# Patient Record
Sex: Female | Born: 1955
Health system: Southern US, Community
[De-identification: ages and names within clinical notes are randomized; demographics above are authoritative.]

## PROBLEM LIST (undated history)

## (undated) DIAGNOSIS — Z923 Personal history of irradiation: Secondary | ICD-10-CM

## (undated) DIAGNOSIS — E210 Primary hyperparathyroidism: Secondary | ICD-10-CM

## (undated) DIAGNOSIS — I10 Essential (primary) hypertension: Secondary | ICD-10-CM

## (undated) DIAGNOSIS — R911 Solitary pulmonary nodule: Secondary | ICD-10-CM

## (undated) DIAGNOSIS — R011 Cardiac murmur, unspecified: Secondary | ICD-10-CM

## (undated) DIAGNOSIS — R079 Chest pain, unspecified: Secondary | ICD-10-CM

## (undated) DIAGNOSIS — C50919 Malignant neoplasm of unspecified site of unspecified female breast: Secondary | ICD-10-CM

## (undated) DIAGNOSIS — R319 Hematuria, unspecified: Secondary | ICD-10-CM

## (undated) DIAGNOSIS — Z8489 Family history of other specified conditions: Secondary | ICD-10-CM

## (undated) DIAGNOSIS — I5189 Other ill-defined heart diseases: Secondary | ICD-10-CM

## (undated) DIAGNOSIS — R0600 Dyspnea, unspecified: Secondary | ICD-10-CM

## (undated) DIAGNOSIS — I2510 Atherosclerotic heart disease of native coronary artery without angina pectoris: Secondary | ICD-10-CM

## (undated) DIAGNOSIS — Z7982 Long term (current) use of aspirin: Secondary | ICD-10-CM

## (undated) DIAGNOSIS — I471 Supraventricular tachycardia, unspecified: Secondary | ICD-10-CM

## (undated) DIAGNOSIS — D696 Thrombocytopenia, unspecified: Secondary | ICD-10-CM

## (undated) DIAGNOSIS — I421 Obstructive hypertrophic cardiomyopathy: Secondary | ICD-10-CM

## (undated) DIAGNOSIS — I517 Cardiomegaly: Secondary | ICD-10-CM

## (undated) DIAGNOSIS — R9389 Abnormal findings on diagnostic imaging of other specified body structures: Secondary | ICD-10-CM

## (undated) DIAGNOSIS — R002 Palpitations: Secondary | ICD-10-CM

## (undated) DIAGNOSIS — E7800 Pure hypercholesterolemia, unspecified: Secondary | ICD-10-CM

## (undated) DIAGNOSIS — F419 Anxiety disorder, unspecified: Secondary | ICD-10-CM

## (undated) DIAGNOSIS — N882 Stricture and stenosis of cervix uteri: Secondary | ICD-10-CM

## (undated) DIAGNOSIS — I341 Nonrheumatic mitral (valve) prolapse: Secondary | ICD-10-CM

## (undated) DIAGNOSIS — I4510 Unspecified right bundle-branch block: Secondary | ICD-10-CM

## (undated) DIAGNOSIS — I4729 Other ventricular tachycardia: Secondary | ICD-10-CM

## (undated) DIAGNOSIS — R7989 Other specified abnormal findings of blood chemistry: Secondary | ICD-10-CM

## (undated) HISTORY — DX: Hematuria, unspecified: R31.9

## (undated) HISTORY — DX: Chest pain, unspecified: R07.9

## (undated) HISTORY — DX: Essential (primary) hypertension: I10

## (undated) HISTORY — DX: Cardiomegaly: I51.7

## (undated) HISTORY — DX: Solitary pulmonary nodule: R91.1

## (undated) HISTORY — PX: FACIAL FRACTURE SURGERY: SHX1570

## (undated) HISTORY — DX: Atherosclerotic heart disease of native coronary artery without angina pectoris: I25.10

## (undated) HISTORY — DX: Obstructive hypertrophic cardiomyopathy: I42.1

## (undated) HISTORY — DX: Cardiac murmur, unspecified: R01.1

## (undated) HISTORY — PX: BREAST LUMPECTOMY: SHX2

## (undated) HISTORY — DX: Pure hypercholesterolemia, unspecified: E78.00

## (undated) HISTORY — DX: Palpitations: R00.2

## (undated) HISTORY — PX: NO PAST SURGERIES: SHX2092

---

## 2005-02-27 ENCOUNTER — Ambulatory Visit: Payer: Self-pay | Admitting: Internal Medicine

## 2007-01-01 ENCOUNTER — Ambulatory Visit: Payer: Self-pay | Admitting: Internal Medicine

## 2008-01-12 ENCOUNTER — Ambulatory Visit: Payer: Self-pay | Admitting: Internal Medicine

## 2008-01-15 ENCOUNTER — Ambulatory Visit: Payer: Self-pay | Admitting: Internal Medicine

## 2009-03-07 ENCOUNTER — Ambulatory Visit: Payer: Self-pay | Admitting: Internal Medicine

## 2009-04-11 ENCOUNTER — Ambulatory Visit: Payer: Self-pay | Admitting: Internal Medicine

## 2010-03-21 ENCOUNTER — Ambulatory Visit: Payer: Self-pay | Admitting: Internal Medicine

## 2010-04-02 ENCOUNTER — Ambulatory Visit: Payer: Self-pay | Admitting: Internal Medicine

## 2010-11-27 ENCOUNTER — Ambulatory Visit: Payer: Self-pay | Admitting: Internal Medicine

## 2011-03-26 ENCOUNTER — Ambulatory Visit: Payer: Self-pay | Admitting: Internal Medicine

## 2012-06-19 ENCOUNTER — Other Ambulatory Visit: Payer: Self-pay | Admitting: Internal Medicine

## 2012-06-19 MED ORDER — HYDROCHLOROTHIAZIDE 25 MG PO TABS: 25.0000 mg | ORAL_TABLET | Freq: Every day | ORAL | Status: DC

## 2012-06-19 MED ORDER — SERTRALINE HCL 25 MG PO TABS: 25.0000 mg | ORAL_TABLET | Freq: Every day | ORAL | Status: DC

## 2012-06-19 NOTE — Telephone Encounter (Signed)
Sent in to pharmacy.  

## 2012-06-19 NOTE — Telephone Encounter (Signed)
Pt is needing refill on Zoloft 25 mg and hydrcholothorizide 25 mg. She uses [facility] in [place]. Pt is completely out of meds. Pt says pharmacy says they have tried contacting us twice already.

## 2012-08-31 ENCOUNTER — Encounter: Payer: Self-pay | Admitting: *Deleted

## 2012-09-01 ENCOUNTER — Ambulatory Visit (INDEPENDENT_AMBULATORY_CARE_PROVIDER_SITE_OTHER): Payer: No Typology Code available for payment source | Admitting: Internal Medicine

## 2012-09-01 ENCOUNTER — Other Ambulatory Visit (HOSPITAL_COMMUNITY)
Admission: RE | Admit: 2012-09-01 | Discharge: 2012-09-01 | Disposition: A | Discharge status: Home / Self Care | Payer: No Typology Code available for payment source | Source: Ambulatory Visit | Attending: Internal Medicine | Admitting: Internal Medicine

## 2012-09-01 ENCOUNTER — Encounter: Payer: Self-pay | Admitting: Internal Medicine

## 2012-09-01 VITALS — BP 122/74 | HR 81 | Temp 98.7°F | Ht 64.16 in | Wt 170.2 lb

## 2012-09-01 DIAGNOSIS — E7800 Pure hypercholesterolemia, unspecified: Secondary | ICD-10-CM

## 2012-09-01 DIAGNOSIS — Z01419 Encounter for gynecological examination (general) (routine) without abnormal findings: Secondary | ICD-10-CM | POA: Insufficient documentation

## 2012-09-01 DIAGNOSIS — I10 Essential (primary) hypertension: Secondary | ICD-10-CM

## 2012-09-01 DIAGNOSIS — Z1151 Encounter for screening for human papillomavirus (HPV): Secondary | ICD-10-CM | POA: Insufficient documentation

## 2012-09-01 MED ORDER — HYDROCHLOROTHIAZIDE 25 MG PO TABS: 25.0000 mg | ORAL_TABLET | Freq: Every day | ORAL | Status: DC

## 2012-09-01 MED ORDER — SERTRALINE HCL 25 MG PO TABS: 25.0000 mg | ORAL_TABLET | Freq: Every day | ORAL | Status: DC

## 2012-09-09 ENCOUNTER — Encounter: Payer: Self-pay | Admitting: Internal Medicine

## 2012-09-11 ENCOUNTER — Other Ambulatory Visit (INDEPENDENT_AMBULATORY_CARE_PROVIDER_SITE_OTHER): Payer: No Typology Code available for payment source

## 2012-09-11 DIAGNOSIS — R5383 Other fatigue: Secondary | ICD-10-CM

## 2012-09-11 DIAGNOSIS — R5381 Other malaise: Secondary | ICD-10-CM

## 2012-09-11 DIAGNOSIS — E7800 Pure hypercholesterolemia, unspecified: Secondary | ICD-10-CM

## 2012-09-11 LAB — CBC WITH DIFFERENTIAL/PLATELET
Basophils Relative: 0.7 % (ref 0.0–3.0)
Eosinophils Absolute: 0.1 10*3/uL (ref 0.0–0.7)
HCT: 43 % (ref 36.0–46.0)
Hemoglobin: 14.8 g/dL (ref 12.0–15.0)
Lymphs Abs: 3 10*3/uL (ref 0.7–4.0)
MCHC: 34.3 g/dL (ref 30.0–36.0)
MCV: 88 fl (ref 78.0–100.0)
Monocytes Absolute: 0.5 10*3/uL (ref 0.1–1.0)
Neutro Abs: 5.2 10*3/uL (ref 1.4–7.7)
Neutrophils Relative %: 59.1 % (ref 43.0–77.0)
RBC: 4.89 Mil/uL (ref 3.87–5.11)

## 2012-09-11 LAB — COMPREHENSIVE METABOLIC PANEL
AST: 23 U/L (ref 0–37)
Alkaline Phosphatase: 81 U/L (ref 39–117)
BUN: 13 mg/dL (ref 6–23)
Creatinine, Ser: 0.9 mg/dL (ref 0.4–1.2)

## 2012-09-11 LAB — TSH: TSH: 1.46 u[IU]/mL (ref 0.35–5.50)

## 2012-09-11 LAB — LIPID PANEL
Cholesterol: 187 mg/dL (ref 0–200)
LDL Cholesterol: 129 mg/dL — ABNORMAL HIGH (ref 0–99)
Triglycerides: 119 mg/dL (ref 0.0–149.0)
VLDL: 23.8 mg/dL (ref 0.0–40.0)

## 2012-09-16 ENCOUNTER — Ambulatory Visit: Payer: Self-pay | Admitting: Internal Medicine

## 2012-09-17 ENCOUNTER — Other Ambulatory Visit: Payer: Self-pay | Admitting: Internal Medicine

## 2012-09-17 DIAGNOSIS — E7800 Pure hypercholesterolemia, unspecified: Secondary | ICD-10-CM

## 2012-09-17 DIAGNOSIS — R739 Hyperglycemia, unspecified: Secondary | ICD-10-CM

## 2012-09-17 DIAGNOSIS — I10 Essential (primary) hypertension: Secondary | ICD-10-CM

## 2012-09-17 NOTE — Progress Notes (Signed)
Order placed for follow up labs 

## 2012-09-18 ENCOUNTER — Telehealth: Payer: Self-pay | Admitting: Internal Medicine

## 2012-09-18 NOTE — Telephone Encounter (Signed)
Patient called stating that the pharmacy did not receive her medication refill. She is wanting them refilled to day.  sertraline (ZOLOFT) 25 MG tablet   hydrochlorothiazide (HYDRODIURIL) 25 MG tablet

## 2012-09-18 NOTE — Telephone Encounter (Signed)
Called pharmacy who stated that they have received rx request. Patient notified.

## 2012-10-01 ENCOUNTER — Encounter: Payer: Self-pay | Admitting: Internal Medicine

## 2012-10-07 NOTE — Telephone Encounter (Signed)
Please close encounter

## 2012-10-11 ENCOUNTER — Encounter: Payer: Self-pay | Admitting: Internal Medicine

## 2012-10-11 DIAGNOSIS — E7800 Pure hypercholesterolemia, unspecified: Secondary | ICD-10-CM | POA: Insufficient documentation

## 2012-10-11 DIAGNOSIS — R319 Hematuria, unspecified: Secondary | ICD-10-CM | POA: Insufficient documentation

## 2012-10-11 DIAGNOSIS — I10 Essential (primary) hypertension: Secondary | ICD-10-CM | POA: Insufficient documentation

## 2012-10-11 NOTE — Assessment & Plan Note (Signed)
Blood pressure as outlined.  Doing well.  Continue current med regimen.  Check metabolic panel.   

## 2012-10-11 NOTE — Assessment & Plan Note (Signed)
Low cholesterol diet and exercise.  Check lipid panel.   

## 2012-10-11 NOTE — Progress Notes (Signed)
  Subjective:    [name], female    DOB: 03-12-1956, 57 y.o.   MRN: [id number]  HPI 57 year old female with past history of hematuria, hypercholesterolemia and hypertension who comes in today to follow up on these issues as well as for a complete physical exam.  States she is doing relatively well.  Handling stress relatively well.  On Sertraline.  Does not feel she needs anything more. Tries to stay active.  No cardiac symptoms with increased activity or exertion.  No nausea or vomiting. No abdominal pain.  Eating and drinking well.  Taking her meds regularly.    Past Medical History  Diagnosis Date  . Hypercholesterolemia   . Hypertension   . Hematuria     Review of Systems Patient denies any headache, lightheadedness or dizziness.  States she previously noticed a sudden pressure in her right ear.  Not bothering her now.  No ear pain.  No significant sinus or allergy symptoms.   No chest pain, tightness or palpitations.  No increased shortness of breath, cough or congestion.  No nausea or vomiting.  No abdominal pain or cramping.  No bowel change, such as diarrhea, constipation, BRBPR or melana.  No urine change.  Feels she is handling stress relatively well.  On sertraline. Does not feels she needs anything more.      Objective:   Physical Exam Filed Vitals:   09/01/12 1431  BP: 122/74  Pulse: 81  Temp: 98.7 F (28.70 C)   57 year old female in no acute distress.   HEENT:  Nares- clear.  Oropharynx - without lesions.   TMs visualized - without erythema.  NECK:  Supple.  Nontender.  No audible bruit.  HEART:  Appears to be regular. LUNGS:  No crackles or wheezing audible.  Respirations even and unlabored.  RADIAL PULSE:  Equal bilaterally.    BREASTS:  No nipple discharge or nipple retraction present.  Could not appreciate any distinct nodules or axillary adenopathy.  ABDOMEN:  Soft, nontender.  Bowel sounds present and normal.  No audible abdominal bruit.  GU:   Normal external genitalia.  Vaginal vault without lesions.  Cervix identified.  Pap performed. Could not appreciate any adnexal masses or tenderness.   RECTAL:  Heme negative.   EXTREMITIES:  No increased edema present.  DP pulses palpable and equal bilaterally.           Assessment & Plan:  PREVIOUS EAR PRESSURE.  Resolved.  No abnormality noted on exam.  Follow.   INCREASED PSYCHOSOCIAL STRESSORS.  Feels she is handling things relatively well.  On Sertraline.  Desires not change or any further intervention.  Follow.   CARDIOVASCULAR.  Asymptomatic.   HEALTH MAINTENANCE.  Physical today.  Schedule mammogram.  Will notify me when agreeable for colonoscopy.  Discussed with her today. IFOB.

## 2012-10-11 NOTE — Assessment & Plan Note (Signed)
Worked up by Dr Wolff at [facility].  Last visit there recommended follow up prn.  He reported urine negative.  Follow urinalysis.      

## 2013-03-14 ENCOUNTER — Other Ambulatory Visit: Payer: Self-pay | Admitting: Internal Medicine

## 2013-03-14 MED ORDER — HYDROCHLOROTHIAZIDE 25 MG PO TABS: 25.0000 mg | ORAL_TABLET | Freq: Every day | ORAL | Status: DC

## 2013-03-14 MED ORDER — SERTRALINE HCL 25 MG PO TABS: 25.0000 mg | ORAL_TABLET | Freq: Every day | ORAL | Status: DC

## 2013-03-14 NOTE — Progress Notes (Signed)
Refilled hctz #30 with 5 refills.  Refilled sertraline #30 with 5 refills.

## 2013-03-30 ENCOUNTER — Other Ambulatory Visit (INDEPENDENT_AMBULATORY_CARE_PROVIDER_SITE_OTHER): Payer: No Typology Code available for payment source

## 2013-03-30 DIAGNOSIS — E7800 Pure hypercholesterolemia, unspecified: Secondary | ICD-10-CM

## 2013-03-30 DIAGNOSIS — R7309 Other abnormal glucose: Secondary | ICD-10-CM

## 2013-03-30 DIAGNOSIS — R739 Hyperglycemia, unspecified: Secondary | ICD-10-CM

## 2013-03-30 DIAGNOSIS — I10 Essential (primary) hypertension: Secondary | ICD-10-CM

## 2013-03-30 DIAGNOSIS — R319 Hematuria, unspecified: Secondary | ICD-10-CM

## 2013-03-30 LAB — BASIC METABOLIC PANEL
BUN: 16 mg/dL (ref 6–23)
CO2: 28 mEq/L (ref 19–32)
Calcium: 9.8 mg/dL (ref 8.4–10.5)
Creatinine, Ser: 0.9 mg/dL (ref 0.4–1.2)
Glucose, Bld: 100 mg/dL — ABNORMAL HIGH (ref 70–99)
Sodium: 141 mEq/L (ref 135–145)

## 2013-03-30 LAB — URINALYSIS, ROUTINE W REFLEX MICROSCOPIC
Nitrite: NEGATIVE
Specific Gravity, Urine: 1.025 (ref 1.000–1.030)
Total Protein, Urine: NEGATIVE

## 2013-03-30 LAB — LIPID PANEL: HDL: 37.5 mg/dL — ABNORMAL LOW (ref >39.00)

## 2013-03-31 ENCOUNTER — Encounter: Payer: Self-pay | Admitting: *Deleted

## 2013-03-31 ENCOUNTER — Other Ambulatory Visit: Payer: Self-pay | Admitting: Internal Medicine

## 2013-03-31 DIAGNOSIS — R319 Hematuria, unspecified: Secondary | ICD-10-CM

## 2013-03-31 NOTE — Progress Notes (Signed)
Order placed for f/u urinalysis 

## 2013-08-17 ENCOUNTER — Telehealth: Payer: Self-pay | Admitting: Internal Medicine

## 2013-08-17 NOTE — Telephone Encounter (Signed)
She can try mucinex DM in the am and Robitussin DM in the evening.  This should help with the congestion and the cough.  Saline nasal spray - flush nose at least 2-3x/day.  Can also call in flonase nasal spray - 2 sprays each nostril q day (do this in the evening).  If persistent symptoms, will need to be evaluated.

## 2013-08-17 NOTE — Telephone Encounter (Signed)
Pt notified & stated that she already has some Flonase on hand

## 2013-08-17 NOTE — Telephone Encounter (Signed)
Patient Information:  Caller Name: Skila  Phone: [phone]  Patient: Mary Walter  Gender: Female  DOB: 1955/11/09  Age: 58 Years  PCP: Einar Pheasant  Office Follow Up:  Does the office need to follow up with this patient?: Yes  Instructions For The Office: Please contact patient to give further instructions.  RN Note:  Patient reports she cannot get into the office today. She does not have transportation. She would like to know if there is anything that can be called in for her symptoms. Please contact patient at the number provided.  Symptoms  Reason For Call & Symptoms: Sinus pain/pressure, dry cough, eyes are painful at times.  Reviewed Health History In EMR: Yes  Reviewed Medications In EMR: Yes  Reviewed Allergies In EMR: Yes  Reviewed Surgeries / Procedures: Yes  Date of Onset of Symptoms: 08/14/2013  Treatments Tried: Nasal decongestant and sinus  Treatments Tried Worked: No  Guideline(s) Used:  Sinus Pain and Congestion  Disposition Per Guideline:   See Today or Tomorrow in Office  Reason For Disposition Reached:   Patient wants to be seen  Advice Given:  N/A  Patient Refused Recommendation:  Patient Requests Prescription  Patient has no transportation to office today.

## 2013-08-17 NOTE — Telephone Encounter (Signed)
Please advise 

## 2013-09-02 ENCOUNTER — Ambulatory Visit (INDEPENDENT_AMBULATORY_CARE_PROVIDER_SITE_OTHER): Payer: No Typology Code available for payment source | Admitting: Internal Medicine

## 2013-09-02 ENCOUNTER — Encounter: Payer: Self-pay | Admitting: Internal Medicine

## 2013-09-02 ENCOUNTER — Other Ambulatory Visit (HOSPITAL_COMMUNITY)
Admission: RE | Admit: 2013-09-02 | Discharge: 2013-09-02 | Disposition: A | Discharge status: Home / Self Care | Payer: No Typology Code available for payment source | Source: Ambulatory Visit | Attending: Internal Medicine | Admitting: Internal Medicine

## 2013-09-02 VITALS — BP 120/70 | HR 70 | Temp 98.5°F | Ht 65.0 in | Wt 171.8 lb

## 2013-09-02 DIAGNOSIS — Z1151 Encounter for screening for human papillomavirus (HPV): Secondary | ICD-10-CM | POA: Insufficient documentation

## 2013-09-02 DIAGNOSIS — E7800 Pure hypercholesterolemia, unspecified: Secondary | ICD-10-CM

## 2013-09-02 DIAGNOSIS — Z124 Encounter for screening for malignant neoplasm of cervix: Secondary | ICD-10-CM

## 2013-09-02 DIAGNOSIS — I10 Essential (primary) hypertension: Secondary | ICD-10-CM

## 2013-09-02 DIAGNOSIS — R319 Hematuria, unspecified: Secondary | ICD-10-CM

## 2013-09-02 DIAGNOSIS — Z01419 Encounter for gynecological examination (general) (routine) without abnormal findings: Secondary | ICD-10-CM | POA: Insufficient documentation

## 2013-09-02 DIAGNOSIS — Z1239 Encounter for other screening for malignant neoplasm of breast: Secondary | ICD-10-CM

## 2013-09-02 DIAGNOSIS — Z1211 Encounter for screening for malignant neoplasm of colon: Secondary | ICD-10-CM

## 2013-09-02 MED ORDER — AZITHROMYCIN 250 MG PO TABS: ORAL_TABLET | ORAL | Status: DC

## 2013-09-02 NOTE — Progress Notes (Signed)
Subjective:    [name], female    DOB: 01/23/1956, 58 y.o.   MRN: [id number]  HPI 58 year old female with past history of hematuria, hypercholesterolemia and hypertension who comes in today to follow up on these issues as well as for a complete physical exam.  States she is doing relatively well.  Handling stress relatively well.  On Sertraline.  Does not feel she needs anything more. Tries to stay active.  No cardiac symptoms with increased activity or exertion.  No nausea or vomiting. No abdominal pain.  Eating and drinking well.  Taking her meds regularly.  Has been having some increased sinus pressure and cough/congestion.  Clear mucus.  Better now.  Taking some mucinex.    Past Medical History  Diagnosis Date  . Hypercholesterolemia   . Hypertension   . Hematuria     Current Outpatient Prescriptions on File Prior to Visit  Medication Sig Dispense Refill  . hydrochlorothiazide (HYDRODIURIL) 25 MG tablet Take 1 tablet (25 mg total) by mouth daily.  30 tablet  5  . Multiple Vitamin (MULTIVITAMIN) tablet Take 1 tablet by mouth daily.      . sertraline (ZOLOFT) 25 MG tablet Take 1 tablet (25 mg total) by mouth daily.  30 tablet  5   No current facility-administered medications on file prior to visit.    Review of Systems Patient denies any headache, lightheadedness or dizziness.  Some sinus congestion as outlined.  Clear mucus.  Better.  Using mucinex.  No ear pain.  No chest pain, tightness or palpitations.  No increased shortness of breath, cough or congestion.  No nausea or vomiting.  No abdominal pain or cramping.  No bowel change, such as diarrhea, constipation, BRBPR or melana.  No urine change.  Feels she is handling stress relatively well.  On sertraline.  Does not feels she needs anything more.      Objective:   Physical Exam  Filed Vitals:   09/02/13 0832  BP: 120/70  Pulse: 70  Temp: 98.5 F (38.59 C)   58 year old female in no acute distress.    HEENT:  Nares- clear.  Oropharynx - without lesions.   TMs visualized - without erythema.  NECK:  Supple.  Nontender.  No audible bruit.  HEART:  Appears to be regular. LUNGS:  No crackles or wheezing audible.  Respirations even and unlabored.  RADIAL PULSE:  Equal bilaterally.    BREASTS:  No nipple discharge or nipple retraction present.  Could not appreciate any distinct nodules or axillary adenopathy.  ABDOMEN:  Soft, nontender.  Bowel sounds present and normal.  No audible abdominal bruit.  GU:  Normal external genitalia.  Vaginal vault without lesions.  Cervix identified.  Pap performed. Could not appreciate any adnexal masses or tenderness.   RECTAL:  Heme negative.   EXTREMITIES:  No increased edema present.  DP pulses palpable and equal bilaterally.           Assessment & Plan:  PREVIOUS EAR PRESSURE.  Resolved.  No abnormality noted on exam.  Follow.   SINUS CONGESTION.  Mucinex as outlined.  Saline nasal flushes as directed.  Follow.  Do not feel abx warranted at this time.  Follow.    INCREASED PSYCHOSOCIAL STRESSORS.  Feels she is handling things relatively well.  On Sertraline.  Desires no further intervention at this time.    Follow.   CARDIOVASCULAR.  Asymptomatic.   HEALTH MAINTENANCE.  Physical today.  Schedule mammogram.  Will notify me when agreeable for colonoscopy.  Discussed with her today. IFOB.

## 2013-09-02 NOTE — Progress Notes (Signed)
Pre-visit discussion using our clinic review tool. No additional management support is needed unless otherwise documented below in the visit note.  

## 2013-09-05 ENCOUNTER — Encounter: Payer: Self-pay | Admitting: Internal Medicine

## 2013-09-05 NOTE — Assessment & Plan Note (Signed)
Worked up by Dr Yves Dill at [facility].  Last visit there recommended follow up prn.  He reported urine negative.  Follow urinalysis.  Check urinalysis with next labs.

## 2013-09-05 NOTE — Assessment & Plan Note (Signed)
Low cholesterol diet and exercise.  Follow lipid panel.   

## 2013-09-05 NOTE — Assessment & Plan Note (Signed)
Blood pressure as outlined.  Doing well.  Continue current med regimen.  Check metabolic panel.   

## 2013-09-06 ENCOUNTER — Encounter: Payer: Self-pay | Admitting: *Deleted

## 2013-09-16 ENCOUNTER — Encounter: Payer: Self-pay | Admitting: Emergency Medicine

## 2013-09-17 ENCOUNTER — Other Ambulatory Visit (INDEPENDENT_AMBULATORY_CARE_PROVIDER_SITE_OTHER): Payer: No Typology Code available for payment source

## 2013-09-17 DIAGNOSIS — E7800 Pure hypercholesterolemia, unspecified: Secondary | ICD-10-CM

## 2013-09-17 DIAGNOSIS — R319 Hematuria, unspecified: Secondary | ICD-10-CM

## 2013-09-17 DIAGNOSIS — I10 Essential (primary) hypertension: Secondary | ICD-10-CM

## 2013-09-17 LAB — URINALYSIS, ROUTINE W REFLEX MICROSCOPIC
Bilirubin Urine: NEGATIVE
Ketones, ur: NEGATIVE
Nitrite: NEGATIVE
PH: 7 (ref 5.0–8.0)
Specific Gravity, Urine: 1.015 (ref 1.000–1.030)
Total Protein, Urine: NEGATIVE
URINE GLUCOSE: NEGATIVE
UROBILINOGEN UA: 0.2 (ref 0.0–1.0)

## 2013-09-17 LAB — COMPREHENSIVE METABOLIC PANEL
ALT: 20 U/L (ref 0–35)
AST: 19 U/L (ref 0–37)
Albumin: 4 g/dL (ref 3.5–5.2)
Alkaline Phosphatase: 75 U/L (ref 39–117)
BILIRUBIN TOTAL: 0.6 mg/dL (ref 0.3–1.2)
BUN: 15 mg/dL (ref 6–23)
CO2: 27 mEq/L (ref 19–32)
CREATININE: 0.9 mg/dL (ref 0.4–1.2)
Calcium: 10.2 mg/dL (ref 8.4–10.5)
Chloride: 109 mEq/L (ref 96–112)
GFR: 69.04 mL/min (ref >60.00)
Glucose, Bld: 91 mg/dL (ref 70–99)
Potassium: 4 mEq/L (ref 3.5–5.1)
SODIUM: 143 meq/L (ref 135–145)
Total Protein: 6.3 g/dL (ref 6.0–8.3)

## 2013-09-17 LAB — CBC WITH DIFFERENTIAL/PLATELET
Basophils Absolute: 0.1 10*3/uL (ref 0.0–0.1)
Basophils Relative: 0.9 % (ref 0.0–3.0)
EOS PCT: 1.9 % (ref 0.0–5.0)
Eosinophils Absolute: 0.1 10*3/uL (ref 0.0–0.7)
HCT: 40.7 % (ref 36.0–46.0)
Hemoglobin: 13.9 g/dL (ref 12.0–15.0)
Lymphocytes Relative: 40.9 % (ref 12.0–46.0)
Lymphs Abs: 2.6 10*3/uL (ref 0.7–4.0)
MCHC: 34.1 g/dL (ref 30.0–36.0)
MCV: 89.4 fl (ref 78.0–100.0)
MONOS PCT: 6.8 % (ref 3.0–12.0)
Monocytes Absolute: 0.4 10*3/uL (ref 0.1–1.0)
NEUTROS PCT: 49.5 % (ref 43.0–77.0)
Neutro Abs: 3.2 10*3/uL (ref 1.4–7.7)
Platelets: 220 10*3/uL (ref 150.0–400.0)
RBC: 4.55 Mil/uL (ref 3.87–5.11)
RDW: 13.2 % (ref 11.5–14.6)
WBC: 6.4 10*3/uL (ref 4.5–10.5)

## 2013-09-17 LAB — LIPID PANEL
CHOL/HDL RATIO: 5
Cholesterol: 200 mg/dL (ref 0–200)
HDL: 40.8 mg/dL (ref >39.00)
LDL Cholesterol: 128 mg/dL — ABNORMAL HIGH (ref 0–99)
TRIGLYCERIDES: 156 mg/dL — AB (ref 0.0–149.0)
VLDL: 31.2 mg/dL (ref 0.0–40.0)

## 2013-09-17 LAB — TSH: TSH: 1.92 u[IU]/mL (ref 0.35–5.50)

## 2013-09-30 ENCOUNTER — Ambulatory Visit: Payer: Self-pay | Admitting: Internal Medicine

## 2013-09-30 LAB — HM MAMMOGRAPHY

## 2013-10-06 ENCOUNTER — Telehealth: Payer: Self-pay | Admitting: *Deleted

## 2013-10-06 NOTE — Telephone Encounter (Signed)
Pt called requesting lab results from 09/17/13. Pt notified of results & made aware that we spoke with husband on 3/30 & asked him to have her call back. Copy of labs also mailed to patient & patient declined referral at this time.

## 2013-10-11 ENCOUNTER — Other Ambulatory Visit: Payer: Self-pay | Admitting: Internal Medicine

## 2013-11-19 ENCOUNTER — Other Ambulatory Visit: Payer: Self-pay | Admitting: Internal Medicine

## 2014-01-12 ENCOUNTER — Other Ambulatory Visit: Payer: Self-pay | Admitting: Internal Medicine

## 2014-01-12 NOTE — Telephone Encounter (Signed)
Last refill 6.24.15, last OV 3.12.15, next Ov 9.15.15.  Please advise refill.

## 2014-01-12 NOTE — Telephone Encounter (Signed)
Refilled zoloft #30 with 2 refills.   

## 2014-03-08 ENCOUNTER — Ambulatory Visit (INDEPENDENT_AMBULATORY_CARE_PROVIDER_SITE_OTHER): Payer: No Typology Code available for payment source | Admitting: Internal Medicine

## 2014-03-08 ENCOUNTER — Encounter: Payer: Self-pay | Admitting: Internal Medicine

## 2014-03-08 VITALS — BP 110/80 | HR 78 | Temp 98.8°F | Ht 65.0 in | Wt 170.5 lb

## 2014-03-08 DIAGNOSIS — Z23 Encounter for immunization: Secondary | ICD-10-CM

## 2014-03-08 DIAGNOSIS — R319 Hematuria, unspecified: Secondary | ICD-10-CM

## 2014-03-08 DIAGNOSIS — H00013 Hordeolum externum right eye, unspecified eyelid: Secondary | ICD-10-CM

## 2014-03-08 DIAGNOSIS — Z733 Stress, not elsewhere classified: Secondary | ICD-10-CM

## 2014-03-08 DIAGNOSIS — E7800 Pure hypercholesterolemia, unspecified: Secondary | ICD-10-CM

## 2014-03-08 DIAGNOSIS — I10 Essential (primary) hypertension: Secondary | ICD-10-CM

## 2014-03-08 DIAGNOSIS — F439 Reaction to severe stress, unspecified: Secondary | ICD-10-CM

## 2014-03-08 DIAGNOSIS — H00019 Hordeolum externum unspecified eye, unspecified eyelid: Secondary | ICD-10-CM

## 2014-03-08 LAB — LIPID PANEL
CHOL/HDL RATIO: 5
Cholesterol: 180 mg/dL (ref 0–200)
HDL: 36.9 mg/dL — ABNORMAL LOW (ref >39.00)
LDL Cholesterol: 126 mg/dL — ABNORMAL HIGH (ref 0–99)
NonHDL: 143.1
Triglycerides: 85 mg/dL (ref 0.0–149.0)
VLDL: 17 mg/dL (ref 0.0–40.0)

## 2014-03-08 LAB — COMPREHENSIVE METABOLIC PANEL
ALBUMIN: 4 g/dL (ref 3.5–5.2)
ALK PHOS: 74 U/L (ref 39–117)
ALT: 20 U/L (ref 0–35)
AST: 24 U/L (ref 0–37)
BUN: 13 mg/dL (ref 6–23)
CO2: 27 mEq/L (ref 19–32)
Calcium: 10.4 mg/dL (ref 8.4–10.5)
Chloride: 107 mEq/L (ref 96–112)
Creatinine, Ser: 0.9 mg/dL (ref 0.4–1.2)
GFR: 70.76 mL/min (ref >60.00)
Glucose, Bld: 85 mg/dL (ref 70–99)
POTASSIUM: 3.7 meq/L (ref 3.5–5.1)
SODIUM: 142 meq/L (ref 135–145)
Total Bilirubin: 0.8 mg/dL (ref 0.2–1.2)
Total Protein: 6.8 g/dL (ref 6.0–8.3)

## 2014-03-08 NOTE — Progress Notes (Signed)
  Subjective:    [name], female    DOB: May 01, 1956, 58 y.o.   MRN: [id number]  HPI 58 year old female with past history of hematuria, hypercholesterolemia and hypertension who comes in today for a scheduled follow up.  States she is doing relatively well.  Handling stress relatively well.  On Sertraline.  Does not feel she needs anything more. Tries to stay active.  No cardiac symptoms with increased activity or exertion.  We discussed diet and exercise.   No nausea or vomiting. No abdominal pain.  Eating and drinking well.  Taking her meds regularly.  Had some sinus issues over the summer.  These resolved.  Has a sty - left eye lid.  Using warm compresses.  Helping.     Past Medical History  Diagnosis Date  . Hypercholesterolemia   . Hypertension   . Hematuria     Current Outpatient Prescriptions on File Prior to Visit  Medication Sig Dispense Refill  . hydrochlorothiazide (HYDRODIURIL) 25 MG tablet TAKE 1 TABLET (25 MG TOTAL) BY MOUTH DAILY.  30 tablet  5  . Multiple Vitamin (MULTIVITAMIN) tablet Take 1 tablet by mouth daily.      . sertraline (ZOLOFT) 25 MG tablet TAKE 1 TABLET BY MOUTH EVERY DAY  30 tablet  2   No current facility-administered medications on file prior to visit.    Review of Systems Patient denies any headache, lightheadedness or dizziness.  Sinus congestion and pressure better.  Sty left eye as outlined.  No chest pain, tightness or palpitations.  No increased shortness of breath, cough or congestion.  No nausea or vomiting.  No abdominal pain or cramping.  No bowel change, such as diarrhea, constipation, BRBPR or melana.  No urine change.  Feels she is handling stress relatively well.  On sertraline.  Does not feels she needs anything more.      Objective:   Physical Exam  Filed Vitals:   03/08/14 0847  BP: 110/80  Pulse: 78  Temp: 98.8 F (51.83 C)   58 year old female in no acute distress.   HEENT:  Nares- clear.  Oropharynx - without  lesions.     NECK:  Supple.  Nontender.  No audible bruit.  HEART:  Appears to be regular. LUNGS:  No crackles or wheezing audible.  Respirations even and unlabored.  RADIAL PULSE:  Equal bilaterally.    ABDOMEN:  Soft, nontender.  Bowel sounds present and normal.  No audible abdominal bruit.    EXTREMITIES:  No increased edema present.  DP pulses palpable and equal bilaterally.           Assessment & Plan:  INCREASED PSYCHOSOCIAL STRESSORS.  Feels she is handling things relatively well.  On Sertraline.  Desires no further intervention at this time.    Follow.   CARDIOVASCULAR.  Asymptomatic.   HEALTH MAINTENANCE.  Physical 09/02/13.  Pap at physical negative with negative HPV.  Will notify me when agreeable for colonoscopy.  Discussed with her today.  Has not returned IFOB.  Mammogram 09/30/13 - Birads I.

## 2014-03-08 NOTE — Progress Notes (Signed)
Pre visit review using our clinic review tool, if applicable. No additional management support is needed unless otherwise documented below in the visit note. 

## 2014-03-09 ENCOUNTER — Encounter: Payer: Self-pay | Admitting: *Deleted

## 2014-03-13 ENCOUNTER — Encounter: Payer: Self-pay | Admitting: Internal Medicine

## 2014-03-13 DIAGNOSIS — H00019 Hordeolum externum unspecified eye, unspecified eyelid: Secondary | ICD-10-CM | POA: Insufficient documentation

## 2014-03-13 DIAGNOSIS — F439 Reaction to severe stress, unspecified: Secondary | ICD-10-CM | POA: Insufficient documentation

## 2014-03-13 NOTE — Assessment & Plan Note (Signed)
Low cholesterol diet and exercise.  Follow lipid panel.   

## 2014-03-13 NOTE — Assessment & Plan Note (Signed)
Increased stress.  On zoloft.  Feels she is handling things relatively well.  Does not feel she needs any further intervention.  Follow.

## 2014-03-13 NOTE — Assessment & Plan Note (Signed)
Worked up by Dr Yves Dill at [facility].  Last visit there recommended follow up prn.  He reported urine negative.  Follow urinalysis.

## 2014-03-13 NOTE — Assessment & Plan Note (Signed)
Blood pressure as outlined.  Doing well.  Continue current med regimen.  Check metabolic panel.

## 2014-03-13 NOTE — Assessment & Plan Note (Signed)
Right eyelid sty.  Warm compresses.  Follow.

## 2014-04-13 ENCOUNTER — Other Ambulatory Visit: Payer: Self-pay | Admitting: Internal Medicine

## 2014-07-19 ENCOUNTER — Other Ambulatory Visit: Payer: Self-pay | Admitting: Internal Medicine

## 2014-07-19 NOTE — Telephone Encounter (Signed)
Last OV 9.15.15, last refill 12.28.15, next OX 3.15.16.  Please advise refill

## 2014-07-19 NOTE — Telephone Encounter (Signed)
Refilled zoloft #30 with 2 refills.

## 2014-09-06 ENCOUNTER — Ambulatory Visit (INDEPENDENT_AMBULATORY_CARE_PROVIDER_SITE_OTHER): Payer: Managed Care, Other (non HMO) | Admitting: Internal Medicine

## 2014-09-06 ENCOUNTER — Encounter: Payer: Self-pay | Admitting: Internal Medicine

## 2014-09-06 ENCOUNTER — Other Ambulatory Visit (HOSPITAL_COMMUNITY)
Admission: RE | Admit: 2014-09-06 | Discharge: 2014-09-06 | Disposition: A | Discharge status: Home / Self Care | Payer: Managed Care, Other (non HMO) | Source: Ambulatory Visit | Attending: Internal Medicine | Admitting: Internal Medicine

## 2014-09-06 VITALS — BP 110/70 | HR 69 | Temp 98.9°F | Ht 66.0 in | Wt 176.0 lb

## 2014-09-06 DIAGNOSIS — Z658 Other specified problems related to psychosocial circumstances: Secondary | ICD-10-CM | POA: Diagnosis not present

## 2014-09-06 DIAGNOSIS — Z01419 Encounter for gynecological examination (general) (routine) without abnormal findings: Secondary | ICD-10-CM | POA: Diagnosis present

## 2014-09-06 DIAGNOSIS — R319 Hematuria, unspecified: Secondary | ICD-10-CM | POA: Diagnosis not present

## 2014-09-06 DIAGNOSIS — Z1151 Encounter for screening for human papillomavirus (HPV): Secondary | ICD-10-CM | POA: Diagnosis present

## 2014-09-06 DIAGNOSIS — E7800 Pure hypercholesterolemia, unspecified: Secondary | ICD-10-CM

## 2014-09-06 DIAGNOSIS — F439 Reaction to severe stress, unspecified: Secondary | ICD-10-CM

## 2014-09-06 DIAGNOSIS — Z0000 Encounter for general adult medical examination without abnormal findings: Secondary | ICD-10-CM | POA: Insufficient documentation

## 2014-09-06 DIAGNOSIS — I10 Essential (primary) hypertension: Secondary | ICD-10-CM

## 2014-09-06 LAB — COMPREHENSIVE METABOLIC PANEL
ALK PHOS: 75 U/L (ref 39–117)
ALT: 21 U/L (ref 0–35)
AST: 21 U/L (ref 0–37)
Albumin: 4.5 g/dL (ref 3.5–5.2)
BUN: 16 mg/dL (ref 6–23)
CO2: 31 mEq/L (ref 19–32)
CREATININE: 0.89 mg/dL (ref 0.40–1.20)
Calcium: 10.8 mg/dL — ABNORMAL HIGH (ref 8.4–10.5)
Chloride: 105 mEq/L (ref 96–112)
GFR: 68.81 mL/min (ref >60.00)
GLUCOSE: 88 mg/dL (ref 70–99)
Potassium: 3.4 mEq/L — ABNORMAL LOW (ref 3.5–5.1)
SODIUM: 140 meq/L (ref 135–145)
TOTAL PROTEIN: 7.1 g/dL (ref 6.0–8.3)
Total Bilirubin: 0.8 mg/dL (ref 0.2–1.2)

## 2014-09-06 LAB — LIPID PANEL
CHOL/HDL RATIO: 5
Cholesterol: 199 mg/dL (ref 0–200)
HDL: 38.6 mg/dL — AB (ref >39.00)
LDL CALC: 133 mg/dL — AB (ref 0–99)
NonHDL: 160.4
TRIGLYCERIDES: 136 mg/dL (ref 0.0–149.0)
VLDL: 27.2 mg/dL (ref 0.0–40.0)

## 2014-09-06 LAB — TSH: TSH: 1.65 u[IU]/mL (ref 0.35–4.50)

## 2014-09-06 LAB — CBC WITH DIFFERENTIAL/PLATELET
Basophils Absolute: 0.1 10*3/uL (ref 0.0–0.1)
Basophils Relative: 0.9 % (ref 0.0–3.0)
Eosinophils Absolute: 0.1 10*3/uL (ref 0.0–0.7)
Eosinophils Relative: 1.1 % (ref 0.0–5.0)
HCT: 42.8 % (ref 36.0–46.0)
Hemoglobin: 14.9 g/dL (ref 12.0–15.0)
LYMPHS PCT: 36.3 % (ref 12.0–46.0)
Lymphs Abs: 2.4 10*3/uL (ref 0.7–4.0)
MCHC: 34.9 g/dL (ref 30.0–36.0)
MCV: 87.4 fl (ref 78.0–100.0)
MONOS PCT: 6.2 % (ref 3.0–12.0)
Monocytes Absolute: 0.4 10*3/uL (ref 0.1–1.0)
NEUTROS ABS: 3.7 10*3/uL (ref 1.4–7.7)
Neutrophils Relative %: 55.5 % (ref 43.0–77.0)
PLATELETS: 239 10*3/uL (ref 150.0–400.0)
RBC: 4.9 Mil/uL (ref 3.87–5.11)
RDW: 13.2 % (ref 11.5–15.5)
WBC: 6.7 10*3/uL (ref 4.0–10.5)

## 2014-09-06 NOTE — Progress Notes (Signed)
[name], female   DOB: 1955/12/18, 59 y.o.   MRN: [id number]   Subjective:    [name], female    DOB: 08/05/1955, 59 y.o.   MRN: [id number]  HPI  Patient here for her physical exam.  States she is doing relatively well.  Increased stress with work and her family situation.  Feels she is coping relatively well.  Discussed at length with her today.  We discussed diet and exercise.  Breathing stable.  Bowels stable.    Past Medical History  Diagnosis Date  . Hypercholesterolemia   . Hypertension   . Hematuria     Current Outpatient Prescriptions on File Prior to Visit  Medication Sig Dispense Refill  . hydrochlorothiazide (HYDRODIURIL) 25 MG tablet TAKE 1 TABLET (25 MG TOTAL) BY MOUTH DAILY. 30 tablet 5  . Multiple Vitamin (MULTIVITAMIN) tablet Take 1 tablet by mouth daily.    . sertraline (ZOLOFT) 25 MG tablet TAKE ONE TABLET BY MOUTH ONCE DAILY 30 tablet 2   No current facility-administered medications on file prior to visit.    Review of Systems  Constitutional: Negative for appetite change and unexpected weight change.  HENT: Negative for congestion and sinus pressure.   Eyes: Negative for pain and visual disturbance.  Respiratory: Negative for cough, chest tightness and shortness of breath.   Cardiovascular: Negative for chest pain, palpitations and leg swelling.  Gastrointestinal: Negative for nausea, vomiting, abdominal pain and diarrhea.  Genitourinary: Negative for dysuria and difficulty urinating.  Musculoskeletal: Negative for back pain and joint swelling.  Skin: Negative for color change and rash.  Neurological: Negative for dizziness, light-headedness and headaches.  Psychiatric/Behavioral: Negative for dysphoric mood.       Increased stress with her family situation as outlined.         Objective:     Blood pressure recheck:  128/78  Physical Exam  Constitutional: She is oriented to person, place, and time. She  appears well-developed and well-nourished.  HENT:  Nose: Nose normal.  Mouth/Throat: Oropharynx is clear and moist.  Eyes: Right eye exhibits no discharge. Left eye exhibits no discharge. No scleral icterus.  Neck: Neck supple. No thyromegaly present.  Cardiovascular: Normal rate and regular rhythm.   Pulmonary/Chest: Breath sounds normal. No accessory muscle usage. No tachypnea. No respiratory distress. She has no decreased breath sounds. She has no wheezes. She has no rhonchi. Right breast exhibits no inverted nipple, no mass, no nipple discharge and no tenderness (no axillary adenopathy). Left breast exhibits no inverted nipple, no mass, no nipple discharge and no tenderness (no axilarry adenopathy).  Abdominal: Soft. Bowel sounds are normal. There is no tenderness.  Genitourinary:  Normal external genitalia.  Vaginal vault without lesions.  Cervix identified.  Pap smear performed.  Could not appreciate any adnexal masses or tenderness.    Musculoskeletal: She exhibits no edema or tenderness.  Lymphadenopathy:    She has no cervical adenopathy.  Neurological: She is alert and oriented to person, place, and time.  Skin: Skin is warm. No rash noted.  Psychiatric: She has a normal mood and affect. Her behavior is normal.    BP 110/70 mmHg  Pulse 69  Temp(Src) 98.9 F (37.2 C) (Oral)  Ht 5\' 6"  (1.676 m)  Wt 176 lb (79.833 kg)  BMI 28.42 kg/m2  SpO2 97%  LMP 06/24/2010 Wt Readings from Last 3 Encounters:  09/06/14 176 lb (79.833 kg)  03/08/14 170 lb 8 oz (77.338 kg)  09/02/13 171  lb 12 oz (77.905 kg)     Lab Results  Component Value Date   WBC 6.7 09/06/2014   HGB 14.9 09/06/2014   HCT 42.8 09/06/2014   PLT 239.0 09/06/2014   GLUCOSE 88 09/06/2014   CHOL 199 09/06/2014   TRIG 136.0 09/06/2014   HDL 38.60* 09/06/2014   LDLDIRECT 144.1 03/30/2013   LDLCALC 133* 09/06/2014   ALT 21 09/06/2014   AST 21 09/06/2014   NA 140 09/06/2014   K 3.4* 09/06/2014   CL 105 09/06/2014    CREATININE 0.89 09/06/2014   BUN 16 09/06/2014   CO2 31 09/06/2014   TSH 1.65 09/06/2014   HGBA1C 5.2 03/30/2013       Assessment & Plan:   Problem List Items Addressed This Visit    Essential hypertension, benign - Primary    Blood pressure as outlined.  Continue current medication regimen.  Follow pressures.  Follow metabolic panel.       Relevant Orders   Comprehensive metabolic panel (Completed)   Cytology - PAP (Completed)   Health care maintenance    Physical 09/06/14.  PAP 09/02/13 negative with negative HPV.  Mammogram 09/30/13 - birads I.  She will schedule her f/u mammogram. Needs colonoscopy.  Will let me know when agreeable.        Relevant Orders   Cytology - PAP (Completed)   Hematuria    Has been previously worked up by Dr Yves Dill.  Last urinalysis with persistent rbc's.  Discussed referral back to urology.  She declines at this time.  Recheck urinalysis with next labs.        Relevant Orders   CBC with Differential/Platelet (Completed)   Cytology - PAP (Completed)   Urinalysis, Routine w reflex microscopic   Hypercholesterolemia    Low cholesterol diet and exercise.  Check lipid panel with next labs.        Relevant Orders   Lipid panel (Completed)   Cytology - PAP (Completed)   Stress    Discussed at length with her today.  She feels she is coping relatively well.  On zoloft.  Follow.        Relevant Orders   TSH (Completed)   Cytology - PAP (Completed)     I spent 25 minutes with the patient and more than 50% of the time was spent in consultation regarding the above.     Einar Pheasant, MD

## 2014-09-06 NOTE — Progress Notes (Signed)
Pre visit review using our clinic review tool, if applicable. No additional management support is needed unless otherwise documented below in the visit note. 

## 2014-09-07 ENCOUNTER — Other Ambulatory Visit: Payer: Self-pay | Admitting: Internal Medicine

## 2014-09-07 DIAGNOSIS — E876 Hypokalemia: Secondary | ICD-10-CM

## 2014-09-07 NOTE — Progress Notes (Signed)
Order placed for f/u labs.  

## 2014-09-08 LAB — CYTOLOGY - PAP

## 2014-09-09 ENCOUNTER — Encounter: Payer: Self-pay | Admitting: *Deleted

## 2014-09-11 ENCOUNTER — Encounter: Payer: Self-pay | Admitting: Internal Medicine

## 2014-09-11 NOTE — Assessment & Plan Note (Signed)
Low cholesterol diet and exercise.  Check lipid panel with next labs.  

## 2014-09-11 NOTE — Assessment & Plan Note (Signed)
Blood pressure as outlined.  Continue current medication regimen.  Follow pressures.  Follow metabolic panel.  

## 2014-09-11 NOTE — Assessment & Plan Note (Signed)
Physical 09/06/14.  PAP 09/02/13 negative with negative HPV.  Mammogram 09/30/13 - birads I.  She will schedule her f/u mammogram. Needs colonoscopy.  Will let me know when agreeable.

## 2014-09-11 NOTE — Assessment & Plan Note (Signed)
Has been previously worked up by Dr Yves Dill.  Last urinalysis with persistent rbc's.  Discussed referral back to urology.  She declines at this time.  Recheck urinalysis with next labs.

## 2014-09-11 NOTE — Assessment & Plan Note (Signed)
Discussed at length with her today.  She feels she is coping relatively well.  On zoloft.  Follow.

## 2014-09-29 ENCOUNTER — Other Ambulatory Visit (INDEPENDENT_AMBULATORY_CARE_PROVIDER_SITE_OTHER): Payer: Managed Care, Other (non HMO)

## 2014-09-29 DIAGNOSIS — E876 Hypokalemia: Secondary | ICD-10-CM | POA: Diagnosis not present

## 2014-09-29 DIAGNOSIS — R319 Hematuria, unspecified: Secondary | ICD-10-CM | POA: Diagnosis not present

## 2014-09-29 LAB — URINALYSIS, ROUTINE W REFLEX MICROSCOPIC
BILIRUBIN URINE: NEGATIVE
Ketones, ur: NEGATIVE
NITRITE: NEGATIVE
PH: 6 (ref 5.0–8.0)
Specific Gravity, Urine: 1.02 (ref 1.000–1.030)
TOTAL PROTEIN, URINE-UPE24: NEGATIVE
Urine Glucose: NEGATIVE
Urobilinogen, UA: 0.2 (ref 0.0–1.0)

## 2014-09-29 LAB — POTASSIUM: Potassium: 3.6 mEq/L (ref 3.5–5.1)

## 2014-09-29 LAB — CALCIUM: CALCIUM: 10.3 mg/dL (ref 8.4–10.5)

## 2014-10-17 ENCOUNTER — Other Ambulatory Visit: Payer: Self-pay | Admitting: Internal Medicine

## 2014-10-20 ENCOUNTER — Other Ambulatory Visit: Payer: Self-pay | Admitting: Internal Medicine

## 2014-10-20 DIAGNOSIS — Z1231 Encounter for screening mammogram for malignant neoplasm of breast: Secondary | ICD-10-CM

## 2014-11-04 ENCOUNTER — Ambulatory Visit
Admission: RE | Admit: 2014-11-04 | Discharge: 2014-11-04 | Disposition: A | Discharge status: Home / Self Care | Payer: Managed Care, Other (non HMO) | Source: Ambulatory Visit | Attending: Internal Medicine | Admitting: Internal Medicine

## 2014-11-04 DIAGNOSIS — Z1231 Encounter for screening mammogram for malignant neoplasm of breast: Secondary | ICD-10-CM | POA: Diagnosis not present

## 2015-02-18 ENCOUNTER — Other Ambulatory Visit: Payer: Self-pay | Admitting: Internal Medicine

## 2015-02-20 NOTE — Telephone Encounter (Signed)
Ok to refill zoloft #30 with one refill - needs a f/u appt (16min) - scheduled.

## 2015-02-20 NOTE — Telephone Encounter (Signed)
Last OV 3.15.16.  Please advise refill

## 2015-04-17 ENCOUNTER — Telehealth: Payer: Self-pay | Admitting: Internal Medicine

## 2015-04-17 ENCOUNTER — Encounter: Payer: Self-pay | Admitting: Family Medicine

## 2015-04-17 ENCOUNTER — Ambulatory Visit (INDEPENDENT_AMBULATORY_CARE_PROVIDER_SITE_OTHER): Payer: Managed Care, Other (non HMO) | Admitting: Family Medicine

## 2015-04-17 VITALS — BP 142/80 | HR 67 | Temp 98.4°F | Ht 66.0 in | Wt 178.1 lb

## 2015-04-17 DIAGNOSIS — B029 Zoster without complications: Secondary | ICD-10-CM

## 2015-04-17 DIAGNOSIS — R21 Rash and other nonspecific skin eruption: Secondary | ICD-10-CM | POA: Diagnosis not present

## 2015-04-17 MED ORDER — VALACYCLOVIR HCL 1 G PO TABS: 1000.0000 mg | ORAL_TABLET | Freq: Two times a day (BID) | ORAL | Status: DC

## 2015-04-17 NOTE — Progress Notes (Signed)
   Subjective:  [name], female    DOB: Oct 10, 1955  Age: 59 y.o. MRN: [id number]  CC: Rash, concern for shingles  HPI:  59 year old female presents to clinic today for an acute visit with complaints of rash.  Rash  Located under the right breast.  Started on Friday following pain earlier in the week.  Rash is quite red. She reports associated pain.  She does have some itching.  She's been applying topical Aspercreme with some relief. She's also been taking Tylenol with some relief in her pain.  No other relieving factors.  No known exacerbating factors.  She is concerned that this may be shingles.  Social Hx   Social History   Social History  . Marital Status: Married    Spouse Name: N/A  . Number of Children: 2  . Years of Education: N/A   Social History Main Topics  . Smoking status: Never Smoker   . Smokeless tobacco: Never Used  . Alcohol Use: No  . Drug Use: No  . Sexual Activity: Not Asked   Other Topics Concern  . None   Social History Narrative   Review of Systems  Constitutional: Negative for fever.  Skin: Positive for rash.   Objective:  BP 142/80 mmHg  Pulse 67  Temp(Src) 98.4 F (36.9 C) (Oral)  Ht 5\' 6"  (1.676 m)  Wt 178 lb 2 oz (80.797 kg)  BMI 28.76 kg/m2  SpO2 97%  LMP 06/24/2010  BP/Weight 04/17/2015 09/06/2014 09/06/1759  Systolic BP 607 371 062  Diastolic BP 80 70 80  Wt. (Lbs) 178.13 176 170.5  BMI 28.76 28.42 28.37   Physical Exam  Constitutional: She is oriented to person, place, and time. She appears well-developed and well-nourished. No distress.  Pulmonary/Chest: Effort normal.  Neurological: She is alert and oriented to person, place, and time.  Skin:  Papular, erythematous rash located under the right breast.  Approximately 2 x 4 cm.  Psychiatric: She has a normal mood and affect.  Vitals reviewed.  Lab Results  Component Value Date   WBC 6.7 09/06/2014   HGB 14.9 09/06/2014   HCT 42.8  09/06/2014   PLT 239.0 09/06/2014   GLUCOSE 88 09/06/2014   CHOL 199 09/06/2014   TRIG 136.0 09/06/2014   HDL 38.60* 09/06/2014   LDLDIRECT 144.1 03/30/2013   LDLCALC 133* 09/06/2014   ALT 21 09/06/2014   AST 21 09/06/2014   NA 140 09/06/2014   K 3.6 09/29/2014   CL 105 09/06/2014   CREATININE 0.89 09/06/2014   BUN 16 09/06/2014   CO2 31 09/06/2014   TSH 1.65 09/06/2014   HGBA1C 5.2 03/30/2013   Assessment & Plan:   Problem List Items Addressed This Visit    Herpes zoster - Primary    Given history, appearance, and distribution this is likely Shingles. Treating empirically with Valtrex. Continued Tylenol and Topical agents for pain.       Relevant Medications   valACYclovir (VALTREX) 1000 MG tablet   RESOLVED: Rash     Meds ordered this encounter  Medications  . valACYclovir (VALTREX) 1000 MG tablet    Sig: Take 1 tablet (1,000 mg total) by mouth 2 (two) times daily.    Dispense:  21 tablet    Refill:  0    Follow-up: PRN  Thersa Salt, DO

## 2015-04-17 NOTE — Patient Instructions (Signed)
Take the antiviral as prescribed.  You can continue tylenol and the topical agent.  If you need something additional for pain please let me know.  Follow up as needed.  Take care  Dr Lacinda Axon

## 2015-04-17 NOTE — Assessment & Plan Note (Signed)
Given history, appearance, and distribution this is likely Shingles. Treating empirically with Valtrex. Continued Tylenol and Topical agents for pain.

## 2015-04-17 NOTE — Progress Notes (Signed)
Pre visit review using our clinic review tool, if applicable. No additional management support is needed unless otherwise documented below in the visit note. 

## 2015-04-17 NOTE — Assessment & Plan Note (Deleted)
Given history, appearance, and distribution this is likely Shingles. Treating empirically with Valtrex. Continued Tylenol and Topical agents for pain.

## 2015-04-19 ENCOUNTER — Other Ambulatory Visit: Payer: Self-pay | Admitting: Internal Medicine

## 2015-04-20 ENCOUNTER — Telehealth: Payer: Self-pay | Admitting: Internal Medicine

## 2015-04-20 MED ORDER — HYDROCODONE-ACETAMINOPHEN 5-325 MG PO TABS: 1.0000 | ORAL_TABLET | Freq: Three times a day (TID) | ORAL | Status: DC | PRN

## 2015-04-20 NOTE — Telephone Encounter (Signed)
Already addressed

## 2015-04-20 NOTE — Telephone Encounter (Signed)
Pt called stating that she was seen by Dr. Lacinda Axon for Shingles.Marland Kitchenand states that she needs something for the pain..please advise pt.

## 2015-04-20 NOTE — Telephone Encounter (Signed)
Called patient and let her know that her rx was at office for pick up

## 2015-04-20 NOTE — Telephone Encounter (Signed)
Saw Dr. Lacinda Axon on 10/24, please advise?

## 2015-04-20 NOTE — Telephone Encounter (Signed)
Patient can pick up the medication (I cannot send it in electronically).

## 2015-04-24 ENCOUNTER — Ambulatory Visit: Payer: Managed Care, Other (non HMO) | Admitting: Internal Medicine

## 2015-04-26 ENCOUNTER — Ambulatory Visit (INDEPENDENT_AMBULATORY_CARE_PROVIDER_SITE_OTHER): Payer: Managed Care, Other (non HMO) | Admitting: Internal Medicine

## 2015-04-26 ENCOUNTER — Encounter: Payer: Self-pay | Admitting: Internal Medicine

## 2015-04-26 VITALS — BP 118/80 | HR 60 | Temp 98.3°F | Resp 18 | Ht 66.0 in | Wt 176.0 lb

## 2015-04-26 DIAGNOSIS — B029 Zoster without complications: Secondary | ICD-10-CM | POA: Diagnosis not present

## 2015-04-26 DIAGNOSIS — Z658 Other specified problems related to psychosocial circumstances: Secondary | ICD-10-CM

## 2015-04-26 DIAGNOSIS — I10 Essential (primary) hypertension: Secondary | ICD-10-CM | POA: Diagnosis not present

## 2015-04-26 DIAGNOSIS — F439 Reaction to severe stress, unspecified: Secondary | ICD-10-CM

## 2015-04-26 DIAGNOSIS — K625 Hemorrhage of anus and rectum: Secondary | ICD-10-CM

## 2015-04-26 DIAGNOSIS — E7800 Pure hypercholesterolemia, unspecified: Secondary | ICD-10-CM

## 2015-04-26 MED ORDER — SERTRALINE HCL 50 MG PO TABS: 50.0000 mg | ORAL_TABLET | Freq: Every day | ORAL | Status: DC

## 2015-04-26 NOTE — Progress Notes (Signed)
[name], female   DOB: [birth year], 59 y.o.   MRN: [id number]   Subjective:    [name], female    DOB: 03-22-1956, 59 y.o.   MRN: [id number]  HPI  Patient with past history of hypercholesterolemia and hypertension who comes in today to follow up on these issues.  Recently diagnosed with shingles.  Rash improving.  Took valtrex.  Still with some discomfort - right breast.  Managing the pain.  Does not feel needs anything more at this point.  Taking ibuprofen and hydrocodone.  Trying to minimize the amount.  Reports increased stress with her family situation.  Feels needs to increase her zoloft.  Tries to stay active.  No cardiac symptoms with increased activity or exertion.  No sob.  No abdominal pain or cramping.  Has intermittently noticed some rectal bleeding.  Have discussed GI w/up.  She will call and schedule an appt.  She has declined previously.  Does not want [place] to schedule.     Past Medical History  Diagnosis Date  . Hypercholesterolemia   . Hypertension   . Hematuria    Past Surgical History  Procedure Laterality Date  . No past surgeries     Family History  Problem Relation Age of Onset  . Diabetes Mother   . Heart disease Mother   . Stroke Mother   . Hypertension Mother   . Cancer Father     prostate   . Diverticulitis Father   . CVA Father   . Heart disease Father   . Stroke Father   . Hypertension Father   . CVA Paternal Grandmother   . Breast cancer Cousin    Social History   Social History  . Marital Status: Married    Spouse Name: N/A  . Number of Children: 2  . Years of Education: N/A   Social History Main Topics  . Smoking status: Never Smoker   . Smokeless tobacco: Never Used  . Alcohol Use: No  . Drug Use: No  . Sexual Activity: Not Asked   Other Topics Concern  . None   Social History Narrative    Outpatient Encounter Prescriptions as of 04/26/2015  Medication Sig  . hydrochlorothiazide  (HYDRODIURIL) 25 MG tablet TAKE ONE TABLET BY MOUTH ONCE DAILY  . HYDROcodone-acetaminophen (NORCO/VICODIN) 5-325 MG tablet Take 1 tablet by mouth every 8 (eight) hours as needed for moderate pain.  . Multiple Vitamin (MULTIVITAMIN) tablet Take 1 tablet by mouth daily.  . valACYclovir (VALTREX) 1000 MG tablet Take 1 tablet (1,000 mg total) by mouth 2 (two) times daily.  . [DISCONTINUED] sertraline (ZOLOFT) 25 MG tablet TAKE ONE TABLET BY MOUTH ONCE DAILY  . sertraline (ZOLOFT) 50 MG tablet Take 1 tablet (50 mg total) by mouth daily.   No facility-administered encounter medications on file as of 04/26/2015.   ' Review of Systems  Constitutional: Negative for appetite change and unexpected weight change.  HENT: Negative for congestion and sinus pressure.   Respiratory: Negative for cough, chest tightness and shortness of breath.   Cardiovascular: Negative for chest pain, palpitations and leg swelling.  Gastrointestinal: Negative for nausea, vomiting, abdominal pain and diarrhea.       Occasional rectal bleeding as outlined.  She relates to hemorrhoids.    Genitourinary: Negative for dysuria and difficulty urinating.  Musculoskeletal: Negative for back pain and joint swelling.  Skin:       Rash beneath right breast.  Recently diagnosed with shingles.  Neurological: Negative for dizziness, light-headedness and headaches.  Psychiatric/Behavioral: Negative for dysphoric mood and agitation.       Increased stress as outlined.          Objective:    Physical Exam  Constitutional: She appears well-developed and well-nourished. No distress.  HENT:  Nose: Nose normal.  Mouth/Throat: Oropharynx is clear and moist.  Eyes: Conjunctivae are normal. Right eye exhibits no discharge. Left eye exhibits no discharge.  Neck: Neck supple. No thyromegaly present.  Cardiovascular: Normal rate and regular rhythm.   Pulmonary/Chest: Breath sounds normal. No respiratory distress. She has no wheezes.    Abdominal: Soft. Bowel sounds are normal. There is no tenderness.  Musculoskeletal: She exhibits no edema or tenderness.  Lymphadenopathy:    She has no cervical adenopathy.  Skin:  Erythematous based rash right breast extending around to her back.  C/w shingles rash.    Psychiatric: She has a normal mood and affect. Her behavior is normal.    BP 118/80 mmHg  Pulse 60  Temp(Src) 98.3 F (36.8 C) (Oral)  Resp 18  Ht 5\' 6"  (1.676 m)  Wt 176 lb (79.833 kg)  BMI 28.42 kg/m2  SpO2 98%  LMP 06/24/2010 Wt Readings from Last 3 Encounters:  04/26/15 176 lb (79.833 kg)  04/17/15 178 lb 2 oz (80.797 kg)  09/06/14 176 lb (79.833 kg)     Lab Results  Component Value Date   WBC 8.1 04/28/2015   HGB 14.8 04/28/2015   HCT 43.8 04/28/2015   PLT 236.0 04/28/2015   GLUCOSE 89 04/28/2015   CHOL 188 04/28/2015   TRIG 126.0 04/28/2015   HDL 36.30* 04/28/2015   LDLDIRECT 144.1 03/30/2013   LDLCALC 127* 04/28/2015   ALT 27 04/28/2015   AST 22 04/28/2015   NA 141 04/28/2015   K 3.9 04/28/2015   CL 105 04/28/2015   CREATININE 0.87 04/28/2015   BUN 15 04/28/2015   CO2 30 04/28/2015   TSH 1.65 09/06/2014   HGBA1C 5.2 03/30/2013    Mm Digital Screening Bilateral  11/04/2014  CLINICAL DATA:  Screening. EXAM: DIGITAL SCREENING BILATERAL MAMMOGRAM WITH CAD COMPARISON:  Previous exam(s). ACR Breast Density Category c: The breast tissue is heterogeneously dense, which may obscure small masses. FINDINGS: There are no findings suspicious for malignancy. Images were processed with CAD. IMPRESSION: No mammographic evidence of malignancy. A result letter of this screening mammogram will be mailed directly to the patient. RECOMMENDATION: Screening mammogram in one year. (Code:SM-B-01Y) BI-RADS CATEGORY  1: Negative. Electronically Signed   By: Lovey Newcomer M.D.   On: 11/04/2014 09:11       Assessment & Plan:   Problem List Items Addressed This Visit    Essential hypertension, benign - Primary     Blood pressure under good control.  Continue same medication regimen.  Follow pressures.  Follow metabolic panel.        Herpes zoster    Diagnosed with shingles recently.  Valtrex.  Pain is controlled.  Follow.        Hypercholesterolemia    Low cholesterol diet and exercise.  Follow lipid panel.        Rectal bleeding    Intermittent episodes as outlined.  Discussed the need for further evaluation.  She will call to schedule GI appt.  Desires no further intervention at this time.  Follow.        Stress    Increased stress.  D/w her at length today.  Will increase zoloft to 50mg   q day.  Follow  Get her back in soon to reassess.            Einar Pheasant, MD

## 2015-04-26 NOTE — Progress Notes (Signed)
Pre-visit discussion using our clinic review tool. No additional management support is needed unless otherwise documented below in the visit note.  

## 2015-04-28 ENCOUNTER — Telehealth: Payer: Self-pay | Admitting: *Deleted

## 2015-04-28 ENCOUNTER — Other Ambulatory Visit (INDEPENDENT_AMBULATORY_CARE_PROVIDER_SITE_OTHER): Payer: Managed Care, Other (non HMO)

## 2015-04-28 DIAGNOSIS — K625 Hemorrhage of anus and rectum: Secondary | ICD-10-CM

## 2015-04-28 DIAGNOSIS — E7800 Pure hypercholesterolemia, unspecified: Secondary | ICD-10-CM

## 2015-04-28 DIAGNOSIS — I10 Essential (primary) hypertension: Secondary | ICD-10-CM

## 2015-04-28 LAB — CBC WITH DIFFERENTIAL/PLATELET
Basophils Absolute: 0.1 10*3/uL (ref 0.0–0.1)
Basophils Relative: 0.6 % (ref 0.0–3.0)
EOS PCT: 1.1 % (ref 0.0–5.0)
Eosinophils Absolute: 0.1 10*3/uL (ref 0.0–0.7)
HCT: 43.8 % (ref 36.0–46.0)
Hemoglobin: 14.8 g/dL (ref 12.0–15.0)
Lymphocytes Relative: 45.1 % (ref 12.0–46.0)
Lymphs Abs: 3.7 10*3/uL (ref 0.7–4.0)
MCHC: 33.7 g/dL (ref 30.0–36.0)
MCV: 90.6 fl (ref 78.0–100.0)
Monocytes Absolute: 0.5 10*3/uL (ref 0.1–1.0)
Monocytes Relative: 6.1 % (ref 3.0–12.0)
Neutro Abs: 3.8 10*3/uL (ref 1.4–7.7)
Neutrophils Relative %: 47.1 % (ref 43.0–77.0)
Platelets: 236 10*3/uL (ref 150.0–400.0)
RBC: 4.84 Mil/uL (ref 3.87–5.11)
RDW: 13.9 % (ref 11.5–15.5)
WBC: 8.1 10*3/uL (ref 4.0–10.5)

## 2015-04-28 LAB — COMPREHENSIVE METABOLIC PANEL
ALT: 27 U/L (ref 0–35)
AST: 22 U/L (ref 0–37)
Albumin: 4.2 g/dL (ref 3.5–5.2)
Alkaline Phosphatase: 77 U/L (ref 39–117)
BUN: 15 mg/dL (ref 6–23)
CHLORIDE: 105 meq/L (ref 96–112)
CO2: 30 meq/L (ref 19–32)
Calcium: 10.3 mg/dL (ref 8.4–10.5)
Creatinine, Ser: 0.87 mg/dL (ref 0.40–1.20)
GFR: 70.49 mL/min (ref >60.00)
GLUCOSE: 89 mg/dL (ref 70–99)
POTASSIUM: 3.9 meq/L (ref 3.5–5.1)
SODIUM: 141 meq/L (ref 135–145)
Total Bilirubin: 0.7 mg/dL (ref 0.2–1.2)
Total Protein: 6.7 g/dL (ref 6.0–8.3)

## 2015-04-28 LAB — LIPID PANEL
Cholesterol: 188 mg/dL (ref 0–200)
HDL: 36.3 mg/dL — ABNORMAL LOW (ref >39.00)
LDL CALC: 127 mg/dL — AB (ref 0–99)
NONHDL: 151.9
Total CHOL/HDL Ratio: 5
Triglycerides: 126 mg/dL (ref 0.0–149.0)
VLDL: 25.2 mg/dL (ref 0.0–40.0)

## 2015-04-28 LAB — FERRITIN: FERRITIN: 162.6 ng/mL (ref 10.0–291.0)

## 2015-04-28 NOTE — Telephone Encounter (Signed)
Left message on voicemail.

## 2015-04-28 NOTE — Telephone Encounter (Signed)
Lab and dx?  

## 2015-04-28 NOTE — Telephone Encounter (Signed)
Order placed for labs.

## 2015-04-28 NOTE — Telephone Encounter (Signed)
Patient stated , her valtrex medication is running out, patient requested a refill if needed.

## 2015-04-28 NOTE — Telephone Encounter (Signed)
If this is for the recent diagnosis of shingles, she does not need a refill.  It will not make a difference to continue the medication.  It has to run its course.  The medication if for the initial treatment - to hopefully help shorten the course.

## 2015-05-01 ENCOUNTER — Encounter: Payer: Self-pay | Admitting: Internal Medicine

## 2015-05-01 ENCOUNTER — Encounter: Payer: Self-pay | Admitting: *Deleted

## 2015-05-01 DIAGNOSIS — K625 Hemorrhage of anus and rectum: Secondary | ICD-10-CM | POA: Insufficient documentation

## 2015-05-01 NOTE — Assessment & Plan Note (Signed)
Intermittent episodes as outlined.  Discussed the need for further evaluation.  She will call to schedule GI appt.  Desires no further intervention at this time.  Follow.

## 2015-05-01 NOTE — Assessment & Plan Note (Signed)
Low cholesterol diet and exercise.  Follow lipid panel.   

## 2015-05-01 NOTE — Assessment & Plan Note (Signed)
Increased stress.  D/w her at length today.  Will increase zoloft to 50mg  q day.  Follow  Get her back in soon to reassess.

## 2015-05-01 NOTE — Assessment & Plan Note (Signed)
Blood pressure under good control.  Continue same medication regimen.  Follow pressures.  Follow metabolic panel.   

## 2015-05-01 NOTE — Assessment & Plan Note (Signed)
Diagnosed with shingles recently.  Valtrex.  Pain is controlled.  Follow.

## 2015-05-04 ENCOUNTER — Telehealth: Payer: Self-pay | Admitting: Internal Medicine

## 2015-05-04 NOTE — Telephone Encounter (Signed)
Pt called wanting to know if Dr Nicki Reaper will prescribe another medication for the pain? Or  HYDROcodone-acetaminophen (NORCO/VICODIN) 5-325 MG tablet. Which she will pick up. Or something else that will help at the nerve endings. Pharmacy is [facility] on [address]. Thank you!

## 2015-05-05 MED ORDER — GABAPENTIN 100 MG PO CAPS: 100.0000 mg | ORAL_CAPSULE | Freq: Three times a day (TID) | ORAL | Status: DC

## 2015-05-05 NOTE — Telephone Encounter (Signed)
rx sent into pharmacy

## 2015-05-05 NOTE — Telephone Encounter (Signed)
See below

## 2015-05-05 NOTE — Telephone Encounter (Signed)
If pt agrees, she can try neurontin (gabapentin) 100mg  tid.  This medication is specifically for nerve pain.  I would recommend starting at this dose and then can titrate up if needed.  She would need to let me know how this dose is working for her.  If agreeable, can send in rx for neurontin 100mg  tid #90 with one refill or send message back to me and I will send in.  She wants to go to [facility] on [address]

## 2015-05-05 NOTE — Telephone Encounter (Signed)
Spoke with patient, she is agreeable for gabapentin prescription to try.  She agreed that the [facility] on [address] is the correct pharmacy.

## 2015-05-17 ENCOUNTER — Other Ambulatory Visit: Payer: Self-pay | Admitting: Internal Medicine

## 2015-06-20 ENCOUNTER — Other Ambulatory Visit: Payer: Self-pay | Admitting: Internal Medicine

## 2015-07-03 ENCOUNTER — Ambulatory Visit: Payer: Managed Care, Other (non HMO) | Admitting: Internal Medicine

## 2015-08-10 ENCOUNTER — Other Ambulatory Visit: Payer: Self-pay | Admitting: Internal Medicine

## 2015-08-10 NOTE — Telephone Encounter (Signed)
ok'd refill for zolft #30 with 1 refill

## 2015-10-02 ENCOUNTER — Encounter: Payer: Self-pay | Admitting: Internal Medicine

## 2015-10-02 ENCOUNTER — Ambulatory Visit (INDEPENDENT_AMBULATORY_CARE_PROVIDER_SITE_OTHER): Payer: Managed Care, Other (non HMO) | Admitting: Internal Medicine

## 2015-10-02 VITALS — BP 128/70 | HR 66 | Temp 98.2°F | Resp 18 | Ht 66.0 in | Wt 174.8 lb

## 2015-10-02 DIAGNOSIS — Z0000 Encounter for general adult medical examination without abnormal findings: Secondary | ICD-10-CM

## 2015-10-02 DIAGNOSIS — Z1239 Encounter for other screening for malignant neoplasm of breast: Secondary | ICD-10-CM

## 2015-10-02 DIAGNOSIS — K625 Hemorrhage of anus and rectum: Secondary | ICD-10-CM

## 2015-10-02 DIAGNOSIS — R011 Cardiac murmur, unspecified: Secondary | ICD-10-CM

## 2015-10-02 DIAGNOSIS — I10 Essential (primary) hypertension: Secondary | ICD-10-CM

## 2015-10-02 DIAGNOSIS — R319 Hematuria, unspecified: Secondary | ICD-10-CM

## 2015-10-02 DIAGNOSIS — E7800 Pure hypercholesterolemia, unspecified: Secondary | ICD-10-CM

## 2015-10-02 DIAGNOSIS — Z1211 Encounter for screening for malignant neoplasm of colon: Secondary | ICD-10-CM

## 2015-10-02 DIAGNOSIS — F439 Reaction to severe stress, unspecified: Secondary | ICD-10-CM

## 2015-10-02 MED ORDER — HYDROCHLOROTHIAZIDE 25 MG PO TABS: 25.0000 mg | ORAL_TABLET | Freq: Every day | ORAL | Status: DC

## 2015-10-02 MED ORDER — SERTRALINE HCL 50 MG PO TABS: 50.0000 mg | ORAL_TABLET | Freq: Every day | ORAL | Status: DC

## 2015-10-02 NOTE — Progress Notes (Signed)
Pre-visit discussion using our clinic review tool. No additional management support is needed unless otherwise documented below in the visit note.  

## 2015-10-02 NOTE — Progress Notes (Signed)
[name], female   DOB: [birth year], 60 y.o.   MRN: [id number]   Subjective:    [name], female    DOB: 1956/06/10, 60 y.o.   MRN: [id number]  HPI  Patient here for her physical.  Still with increased stress.  Stress with family issues.  Discussed with her today.  She feels she is handling things relatively well.  On zoloft.  Does not feel needs anything more at this point.  Tries to stay active.  Weight is down a few pounds, but she feels she has gained weight in her abdomen.  States feels things are shifting.  No abdominal pain.  No nausea or vomiting.  Bowels moving.  Has not had colonoscopy.  Again discussed with her.  No chest pain or tightness.  No sob.     Past Medical History  Diagnosis Date  . Hypercholesterolemia   . Hypertension   . Hematuria    Past Surgical History  Procedure Laterality Date  . No past surgeries     Family History  Problem Relation Age of Onset  . Diabetes Mother   . Heart disease Mother   . Stroke Mother   . Hypertension Mother   . Cancer Father     prostate   . Diverticulitis Father   . CVA Father   . Heart disease Father   . Stroke Father   . Hypertension Father   . CVA Paternal Grandmother   . Breast cancer Cousin    Social History   Social History  . Marital Status: Married    Spouse Name: N/A  . Number of Children: 2  . Years of Education: N/A   Social History Main Topics  . Smoking status: Never Smoker   . Smokeless tobacco: Never Used  . Alcohol Use: No  . Drug Use: No  . Sexual Activity: Not Asked   Other Topics Concern  . None   Social History Narrative    Outpatient Encounter Prescriptions as of 10/02/2015  Medication Sig  . hydrochlorothiazide (HYDRODIURIL) 25 MG tablet Take 1 tablet (25 mg total) by mouth daily.  . Multiple Vitamin (MULTIVITAMIN) tablet Take 1 tablet by mouth daily.  . sertraline (ZOLOFT) 50 MG tablet Take 1 tablet (50 mg total) by mouth daily.  .  [DISCONTINUED] hydrochlorothiazide (HYDRODIURIL) 25 MG tablet TAKE ONE TABLET BY MOUTH ONCE DAILY  . [DISCONTINUED] sertraline (ZOLOFT) 50 MG tablet TAKE ONE TABLET BY MOUTH ONCE DAILY  . [DISCONTINUED] gabapentin (NEURONTIN) 100 MG capsule Take 1 capsule (100 mg total) by mouth 3 (three) times daily.  . [DISCONTINUED] HYDROcodone-acetaminophen (NORCO/VICODIN) 5-325 MG tablet Take 1 tablet by mouth every 8 (eight) hours as needed for moderate pain.  . [DISCONTINUED] valACYclovir (VALTREX) 1000 MG tablet Take 1 tablet (1,000 mg total) by mouth 2 (two) times daily.   No facility-administered encounter medications on file as of 10/02/2015.    Review of Systems  Constitutional: Negative for appetite change and unexpected weight change.  HENT: Negative for congestion and sinus pressure.   Eyes: Negative for pain and visual disturbance.  Respiratory: Negative for cough, chest tightness and shortness of breath.   Cardiovascular: Negative for chest pain, palpitations and leg swelling.  Gastrointestinal: Negative for nausea, vomiting, abdominal pain and diarrhea.  Genitourinary: Negative for dysuria and difficulty urinating.  Musculoskeletal: Negative for back pain and joint swelling.  Skin: Negative for color change and rash.  Neurological: Negative for dizziness, light-headedness and headaches.  Hematological: Negative  for adenopathy. Does not bruise/bleed easily.  Psychiatric/Behavioral: Negative for dysphoric mood and agitation.       Increased stress as outlined.        Objective:    Physical Exam  Constitutional: She is oriented to person, place, and time. She appears well-developed and well-nourished. No distress.  HENT:  Nose: Nose normal.  Mouth/Throat: Oropharynx is clear and moist.  Eyes: Right eye exhibits no discharge. Left eye exhibits no discharge. No scleral icterus.  Neck: Neck supple. No thyromegaly present.  Cardiovascular: Normal rate and regular rhythm.   New 2/6  systolic murmur.    Pulmonary/Chest: Breath sounds normal. No accessory muscle usage. No tachypnea. No respiratory distress. She has no decreased breath sounds. She has no wheezes. She has no rhonchi. Right breast exhibits no inverted nipple, no mass, no nipple discharge and no tenderness (no axillary adenopathy). Left breast exhibits no inverted nipple, no mass, no nipple discharge and no tenderness (no axilarry adenopathy).  Abdominal: Soft. Bowel sounds are normal. There is no tenderness.  Musculoskeletal: She exhibits no edema or tenderness.  Lymphadenopathy:    She has no cervical adenopathy.  Neurological: She is alert and oriented to person, place, and time.  Skin: Skin is warm. No rash noted. No erythema.  Psychiatric: She has a normal mood and affect. Her behavior is normal.    BP 128/70 mmHg  Pulse 66  Temp(Src) 98.2 F (36.8 C) (Oral)  Resp 18  Ht 5\' 6"  (1.676 m)  Wt 174 lb 12 oz (79.266 kg)  BMI 28.22 kg/m2  SpO2 97%  LMP 06/24/2010 Wt Readings from Last 3 Encounters:  10/02/15 174 lb 12 oz (79.266 kg)  04/26/15 176 lb (79.833 kg)  04/17/15 178 lb 2 oz (80.797 kg)     Lab Results  Component Value Date   WBC 8.1 04/28/2015   HGB 14.8 04/28/2015   HCT 43.8 04/28/2015   PLT 236.0 04/28/2015   GLUCOSE 89 04/28/2015   CHOL 188 04/28/2015   TRIG 126.0 04/28/2015   HDL 36.30* 04/28/2015   LDLDIRECT 144.1 03/30/2013   LDLCALC 127* 04/28/2015   ALT 27 04/28/2015   AST 22 04/28/2015   NA 141 04/28/2015   K 3.9 04/28/2015   CL 105 04/28/2015   CREATININE 0.87 04/28/2015   BUN 15 04/28/2015   CO2 30 04/28/2015   TSH 1.65 09/06/2014   HGBA1C 5.2 03/30/2013    Mm Digital Screening Bilateral  11/04/2014  CLINICAL DATA:  Screening. EXAM: DIGITAL SCREENING BILATERAL MAMMOGRAM WITH CAD COMPARISON:  Previous exam(s). ACR Breast Density Category c: The breast tissue is heterogeneously dense, which may obscure small masses. FINDINGS: There are no findings suspicious for  malignancy. Images were processed with CAD. IMPRESSION: No mammographic evidence of malignancy. A result letter of this screening mammogram will be mailed directly to the patient. RECOMMENDATION: Screening mammogram in one year. (Code:SM-B-01Y) BI-RADS CATEGORY  1: Negative. Electronically Signed   By: Lovey Newcomer M.D.   On: 11/04/2014 09:11       Assessment & Plan:   Problem List Items Addressed This Visit    Essential hypertension, benign    Blood pressure under good control.  Continue same medication regimen.  Follow pressures.  Follow metabolic panel.        Relevant Medications   hydrochlorothiazide (HYDRODIURIL) 25 MG tablet   Other Relevant Orders   CBC with Differential/Platelet   Comprehensive metabolic panel   TSH   Health care maintenance    Physical today  10/02/15.  PAP 09/02/13 negative with negative HPV.  Mammogram 11/04/14 - Birads I.  She wanted to call and schedule her own mammogram.  Refer to GI for colonoscopy.        Hematuria    Has been worked up by Dr Yves Dill.  Last urinalysis with 3-6 rbc's.  Discussed referral back to urology.  She has declined.  Recheck urinalysis with next labs.        Relevant Orders   Urinalysis, Routine w reflex microscopic (not at [facility])   Hypercholesterolemia    Low cholesterol diet and exercise.  Follow lipid panel.        Relevant Medications   hydrochlorothiazide (HYDRODIURIL) 25 MG tablet   Other Relevant Orders   Lipid panel   Newly recognized murmur    Check echocardiogram.       Relevant Orders   Echocardiogram   Rectal bleeding    Still with occasional intermittent rectal bleeding.  Has not had colonoscopy.  Again discussed with her today.  Will schedule appt with GI for evaluation.  Check cbc.       Relevant Orders   Ambulatory referral to Gastroenterology   Stress    Discussed with her today.  On zoloft.  Does not feel needs anything more at this point.  Follow.         Other Visit Diagnoses    Colon cancer  screening    -  Primary    Relevant Orders    Ambulatory referral to Gastroenterology    Screening breast examination        Relevant Orders    MM DIGITAL SCREENING BILATERAL        Einar Pheasant, MD

## 2015-10-03 ENCOUNTER — Encounter: Payer: Self-pay | Admitting: Internal Medicine

## 2015-10-03 DIAGNOSIS — R011 Cardiac murmur, unspecified: Secondary | ICD-10-CM | POA: Insufficient documentation

## 2015-10-03 NOTE — Assessment & Plan Note (Signed)
Physical today 10/02/15.  PAP 09/02/13 negative with negative HPV.  Mammogram 11/04/14 - Birads I.  She wanted to call and schedule her own mammogram.  Refer to GI for colonoscopy.

## 2015-10-03 NOTE — Assessment & Plan Note (Signed)
Still with occasional intermittent rectal bleeding.  Has not had colonoscopy.  Again discussed with her today.  Will schedule appt with GI for evaluation.  Check cbc.

## 2015-10-03 NOTE — Assessment & Plan Note (Signed)
Discussed with her today.  On zoloft.  Does not feel needs anything more at this point.  Follow.

## 2015-10-03 NOTE — Assessment & Plan Note (Signed)
Low cholesterol diet and exercise.  Follow lipid panel.   

## 2015-10-03 NOTE — Assessment & Plan Note (Signed)
Has been worked up by Dr Yves Dill.  Last urinalysis with 3-6 rbc's.  Discussed referral back to urology.  She has declined.  Recheck urinalysis with next labs.

## 2015-10-03 NOTE — Assessment & Plan Note (Signed)
Blood pressure under good control.  Continue same medication regimen.  Follow pressures.  Follow metabolic panel.   

## 2015-10-03 NOTE — Assessment & Plan Note (Signed)
Check echocardiogram 

## 2015-10-06 ENCOUNTER — Encounter (INDEPENDENT_AMBULATORY_CARE_PROVIDER_SITE_OTHER): Payer: Self-pay

## 2015-10-06 ENCOUNTER — Other Ambulatory Visit: Payer: Self-pay

## 2015-10-06 ENCOUNTER — Ambulatory Visit (INDEPENDENT_AMBULATORY_CARE_PROVIDER_SITE_OTHER): Payer: Managed Care, Other (non HMO)

## 2015-10-06 DIAGNOSIS — R011 Cardiac murmur, unspecified: Secondary | ICD-10-CM | POA: Diagnosis not present

## 2015-10-26 ENCOUNTER — Encounter (INDEPENDENT_AMBULATORY_CARE_PROVIDER_SITE_OTHER): Payer: Self-pay

## 2015-10-26 ENCOUNTER — Encounter: Payer: Self-pay | Admitting: Cardiology

## 2015-10-26 ENCOUNTER — Ambulatory Visit (INDEPENDENT_AMBULATORY_CARE_PROVIDER_SITE_OTHER): Payer: Managed Care, Other (non HMO) | Admitting: Cardiology

## 2015-10-26 VITALS — BP 138/80 | HR 68 | Ht 65.0 in | Wt 171.5 lb

## 2015-10-26 DIAGNOSIS — I340 Nonrheumatic mitral (valve) insufficiency: Secondary | ICD-10-CM | POA: Diagnosis not present

## 2015-10-26 DIAGNOSIS — I10 Essential (primary) hypertension: Secondary | ICD-10-CM

## 2015-10-26 DIAGNOSIS — I4510 Unspecified right bundle-branch block: Secondary | ICD-10-CM | POA: Insufficient documentation

## 2015-10-26 DIAGNOSIS — R011 Cardiac murmur, unspecified: Secondary | ICD-10-CM | POA: Diagnosis not present

## 2015-10-26 DIAGNOSIS — R0602 Shortness of breath: Secondary | ICD-10-CM | POA: Diagnosis not present

## 2015-10-26 DIAGNOSIS — Z8249 Family history of ischemic heart disease and other diseases of the circulatory system: Secondary | ICD-10-CM

## 2015-10-26 DIAGNOSIS — I450 Right fascicular block: Secondary | ICD-10-CM | POA: Diagnosis not present

## 2015-10-26 NOTE — Progress Notes (Signed)
Cardiology Office Note   Date:  10/26/2015   ID:  Mary Walter, DOB Jun 12, 1956, MRN [id number]  Referring Doctor:  Einar Pheasant, MD   Cardiologist:   Wende Bushy, MD   Reason for consultation:  Chief Complaint  Patient presents with  . other    Cardiac murmur c/o sob. Meds reviewed verbally .      History of Present Illness: Mary Walter is a 60 y.o. female who presents for evaluation of murmur, shortness of breath.  Patient was noted to have a murmur per PCP. Echo was done. Discussed the findings with the patient. It appears that she had mild MR.  Patient reports shortness breath. This has been going on for 6 months. Symptoms are located mainly in the chest, nonradiating. Moderate in severity. Noted when climbing stairs mainly. Lasts several minutes at a time, resolved with rest. Progressive since onset.  Patient denies chest pain, palpitations, loss of consciousness. No fever, colds, cough, headaches. No PND, orthopnea, edema.   ROS:  Please see the history of present illness. Aside from mentioned under HPI, all other systems are reviewed and negative.     Past Medical History  Diagnosis Date  . Hypercholesterolemia   . Hypertension   . Hematuria     Past Surgical History  Procedure Laterality Date  . No past surgeries       reports that she has never smoked. She has never used smokeless tobacco. She reports that she does not drink alcohol or use illicit drugs.   family history includes Breast cancer in her cousin; CVA in her father and paternal grandmother; Cancer in her father; Diabetes in her mother; Diverticulitis in her father; Heart attack in her brother; Heart disease in her father and mother; Heart murmur in her father; Hypertension in her father and mother; Stroke in her father.  Mother had CABG in her 62s. She also had diabetes. Brother had MI in his 64s.  Current Outpatient Prescriptions  Medication Sig Dispense Refill  .  hydrochlorothiazide (HYDRODIURIL) 25 MG tablet Take 1 tablet (25 mg total) by mouth daily. 30 tablet 5  . Multiple Vitamin (MULTIVITAMIN) tablet Take 1 tablet by mouth daily.    . sertraline (ZOLOFT) 50 MG tablet Take 1 tablet (50 mg total) by mouth daily. 30 tablet 5   No current facility-administered medications for this visit.    Allergies: Macrobid and Penicillins    PHYSICAL EXAM: VS:  BP 138/80 mmHg  Pulse 68  Ht 5\' 5"  (1.651 m)  Wt 171 lb 8 oz (77.792 kg)  BMI 28.54 kg/m2  LMP 06/24/2010 , Body mass index is 28.54 kg/(m^2). Wt Readings from Last 3 Encounters:  10/26/15 171 lb 8 oz (77.792 kg)  10/02/15 174 lb 12 oz (79.266 kg)  04/26/15 176 lb (79.833 kg)    GENERAL:  well developed, well nourished, overweight, not in acute distress HEENT: normocephalic, pink conjunctivae, anicteric sclerae, no xanthelasma, normal dentition, oropharynx clear NECK:  no neck vein engorgement, JVP normal, no hepatojugular reflux, carotid upstroke brisk and symmetric, no bruit, no thyromegaly, no lymphadenopathy LUNGS:  good respiratory effort, clear to auscultation bilaterally CV:  PMI not displaced, no thrills, no lifts, S1 and S2 within normal limits, no palpable S3 or S4, no murmurs, no rubs, no gallops ABD:  Soft, nontender, nondistended, normoactive bowel sounds, no abdominal aortic bruit, no hepatomegaly, no splenomegaly MS: nontender back, no kyphosis, no scoliosis, no joint deformities EXT:  2+ DP/PT pulses, no edema, no  varicosities, no cyanosis, no clubbing SKIN: warm, nondiaphoretic, normal turgor, no ulcers NEUROPSYCH: alert, oriented to person, place, and time, sensory/motor grossly intact, normal mood, appropriate affect  Recent Labs: 04/28/2015: ALT 27; BUN 15; Creatinine, Ser 0.87; Hemoglobin 14.8; Platelets 236.0; Potassium 3.9; Sodium 141   Lipid Panel    Component Value Date/Time   CHOL 188 04/28/2015 1420   TRIG 126.0 04/28/2015 1420   HDL 36.30* 04/28/2015 1420    CHOLHDL 5 04/28/2015 1420   VLDL 25.2 04/28/2015 1420   LDLCALC 127* 04/28/2015 1420   LDLDIRECT 144.1 03/30/2013 0829     Other studies Reviewed:  EKG:  The ekg from 10/26/2015 was personally reviewed by me and it revealed sinus rhythm, 60 BPM. Incomplete right bundle-branch block, nonspecific T-wave changes. Abnormal EKG.  Additional studies/ records that were reviewed personally reviewed by me today include:  Echocardiogram 10/06/2015: Left ventricle: The cavity size was normal. Systolic function was  normal. The estimated ejection fraction was in the range of 60%  to 65%. Wall motion was normal; there were no regional wall  motion abnormalities. Left ventricular diastolic function  parameters were normal. - Mitral valve: There was mild regurgitation. - Left atrium: The atrium was normal in size. - Right ventricle: Systolic function was normal. - Pulmonary arteries: Systolic pressure was within the normal  range.  ASSESSMENT AND PLAN:  Cardiac murmur PCP ordered echo. This was reviewed in detail with patient. It revealed mild mitral regurgitation.  Mild mitral regurgitation No significant murmur auscultated. Serial evaluation recommended. Blood pressure monitoring.  Shortness of breath Incomplete right bundle branch block Patient has multiple risk factors for CAD including age, postmenopausal state, hypertension, significant family history of premature CAD. Recommend further evaluation with stress testing. Follow-up in office with results.  Hypertension BP is well controlled. Continue monitoring BP. Continue current medical therapy and lifestyle changes.  Family history of premature CAD  Current medicines are reviewed at length with the patient today.  The patient does not have concerns regarding medicines.  Labs/ tests ordered today include:  Orders Placed This Encounter  Procedures  . NM Myocar Multi W/Spect W/Wall Motion / EF  . EKG 12-Lead    I had a  lengthy and detailed discussion with the patient regarding diagnoses, prognosis, diagnostic options, treatment options , and side effects of medications.   I counseled the patient on importance of lifestyle modification including heart healthy diet, regular physical activity once cardiac workup is done.   Disposition:   FU with undersigned after tests    Signed, Wende Bushy, MD  10/26/2015 11:17 PM    [facility]

## 2015-10-26 NOTE — Patient Instructions (Addendum)
Medication Instructions:  Your physician recommends that you continue on your current medications as directed. Please refer to the Current Medication list given to you today.   Labwork: None ordered  Testing/Procedures: [place]  Your caregiver has ordered a Stress Test with nuclear imaging. The purpose of this test is to evaluate the blood supply to your heart muscle. This procedure is referred to as a "Non-Invasive Stress Test." This is because other than having an IV started in your vein, nothing is inserted or "invades" your body. Cardiac stress tests are done to find areas of poor blood flow to the heart by determining the extent of coronary artery disease (CAD). Some patients exercise on a treadmill, which naturally increases the blood flow to your heart, while others who are  unable to walk on a treadmill due to physical limitations have a pharmacologic/chemical stress agent called Lexiscan . This medicine will mimic walking on a treadmill by temporarily increasing your coronary blood flow.   Please note: these test may take anywhere between 2-4 hours to complete  PLEASE REPORT TO [facility] AT THE FIRST DESK WILL DIRECT YOU WHERE TO GO  Date of Procedure:_Friday Nov 03, 2015 at 08:30AM______  Arrival Time for Procedure:___Arrive at 08:15AM_______   PLEASE NOTIFY THE OFFICE AT LEAST 24 HOURS IN ADVANCE IF YOU ARE UNABLE TO [place].  [phone] AND  PLEASE NOTIFY NUCLEAR MEDICINE AT [facility] AT LEAST 24 HOURS IN ADVANCE IF YOU ARE UNABLE TO KEEP YOUR APPOINTMENT. [phone]  How to prepare for your Myoview test:   Do not eat or drink after midnight  No caffeine for 24 hours prior to test  No smoking 24 hours prior to test.  Your medication may be taken with water.  If your doctor stopped a medication because of this test, do not take that medication.  Ladies, please do not wear dresses.  Skirts or pants are appropriate.  Please wear a short sleeve shirt.  No perfume, cologne or lotion.  Wear comfortable walking shoes. No heels!     Follow-Up: Your physician recommends that you schedule a follow-up appointment after testing to review results.  Date & Time: __________________________________________________________________   Any Other Special Instructions Will Be Listed Below (If Applicable).     If you need a refill on your cardiac medications before your next appointment, please call your pharmacy.  Cardiac Nuclear Scanning A cardiac nuclear scan is used to check your heart for problems, such as the following:  A portion of the heart is not getting enough blood.  Part of the heart muscle has died, which happens with a heart attack.  The heart wall is not working normally.  In this test, a radioactive dye (tracer) is injected into your bloodstream. After the tracer has traveled to your heart, a scanning device is used to measure how much of the tracer is absorbed by or distributed to various areas of your heart. LET [facility] CARE PROVIDER KNOW ABOUT:  Any allergies you have.  All medicines you are taking, including vitamins, herbs, eye drops, creams, and over-the-counter medicines.  Previous problems you or members of your family have had with the use of anesthetics.  Any blood disorders you have.  Previous surgeries you have had.  Medical conditions you have.  RISKS AND COMPLICATIONS Generally, this is a safe procedure. However, as with any procedure, problems can occur. Possible problems include:   Serious chest pain.  Rapid heartbeat.  Sensation of  warmth in your chest. This usually passes quickly. BEFORE THE PROCEDURE Ask your health care provider about changing or stopping your regular medicines. PROCEDURE This procedure is usually done at a hospital and takes 2-4 hours.  An IV tube is inserted into one of your veins.  Your health care provider will inject a small  amount of radioactive tracer through the tube.  You will then wait for 20-40 minutes while the tracer travels through your bloodstream.  You will lie down on an exam table so images of your heart can be taken. Images will be taken for about 15-20 minutes.  You will exercise on a treadmill or stationary bike. While you exercise, your heart activity will be monitored with an electrocardiogram (ECG), and your blood pressure will be checked.  If you are unable to exercise, you may be given a medicine to make your heart beat faster.  When blood flow to your heart has peaked, tracer will again be injected through the IV tube.  After 20-40 minutes, you will get back on the exam table and have more images taken of your heart.  When the procedure is over, your IV tube will be removed. AFTER THE PROCEDURE  You will likely be able to leave shortly after the test. Unless your health care provider tells you otherwise, you may return to your normal schedule, including diet, activities, and medicines.  Make sure you find out how and when you will get your test results.   This information is not intended to replace advice given to you by your health care provider. Make sure you discuss any questions you have with your health care provider.   Document Released: 07/05/2004 Document Revised: 06/15/2013 Document Reviewed: 05/19/2013 Elsevier Interactive Patient Education [employer].

## 2015-11-03 ENCOUNTER — Encounter
Admission: RE | Admit: 2015-11-03 | Discharge: 2015-11-03 | Disposition: A | Discharge status: Home / Self Care | Payer: Managed Care, Other (non HMO) | Source: Ambulatory Visit | Attending: Cardiology | Admitting: Cardiology

## 2015-11-03 DIAGNOSIS — R0602 Shortness of breath: Secondary | ICD-10-CM | POA: Insufficient documentation

## 2015-11-03 DIAGNOSIS — I4510 Unspecified right bundle-branch block: Secondary | ICD-10-CM | POA: Diagnosis present

## 2015-11-03 DIAGNOSIS — I450 Right fascicular block: Secondary | ICD-10-CM | POA: Insufficient documentation

## 2015-11-03 DIAGNOSIS — I340 Nonrheumatic mitral (valve) insufficiency: Secondary | ICD-10-CM

## 2015-11-03 DIAGNOSIS — R9431 Abnormal electrocardiogram [ECG] [EKG]: Secondary | ICD-10-CM | POA: Diagnosis present

## 2015-11-03 LAB — NM MYOCAR MULTI W/SPECT W/WALL MOTION / EF
CSEPEW: 10.1 METS
Exercise duration (min): 8 min
Exercise duration (sec): 0 s
LV dias vol: 70 mL (ref 46–106)
LVSYSVOL: 22 mL
MPHR: 160 {beats}/min
Peak HR: 148 {beats}/min
Percent HR: 92 %
Rest HR: 65 {beats}/min
SDS: 0
SRS: 6
SSS: 1
TID: 1.03

## 2015-11-03 MED ORDER — TECHNETIUM TC 99M SESTAMIBI GENERIC - CARDIOLITE
12.9500 | Freq: Once | INTRAVENOUS | Status: AC | PRN
  Administered 2015-11-03: 12.95 via INTRAVENOUS

## 2015-11-03 MED ORDER — TECHNETIUM TC 99M SESTAMIBI - CARDIOLITE
30.8000 | Freq: Once | INTRAVENOUS | Status: AC | PRN
  Administered 2015-11-03: 10:00:00 30.8 via INTRAVENOUS

## 2015-11-06 ENCOUNTER — Other Ambulatory Visit (INDEPENDENT_AMBULATORY_CARE_PROVIDER_SITE_OTHER): Payer: Managed Care, Other (non HMO)

## 2015-11-06 DIAGNOSIS — I10 Essential (primary) hypertension: Secondary | ICD-10-CM

## 2015-11-06 DIAGNOSIS — E7800 Pure hypercholesterolemia, unspecified: Secondary | ICD-10-CM | POA: Diagnosis not present

## 2015-11-06 DIAGNOSIS — R319 Hematuria, unspecified: Secondary | ICD-10-CM

## 2015-11-06 LAB — COMPREHENSIVE METABOLIC PANEL
ALBUMIN: 4.2 g/dL (ref 3.5–5.2)
ALK PHOS: 71 U/L (ref 39–117)
ALT: 17 U/L (ref 0–35)
AST: 16 U/L (ref 0–37)
BILIRUBIN TOTAL: 0.8 mg/dL (ref 0.2–1.2)
BUN: 14 mg/dL (ref 6–23)
CALCIUM: 10.4 mg/dL (ref 8.4–10.5)
CHLORIDE: 108 meq/L (ref 96–112)
CO2: 28 meq/L (ref 19–32)
CREATININE: 0.85 mg/dL (ref 0.40–1.20)
GFR: 72.28 mL/min (ref >60.00)
Glucose, Bld: 95 mg/dL (ref 70–99)
POTASSIUM: 3.9 meq/L (ref 3.5–5.1)
SODIUM: 143 meq/L (ref 135–145)
Total Protein: 6.3 g/dL (ref 6.0–8.3)

## 2015-11-06 LAB — URINALYSIS, ROUTINE W REFLEX MICROSCOPIC
BILIRUBIN URINE: NEGATIVE
KETONES UR: NEGATIVE
NITRITE: NEGATIVE
PH: 7 (ref 5.0–8.0)
Specific Gravity, Urine: 1.015 (ref 1.000–1.030)
TOTAL PROTEIN, URINE-UPE24: NEGATIVE
UROBILINOGEN UA: 0.2 (ref 0.0–1.0)
Urine Glucose: NEGATIVE

## 2015-11-06 LAB — CBC WITH DIFFERENTIAL/PLATELET
BASOS ABS: 0.1 10*3/uL (ref 0.0–0.1)
BASOS PCT: 0.8 % (ref 0.0–3.0)
Eosinophils Absolute: 0.1 10*3/uL (ref 0.0–0.7)
Eosinophils Relative: 1.8 % (ref 0.0–5.0)
HCT: 41.2 % (ref 36.0–46.0)
Hemoglobin: 14.1 g/dL (ref 12.0–15.0)
Lymphocytes Relative: 40.1 % (ref 12.0–46.0)
Lymphs Abs: 2.6 10*3/uL (ref 0.7–4.0)
MCHC: 34.3 g/dL (ref 30.0–36.0)
MCV: 88.9 fl (ref 78.0–100.0)
MONO ABS: 0.4 10*3/uL (ref 0.1–1.0)
Monocytes Relative: 5.5 % (ref 3.0–12.0)
NEUTROS ABS: 3.4 10*3/uL (ref 1.4–7.7)
Neutrophils Relative %: 51.8 % (ref 43.0–77.0)
PLATELETS: 186 10*3/uL (ref 150.0–400.0)
RBC: 4.63 Mil/uL (ref 3.87–5.11)
RDW: 13.7 % (ref 11.5–15.5)
WBC: 6.5 10*3/uL (ref 4.0–10.5)

## 2015-11-06 LAB — LIPID PANEL
Cholesterol: 198 mg/dL (ref 0–200)
HDL: 32.9 mg/dL — ABNORMAL LOW (ref >39.00)
LDL Cholesterol: 138 mg/dL — ABNORMAL HIGH (ref 0–99)
NONHDL: 165.58
Total CHOL/HDL Ratio: 6
Triglycerides: 137 mg/dL (ref 0.0–149.0)
VLDL: 27.4 mg/dL (ref 0.0–40.0)

## 2015-11-06 LAB — TSH: TSH: 1.39 u[IU]/mL (ref 0.35–4.50)

## 2015-11-09 ENCOUNTER — Telehealth: Payer: Self-pay | Admitting: Cardiology

## 2015-11-09 NOTE — Telephone Encounter (Signed)
° ° ° °

## 2015-11-10 NOTE — Telephone Encounter (Signed)
Reviewed results of stress test with patient and let her know that Dr. Yvone Neu would review in more detail at her follow up appointment next week. She verbalized understanding and had no further questions.

## 2015-11-10 NOTE — Telephone Encounter (Signed)
No ischemia on stress test. Details to be discussed on fup

## 2015-11-14 ENCOUNTER — Encounter: Payer: Self-pay | Admitting: Cardiology

## 2015-11-14 ENCOUNTER — Ambulatory Visit (INDEPENDENT_AMBULATORY_CARE_PROVIDER_SITE_OTHER): Payer: Managed Care, Other (non HMO) | Admitting: Cardiology

## 2015-11-14 VITALS — BP 130/66 | HR 84 | Ht 65.0 in | Wt 175.0 lb

## 2015-11-14 DIAGNOSIS — I4510 Unspecified right bundle-branch block: Secondary | ICD-10-CM

## 2015-11-14 DIAGNOSIS — I340 Nonrheumatic mitral (valve) insufficiency: Secondary | ICD-10-CM

## 2015-11-14 DIAGNOSIS — I10 Essential (primary) hypertension: Secondary | ICD-10-CM

## 2015-11-14 DIAGNOSIS — I450 Right fascicular block: Secondary | ICD-10-CM | POA: Diagnosis not present

## 2015-11-14 DIAGNOSIS — Z8249 Family history of ischemic heart disease and other diseases of the circulatory system: Secondary | ICD-10-CM | POA: Diagnosis not present

## 2015-11-14 NOTE — Progress Notes (Signed)
Cardiology Office Note   Date:  11/14/2015   ID:  Branden, Ferrar 03/09/1956, MRN [id number]  Referring Doctor:  Einar Pheasant, MD   Cardiologist:   Wende Bushy, MD   Reason for consultation:  Chief Complaint  Patient presents with  . other    Myoview. Meds reviewed verbally with pt.      History of Present Illness: Mary Walter is a 60 y.o. female who presents for  Follow up after tests  Patient reports shortness breath. Same as before, nonprogressive.   HTN, BP likely wnl per patient  Patient denies chest pain, palpitations, loss of consciousness. No fever, colds, cough, headaches. No PND, orthopnea, edema.   ROS:  Please see the history of present illness. Aside from mentioned under HPI, all other systems are reviewed and negative.     Past Medical History  Diagnosis Date  . Hypercholesterolemia   . Hypertension   . Hematuria     Past Surgical History  Procedure Laterality Date  . No past surgeries       reports that she has never smoked. She has never used smokeless tobacco. She reports that she does not drink alcohol or use illicit drugs.   family history includes Breast cancer in her cousin; CVA in her father and paternal grandmother; Cancer in her father; Diabetes in her mother; Diverticulitis in her father; Heart attack in her brother; Heart disease in her father and mother; Heart murmur in her father; Hypertension in her father and mother; Stroke in her father.  Mother had CABG in her 44s. She also had diabetes. Brother had MI in his 53s.  Current Outpatient Prescriptions  Medication Sig Dispense Refill  . hydrochlorothiazide (HYDRODIURIL) 25 MG tablet Take 1 tablet (25 mg total) by mouth daily. 30 tablet 5  . Multiple Vitamin (MULTIVITAMIN) tablet Take 1 tablet by mouth daily.    . sertraline (ZOLOFT) 50 MG tablet Take 1 tablet (50 mg total) by mouth daily. 30 tablet 5   No current facility-administered medications for this visit.     Allergies: Macrobid and Penicillins    PHYSICAL EXAM: VS:  BP 130/66 mmHg  Pulse 84  Ht 5\' 5"  (1.651 m)  Wt 175 lb (79.379 kg)  BMI 29.12 kg/m2  LMP 06/24/2010 , Body mass index is 29.12 kg/(m^2). Wt Readings from Last 3 Encounters:  11/14/15 175 lb (79.379 kg)  10/26/15 171 lb 8 oz (77.792 kg)  10/02/15 174 lb 12 oz (79.266 kg)    GENERAL:  well developed, well nourished, overweight, not in acute distress HEENT: normocephalic, pink conjunctivae, anicteric sclerae, no xanthelasma, normal dentition, oropharynx clear NECK:  no neck vein engorgement, JVP normal, no hepatojugular reflux, carotid upstroke brisk and symmetric, no bruit, no thyromegaly, no lymphadenopathy LUNGS:  good respiratory effort, clear to auscultation bilaterally CV:  PMI not displaced, no thrills, no lifts, S1 and S2 within normal limits, no palpable S3 or S4, 3/6 HSM, no rubs, no gallops ABD:  Soft, nontender, nondistended, normoactive bowel sounds, no abdominal aortic bruit, no hepatomegaly, no splenomegaly MS: nontender back, no kyphosis, no scoliosis, no joint deformities EXT:  2+ DP/PT pulses, no edema, no varicosities, no cyanosis, no clubbing SKIN: warm, nondiaphoretic, normal turgor, no ulcers NEUROPSYCH: alert, oriented to person, place, and time, sensory/motor grossly intact, normal mood, appropriate affect  Recent Labs: 11/06/2015: ALT 17; BUN 14; Creatinine, Ser 0.85; Hemoglobin 14.1; Platelets 186.0; Potassium 3.9; Sodium 143; TSH 1.39   Lipid Panel  Component Value Date/Time   CHOL 198 11/06/2015 0836   TRIG 137.0 11/06/2015 0836   HDL 32.90* 11/06/2015 0836   CHOLHDL 6 11/06/2015 0836   VLDL 27.4 11/06/2015 0836   LDLCALC 138* 11/06/2015 0836   LDLDIRECT 144.1 03/30/2013 0829     Other studies Reviewed:  EKG:  The ekg from 10/26/2015 was personally reviewed by me and it revealed sinus rhythm, 60 BPM. Incomplete right bundle-branch block, nonspecific T-wave changes. Abnormal  EKG.  Additional studies/ records that were reviewed personally reviewed by me today include:  Echocardiogram 10/06/2015: Left ventricle: The cavity size was normal. Systolic function was  normal. The estimated ejection fraction was in the range of 60%  to 65%. Wall motion was normal; there were no regional wall  motion abnormalities. Left ventricular diastolic function  parameters were normal. - Mitral valve: There was mild regurgitation. - Left atrium: The atrium was normal in size. - Right ventricle: Systolic function was normal. - Pulmonary arteries: Systolic pressure was within the normal  range.  Ex stress test 11/03/2015:  Blood pressure demonstrated a hypertensive response to exercise. 174/77  Horizontal ST segment depression ST segment depression of 2 mm was noted during stress in the II, III, aVF, V3, V4, V5 and V6 leads.  The study is normal.  This is a low risk study.  The left ventricular ejection fraction is normal (55-65%).  Abnormal exercise ECG. However, there is LVH on baseline ECG and thus these changes might represent strain. Correlate clinically.  ASSESSMENT AND PLAN:  Mild mitral regurgitation No significant murmur auscultated. Serial evaluation recommended. Blood pressure monitoring.  Shortness of breath Incomplete right bundle branch block Patient has multiple risk factors for CAD including age, postmenopausal state, hypertension, significant family history of premature CAD. Discussed findings of stress test. No ischemia. This returns a low likelihood of clinically significant CAD. Risk factor modification recommended.   Hypertension BP is well controlled. Continue monitoring BP. Continue current medical therapy and lifestyle changes.  Family history of premature CAD  Current medicines are reviewed at length with the patient today.  The patient does not have concerns regarding medicines.  Labs/ tests ordered today include:  No orders of the  defined types were placed in this encounter.    I had a lengthy and detailed discussion with the patient regarding diagnoses, prognosis, diagnostic options, treatment options , and side effects of medications.   I counseled the patient on importance of lifestyle modification including heart healthy diet, regular physical activity.  Disposition :   FU with undersigned in one year  Signed, Wende Bushy, MD  11/14/2015 1:25 PM    [facility]

## 2015-11-14 NOTE — Patient Instructions (Addendum)
Medication Instructions:  Your physician recommends that you continue on your current medications as directed. Please refer to the Current Medication list given to you today.   Labwork: None ordered  Testing/Procedures: None ordered  Follow-Up: Your physician wants you to follow-up in: 1 year with Dr. Yvone Neu. You will receive a reminder letter in the mail two months in advance. If you don't receive a letter, please call our office to schedule the follow-up appointment.   Any Other Special Instructions Will Be Listed Below (If Applicable).     If you need a refill on your cardiac medications before your next appointment, please call your pharmacy.  Heart-Healthy Eating Plan Many factors influence your heart health, including eating and exercise habits. Heart (coronary) risk increases with abnormal blood fat (lipid) levels. Heart-healthy meal planning includes limiting unhealthy fats, increasing healthy fats, and making other small dietary changes. This includes maintaining a healthy body weight to help keep lipid levels within a normal range. WHAT IS MY PLAN?  Your health care provider recommends that you:  Get no more than _________% of the total calories in your daily diet from fat.  Limit your intake of saturated fat to less than _________% of your total calories each day.  Limit the amount of cholesterol in your diet to less than _________ mg per day. WHAT TYPES OF FAT SHOULD I CHOOSE?  Choose healthy fats more often. Choose monounsaturated and polyunsaturated fats, such as olive oil and canola oil, flaxseeds, walnuts, almonds, and seeds.  Eat more omega-3 fats. Good choices include salmon, mackerel, sardines, tuna, flaxseed oil, and ground flaxseeds. Aim to eat fish at least two times each week.  Limit saturated fats. Saturated fats are primarily found in animal products, such as meats, butter, and cream. Plant sources of saturated fats include palm oil, palm kernel oil, and  coconut oil.  Avoid foods with partially hydrogenated oils in them. These contain trans fats. Examples of foods that contain trans fats are stick margarine, some tub margarines, cookies, crackers, and other baked goods. WHAT GENERAL GUIDELINES DO I NEED TO FOLLOW?  Check food labels carefully to identify foods with trans fats or high amounts of saturated fat.  Fill one half of your plate with vegetables and green salads. Eat 4-5 servings of vegetables per day. A serving of vegetables equals 1 cup of raw leafy vegetables,  cup of raw or cooked cut-up vegetables, or  cup of vegetable juice.  Fill one fourth of your plate with whole grains. Look for the word "whole" as the first word in the ingredient list.  Fill one fourth of your plate with lean protein foods.  Eat 4-5 servings of fruit per day. A serving of fruit equals one medium whole fruit,  cup of dried fruit,  cup of fresh, frozen, or canned fruit, or  cup of 100% fruit juice.  Eat more foods that contain soluble fiber. Examples of foods that contain this type of fiber are apples, broccoli, carrots, beans, peas, and barley. Aim to get 20-30 g of fiber per day.  Eat more home-cooked food and less restaurant, buffet, and fast food.  Limit or avoid alcohol.  Limit foods that are high in starch and sugar.  Avoid fried foods.  Cook foods by using methods other than frying. Baking, boiling, grilling, and broiling are all great options. Other fat-reducing suggestions include:  Removing the skin from poultry.  Removing all visible fats from meats.  Skimming the fat off of stews, soups, and gravies  before serving them.  Steaming vegetables in water or broth.  Lose weight if you are overweight. Losing just 5-10% of your initial body weight can help your overall health and prevent diseases such as diabetes and heart disease.  Increase your consumption of nuts, legumes, and seeds to 4-5 servings per week. One serving of dried beans  or legumes equals  cup after being cooked, one serving of nuts equals 1 ounces, and one serving of seeds equals  ounce or 1 tablespoon.  You may need to monitor your salt (sodium) intake, especially if you have high blood pressure. Talk with your health care provider or dietitian to get more information about reducing sodium. WHAT FOODS CAN I EAT? Grains Breads, including [place], white, pita, wheat, raisin, rye, oatmeal, and [place]. Tortillas that are neither fried nor made with lard or trans fat. Low-fat rolls, including hotdog and hamburger buns and English muffins. Biscuits. Muffins. Waffles. Pancakes. Light popcorn. Whole-grain cereals. Flatbread. Melba toast. Pretzels. Breadsticks. Rusks. Low-fat snacks and crackers, including oyster, saltine, matzo, graham, animal, and rye. Rice and pasta, including brown rice and those that are made with whole wheat. Vegetables All vegetables. Fruits All fruits, but limit coconut. Meats and Other Protein Sources Lean, well-trimmed beef, veal, pork, and lamb. Chicken and [place] without skin. All fish and shellfish. Wild duck, rabbit, pheasant, and venison. Egg whites or low-cholesterol egg substitutes. Dried beans, peas, lentils, and tofu.Seeds and most nuts. Dairy Low-fat or nonfat cheeses, including ricotta, string, and mozzarella. Skim or 1% milk that is liquid, powdered, or evaporated. Buttermilk that is made with low-fat milk. Nonfat or low-fat yogurt. Beverages Mineral water. Diet carbonated beverages. Sweets and Desserts Sherbets and fruit ices. Honey, jam, marmalade, jelly, and syrups. Meringues and gelatins. Pure sugar candy, such as hard candy, jelly beans, gumdrops, mints, marshmallows, and small amounts of dark chocolate. [employer] Eat all sweets and desserts in moderation. Fats and Oils Nonhydrogenated (trans-free) margarines. Vegetable oils, including soybean, sesame, sunflower, olive, peanut, safflower, corn, canola, and  cottonseed. Salad dressings or mayonnaise that are made with a vegetable oil. Limit added fats and oils that you use for cooking, baking, salads, and as spreads. Other Cocoa powder. Coffee and tea. All seasonings and condiments. The items listed above may not be a complete list of recommended foods or beverages. Contact your dietitian for more options. WHAT FOODS ARE NOT RECOMMENDED? Grains Breads that are made with saturated or trans fats, oils, or whole milk. Croissants. Butter rolls. Cheese breads. Sweet rolls. Donuts. Buttered popcorn. Chow mein noodles. High-fat crackers, such as cheese or butter crackers. Meats and Other Protein Sources Fatty meats, such as hotdogs, short ribs, sausage, spareribs, bacon, ribeye roast or steak, and mutton. High-fat deli meats, such as salami and bologna. Caviar. Domestic duck and goose. Organ meats, such as kidney, liver, sweetbreads, brains, gizzard, chitterlings, and heart. Dairy Cream, sour cream, cream cheese, and creamed cottage cheese. Whole milk cheeses, including blue (bleu), Monte[place] Jack, [place], [place], American, [place], [place], [place], [place], and [place]. Whole or 2% milk that is liquid, evaporated, or condensed. Whole buttermilk. Cream sauce or high-fat cheese sauce. Yogurt that is made from whole milk. Beverages Regular sodas and drinks with added sugar. Sweets and Desserts Frosting. Pudding. Cookies. Cakes other than angel food cake. Candy that has milk chocolate or white chocolate, hydrogenated fat, butter, coconut, or unknown ingredients. Buttered syrups. Full-fat ice cream or ice cream drinks. Fats and Oils Gravy that has suet, meat fat, or shortening. Cocoa butter,  hydrogenated oils, palm oil, coconut oil, palm kernel oil. These can often be found in baked products, candy, fried foods, nondairy creamers, and whipped toppings. Solid fats and shortenings, including bacon fat, salt pork, lard, and butter. Nondairy cream substitutes, such as  coffee creamers and sour cream substitutes. Salad dressings that are made of unknown oils, cheese, or sour cream. The items listed above may not be a complete list of foods and beverages to avoid. Contact your dietitian for more information.   This information is not intended to replace advice given to you by your health care provider. Make sure you discuss any questions you have with your health care provider.   Document Released: 03/19/2008 Document Revised: 07/01/2014 Document Reviewed: 12/02/2013 Elsevier Interactive Patient Education [employer].

## 2015-11-15 ENCOUNTER — Encounter: Payer: Self-pay | Admitting: *Deleted

## 2016-01-16 ENCOUNTER — Ambulatory Visit
Admission: RE | Admit: 2016-01-16 | Discharge: 2016-01-16 | Disposition: A | Discharge status: Home / Self Care | Payer: Managed Care, Other (non HMO) | Source: Ambulatory Visit | Attending: Internal Medicine | Admitting: Internal Medicine

## 2016-01-16 DIAGNOSIS — Z1231 Encounter for screening mammogram for malignant neoplasm of breast: Secondary | ICD-10-CM | POA: Diagnosis not present

## 2016-01-16 DIAGNOSIS — Z1239 Encounter for other screening for malignant neoplasm of breast: Secondary | ICD-10-CM

## 2016-04-02 ENCOUNTER — Ambulatory Visit (INDEPENDENT_AMBULATORY_CARE_PROVIDER_SITE_OTHER): Payer: Managed Care, Other (non HMO) | Admitting: Internal Medicine

## 2016-04-02 ENCOUNTER — Encounter (INDEPENDENT_AMBULATORY_CARE_PROVIDER_SITE_OTHER): Payer: Self-pay

## 2016-04-02 ENCOUNTER — Encounter: Payer: Self-pay | Admitting: Internal Medicine

## 2016-04-02 DIAGNOSIS — I340 Nonrheumatic mitral (valve) insufficiency: Secondary | ICD-10-CM | POA: Diagnosis not present

## 2016-04-02 DIAGNOSIS — F439 Reaction to severe stress, unspecified: Secondary | ICD-10-CM | POA: Diagnosis not present

## 2016-04-02 DIAGNOSIS — R319 Hematuria, unspecified: Secondary | ICD-10-CM

## 2016-04-02 DIAGNOSIS — E7800 Pure hypercholesterolemia, unspecified: Secondary | ICD-10-CM | POA: Diagnosis not present

## 2016-04-02 DIAGNOSIS — I10 Essential (primary) hypertension: Secondary | ICD-10-CM

## 2016-04-02 MED ORDER — HYDROCHLOROTHIAZIDE 25 MG PO TABS: 25.0000 mg | ORAL_TABLET | Freq: Every day | ORAL | 5 refills | Status: DC

## 2016-04-02 MED ORDER — SERTRALINE HCL 50 MG PO TABS: 50.0000 mg | ORAL_TABLET | Freq: Every day | ORAL | 5 refills | Status: DC

## 2016-04-02 NOTE — Progress Notes (Signed)
[name], female   DOB: [birth year], 60 y.o.   MRN: [id number]   Subjective:    [name], female    DOB: August 22, 1955, 60 y.o.   MRN: [id number]  HPI  Patient here for a scheduled follow up.  She reports she is doing well.  Feels good.  Still with increased stress, but she feels she is handling things relatively well.  Saw Dr Yvone Neu.  Had ECHO 10/06/15.  EF 60-65%m mild MR.  Stress test with no ischemia.  She recommended yearly follow up.  She stays active.  No chest pain.  No sob.  No acid reflux.  No abdominal pain or cramping.  Bowels stable.     Past Medical History:  Diagnosis Date  . Hematuria   . Hypercholesterolemia   . Hypertension    Past Surgical History:  Procedure Laterality Date  . NO PAST SURGERIES     Family History  Problem Relation Age of Onset  . Diabetes Mother   . Heart disease Mother   . Hypertension Mother   . Cancer Father     prostate   . Diverticulitis Father   . CVA Father   . Heart disease Father   . Stroke Father   . Hypertension Father   . Heart murmur Father   . Heart attack Brother   . CVA Paternal Grandmother   . Breast cancer Cousin    Social History   Social History  . Marital status: Married    Spouse name: N/A  . Number of children: 2  . Years of education: N/A   Social History Main Topics  . Smoking status: Never Smoker  . Smokeless tobacco: Never Used  . Alcohol use No  . Drug use: No  . Sexual activity: Not Asked   Other Topics Concern  . None   Social History Narrative  . None    Outpatient Encounter Prescriptions as of 04/02/2016  Medication Sig  . hydrochlorothiazide (HYDRODIURIL) 25 MG tablet Take 1 tablet (25 mg total) by mouth daily.  . Multiple Vitamin (MULTIVITAMIN) tablet Take 1 tablet by mouth daily.  . sertraline (ZOLOFT) 50 MG tablet Take 1 tablet (50 mg total) by mouth daily.  . [DISCONTINUED] hydrochlorothiazide (HYDRODIURIL) 25 MG tablet Take 1 tablet (25 mg  total) by mouth daily.  . [DISCONTINUED] sertraline (ZOLOFT) 50 MG tablet Take 1 tablet (50 mg total) by mouth daily.   No facility-administered encounter medications on file as of 04/02/2016.     Review of Systems  Constitutional: Negative for appetite change and unexpected weight change.  HENT: Negative for congestion and sinus pressure.   Respiratory: Negative for cough, chest tightness and shortness of breath.   Cardiovascular: Negative for chest pain, palpitations and leg swelling.  Gastrointestinal: Negative for abdominal pain, diarrhea, nausea and vomiting.  Genitourinary: Negative for difficulty urinating and dysuria.  Musculoskeletal: Negative for back pain and joint swelling.  Skin: Negative for color change and rash.  Neurological: Negative for dizziness, light-headedness and headaches.  Psychiatric/Behavioral: Negative for agitation and dysphoric mood.       Objective:    Physical Exam  Constitutional: She appears well-developed and well-nourished. No distress.  HENT:  Nose: Nose normal.  Mouth/Throat: Oropharynx is clear and moist.  Neck: Neck supple. No thyromegaly present.  Cardiovascular: Normal rate and regular rhythm.   Pulmonary/Chest: Breath sounds normal. No respiratory distress. She has no wheezes.  Abdominal: Soft. Bowel sounds are normal. There is no tenderness.  Musculoskeletal: She exhibits no edema or tenderness.  Lymphadenopathy:    She has no cervical adenopathy.  Skin: No rash noted. No erythema.  Psychiatric: She has a normal mood and affect. Her behavior is normal.    BP 128/66   Pulse 84   Temp 98.5 F (36.9 C) (Oral)   Ht 5\' 5"  (1.651 m)   Wt 176 lb 6.4 oz (80 kg)   LMP 06/24/2010   SpO2 97%   BMI 29.35 kg/m  Wt Readings from Last 3 Encounters:  04/02/16 176 lb 6.4 oz (80 kg)  11/14/15 175 lb (79.4 kg)  10/26/15 171 lb 8 oz (77.8 kg)     Lab Results  Component Value Date   WBC 6.5 11/06/2015   HGB 14.1 11/06/2015   HCT 41.2  11/06/2015   PLT 186.0 11/06/2015   GLUCOSE 95 11/06/2015   CHOL 198 11/06/2015   TRIG 137.0 11/06/2015   HDL 32.90 (L) 11/06/2015   LDLDIRECT 144.1 03/30/2013   LDLCALC 138 (H) 11/06/2015   ALT 17 11/06/2015   AST 16 11/06/2015   NA 143 11/06/2015   K 3.9 11/06/2015   CL 108 11/06/2015   CREATININE 0.85 11/06/2015   BUN 14 11/06/2015   CO2 28 11/06/2015   TSH 1.39 11/06/2015   HGBA1C 5.2 03/30/2013    Mm Digital Screening Bilateral  Result Date: 01/16/2016 CLINICAL DATA:  Screening. EXAM: DIGITAL SCREENING BILATERAL MAMMOGRAM WITH CAD COMPARISON:  Previous exam(s). ACR Breast Density Category c: The breast tissue is heterogeneously dense, which may obscure small masses. FINDINGS: There are no findings suspicious for malignancy. Images were processed with CAD. IMPRESSION: No mammographic evidence of malignancy. A result letter of this screening mammogram will be mailed directly to the patient. RECOMMENDATION: Screening mammogram in one year. (Code:SM-B-01Y) BI-RADS CATEGORY  1: Negative. Electronically Signed   By: Fidela Salisbury M.D.   On: 01/16/2016 14:50      Assessment & Plan:   Problem List Items Addressed This Visit    Essential hypertension, benign    On hctz.  Blood pressure doing well.  Follow.        Relevant Medications   hydrochlorothiazide (HYDRODIURIL) 25 MG tablet   Hematuria    Has been evaluated and worked up previously by Dr Eliberto Ivory.  Recent urinalysis revealed 7-10 rbc's.  Discussed further w/up.  She declines.  Discussed referral back to urology.  She declines.  Will follow.        Hypercholesterolemia    Low cholesterol diet and exercise.  Follow lipid panel.        Relevant Medications   hydrochlorothiazide (HYDRODIURIL) 25 MG tablet   Other Relevant Orders   Lipid panel   Comprehensive metabolic panel   Mitral regurgitation    Mild MR noted on echo.  Continue f/u with cardiology.       Relevant Medications   hydrochlorothiazide  (HYDRODIURIL) 25 MG tablet   Stress    Increased stress.  She is on zoloft.  Overall she feels she is handling things relatively well.  Follow.         Other Visit Diagnoses   None.      Einar Pheasant, MD

## 2016-04-02 NOTE — Progress Notes (Signed)
Pre visit review using our clinic review tool, if applicable. No additional management support is needed unless otherwise documented below in the visit note. 

## 2016-04-07 ENCOUNTER — Encounter: Payer: Self-pay | Admitting: Internal Medicine

## 2016-04-07 NOTE — Assessment & Plan Note (Signed)
Increased stress.  She is on zoloft.  Overall she feels she is handling things relatively well.  Follow.

## 2016-04-07 NOTE — Assessment & Plan Note (Signed)
Mild MR noted on echo.  Continue f/u with cardiology.

## 2016-04-07 NOTE — Assessment & Plan Note (Signed)
On hctz.  Blood pressure doing well.  Follow.

## 2016-04-07 NOTE — Assessment & Plan Note (Signed)
Has been evaluated and worked up previously by Dr Eliberto Ivory.  Recent urinalysis revealed 7-10 rbc's.  Discussed further w/up.  She declines.  Discussed referral back to urology.  She declines.  Will follow.

## 2016-04-07 NOTE — Assessment & Plan Note (Signed)
Low cholesterol diet and exercise.  Follow lipid panel.   

## 2016-07-03 ENCOUNTER — Other Ambulatory Visit: Payer: Managed Care, Other (non HMO)

## 2016-09-09 ENCOUNTER — Telehealth: Payer: Self-pay | Admitting: Internal Medicine

## 2016-09-09 NOTE — Telephone Encounter (Signed)
Ok.  Keep [place] posted and let us know if need anything.

## 2016-09-09 NOTE — Telephone Encounter (Signed)
Pt called in for update and states that she really would like to have something called in. She is still in the Ed with her spouse who is coughing up blood. Please advise, thank you!  Call pt @ [phone]

## 2016-09-09 NOTE — Telephone Encounter (Signed)
If wheezing and congestion, will need to be seen.  I am unable to work in today.  I can try to work in during lunch tomorrow.  If unable, acute care open until 7:00 tonight at [facility].

## 2016-09-09 NOTE — Telephone Encounter (Signed)
Pt states that she has a sinus infection and would like something called in.. Pt states that she cant come in for an appt due to her husband being in the ED.

## 2016-09-09 NOTE — Telephone Encounter (Signed)
Reason for call: sinus infection Symptoms: headache, sinus pain, pressure, wheezing, no fever, cough,  Duration Friday  Medications: Sudafed PE  Husband in hospital with bleeding lung , 4 hour wait at walk in , please advise

## 2016-09-09 NOTE — Telephone Encounter (Signed)
Patient advised of below and she is at walk in clinic being seen now.

## 2016-09-10 NOTE — Telephone Encounter (Signed)
Spoke with patient and states she was prescribed Zpak for sinus infection .  She will let us know if she needs anything else.

## 2016-10-02 ENCOUNTER — Encounter: Payer: Self-pay | Admitting: Internal Medicine

## 2016-10-02 ENCOUNTER — Ambulatory Visit (INDEPENDENT_AMBULATORY_CARE_PROVIDER_SITE_OTHER): Payer: 59 | Admitting: Internal Medicine

## 2016-10-02 ENCOUNTER — Other Ambulatory Visit (HOSPITAL_COMMUNITY)
Admission: RE | Admit: 2016-10-02 | Discharge: 2016-10-02 | Disposition: A | Discharge status: Home / Self Care | Payer: 59 | Source: Ambulatory Visit | Attending: Internal Medicine | Admitting: Internal Medicine

## 2016-10-02 VITALS — BP 124/64 | HR 71 | Temp 98.6°F | Resp 12 | Ht 65.55 in | Wt 174.2 lb

## 2016-10-02 DIAGNOSIS — Z0000 Encounter for general adult medical examination without abnormal findings: Secondary | ICD-10-CM

## 2016-10-02 DIAGNOSIS — K625 Hemorrhage of anus and rectum: Secondary | ICD-10-CM | POA: Diagnosis not present

## 2016-10-02 DIAGNOSIS — Z124 Encounter for screening for malignant neoplasm of cervix: Secondary | ICD-10-CM | POA: Diagnosis present

## 2016-10-02 DIAGNOSIS — E7800 Pure hypercholesterolemia, unspecified: Secondary | ICD-10-CM | POA: Diagnosis not present

## 2016-10-02 DIAGNOSIS — Z1239 Encounter for other screening for malignant neoplasm of breast: Secondary | ICD-10-CM

## 2016-10-02 DIAGNOSIS — Z1231 Encounter for screening mammogram for malignant neoplasm of breast: Secondary | ICD-10-CM | POA: Diagnosis not present

## 2016-10-02 LAB — COMPREHENSIVE METABOLIC PANEL
ALBUMIN: 4.1 g/dL (ref 3.5–5.2)
ALK PHOS: 75 U/L (ref 39–117)
ALT: 15 U/L (ref 0–35)
AST: 19 U/L (ref 0–37)
BILIRUBIN TOTAL: 0.5 mg/dL (ref 0.2–1.2)
BUN: 18 mg/dL (ref 6–23)
CO2: 21 mEq/L (ref 19–32)
Calcium: 10.2 mg/dL (ref 8.4–10.5)
Chloride: 112 mEq/L (ref 96–112)
Creatinine, Ser: 0.8 mg/dL (ref 0.40–1.20)
GFR: 77.28 mL/min (ref >60.00)
GLUCOSE: 87 mg/dL (ref 70–99)
Potassium: 4 mEq/L (ref 3.5–5.1)
SODIUM: 140 meq/L (ref 135–145)
TOTAL PROTEIN: 7 g/dL (ref 6.0–8.3)

## 2016-10-02 LAB — LIPID PANEL
CHOL/HDL RATIO: 5
Cholesterol: 198 mg/dL (ref 0–200)
HDL: 40 mg/dL (ref >39.00)
LDL CALC: 135 mg/dL — AB (ref 0–99)
NONHDL: 157.6
Triglycerides: 112 mg/dL (ref 0.0–149.0)
VLDL: 22.4 mg/dL (ref 0.0–40.0)

## 2016-10-02 MED ORDER — HYDROCHLOROTHIAZIDE 25 MG PO TABS: 25.0000 mg | ORAL_TABLET | Freq: Every day | ORAL | 5 refills | Status: DC

## 2016-10-02 MED ORDER — SERTRALINE HCL 50 MG PO TABS: 50.0000 mg | ORAL_TABLET | Freq: Every day | ORAL | 5 refills | Status: DC

## 2016-10-02 NOTE — Assessment & Plan Note (Signed)
Physical today 10/02/16.  PAP 09/02/13 negative with negative HPV.  Mammogram 01/16/16 - Birads I.  PAP repeat today 10/02/16.  Refer to GI for colonoscopy.

## 2016-10-02 NOTE — Progress Notes (Signed)
[name], female   DOB: 1956/03/31, 61 y.o.   MRN: [id number]   Subjective:    [name], female    DOB: 1955-08-01, 61 y.o.   MRN: [id number]  HPI  Patient here for her physical exam.  States she is doing relatively well.  Increased stress.  Discussed with her today.  Overall she feels she is handling things relatively well.  No chest pain.  Breathing stable.  No acid reflux.  No abdominal pain.  Bowels moving.  Overdue colonoscopy.     Past Medical History:  Diagnosis Date  . Hematuria   . Hypercholesterolemia   . Hypertension    Past Surgical History:  Procedure Laterality Date  . NO PAST SURGERIES     Family History  Problem Relation Age of Onset  . Diabetes Mother   . Heart disease Mother   . Hypertension Mother   . Cancer Father     prostate   . Diverticulitis Father   . CVA Father   . Heart disease Father   . Stroke Father   . Hypertension Father   . Heart murmur Father   . Heart attack Brother   . CVA Paternal Grandmother   . Breast cancer Cousin    Social History   Social History  . Marital status: Married    Spouse name: N/A  . Number of children: 2  . Years of education: N/A   Social History Main Topics  . Smoking status: Never Smoker  . Smokeless tobacco: Never Used  . Alcohol use No  . Drug use: No  . Sexual activity: Not Asked   Other Topics Concern  . None   Social History Narrative  . None    Outpatient Encounter Prescriptions as of 10/02/2016  Medication Sig  . hydrochlorothiazide (HYDRODIURIL) 25 MG tablet Take 1 tablet (25 mg total) by mouth daily.  . Multiple Vitamin (MULTIVITAMIN) tablet Take 1 tablet by mouth daily.  . sertraline (ZOLOFT) 50 MG tablet Take 1 tablet (50 mg total) by mouth daily.  . [DISCONTINUED] hydrochlorothiazide (HYDRODIURIL) 25 MG tablet Take 1 tablet (25 mg total) by mouth daily.  . [DISCONTINUED] sertraline (ZOLOFT) 50 MG tablet Take 1 tablet (50 mg total) by mouth  daily.   No facility-administered encounter medications on file as of 10/02/2016.     Review of Systems  Constitutional: Negative for appetite change and unexpected weight change.  HENT: Negative for congestion and sinus pressure.   Eyes: Negative for pain and visual disturbance.  Respiratory: Negative for cough, chest tightness and shortness of breath.   Cardiovascular: Negative for chest pain, palpitations and leg swelling.  Gastrointestinal: Negative for abdominal pain, diarrhea, nausea and vomiting.  Genitourinary: Negative for difficulty urinating and dysuria.  Musculoskeletal: Negative for back pain and joint swelling.  Skin: Negative for color change and rash.  Neurological: Negative for dizziness, light-headedness and headaches.  Hematological: Negative for adenopathy. Does not bruise/bleed easily.  Psychiatric/Behavioral: Negative for agitation and dysphoric mood.       Objective:    Physical Exam  Constitutional: She is oriented to person, place, and time. She appears well-developed and well-nourished. No distress.  HENT:  Nose: Nose normal.  Mouth/Throat: Oropharynx is clear and moist.  Eyes: Right eye exhibits no discharge. Left eye exhibits no discharge. No scleral icterus.  Neck: Neck supple. No thyromegaly present.  Cardiovascular: Normal rate and regular rhythm.   Pulmonary/Chest: Breath sounds normal. No accessory muscle usage. No tachypnea.  No respiratory distress. She has no decreased breath sounds. She has no wheezes. She has no rhonchi. Right breast exhibits no inverted nipple, no mass, no nipple discharge and no tenderness (no axillary adenopathy). Left breast exhibits no inverted nipple, no mass, no nipple discharge and no tenderness (no axilarry adenopathy).  Abdominal: Soft. Bowel sounds are normal. There is no tenderness.  Genitourinary:  Genitourinary Comments: Normal external genitalia.  Vaginal vault without lesions.  Cervix identified.  Pap smear  performed.  Could not appreciate any adnexal masses or tenderness.    Musculoskeletal: She exhibits no edema or tenderness.  Lymphadenopathy:    She has no cervical adenopathy.  Neurological: She is alert and oriented to person, place, and time.  Skin: Skin is warm. No rash noted. No erythema.  Psychiatric: She has a normal mood and affect. Her behavior is normal.    BP 124/64 (BP Location: Left Arm, Patient Position: Sitting, Cuff Size: Normal)   Pulse 71   Temp 98.6 F (37 C) (Oral)   Resp 12   Ht 5' 5.55" (1.665 m)   Wt 174 lb 3.2 oz (79 kg)   LMP 06/24/2010   SpO2 98%   BMI 28.50 kg/m  Wt Readings from Last 3 Encounters:  10/02/16 174 lb 3.2 oz (79 kg)  04/02/16 176 lb 6.4 oz (80 kg)  11/14/15 175 lb (79.4 kg)     Lab Results  Component Value Date   WBC 6.5 11/06/2015   HGB 14.1 11/06/2015   HCT 41.2 11/06/2015   PLT 186.0 11/06/2015   GLUCOSE 87 10/02/2016   CHOL 198 10/02/2016   TRIG 112.0 10/02/2016   HDL 40.00 10/02/2016   LDLDIRECT 144.1 03/30/2013   LDLCALC 135 (H) 10/02/2016   ALT 15 10/02/2016   AST 19 10/02/2016   NA 140 10/02/2016   K 4.0 10/02/2016   CL 112 10/02/2016   CREATININE 0.80 10/02/2016   BUN 18 10/02/2016   CO2 21 10/02/2016   TSH 1.39 11/06/2015   HGBA1C 5.2 03/30/2013    Mm Digital Screening Bilateral  Result Date: 01/16/2016 CLINICAL DATA:  Screening. EXAM: DIGITAL SCREENING BILATERAL MAMMOGRAM WITH CAD COMPARISON:  Previous exam(s). ACR Breast Density Category c: The breast tissue is heterogeneously dense, which may obscure small masses. FINDINGS: There are no findings suspicious for malignancy. Images were processed with CAD. IMPRESSION: No mammographic evidence of malignancy. A result letter of this screening mammogram will be mailed directly to the patient. RECOMMENDATION: Screening mammogram in one year. (Code:SM-B-01Y) BI-RADS CATEGORY  1: Negative. Electronically Signed   By: Fidela Salisbury M.D.   On: 01/16/2016 14:50       Assessment & Plan:   Problem List Items Addressed This Visit    Health care maintenance    Physical today 10/02/16.  PAP 09/02/13 negative with negative HPV.  Mammogram 01/16/16 - Birads I.  PAP repeat today 10/02/16.  Refer to GI for colonoscopy.        Hypercholesterolemia   Relevant Medications   hydrochlorothiazide (HYDRODIURIL) 25 MG tablet   Rectal bleeding   Relevant Orders   Ambulatory referral to Gastroenterology    Other Visit Diagnoses    Screening for breast cancer    -  Primary   Relevant Orders   MM DIGITAL SCREENING BILATERAL   Screening for cervical cancer       Relevant Orders   Cytology - PAP       Einar Pheasant, MD

## 2016-10-02 NOTE — Progress Notes (Signed)
Pre-visit discussion using our clinic review tool. No additional management support is needed unless otherwise documented below in the visit note.  

## 2016-10-03 ENCOUNTER — Telehealth: Payer: Self-pay

## 2016-10-03 NOTE — Telephone Encounter (Signed)
Left message to return call to our office.  

## 2016-10-03 NOTE — Telephone Encounter (Signed)
-----   Message from Einar Pheasant, MD sent at 10/03/2016  5:07 AM EDT ----- Notify pt that her cholesterol is relatively stable when compared to previous check.  Continue a low cholesterol diet and exercise.  We will follow.  Kidney function tests and liver function tests are wnl.

## 2016-10-04 NOTE — Telephone Encounter (Signed)
Patient asked about hemoglobin I do not see it on this lab? All other results given patient requested copy of it along with information on how to start my chart. I have mailed all to her.

## 2016-10-04 NOTE — Telephone Encounter (Signed)
We checked her hemoglobin <1 year ago and it was wnl.

## 2016-10-06 ENCOUNTER — Encounter: Payer: Self-pay | Admitting: Internal Medicine

## 2016-10-07 NOTE — Telephone Encounter (Signed)
Left message to return call to our office.  

## 2016-10-08 LAB — CYTOLOGY - PAP: HPV: NOT DETECTED

## 2016-10-08 NOTE — Telephone Encounter (Signed)
Notify her that if she is able, she can come in and I will check.  Sorry for the inconvenience.  I will order if she wants to come in for check.

## 2016-10-08 NOTE — Telephone Encounter (Signed)
Patient requested a call at [phone] Ext [phone]

## 2016-10-08 NOTE — Telephone Encounter (Signed)
Office closed for lunch

## 2016-10-08 NOTE — Telephone Encounter (Signed)
Patient thought you wanted to recheck due to rectal bleeding

## 2016-10-09 NOTE — Telephone Encounter (Signed)
Left message to return call to our office.  

## 2016-10-09 NOTE — Telephone Encounter (Signed)
Patient states she will wait and have any labs drawn at GI that they need.

## 2016-10-15 ENCOUNTER — Encounter: Payer: Self-pay | Admitting: Internal Medicine

## 2016-12-17 LAB — HM COLONOSCOPY

## 2017-01-20 ENCOUNTER — Ambulatory Visit
Admission: RE | Admit: 2017-01-20 | Discharge: 2017-01-20 | Disposition: A | Discharge status: Home / Self Care | Payer: 59 | Source: Ambulatory Visit | Attending: Internal Medicine | Admitting: Internal Medicine

## 2017-01-20 DIAGNOSIS — Z1231 Encounter for screening mammogram for malignant neoplasm of breast: Secondary | ICD-10-CM | POA: Diagnosis present

## 2017-01-20 DIAGNOSIS — Z1239 Encounter for other screening for malignant neoplasm of breast: Secondary | ICD-10-CM

## 2017-04-05 ENCOUNTER — Other Ambulatory Visit: Payer: Self-pay | Admitting: Internal Medicine

## 2017-05-05 ENCOUNTER — Other Ambulatory Visit: Payer: Self-pay | Admitting: Internal Medicine

## 2017-05-05 NOTE — Telephone Encounter (Signed)
Ok to refill pt last o/v was 10/02/16 and does not have f/u at this time?

## 2017-05-06 ENCOUNTER — Other Ambulatory Visit: Payer: Self-pay

## 2017-05-06 MED ORDER — HYDROCHLOROTHIAZIDE 25 MG PO TABS: 25.0000 mg | ORAL_TABLET | Freq: Every day | ORAL | 0 refills | Status: DC

## 2017-05-06 NOTE — Telephone Encounter (Signed)
Need to schedule a f/u appt.  Ok to refill until f/u appt.

## 2017-05-06 NOTE — Telephone Encounter (Signed)
Called pt and made f/u appt for 05/19/17. Refilled script until then.

## 2017-05-19 ENCOUNTER — Ambulatory Visit: Payer: 59 | Admitting: Internal Medicine

## 2017-05-19 ENCOUNTER — Encounter: Payer: Self-pay | Admitting: Internal Medicine

## 2017-05-19 VITALS — BP 128/66 | HR 67 | Temp 98.6°F | Ht 65.5 in | Wt 171.0 lb

## 2017-05-19 DIAGNOSIS — F439 Reaction to severe stress, unspecified: Secondary | ICD-10-CM

## 2017-05-19 DIAGNOSIS — E7800 Pure hypercholesterolemia, unspecified: Secondary | ICD-10-CM

## 2017-05-19 DIAGNOSIS — R319 Hematuria, unspecified: Secondary | ICD-10-CM

## 2017-05-19 DIAGNOSIS — I10 Essential (primary) hypertension: Secondary | ICD-10-CM | POA: Diagnosis not present

## 2017-05-19 LAB — COMPREHENSIVE METABOLIC PANEL
ALBUMIN: 4.3 g/dL (ref 3.5–5.2)
ALK PHOS: 70 U/L (ref 39–117)
ALT: 18 U/L (ref 0–35)
AST: 20 U/L (ref 0–37)
BILIRUBIN TOTAL: 0.8 mg/dL (ref 0.2–1.2)
BUN: 15 mg/dL (ref 6–23)
CALCIUM: 11.2 mg/dL — AB (ref 8.4–10.5)
CO2: 29 meq/L (ref 19–32)
CREATININE: 0.86 mg/dL (ref 0.40–1.20)
Chloride: 107 mEq/L (ref 96–112)
GFR: 70.95 mL/min (ref >60.00)
Glucose, Bld: 93 mg/dL (ref 70–99)
Potassium: 4 mEq/L (ref 3.5–5.1)
Sodium: 142 mEq/L (ref 135–145)
TOTAL PROTEIN: 7.1 g/dL (ref 6.0–8.3)

## 2017-05-19 LAB — URINALYSIS, ROUTINE W REFLEX MICROSCOPIC
Bilirubin Urine: NEGATIVE
Ketones, ur: NEGATIVE
Nitrite: NEGATIVE
PH: 6 (ref 5.0–8.0)
SPECIFIC GRAVITY, URINE: 1.025 (ref 1.000–1.030)
Total Protein, Urine: NEGATIVE
URINE GLUCOSE: NEGATIVE
UROBILINOGEN UA: 0.2 (ref 0.0–1.0)

## 2017-05-19 LAB — CBC WITH DIFFERENTIAL/PLATELET
BASOS ABS: 0.1 10*3/uL (ref 0.0–0.1)
Basophils Relative: 1.2 % (ref 0.0–3.0)
EOS ABS: 0.1 10*3/uL (ref 0.0–0.7)
Eosinophils Relative: 1.2 % (ref 0.0–5.0)
HEMATOCRIT: 44.7 % (ref 36.0–46.0)
Hemoglobin: 15.1 g/dL — ABNORMAL HIGH (ref 12.0–15.0)
LYMPHS ABS: 3 10*3/uL (ref 0.7–4.0)
LYMPHS PCT: 39.2 % (ref 12.0–46.0)
MCHC: 33.8 g/dL (ref 30.0–36.0)
MCV: 90.6 fl (ref 78.0–100.0)
Monocytes Absolute: 0.4 10*3/uL (ref 0.1–1.0)
Monocytes Relative: 5.7 % (ref 3.0–12.0)
NEUTROS ABS: 4 10*3/uL (ref 1.4–7.7)
Neutrophils Relative %: 52.7 % (ref 43.0–77.0)
PLATELETS: 219 10*3/uL (ref 150.0–400.0)
RBC: 4.93 Mil/uL (ref 3.87–5.11)
RDW: 13.9 % (ref 11.5–15.5)
WBC: 7.6 10*3/uL (ref 4.0–10.5)

## 2017-05-19 NOTE — Progress Notes (Signed)
[name], female   DOB: 09-23-1955, 61 y.o.   MRN: [id number]   Subjective:    [name], female    DOB: 1956/03/29, 61 y.o.   MRN: [id number]  HPI  Patient here for a scheduled follow up.  States she is doing well.  Feels good.  Trying to stay active.  Does not exercise regularly.  No chest pain.  No sob.  No acid reflux.  No abdominal pain.  Bowels moving.  Has not noticed blood in her urine. Discussed immunizations.     Past Medical History:  Diagnosis Date  . Hematuria   . Hypercholesterolemia   . Hypertension    Past Surgical History:  Procedure Laterality Date  . NO PAST SURGERIES     Family History  Problem Relation Age of Onset  . Diabetes Mother   . Heart disease Mother   . Hypertension Mother   . Cancer Father        prostate   . Diverticulitis Father   . CVA Father   . Heart disease Father   . Stroke Father   . Hypertension Father   . Heart murmur Father   . Heart attack Brother   . CVA Paternal Grandmother   . Breast cancer Cousin    Social History   Socioeconomic History  . Marital status: Married    Spouse name: None  . Number of children: 2  . Years of education: None  . Highest education level: None  Social Needs  . Financial resource strain: None  . Food insecurity - worry: None  . Food insecurity - inability: None  . Transportation needs - medical: None  . Transportation needs - non-medical: None  Occupational History  . None  Tobacco Use  . Smoking status: Never Smoker  . Smokeless tobacco: Never Used  Substance and Sexual Activity  . Alcohol use: No    Alcohol/week: 0.0 oz  . Drug use: No  . Sexual activity: None  Other Topics Concern  . None  Social History Narrative  . None    Outpatient Encounter Medications as of 05/19/2017  Medication Sig  . hydrochlorothiazide (HYDRODIURIL) 25 MG tablet Take 1 tablet (25 mg total) daily by mouth.  . Multiple Vitamin (MULTIVITAMIN) tablet Take 1  tablet by mouth daily.  . sertraline (ZOLOFT) 50 MG tablet TAKE 1 TABLET BY MOUTH ONCE DAILY   No facility-administered encounter medications on file as of 05/19/2017.     Review of Systems  Constitutional: Negative for appetite change and unexpected weight change.  HENT: Negative for congestion and sinus pressure.   Respiratory: Negative for cough, chest tightness and shortness of breath.   Cardiovascular: Negative for chest pain, palpitations and leg swelling.  Gastrointestinal: Negative for abdominal pain, diarrhea, nausea and vomiting.  Genitourinary: Negative for difficulty urinating and dysuria.  Musculoskeletal: Negative for back pain and joint swelling.  Skin: Negative for color change and rash.  Neurological: Negative for dizziness, light-headedness and headaches.  Psychiatric/Behavioral: Negative for agitation and dysphoric mood.       Objective:     Blood pressure rechecked by me:  128/78  Physical Exam  Constitutional: She appears well-developed and well-nourished. No distress.  HENT:  Nose: Nose normal.  Mouth/Throat: Oropharynx is clear and moist.  Neck: Neck supple. No thyromegaly present.  Cardiovascular: Normal rate and regular rhythm.  2/6 systolic murmur  Pulmonary/Chest: Breath sounds normal. No respiratory distress. She has no wheezes.  Abdominal: Soft. Bowel  sounds are normal. There is no tenderness.  Musculoskeletal: She exhibits no edema or tenderness.  Lymphadenopathy:    She has no cervical adenopathy.  Skin: No rash noted. No erythema.  Psychiatric: She has a normal mood and affect. Her behavior is normal.    BP 128/66 (BP Location: Left Arm, Patient Position: Sitting, Cuff Size: Normal)   Pulse 67   Temp 98.6 F (37 C) (Oral)   Ht 5' 5.5" (1.664 m)   Wt 171 lb (77.6 kg)   LMP 06/24/2010   SpO2 97%   BMI 28.02 kg/m  Wt Readings from Last 3 Encounters:  05/19/17 171 lb (77.6 kg)  10/02/16 174 lb 3.2 oz (79 kg)  04/02/16 176 lb 6.4 oz (80  kg)     Lab Results  Component Value Date   WBC 7.6 05/19/2017   HGB 15.1 (H) 05/19/2017   HCT 44.7 05/19/2017   PLT 219.0 05/19/2017   GLUCOSE 93 05/19/2017   CHOL 198 10/02/2016   TRIG 112.0 10/02/2016   HDL 40.00 10/02/2016   LDLDIRECT 144.1 03/30/2013   LDLCALC 135 (H) 10/02/2016   ALT 18 05/19/2017   AST 20 05/19/2017   NA 142 05/19/2017   K 4.0 05/19/2017   CL 107 05/19/2017   CREATININE 0.86 05/19/2017   BUN 15 05/19/2017   CO2 29 05/19/2017   TSH 1.39 11/06/2015   HGBA1C 5.2 03/30/2013    Mm Digital Screening Bilateral  Result Date: 01/20/2017 CLINICAL DATA:  Screening. EXAM: DIGITAL SCREENING BILATERAL MAMMOGRAM WITH CAD COMPARISON:  Previous exam(s). ACR Breast Density Category c: The breast tissue is heterogeneously dense, which may obscure small masses. FINDINGS: There are no findings suspicious for malignancy. Images were processed with CAD. IMPRESSION: No mammographic evidence of malignancy. A result letter of this screening mammogram will be mailed directly to the patient. RECOMMENDATION: Screening mammogram in one year. (Code:SM-B-01Y) BI-RADS CATEGORY  1: Negative. Electronically Signed   By: Ammie Ferrier M.D.   On: 01/20/2017 10:15       Assessment & Plan:   Problem List Items Addressed This Visit    Essential hypertension, benign    Blood pressure under good control.  Continue same medication regimen.  Follow pressures.  Follow metabolic panel.        Relevant Orders   Comprehensive metabolic panel (Completed)   Hematuria - Primary    Has been evaluated and worked up previously by Dr Eliberto Ivory.  Discussed with her today.  Discussed results of last urine and discussed need for f/u. She has declined f/u with urology.  Agreed to recheck urine today.        Relevant Orders   CBC with Differential/Platelet (Completed)   Urinalysis, Routine w reflex microscopic (Completed)   Hypercholesterolemia    Low cholesterol diet and exercise.  Follow lipid panel.         Stress    On zoloft.  Discussed with her today.  She feels she is handling things relatively well.  Does not feel needs any further intervention.  Follow.            Einar Pheasant, MD

## 2017-05-20 ENCOUNTER — Other Ambulatory Visit: Payer: Self-pay | Admitting: Internal Medicine

## 2017-05-20 NOTE — Progress Notes (Signed)
Order placed for f/u lab.   

## 2017-05-21 ENCOUNTER — Encounter: Payer: Self-pay | Admitting: Internal Medicine

## 2017-05-21 NOTE — Assessment & Plan Note (Signed)
On zoloft.  Discussed with her today.  She feels she is handling things relatively well.  Does not feel needs any further intervention.  Follow.

## 2017-05-21 NOTE — Assessment & Plan Note (Signed)
Low cholesterol diet and exercise.  Follow lipid panel.   

## 2017-05-21 NOTE — Assessment & Plan Note (Signed)
Blood pressure under good control.  Continue same medication regimen.  Follow pressures.  Follow metabolic panel.   

## 2017-05-21 NOTE — Assessment & Plan Note (Signed)
Has been evaluated and worked up previously by Dr Eliberto Ivory.  Discussed with her today.  Discussed results of last urine and discussed need for f/u. She has declined f/u with urology.  Agreed to recheck urine today.

## 2017-06-09 ENCOUNTER — Other Ambulatory Visit (INDEPENDENT_AMBULATORY_CARE_PROVIDER_SITE_OTHER): Payer: 59

## 2017-06-09 LAB — CALCIUM: Calcium: 10.2 mg/dL (ref 8.4–10.5)

## 2017-06-11 ENCOUNTER — Encounter: Payer: Self-pay | Admitting: *Deleted

## 2017-06-12 ENCOUNTER — Telehealth: Payer: Self-pay | Admitting: Internal Medicine

## 2017-06-12 NOTE — Telephone Encounter (Signed)
Copied from [place]. Topic: Quick Communication - See Telephone Encounter >> Jun 12, 2017  4:16 PM Clack, Laban Emperor wrote: CRM for notification. See Telephone encounter for: Pt would like a nurse to give her a call about her lab results. Did adv pt letter was mailed out on the 19th but she would like someone to call her.  Pt would like to be called 1st on her work number on file.  06/12/17.

## 2017-10-06 ENCOUNTER — Other Ambulatory Visit: Payer: Self-pay | Admitting: Internal Medicine

## 2017-11-03 ENCOUNTER — Other Ambulatory Visit: Payer: Self-pay | Admitting: Internal Medicine

## 2017-11-19 ENCOUNTER — Encounter: Payer: 59 | Admitting: Internal Medicine

## 2017-11-27 ENCOUNTER — Other Ambulatory Visit: Payer: Self-pay | Admitting: Internal Medicine

## 2017-11-27 ENCOUNTER — Encounter: Payer: Self-pay | Admitting: Internal Medicine

## 2017-11-27 ENCOUNTER — Ambulatory Visit (INDEPENDENT_AMBULATORY_CARE_PROVIDER_SITE_OTHER): Payer: Managed Care, Other (non HMO) | Admitting: Internal Medicine

## 2017-11-27 VITALS — BP 122/72 | HR 71 | Temp 98.1°F | Resp 18 | Ht 66.0 in | Wt 171.6 lb

## 2017-11-27 DIAGNOSIS — I340 Nonrheumatic mitral (valve) insufficiency: Secondary | ICD-10-CM | POA: Diagnosis not present

## 2017-11-27 DIAGNOSIS — Z0000 Encounter for general adult medical examination without abnormal findings: Secondary | ICD-10-CM | POA: Diagnosis not present

## 2017-11-27 DIAGNOSIS — E7800 Pure hypercholesterolemia, unspecified: Secondary | ICD-10-CM | POA: Diagnosis not present

## 2017-11-27 DIAGNOSIS — R319 Hematuria, unspecified: Secondary | ICD-10-CM | POA: Diagnosis not present

## 2017-11-27 DIAGNOSIS — F439 Reaction to severe stress, unspecified: Secondary | ICD-10-CM

## 2017-11-27 DIAGNOSIS — Z1239 Encounter for other screening for malignant neoplasm of breast: Secondary | ICD-10-CM

## 2017-11-27 DIAGNOSIS — Z1231 Encounter for screening mammogram for malignant neoplasm of breast: Secondary | ICD-10-CM | POA: Diagnosis not present

## 2017-11-27 DIAGNOSIS — I10 Essential (primary) hypertension: Secondary | ICD-10-CM | POA: Diagnosis not present

## 2017-11-27 LAB — HEPATIC FUNCTION PANEL
ALBUMIN: 4.3 g/dL (ref 3.5–5.2)
ALT: 17 U/L (ref 0–35)
AST: 20 U/L (ref 0–37)
Alkaline Phosphatase: 79 U/L (ref 39–117)
Bilirubin, Direct: 0.2 mg/dL (ref 0.0–0.3)
TOTAL PROTEIN: 6.8 g/dL (ref 6.0–8.3)
Total Bilirubin: 1 mg/dL (ref 0.2–1.2)

## 2017-11-27 LAB — URINALYSIS, ROUTINE W REFLEX MICROSCOPIC
BILIRUBIN URINE: NEGATIVE
KETONES UR: NEGATIVE
NITRITE: NEGATIVE
Specific Gravity, Urine: 1.02 (ref 1.000–1.030)
TOTAL PROTEIN, URINE-UPE24: NEGATIVE
URINE GLUCOSE: NEGATIVE
Urobilinogen, UA: 0.2 (ref 0.0–1.0)
pH: 6 (ref 5.0–8.0)

## 2017-11-27 LAB — CBC WITH DIFFERENTIAL/PLATELET
BASOS PCT: 1.4 % (ref 0.0–3.0)
Basophils Absolute: 0.1 10*3/uL (ref 0.0–0.1)
EOS ABS: 0.1 10*3/uL (ref 0.0–0.7)
EOS PCT: 1.8 % (ref 0.0–5.0)
HEMATOCRIT: 42.2 % (ref 36.0–46.0)
HEMOGLOBIN: 14.8 g/dL (ref 12.0–15.0)
Lymphocytes Relative: 41 % (ref 12.0–46.0)
Lymphs Abs: 2.6 10*3/uL (ref 0.7–4.0)
MCHC: 35.1 g/dL (ref 30.0–36.0)
MCV: 87.1 fl (ref 78.0–100.0)
MONOS PCT: 6.2 % (ref 3.0–12.0)
Monocytes Absolute: 0.4 10*3/uL (ref 0.1–1.0)
NEUTROS ABS: 3.2 10*3/uL (ref 1.4–7.7)
Neutrophils Relative %: 49.6 % (ref 43.0–77.0)
PLATELETS: 197 10*3/uL (ref 150.0–400.0)
RBC: 4.85 Mil/uL (ref 3.87–5.11)
RDW: 13.9 % (ref 11.5–15.5)
WBC: 6.4 10*3/uL (ref 4.0–10.5)

## 2017-11-27 LAB — BASIC METABOLIC PANEL
BUN: 14 mg/dL (ref 6–23)
CHLORIDE: 107 meq/L (ref 96–112)
CO2: 28 mEq/L (ref 19–32)
Calcium: 10.8 mg/dL — ABNORMAL HIGH (ref 8.4–10.5)
Creatinine, Ser: 0.83 mg/dL (ref 0.40–1.20)
GFR: 73.79 mL/min (ref >60.00)
GLUCOSE: 96 mg/dL (ref 70–99)
POTASSIUM: 3.5 meq/L (ref 3.5–5.1)
SODIUM: 142 meq/L (ref 135–145)

## 2017-11-27 LAB — LIPID PANEL
CHOL/HDL RATIO: 5
Cholesterol: 192 mg/dL (ref 0–200)
HDL: 35.5 mg/dL — ABNORMAL LOW (ref >39.00)
LDL Cholesterol: 127 mg/dL — ABNORMAL HIGH (ref 0–99)
NONHDL: 156.46
TRIGLYCERIDES: 147 mg/dL (ref 0.0–149.0)
VLDL: 29.4 mg/dL (ref 0.0–40.0)

## 2017-11-27 LAB — TSH: TSH: 1.7 u[IU]/mL (ref 0.35–4.50)

## 2017-11-27 NOTE — Progress Notes (Signed)
Order placed for f/u calcium check.  

## 2017-11-27 NOTE — Assessment & Plan Note (Addendum)
Physical today 11/27/17.  PAP 09/2016 - atrophy with negative HPV. Discussed repeat pap today given atrophy.  She wants to hold on repeat this year.  Will repeat next year.   Colonoscopy 12/19/2016 - few small polyps and internal hemorrhoids.  Recommended f/u colonoscopy in 10 years.

## 2017-11-27 NOTE — Progress Notes (Signed)
[name], female   DOB: August 23, 1955, 62 y.o.   MRN: [id number]   Subjective:    [name], female    DOB: 1956/03/14, 62 y.o.   MRN: [id number]  HPI  Patient here for her physical exam.  She reports she is doing relatively well.  Still with increased stress.  Discussed with her today.  Overall she feels she is handling things relatively well.  Does not feel needs any further intervention.  Trying to stay active. No chest pain.  Breathing stable.  No acid reflux.  No abdominal pain.  Bowels moving.  Had colonoscopy.  States recommended f/u in 10 years.  Discussed shingrix.     Past Medical History:  Diagnosis Date  . Hematuria   . Hypercholesterolemia   . Hypertension    Past Surgical History:  Procedure Laterality Date  . NO PAST SURGERIES     Family History  Problem Relation Age of Onset  . Diabetes Mother   . Heart disease Mother   . Hypertension Mother   . Cancer Father        prostate   . Diverticulitis Father   . CVA Father   . Heart disease Father   . Stroke Father   . Hypertension Father   . Heart murmur Father   . Heart attack Brother   . CVA Paternal Grandmother   . Breast cancer Cousin    Social History   Socioeconomic History  . Marital status: Married    Spouse name: Not on file  . Number of children: 2  . Years of education: Not on file  . Highest education level: Not on file  Occupational History  . Not on file  Social Needs  . Financial resource strain: Not on file  . Food insecurity:    Worry: Not on file    Inability: Not on file  . Transportation needs:    Medical: Not on file    Non-medical: Not on file  Tobacco Use  . Smoking status: Never Smoker  . Smokeless tobacco: Never Used  Substance and Sexual Activity  . Alcohol use: No    Alcohol/week: 0.0 oz  . Drug use: No  . Sexual activity: Not on file  Lifestyle  . Physical activity:    Days per week: Not on file    Minutes per session: Not on  file  . Stress: Not on file  Relationships  . Social connections:    Talks on phone: Not on file    Gets together: Not on file    Attends religious service: Not on file    Active member of club or organization: Not on file    Attends meetings of clubs or organizations: Not on file    Relationship status: Not on file  Other Topics Concern  . Not on file  Social History Narrative  . Not on file    Outpatient Encounter Medications as of 11/27/2017  Medication Sig  . hydrochlorothiazide (HYDRODIURIL) 25 MG tablet TAKE 1 TABLET BY MOUTH ONCE DAILY  . Multiple Vitamin (MULTIVITAMIN) tablet Take 1 tablet by mouth daily.  . sertraline (ZOLOFT) 50 MG tablet TAKE 1 TABLET BY MOUTH ONCE DAILY   No facility-administered encounter medications on file as of 11/27/2017.     Review of Systems  Constitutional: Negative for appetite change and unexpected weight change.  HENT: Negative for congestion and sinus pressure.   Eyes: Negative for pain and visual disturbance.  Respiratory: Negative  for cough, chest tightness and shortness of breath.   Cardiovascular: Negative for chest pain, palpitations and leg swelling.  Gastrointestinal: Negative for abdominal pain, diarrhea, nausea and vomiting.  Genitourinary: Negative for difficulty urinating and dysuria.  Musculoskeletal: Negative for joint swelling and myalgias.  Skin: Negative for color change and rash.  Neurological: Negative for dizziness, light-headedness and headaches.  Hematological: Negative for adenopathy. Does not bruise/bleed easily.  Psychiatric/Behavioral: Negative for agitation and dysphoric mood.       Objective:     Blood pressure rechecked by me:  118/70  Physical Exam  Constitutional: She is oriented to person, place, and time. She appears well-developed and well-nourished. No distress.  HENT:  Nose: Nose normal.  Mouth/Throat: Oropharynx is clear and moist.  Eyes: Right eye exhibits no discharge. Left eye exhibits no  discharge. No scleral icterus.  Neck: Neck supple. No thyromegaly present.  Cardiovascular: Normal rate and regular rhythm.  2/6 systolic murmur  Pulmonary/Chest: Breath sounds normal. No accessory muscle usage. No tachypnea. No respiratory distress. She has no decreased breath sounds. She has no wheezes. She has no rhonchi. Right breast exhibits no inverted nipple, no mass, no nipple discharge and no tenderness (no axillary adenopathy). Left breast exhibits no inverted nipple, no mass, no nipple discharge and no tenderness (no axilarry adenopathy).  Abdominal: Soft. Bowel sounds are normal. There is no tenderness.  Musculoskeletal: She exhibits no edema or tenderness.  Lymphadenopathy:    She has no cervical adenopathy.  Neurological: She is alert and oriented to person, place, and time.  Skin: No rash noted. No erythema.  Psychiatric: She has a normal mood and affect. Her behavior is normal.    BP 122/72 (BP Location: Left Arm, Patient Position: Sitting, Cuff Size: Normal)   Pulse 71   Temp 98.1 F (36.7 C) (Oral)   Resp 18   Ht 5\' 6"  (1.676 m)   Wt 171 lb 9.6 oz (77.8 kg)   LMP 06/24/2010   SpO2 98%   BMI 27.70 kg/m  Wt Readings from Last 3 Encounters:  11/27/17 171 lb 9.6 oz (77.8 kg)  05/19/17 171 lb (77.6 kg)  10/02/16 174 lb 3.2 oz (79 kg)     Lab Results  Component Value Date   WBC 6.4 11/27/2017   HGB 14.8 11/27/2017   HCT 42.2 11/27/2017   PLT 197.0 11/27/2017   GLUCOSE 96 11/27/2017   CHOL 192 11/27/2017   TRIG 147.0 11/27/2017   HDL 35.50 (L) 11/27/2017   LDLDIRECT 144.1 03/30/2013   LDLCALC 127 (H) 11/27/2017   ALT 17 11/27/2017   AST 20 11/27/2017   NA 142 11/27/2017   K 3.5 11/27/2017   CL 107 11/27/2017   CREATININE 0.83 11/27/2017   BUN 14 11/27/2017   CO2 28 11/27/2017   TSH 1.70 11/27/2017   HGBA1C 5.2 03/30/2013    Mm Digital Screening Bilateral  Result Date: 01/20/2017 CLINICAL DATA:  Screening. EXAM: DIGITAL SCREENING BILATERAL MAMMOGRAM  WITH CAD COMPARISON:  Previous exam(s). ACR Breast Density Category c: The breast tissue is heterogeneously dense, which may obscure small masses. FINDINGS: There are no findings suspicious for malignancy. Images were processed with CAD. IMPRESSION: No mammographic evidence of malignancy. A result letter of this screening mammogram will be mailed directly to the patient. RECOMMENDATION: Screening mammogram in one year. (Code:SM-B-01Y) BI-RADS CATEGORY  1: Negative. Electronically Signed   By: Ammie Ferrier M.D.   On: 01/20/2017 10:15       Assessment & Plan:  Problem List Items Addressed This Visit    Essential hypertension, benign    Blood pressure under good control.  Continue same medication regimen.  Follow pressures.  Follow metabolic panel.        Relevant Orders   CBC with Differential/Platelet (Completed)   TSH (Completed)   Basic metabolic panel (Completed)   Health care maintenance    Physical today 11/27/17.  PAP 09/2016 - atrophy with negative HPV. Discussed repeat pap today given atrophy.  She wants to hold on repeat this year.  Will repeat next year.   Colonoscopy 12/19/2016 - few small polyps and internal hemorrhoids.  Recommended f/u colonoscopy in 10 years.        Hematuria    Has been evaluated and worked up by Dr Eliberto Ivory.  I have discussed referral back and further w/up given persistence.  She has declined.  Again discussed with her today.  Recheck urinalysis.        Relevant Orders   Urinalysis, Routine w reflex microscopic (Completed)   Hypercholesterolemia    Low cholesterol diet and exercise.  Follow lipid panel.        Relevant Orders   Hepatic function panel (Completed)   Lipid panel (Completed)   Mitral regurgitation    MR noted on ECHO.  Has seen cardiology.  Stable.       Stress    On zoloft.  Overall she feels she is handling things relatively well.  Does not feel she needs any further intervention.  Follow.        Other Visit Diagnoses    Routine  general medical examination at a health care facility    -  Primary   Breast cancer screening       Relevant Orders   MM 3D SCREEN BREAST BILATERAL       Einar Pheasant, MD

## 2017-11-30 ENCOUNTER — Encounter: Payer: Self-pay | Admitting: Internal Medicine

## 2017-11-30 NOTE — Assessment & Plan Note (Signed)
Blood pressure under good control.  Continue same medication regimen.  Follow pressures.  Follow metabolic panel.   

## 2017-11-30 NOTE — Assessment & Plan Note (Signed)
Low cholesterol diet and exercise.  Follow lipid panel.   

## 2017-11-30 NOTE — Assessment & Plan Note (Signed)
MR noted on ECHO.  Has seen cardiology.  Stable.

## 2017-11-30 NOTE — Assessment & Plan Note (Signed)
Has been evaluated and worked up by Dr Eliberto Ivory.  I have discussed referral back and further w/up given persistence.  She has declined.  Again discussed with her today.  Recheck urinalysis.

## 2017-11-30 NOTE — Assessment & Plan Note (Signed)
On zoloft.  Overall she feels she is handling things relatively well.  Does not feel she needs any further intervention.  Follow.

## 2017-12-11 ENCOUNTER — Telehealth: Payer: Self-pay

## 2017-12-11 NOTE — Telephone Encounter (Signed)
Copied from [place] [id number]. Topic: General - Call Back - No Documentation >> Dec 11, 2017 11:22 AM Marin Olp L wrote: Reason for CRM: Missed call. Patient never received call to review lab from last CPE>

## 2017-12-11 NOTE — Telephone Encounter (Signed)
Patient notified of labs see result note.

## 2017-12-29 ENCOUNTER — Other Ambulatory Visit: Payer: Managed Care, Other (non HMO)

## 2018-01-01 ENCOUNTER — Telehealth: Payer: Self-pay

## 2018-01-01 NOTE — Telephone Encounter (Signed)
Copied from [place] [id number]. Topic: Bill or Statement - Patient/Guarantor Inquiry >> Jan 01, 2018  4:42 PM Rutherford Nail, NT wrote: Patient calling and states that she received a bill for her CPE lab work for $159. States that she just got off the phone with [facility] Falling and they state that her visit for 11/27/17 needs to be resubmitted as a wellness check diagnostic procedure to be covered for a physical. Please advise.  CB#: [phone]

## 2018-01-02 ENCOUNTER — Telehealth: Payer: Self-pay | Admitting: *Deleted

## 2018-01-02 ENCOUNTER — Telehealth: Payer: Self-pay

## 2018-01-02 NOTE — Telephone Encounter (Signed)
[place] can you help this patient?

## 2018-01-02 NOTE — Telephone Encounter (Signed)
Copied from Mary Walter [phone]. Topic: Bill or Statement - Patient/Guarantor Inquiry >> Jan 01, 2018  4:42 PM Rutherford Nail, NT wrote: Patient calling and states that she received a bill for her CPE lab work for $159. States that she just got off the phone with [facility] Falling and they state that her visit for 11/27/17 needs to be resubmitted as a wellness check diagnostic procedure to be covered for a physical. Please advise.  CB#: [phone]

## 2018-01-02 NOTE — Telephone Encounter (Signed)
Copied from [place] [phone]. Topic: Bill or Statement - Patient/Guarantor Inquiry >> Jan 01, 2018  4:42 PM Rutherford Nail, NT wrote: Patient calling and states that she received a bill for her CPE lab work for $159. States that she just got off the phone with Holland Falling and they state that her visit for 11/27/17 needs to be resubmitted as a wellness check diagnostic procedure to be covered for a physical. Please advise.  CB#: [phone]

## 2018-01-02 NOTE — Telephone Encounter (Signed)
Lorriane Shire, can you help with this.  Is states lab work.

## 2018-01-05 ENCOUNTER — Telehealth: Payer: Self-pay

## 2018-01-05 NOTE — Telephone Encounter (Signed)
I left the patient a message regarding her lab bill. See previous note.

## 2018-01-05 NOTE — Telephone Encounter (Signed)
The CPE she had on 11/27/17 was paid in full. The $159 is a bill towards her deductible for the lab work she had done same day. The labs were billed with HTN (TSH, CBC, BMP, hematuria (urinalysis), and hyperlipidemia (lipid panel & hepatic). The labs were coded correctly according to dx on lab orders and according to the documentation by Dr. Nicki Reaper in note.

## 2018-01-05 NOTE — Telephone Encounter (Signed)
Copied from [place] [phone]. Topic: Bill or Statement - Patient/Guarantor Inquiry >> Jan 01, 2018  4:42 PM Rutherford Nail, NT wrote: Patient calling and states that she received a bill for her CPE lab work for $159. States that she just got off the phone with Holland Falling and they state that her visit for 11/27/17 needs to be resubmitted as a wellness check diagnostic procedure to be covered for a physical. Please advise.  CB#: [phone]

## 2018-01-06 ENCOUNTER — Telehealth: Payer: Self-pay

## 2018-01-06 NOTE — Telephone Encounter (Signed)
Copied from [place] [id number]. Topic: Bill or Statement - Patient/Guarantor Inquiry >> Jan 01, 2018  4:42 PM Rutherford Nail, NT wrote: Patient calling and states that she received a bill for her CPE lab work for $159. States that she just got off the phone with Holland Falling and they state that her visit for 11/27/17 needs to be resubmitted as a wellness check diagnostic procedure to be covered for a physical. Please advise.  CB#: [phone] >> Jan 05, 2018  9:55 AM Jerene Dilling H wrote: Centralized coding is unable to see coding/billing for labs. Please forward the message to the office.  Have a great day, Amy

## 2018-01-21 ENCOUNTER — Ambulatory Visit
Admission: RE | Admit: 2018-01-21 | Discharge: 2018-01-21 | Disposition: A | Discharge status: Home / Self Care | Payer: Managed Care, Other (non HMO) | Source: Ambulatory Visit | Attending: Internal Medicine | Admitting: Internal Medicine

## 2018-01-21 DIAGNOSIS — Z1231 Encounter for screening mammogram for malignant neoplasm of breast: Secondary | ICD-10-CM | POA: Insufficient documentation

## 2018-01-21 DIAGNOSIS — Z1239 Encounter for other screening for malignant neoplasm of breast: Secondary | ICD-10-CM

## 2018-04-06 ENCOUNTER — Other Ambulatory Visit: Payer: Self-pay | Admitting: Internal Medicine

## 2018-04-07 ENCOUNTER — Other Ambulatory Visit: Payer: Self-pay | Admitting: Internal Medicine

## 2018-04-07 MED ORDER — SERTRALINE HCL 50 MG PO TABS: 50.0000 mg | ORAL_TABLET | Freq: Every day | ORAL | 1 refills | Status: DC

## 2018-04-07 NOTE — Telephone Encounter (Signed)
Copied from [place] [phone]. Topic: Quick Communication - Rx Refill/Question >> Apr 07, 2018  2:03 PM Keene Breath wrote: Medication: sertraline (ZOLOFT) 50 MG tablet  Patient called to request a refill for the above medication.  Patient only has one pill left.  CB# [phone]  Preferred Pharmacy (with phone number or street name): [facility] (N), [place] [phone] (Phone) [phone] (Fax)

## 2018-04-29 ENCOUNTER — Other Ambulatory Visit: Payer: Self-pay | Admitting: Internal Medicine

## 2018-07-17 NOTE — Telephone Encounter (Signed)
Hi Mary Walter,  The patient is getting a bill for her deductible for the lab charges. They were billed correctly per documentation.  These labs were done for the patient's chronic conditions and the UA was done for hematuria.  They were not done for wellness dx.  Thanks, Sharyn Lull

## 2018-07-20 NOTE — Telephone Encounter (Signed)
Could they have been done for Wellness?

## 2018-07-21 NOTE — Telephone Encounter (Signed)
According to Dr. Bary Leriche office visit note, the UA was done for hematuria, lipid and hepatic panel were done for hypercholesterolemia, & CBC, TSH, and BMP were done for HTN.

## 2018-09-28 ENCOUNTER — Other Ambulatory Visit: Payer: Self-pay | Admitting: Internal Medicine

## 2018-10-20 ENCOUNTER — Other Ambulatory Visit: Payer: Self-pay | Admitting: Internal Medicine

## 2018-10-23 ENCOUNTER — Other Ambulatory Visit: Payer: Self-pay | Admitting: Internal Medicine

## 2018-10-29 ENCOUNTER — Telehealth: Payer: Self-pay

## 2018-10-29 NOTE — Telephone Encounter (Signed)
Called patient from recall list.  No answer. LMOV.  This is the 3rd attempt per recall list.  Will delete.

## 2018-12-30 ENCOUNTER — Other Ambulatory Visit: Payer: Self-pay | Admitting: Internal Medicine

## 2019-01-22 ENCOUNTER — Other Ambulatory Visit: Payer: Self-pay | Admitting: Internal Medicine

## 2019-02-12 ENCOUNTER — Ambulatory Visit: Payer: Managed Care, Other (non HMO) | Admitting: Internal Medicine

## 2019-04-13 NOTE — Telephone Encounter (Signed)
error 

## 2019-04-16 ENCOUNTER — Other Ambulatory Visit: Payer: Self-pay

## 2019-04-20 ENCOUNTER — Encounter: Payer: Self-pay | Admitting: Internal Medicine

## 2019-04-20 ENCOUNTER — Other Ambulatory Visit (HOSPITAL_COMMUNITY)
Admission: RE | Admit: 2019-04-20 | Discharge: 2019-04-20 | Disposition: A | Discharge status: Home / Self Care | Payer: 59 | Source: Ambulatory Visit | Attending: Internal Medicine | Admitting: Internal Medicine

## 2019-04-20 ENCOUNTER — Ambulatory Visit (INDEPENDENT_AMBULATORY_CARE_PROVIDER_SITE_OTHER): Payer: Managed Care, Other (non HMO) | Admitting: Internal Medicine

## 2019-04-20 ENCOUNTER — Other Ambulatory Visit: Payer: Self-pay

## 2019-04-20 VITALS — BP 102/70 | HR 81 | Temp 97.3°F | Resp 16 | Ht 65.5 in | Wt 168.8 lb

## 2019-04-20 DIAGNOSIS — Z0000 Encounter for general adult medical examination without abnormal findings: Secondary | ICD-10-CM

## 2019-04-20 DIAGNOSIS — F439 Reaction to severe stress, unspecified: Secondary | ICD-10-CM

## 2019-04-20 DIAGNOSIS — I10 Essential (primary) hypertension: Secondary | ICD-10-CM

## 2019-04-20 DIAGNOSIS — E7800 Pure hypercholesterolemia, unspecified: Secondary | ICD-10-CM

## 2019-04-20 DIAGNOSIS — Z124 Encounter for screening for malignant neoplasm of cervix: Secondary | ICD-10-CM | POA: Insufficient documentation

## 2019-04-20 DIAGNOSIS — Z1231 Encounter for screening mammogram for malignant neoplasm of breast: Secondary | ICD-10-CM

## 2019-04-20 DIAGNOSIS — R319 Hematuria, unspecified: Secondary | ICD-10-CM | POA: Diagnosis not present

## 2019-04-20 LAB — CBC WITH DIFFERENTIAL/PLATELET
Basophils Absolute: 0 10*3/uL (ref 0.0–0.1)
Basophils Relative: 0.6 % (ref 0.0–3.0)
Eosinophils Absolute: 0.1 10*3/uL (ref 0.0–0.7)
Eosinophils Relative: 1.3 % (ref 0.0–5.0)
HCT: 43 % (ref 36.0–46.0)
Hemoglobin: 14.9 g/dL (ref 12.0–15.0)
Lymphocytes Relative: 37.5 % (ref 12.0–46.0)
Lymphs Abs: 2.9 10*3/uL (ref 0.7–4.0)
MCHC: 34.6 g/dL (ref 30.0–36.0)
MCV: 88.2 fl (ref 78.0–100.0)
Monocytes Absolute: 0.5 10*3/uL (ref 0.1–1.0)
Monocytes Relative: 5.9 % (ref 3.0–12.0)
Neutro Abs: 4.2 10*3/uL (ref 1.4–7.7)
Neutrophils Relative %: 54.7 % (ref 43.0–77.0)
Platelets: 209 10*3/uL (ref 150.0–400.0)
RBC: 4.88 Mil/uL (ref 3.87–5.11)
RDW: 13.9 % (ref 11.5–15.5)
WBC: 7.7 10*3/uL (ref 4.0–10.5)

## 2019-04-20 LAB — COMPREHENSIVE METABOLIC PANEL
ALT: 18 U/L (ref 0–35)
AST: 25 U/L (ref 0–37)
Albumin: 4.4 g/dL (ref 3.5–5.2)
Alkaline Phosphatase: 78 U/L (ref 39–117)
BUN: 13 mg/dL (ref 6–23)
CO2: 27 mEq/L (ref 19–32)
Calcium: 11.1 mg/dL — ABNORMAL HIGH (ref 8.4–10.5)
Chloride: 107 mEq/L (ref 96–112)
Creatinine, Ser: 0.82 mg/dL (ref 0.40–1.20)
GFR: 70.09 mL/min (ref >60.00)
Glucose, Bld: 90 mg/dL (ref 70–99)
Potassium: 3.5 mEq/L (ref 3.5–5.1)
Sodium: 141 mEq/L (ref 135–145)
Total Bilirubin: 1.1 mg/dL (ref 0.2–1.2)
Total Protein: 6.9 g/dL (ref 6.0–8.3)

## 2019-04-20 LAB — LIPID PANEL
Cholesterol: 191 mg/dL (ref 0–200)
HDL: 37.2 mg/dL — ABNORMAL LOW (ref >39.00)
LDL Cholesterol: 120 mg/dL — ABNORMAL HIGH (ref 0–99)
NonHDL: 154.09
Total CHOL/HDL Ratio: 5
Triglycerides: 168 mg/dL — ABNORMAL HIGH (ref 0.0–149.0)
VLDL: 33.6 mg/dL (ref 0.0–40.0)

## 2019-04-20 LAB — URINALYSIS, ROUTINE W REFLEX MICROSCOPIC
Bilirubin Urine: NEGATIVE
Ketones, ur: NEGATIVE
Leukocytes,Ua: NEGATIVE
Nitrite: NEGATIVE
Specific Gravity, Urine: 1.02 (ref 1.000–1.030)
Total Protein, Urine: NEGATIVE
Urine Glucose: NEGATIVE
Urobilinogen, UA: 0.2 (ref 0.0–1.0)
pH: 6 (ref 5.0–8.0)

## 2019-04-20 MED ORDER — SERTRALINE HCL 50 MG PO TABS: 50.0000 mg | ORAL_TABLET | Freq: Every day | ORAL | 2 refills | Status: DC

## 2019-04-20 MED ORDER — NYSTATIN 100000 UNIT/GM EX CREA: 1.0000 "application " | TOPICAL_CREAM | Freq: Two times a day (BID) | CUTANEOUS | 0 refills | Status: DC

## 2019-04-20 MED ORDER — HYDROCHLOROTHIAZIDE 25 MG PO TABS: 25.0000 mg | ORAL_TABLET | Freq: Every day | ORAL | 2 refills | Status: DC

## 2019-04-20 NOTE — Progress Notes (Signed)
[name], female   DOB: 1956-06-09, 63 y.o.   MRN: [id number]   Subjective:    [name], female    DOB: August 15, 1955, 63 y.o.   MRN: [id number]  HPI  Patient here for her physical exam. Increased stress with family issues.  Increased stress with her daughter.  Also stress with her husband's medical issues.  Discussed with her today.  She does not feel needs any further intervention.  Tries to stay active.  No chest pain.  No sob. No acid reflux.  No abdominal pain.  Bowels moving.  Does report irritation - right side vagina.  No vaginal discharge.  No intravaginal symptoms.  Some burning when urine touches outside of vagina.     Past Medical History:  Diagnosis Date  . Hematuria   . Hypercholesterolemia   . Hypertension    Past Surgical History:  Procedure Laterality Date  . NO PAST SURGERIES     Family History  Problem Relation Age of Onset  . Diabetes Mother   . Heart disease Mother   . Hypertension Mother   . Cancer Father        prostate   . Diverticulitis Father   . CVA Father   . Heart disease Father   . Stroke Father   . Hypertension Father   . Heart murmur Father   . Heart attack Brother   . CVA Paternal Grandmother   . Breast cancer Cousin    Social History   Socioeconomic History  . Marital status: Married    Spouse name: Not on file  . Number of children: 2  . Years of education: Not on file  . Highest education level: Not on file  Occupational History  . Not on file  Social Needs  . Financial resource strain: Not on file  . Food insecurity    Worry: Not on file    Inability: Not on file  . Transportation needs    Medical: Not on file    Non-medical: Not on file  Tobacco Use  . Smoking status: Never Smoker  . Smokeless tobacco: Never Used  Substance and Sexual Activity  . Alcohol use: No    Alcohol/week: 0.0 standard drinks  . Drug use: No  . Sexual activity: Not on file  Lifestyle  . Physical activity     Days per week: Not on file    Minutes per session: Not on file  . Stress: Not on file  Relationships  . Social Herbalist on phone: Not on file    Gets together: Not on file    Attends religious service: Not on file    Active member of club or organization: Not on file    Attends meetings of clubs or organizations: Not on file    Relationship status: Not on file  Other Topics Concern  . Not on file  Social History Narrative  . Not on file    Outpatient Encounter Medications as of 04/20/2019  Medication Sig  . hydrochlorothiazide (HYDRODIURIL) 25 MG tablet Take 1 tablet (25 mg total) by mouth daily.  . Multiple Vitamin (MULTIVITAMIN) tablet Take 1 tablet by mouth daily.  Marland Kitchen nystatin cream (MYCOSTATIN) Apply 1 application topically 2 (two) times daily.  . sertraline (ZOLOFT) 50 MG tablet Take 1 tablet (50 mg total) by mouth daily.  . [DISCONTINUED] hydrochlorothiazide (HYDRODIURIL) 25 MG tablet Take 1 tablet by mouth once daily  . [DISCONTINUED] sertraline (ZOLOFT) 50  MG tablet Take 1 tablet by mouth once daily   No facility-administered encounter medications on file as of 04/20/2019.    Review of Systems  Constitutional: Negative for appetite change and unexpected weight change.  HENT: Negative for congestion and sinus pressure.   Eyes: Negative for pain and visual disturbance.  Respiratory: Negative for cough, chest tightness and shortness of breath.   Cardiovascular: Negative for chest pain, palpitations and leg swelling.  Gastrointestinal: Negative for abdominal pain, diarrhea, nausea and vomiting.  Genitourinary: Negative for difficulty urinating and dysuria.       Vaginal irritation as outlined.    Musculoskeletal: Negative for joint swelling and myalgias.  Skin: Negative for color change and rash.  Neurological: Negative for dizziness, light-headedness and headaches.  Hematological: Negative for adenopathy. Does not bruise/bleed easily.   Psychiatric/Behavioral: Negative for agitation and dysphoric mood.       Objective:    Physical Exam Constitutional:      General: She is not in acute distress.    Appearance: Normal appearance. She is well-developed.  HENT:     Head: Normocephalic and atraumatic.     Right Ear: External ear normal.     Left Ear: External ear normal.  Eyes:     General: No scleral icterus.       Right eye: No discharge.        Left eye: No discharge.     Conjunctiva/sclera: Conjunctivae normal.  Neck:     Musculoskeletal: Neck supple. No muscular tenderness.     Thyroid: No thyromegaly.  Cardiovascular:     Rate and Rhythm: Normal rate and regular rhythm.  Pulmonary:     Effort: No tachypnea, accessory muscle usage or respiratory distress.     Breath sounds: Normal breath sounds. No decreased breath sounds or wheezing.  Chest:     Breasts:        Right: No inverted nipple, mass, nipple discharge or tenderness (no axillary adenopathy).        Left: No inverted nipple, mass, nipple discharge or tenderness (no axilarry adenopathy).  Abdominal:     General: Bowel sounds are normal.     Palpations: Abdomen is soft.     Tenderness: There is no abdominal tenderness.  Genitourinary:    Comments: Right labia - erythema and irritation.  Vaginal vault without lesions.  Atrophy changes present.  Cervix identified.  Pap smear performed.  Could not appreciate any adnexal masses or tenderness.   Musculoskeletal:        General: No swelling or tenderness.  Lymphadenopathy:     Cervical: No cervical adenopathy.  Skin:    Findings: No erythema or rash.  Neurological:     Mental Status: She is alert and oriented to person, place, and time.  Psychiatric:        Mood and Affect: Mood normal.        Behavior: Behavior normal.     BP 102/70   Pulse 81   Temp (!) 97.3 F (36.3 C)   Resp 16   Ht 5' 5.5" (1.664 m)   Wt 168 lb 12.8 oz (76.6 kg)   LMP 06/24/2010   SpO2 97%   BMI 27.66 kg/m  Wt  Readings from Last 3 Encounters:  04/20/19 168 lb 12.8 oz (76.6 kg)  11/27/17 171 lb 9.6 oz (77.8 kg)  05/19/17 171 lb (77.6 kg)     Lab Results  Component Value Date   WBC 7.7 04/20/2019   HGB 14.9 04/20/2019  HCT 43.0 04/20/2019   PLT 209.0 04/20/2019   GLUCOSE 90 04/20/2019   CHOL 191 04/20/2019   TRIG 168.0 (H) 04/20/2019   HDL 37.20 (L) 04/20/2019   LDLDIRECT 144.1 03/30/2013   LDLCALC 120 (H) 04/20/2019   ALT 18 04/20/2019   AST 25 04/20/2019   NA 141 04/20/2019   K 3.5 04/20/2019   CL 107 04/20/2019   CREATININE 0.82 04/20/2019   BUN 13 04/20/2019   CO2 27 04/20/2019   TSH 1.63 04/20/2019   HGBA1C 5.2 03/30/2013    Mm 3d Screen Breast Bilateral  Result Date: 01/21/2018 CLINICAL DATA:  Screening. EXAM: DIGITAL SCREENING BILATERAL MAMMOGRAM WITH TOMO AND CAD COMPARISON:  Previous exam(s). ACR Breast Density Category c: The breast tissue is heterogeneously dense, which may obscure small masses. FINDINGS: There are no findings suspicious for malignancy. Images were processed with CAD. IMPRESSION: No mammographic evidence of malignancy. A result letter of this screening mammogram will be mailed directly to the patient. RECOMMENDATION: Screening mammogram in one year. (Code:SM-B-01Y) BI-RADS CATEGORY  1: Negative. Electronically Signed   By: Dorise Bullion III M.D   On: 01/21/2018 10:34       Assessment & Plan:   Problem List Items Addressed This Visit    Essential hypertension, benign    Blood pressure checked by me:  122/70.  Doing well on current medication regimen.  Follow pressures.  Follow metabolic panel.       Relevant Medications   hydrochlorothiazide (HYDRODIURIL) 25 MG tablet   Health care maintenance    Physical today 04/20/19.  PAP 04/20/19.  Colonoscopy 12/19/16 - few small polyps and internal hemorrhoids.  Recommended f/u colonoscopy in 10 years.  Mammogram 01/21/18 - Birads I.   Schedule f/u mammogram.        Relevant Orders   CBC with  Differential/Platelet (Completed)   Comprehensive metabolic panel (Completed)   Lipid panel (Completed)   TSH (Completed)   Hematuria    Previously worked up by Dr Yves Dill.  Recheck urine today.  She had declined further w/up with urology.        Relevant Orders   Urinalysis, Routine w reflex microscopic (Completed)   Hypercholesterolemia    Low cholesterol diet and exercise.  Follow lipid panel.        Relevant Medications   hydrochlorothiazide (HYDRODIURIL) 25 MG tablet   Stress    On zoloft.  Increased stress as outlined.  She does not feel needs any further intervention.  Follow.         Other Visit Diagnoses    Visit for screening mammogram    -  Primary   Relevant Orders   MM 3D SCREEN BREAST BILATERAL   Cervical cancer screening       Relevant Orders   Cytology - PAP( [place])       Einar Pheasant, MD

## 2019-04-20 NOTE — Assessment & Plan Note (Signed)
Blood pressure checked by me:  122/70.  Doing well on current medication regimen.  Follow pressures.  Follow metabolic panel.

## 2019-04-22 LAB — TSH: TSH: 1.63 u[IU]/mL (ref 0.35–4.50)

## 2019-04-25 ENCOUNTER — Encounter: Payer: Self-pay | Admitting: Internal Medicine

## 2019-04-25 NOTE — Assessment & Plan Note (Signed)
Low cholesterol diet and exercise.  Follow lipid panel.   

## 2019-04-25 NOTE — Assessment & Plan Note (Signed)
Physical today 04/20/19.  PAP 04/20/19.  Colonoscopy 12/19/16 - few small polyps and internal hemorrhoids.  Recommended f/u colonoscopy in 10 years.  Mammogram 01/21/18 - Birads I.   Schedule f/u mammogram.

## 2019-04-25 NOTE — Assessment & Plan Note (Signed)
Previously worked up by Dr Yves Dill.  Recheck urine today.  She had declined further w/up with urology.

## 2019-04-25 NOTE — Assessment & Plan Note (Signed)
On zoloft.  Increased stress as outlined.  She does not feel needs any further intervention.  Follow.

## 2019-04-26 LAB — CYTOLOGY - PAP
Diagnosis: NEGATIVE
High risk HPV: NEGATIVE

## 2019-04-27 ENCOUNTER — Other Ambulatory Visit: Payer: Self-pay | Admitting: Internal Medicine

## 2019-04-27 NOTE — Progress Notes (Signed)
Orders placed for f/u labs.  

## 2019-04-29 ENCOUNTER — Other Ambulatory Visit: Payer: Self-pay | Admitting: Internal Medicine

## 2019-04-29 DIAGNOSIS — R319 Hematuria, unspecified: Secondary | ICD-10-CM

## 2019-04-29 NOTE — Progress Notes (Signed)
Order placed for urology referral.  

## 2019-04-30 ENCOUNTER — Other Ambulatory Visit: Payer: Self-pay

## 2019-04-30 ENCOUNTER — Other Ambulatory Visit (INDEPENDENT_AMBULATORY_CARE_PROVIDER_SITE_OTHER): Payer: 59

## 2019-04-30 LAB — CALCIUM: Calcium: 10.5 mg/dL (ref 8.4–10.5)

## 2019-04-30 LAB — VITAMIN D 25 HYDROXY (VIT D DEFICIENCY, FRACTURES): VITD: 26.24 ng/mL — ABNORMAL LOW (ref 30.00–100.00)

## 2019-05-03 LAB — CALCIUM, IONIZED: Calcium, Ion: 5.97 mg/dL — ABNORMAL HIGH (ref 4.8–5.6)

## 2019-05-03 LAB — PARATHYROID HORMONE, INTACT (NO CA): PTH: 19 pg/mL (ref 14–64)

## 2019-05-12 ENCOUNTER — Encounter: Payer: Self-pay | Admitting: Internal Medicine

## 2019-05-12 ENCOUNTER — Other Ambulatory Visit: Payer: Self-pay

## 2019-05-12 MED ORDER — AMLODIPINE BESYLATE 5 MG PO TABS: 5.0000 mg | ORAL_TABLET | Freq: Every day | ORAL | 3 refills | Status: DC

## 2019-06-07 ENCOUNTER — Ambulatory Visit: Payer: Self-pay | Admitting: Urology

## 2019-06-16 ENCOUNTER — Other Ambulatory Visit: Payer: Self-pay

## 2019-06-16 ENCOUNTER — Ambulatory Visit (INDEPENDENT_AMBULATORY_CARE_PROVIDER_SITE_OTHER): Payer: Managed Care, Other (non HMO) | Admitting: Internal Medicine

## 2019-06-16 VITALS — BP 122/79 | Ht 65.0 in | Wt 168.0 lb

## 2019-06-16 DIAGNOSIS — E7800 Pure hypercholesterolemia, unspecified: Secondary | ICD-10-CM

## 2019-06-16 DIAGNOSIS — F439 Reaction to severe stress, unspecified: Secondary | ICD-10-CM | POA: Diagnosis not present

## 2019-06-16 DIAGNOSIS — I10 Essential (primary) hypertension: Secondary | ICD-10-CM | POA: Diagnosis not present

## 2019-06-16 DIAGNOSIS — R319 Hematuria, unspecified: Secondary | ICD-10-CM

## 2019-06-16 NOTE — Progress Notes (Signed)
[name], female   DOB: September 24, 1955, 63 y.o.   MRN: [id number]   Virtual Visit via telephone Note  This visit type was conducted due to national recommendations for restrictions regarding the COVID-19 pandemic (e.g. social distancing).  This format is felt to be most appropriate for this patient at this time.  All issues noted in this document were discussed and addressed.  No physical exam was performed (except for noted visual exam findings with Video Visits).   I connected with Galen Manila by telephone and verified that I am speaking with the correct person using two identifiers. Location patient: home Location provider: work  Persons participating in the telephone visit: patient, provider  I discussed the limitations, risks, security and privacy concerns of performing an evaluation and management service by telephone and the availability of in person appointments.  The patient expressed understanding and agreed to proceed.   Reason for visit: scheduled follow up.    HPI: She reports she is doing relatively well.  Still with increased stress.  Work and home stress.  Taking zoloft.  Does not feel she needs any further intervention.  Stays active.  No chest pain or sob reported.  Blood pressure doing well.  Blood pressure medication recently changed.  hctz stopped secondary to elevated calcium.  She was started on amlodipine.  Takes melatonin to help her sleep.  Has mammogram scheduled 07/20/19.  Persistent hematuria.  Discussed urology referral.  Wants to postpone for now.  Will notify me when agreeable.     ROS: See pertinent positives and negatives per HPI.  Past Medical History:  Diagnosis Date  . Hematuria   . Hypercholesterolemia   . Hypertension     Past Surgical History:  Procedure Laterality Date  . NO PAST SURGERIES      Family History  Problem Relation Age of Onset  . Diabetes Mother   . Heart disease Mother   . Hypertension Mother   . Cancer  Father        prostate   . Diverticulitis Father   . CVA Father   . Heart disease Father   . Stroke Father   . Hypertension Father   . Heart murmur Father   . Heart attack Brother   . CVA Paternal Grandmother   . Breast cancer Cousin     SOCIAL HX: reviewed.    Current Outpatient Medications:  .  amLODipine (NORVASC) 5 MG tablet, Take 1 tablet (5 mg total) by mouth daily., Disp: 90 tablet, Rfl: 3 .  Multiple Vitamin (MULTIVITAMIN) tablet, Take 1 tablet by mouth daily., Disp: , Rfl:  .  nystatin cream (MYCOSTATIN), Apply 1 application topically 2 (two) times daily., Disp: 30 g, Rfl: 0 .  sertraline (ZOLOFT) 50 MG tablet, Take 1 tablet (50 mg total) by mouth daily., Disp: 90 tablet, Rfl: 2  EXAM:  VITALS per patient if applicable:  [id number]  GENERAL: alert.  Sounds to be in no acute distress.  Answering questions appropriately.    PSYCH/NEURO: pleasant and cooperative, no obvious depression or anxiety, speech and thought processing grossly intact  ASSESSMENT AND PLAN:  Discussed the following assessment and plan:  Essential hypertension, benign Blood pressure under good control.  Continue same medication regimen.  Doing well on amlodipine.  Follow pressures.  Follow metabolic panel.    Hematuria Previously evaluated by Dr Yves Dill.  Persistent hematuria.  Discussed referral to urology.  She wants to postpone for now.  Will notify me when agreeable for  referral.   Hypercholesterolemia Low cholesterol diet and exercise.  Follow lipid panel.   Stress Increased stress as outlined.  Does not feel needs any further intervention.  On zoloft.  Follow.      I discussed the assessment and treatment plan with the patient. The patient was provided an opportunity to ask questions and all were answered. The patient agreed with the plan and demonstrated an understanding of the instructions.   The patient was advised to call back or seek an in-person evaluation if the symptoms worsen or if  the condition fails to improve as anticipated.  I provided 15 minutes of non-face-to-face time during this encounter.   Einar Pheasant, MD

## 2019-06-20 ENCOUNTER — Encounter: Payer: Self-pay | Admitting: Internal Medicine

## 2019-06-20 NOTE — Assessment & Plan Note (Signed)
Blood pressure under good control.  Continue same medication regimen.  Doing well on amlodipine.  Follow pressures.  Follow metabolic panel.

## 2019-06-20 NOTE — Assessment & Plan Note (Signed)
Low cholesterol diet and exercise.  Follow lipid panel.   

## 2019-06-20 NOTE — Assessment & Plan Note (Signed)
Increased stress as outlined.  Does not feel needs any further intervention.  On zoloft.  Follow.

## 2019-06-20 NOTE — Assessment & Plan Note (Signed)
Previously evaluated by Dr Yves Dill.  Persistent hematuria.  Discussed referral to urology.  She wants to postpone for now.  Will notify me when agreeable for referral.

## 2019-07-20 ENCOUNTER — Ambulatory Visit
Admission: RE | Admit: 2019-07-20 | Discharge: 2019-07-20 | Disposition: A | Discharge status: Home / Self Care | Payer: Managed Care, Other (non HMO) | Source: Ambulatory Visit | Attending: Internal Medicine | Admitting: Internal Medicine

## 2019-07-20 ENCOUNTER — Other Ambulatory Visit: Payer: Self-pay | Admitting: Internal Medicine

## 2019-07-20 DIAGNOSIS — R928 Other abnormal and inconclusive findings on diagnostic imaging of breast: Secondary | ICD-10-CM

## 2019-07-20 DIAGNOSIS — N6489 Other specified disorders of breast: Secondary | ICD-10-CM

## 2019-07-20 DIAGNOSIS — Z1231 Encounter for screening mammogram for malignant neoplasm of breast: Secondary | ICD-10-CM

## 2019-07-30 ENCOUNTER — Ambulatory Visit
Admission: RE | Admit: 2019-07-30 | Discharge: 2019-07-30 | Disposition: A | Discharge status: Home / Self Care | Payer: Managed Care, Other (non HMO) | Source: Ambulatory Visit | Attending: Internal Medicine | Admitting: Internal Medicine

## 2019-07-30 DIAGNOSIS — R928 Other abnormal and inconclusive findings on diagnostic imaging of breast: Secondary | ICD-10-CM

## 2019-07-30 DIAGNOSIS — N6489 Other specified disorders of breast: Secondary | ICD-10-CM

## 2019-08-02 ENCOUNTER — Other Ambulatory Visit: Payer: Self-pay | Admitting: Internal Medicine

## 2019-08-02 DIAGNOSIS — R928 Other abnormal and inconclusive findings on diagnostic imaging of breast: Secondary | ICD-10-CM

## 2019-08-03 ENCOUNTER — Telehealth: Payer: Self-pay | Admitting: Internal Medicine

## 2019-08-03 NOTE — Telephone Encounter (Signed)
Pt was calling to inquire about biopsy for breast. Pt is worried and would like a call back today on work phone.

## 2019-08-04 ENCOUNTER — Other Ambulatory Visit: Payer: Self-pay | Admitting: Internal Medicine

## 2019-08-04 DIAGNOSIS — R928 Other abnormal and inconclusive findings on diagnostic imaging of breast: Secondary | ICD-10-CM

## 2019-08-04 NOTE — Telephone Encounter (Signed)
Pt spoke with Dr Nicki Reaper. See result note.

## 2019-08-04 NOTE — Progress Notes (Signed)
Order placed for surgery referral.  

## 2019-08-04 NOTE — Telephone Encounter (Signed)
See result note.  I tried to call pt.  See result note.

## 2019-08-05 ENCOUNTER — Other Ambulatory Visit: Payer: Self-pay | Admitting: General Surgery

## 2019-08-10 ENCOUNTER — Other Ambulatory Visit: Payer: Self-pay | Admitting: Pathology

## 2019-08-10 LAB — SURGICAL PATHOLOGY

## 2019-09-08 ENCOUNTER — Other Ambulatory Visit: Payer: Self-pay | Admitting: General Surgery

## 2019-09-08 DIAGNOSIS — R928 Other abnormal and inconclusive findings on diagnostic imaging of breast: Secondary | ICD-10-CM

## 2019-10-15 ENCOUNTER — Other Ambulatory Visit: Payer: Managed Care, Other (non HMO)

## 2019-10-19 ENCOUNTER — Ambulatory Visit: Payer: Managed Care, Other (non HMO) | Admitting: Internal Medicine

## 2019-10-27 ENCOUNTER — Ambulatory Visit
Admission: RE | Admit: 2019-10-27 | Discharge: 2019-10-27 | Disposition: A | Discharge status: Home / Self Care | Payer: Medicare HMO | Source: Ambulatory Visit | Attending: General Surgery | Admitting: General Surgery

## 2019-10-27 ENCOUNTER — Other Ambulatory Visit: Payer: Self-pay | Admitting: General Surgery

## 2019-10-27 DIAGNOSIS — R928 Other abnormal and inconclusive findings on diagnostic imaging of breast: Secondary | ICD-10-CM

## 2019-10-27 DIAGNOSIS — R922 Inconclusive mammogram: Secondary | ICD-10-CM | POA: Diagnosis not present

## 2019-10-27 DIAGNOSIS — N6082 Other benign mammary dysplasias of left breast: Secondary | ICD-10-CM | POA: Diagnosis not present

## 2019-10-27 HISTORY — PX: BREAST BIOPSY: SHX20

## 2019-10-28 LAB — SURGICAL PATHOLOGY

## 2019-11-02 DIAGNOSIS — N6325 Unspecified lump in the left breast, overlapping quadrants: Secondary | ICD-10-CM | POA: Diagnosis not present

## 2019-11-10 ENCOUNTER — Other Ambulatory Visit: Payer: Self-pay | Admitting: General Surgery

## 2019-11-10 DIAGNOSIS — R928 Other abnormal and inconclusive findings on diagnostic imaging of breast: Secondary | ICD-10-CM

## 2019-11-10 NOTE — Progress Notes (Signed)
[id number]

## 2019-11-17 ENCOUNTER — Other Ambulatory Visit (INDEPENDENT_AMBULATORY_CARE_PROVIDER_SITE_OTHER): Payer: Medicare HMO

## 2019-11-17 ENCOUNTER — Other Ambulatory Visit: Payer: Self-pay

## 2019-11-17 DIAGNOSIS — E7800 Pure hypercholesterolemia, unspecified: Secondary | ICD-10-CM

## 2019-11-17 DIAGNOSIS — I10 Essential (primary) hypertension: Secondary | ICD-10-CM

## 2019-11-17 LAB — COMPREHENSIVE METABOLIC PANEL
ALT: 19 U/L (ref 0–35)
AST: 21 U/L (ref 0–37)
Albumin: 4.3 g/dL (ref 3.5–5.2)
Alkaline Phosphatase: 99 U/L (ref 39–117)
BUN: 14 mg/dL (ref 6–23)
CO2: 26 mEq/L (ref 19–32)
Calcium: 10.6 mg/dL — ABNORMAL HIGH (ref 8.4–10.5)
Chloride: 108 mEq/L (ref 96–112)
Creatinine, Ser: 0.88 mg/dL (ref 0.40–1.20)
GFR: 64.49 mL/min (ref >60.00)
Glucose, Bld: 91 mg/dL (ref 70–99)
Potassium: 3.9 mEq/L (ref 3.5–5.1)
Sodium: 141 mEq/L (ref 135–145)
Total Bilirubin: 0.8 mg/dL (ref 0.2–1.2)
Total Protein: 6.6 g/dL (ref 6.0–8.3)

## 2019-11-17 LAB — LIPID PANEL
Cholesterol: 186 mg/dL (ref 0–200)
HDL: 36.7 mg/dL — ABNORMAL LOW (ref >39.00)
LDL Cholesterol: 123 mg/dL — ABNORMAL HIGH (ref 0–99)
NonHDL: 149.43
Total CHOL/HDL Ratio: 5
Triglycerides: 130 mg/dL (ref 0.0–149.0)
VLDL: 26 mg/dL (ref 0.0–40.0)

## 2019-11-19 ENCOUNTER — Observation Stay
Admission: EM | Admit: 2019-11-19 | Discharge: 2019-11-20 | Disposition: A | Discharge status: Home / Self Care | Payer: Medicare HMO | Attending: Internal Medicine | Admitting: Internal Medicine

## 2019-11-19 ENCOUNTER — Encounter: Payer: Self-pay | Admitting: Emergency Medicine

## 2019-11-19 ENCOUNTER — Encounter: Payer: Self-pay | Admitting: Internal Medicine

## 2019-11-19 ENCOUNTER — Ambulatory Visit (INDEPENDENT_AMBULATORY_CARE_PROVIDER_SITE_OTHER): Payer: Medicare HMO | Admitting: Internal Medicine

## 2019-11-19 ENCOUNTER — Other Ambulatory Visit: Payer: Self-pay

## 2019-11-19 ENCOUNTER — Emergency Department: Payer: Medicare HMO

## 2019-11-19 DIAGNOSIS — R778 Other specified abnormalities of plasma proteins: Secondary | ICD-10-CM | POA: Diagnosis not present

## 2019-11-19 DIAGNOSIS — E7800 Pure hypercholesterolemia, unspecified: Secondary | ICD-10-CM | POA: Diagnosis not present

## 2019-11-19 DIAGNOSIS — I200 Unstable angina: Secondary | ICD-10-CM

## 2019-11-19 DIAGNOSIS — R9431 Abnormal electrocardiogram [ECG] [EKG]: Secondary | ICD-10-CM | POA: Insufficient documentation

## 2019-11-19 DIAGNOSIS — F329 Major depressive disorder, single episode, unspecified: Secondary | ICD-10-CM

## 2019-11-19 DIAGNOSIS — R0789 Other chest pain: Secondary | ICD-10-CM | POA: Insufficient documentation

## 2019-11-19 DIAGNOSIS — R011 Cardiac murmur, unspecified: Secondary | ICD-10-CM | POA: Diagnosis not present

## 2019-11-19 DIAGNOSIS — Z20822 Contact with and (suspected) exposure to covid-19: Secondary | ICD-10-CM | POA: Diagnosis not present

## 2019-11-19 DIAGNOSIS — R079 Chest pain, unspecified: Secondary | ICD-10-CM | POA: Diagnosis present

## 2019-11-19 DIAGNOSIS — R7989 Other specified abnormal findings of blood chemistry: Secondary | ICD-10-CM | POA: Insufficient documentation

## 2019-11-19 DIAGNOSIS — I340 Nonrheumatic mitral (valve) insufficiency: Secondary | ICD-10-CM | POA: Diagnosis not present

## 2019-11-19 DIAGNOSIS — E785 Hyperlipidemia, unspecified: Secondary | ICD-10-CM | POA: Diagnosis not present

## 2019-11-19 DIAGNOSIS — Z79899 Other long term (current) drug therapy: Secondary | ICD-10-CM | POA: Insufficient documentation

## 2019-11-19 DIAGNOSIS — Z881 Allergy status to other antibiotic agents status: Secondary | ICD-10-CM | POA: Diagnosis not present

## 2019-11-19 DIAGNOSIS — I10 Essential (primary) hypertension: Secondary | ICD-10-CM

## 2019-11-19 DIAGNOSIS — R0602 Shortness of breath: Secondary | ICD-10-CM | POA: Diagnosis not present

## 2019-11-19 DIAGNOSIS — I517 Cardiomegaly: Secondary | ICD-10-CM | POA: Diagnosis not present

## 2019-11-19 DIAGNOSIS — Z8249 Family history of ischemic heart disease and other diseases of the circulatory system: Secondary | ICD-10-CM | POA: Diagnosis not present

## 2019-11-19 DIAGNOSIS — F32A Depression, unspecified: Secondary | ICD-10-CM | POA: Insufficient documentation

## 2019-11-19 DIAGNOSIS — Z880 Allergy status to penicillin: Secondary | ICD-10-CM | POA: Insufficient documentation

## 2019-11-19 DIAGNOSIS — R69 Illness, unspecified: Secondary | ICD-10-CM | POA: Diagnosis not present

## 2019-11-19 LAB — BASIC METABOLIC PANEL
Anion gap: 9 (ref 5–15)
BUN: 15 mg/dL (ref 8–23)
CO2: 23 mmol/L (ref 22–32)
Calcium: 10.3 mg/dL (ref 8.9–10.3)
Chloride: 106 mmol/L (ref 98–111)
Creatinine, Ser: 0.9 mg/dL (ref 0.44–1.00)
GFR calc Af Amer: >60 mL/min (ref >60)
GFR calc non Af Amer: >60 mL/min (ref >60)
Glucose, Bld: 90 mg/dL (ref 70–99)
Potassium: 3.7 mmol/L (ref 3.5–5.1)
Sodium: 138 mmol/L (ref 135–145)

## 2019-11-19 LAB — CBC
HCT: 41.9 % (ref 36.0–46.0)
Hemoglobin: 14.6 g/dL (ref 12.0–15.0)
MCH: 30.5 pg (ref 26.0–34.0)
MCHC: 34.8 g/dL (ref 30.0–36.0)
MCV: 87.5 fL (ref 80.0–100.0)
Platelets: 208 10*3/uL (ref 150–400)
RBC: 4.79 MIL/uL (ref 3.87–5.11)
RDW: 13.2 % (ref 11.5–15.5)
WBC: 10 10*3/uL (ref 4.0–10.5)
nRBC: 0 % (ref 0.0–0.2)

## 2019-11-19 LAB — SARS CORONAVIRUS 2 BY RT PCR (HOSPITAL ORDER, PERFORMED IN ~~LOC~~ HOSPITAL LAB): SARS Coronavirus 2: NEGATIVE

## 2019-11-19 LAB — PROTIME-INR
INR: 1 (ref 0.8–1.2)
Prothrombin Time: 13.2 seconds (ref 11.4–15.2)

## 2019-11-19 LAB — TROPONIN I (HIGH SENSITIVITY)
Troponin I (High Sensitivity): 26 ng/L — ABNORMAL HIGH (ref <18)
Troponin I (High Sensitivity): 30 ng/L — ABNORMAL HIGH (ref <18)

## 2019-11-19 LAB — APTT: aPTT: 34 seconds (ref 24–36)

## 2019-11-19 MED ORDER — METOPROLOL TARTRATE 25 MG PO TABS
12.5000 mg | ORAL_TABLET | Freq: Two times a day (BID) | ORAL | Status: DC
  Administered 2019-11-19 – 2019-11-20 (×2): 12.5 mg via ORAL
  Filled 2019-11-19 (×2): qty 1

## 2019-11-19 MED ORDER — ASPIRIN EC 81 MG PO TBEC
81.0000 mg | DELAYED_RELEASE_TABLET | Freq: Every day | ORAL | Status: DC
  Administered 2019-11-20: 81 mg via ORAL
  Filled 2019-11-19: qty 1

## 2019-11-19 MED ORDER — ENOXAPARIN SODIUM 40 MG/0.4ML ~~LOC~~ SOLN
40.0000 mg | SUBCUTANEOUS | Status: DC
  Administered 2019-11-19: 40 mg via SUBCUTANEOUS
  Filled 2019-11-19: qty 0.4

## 2019-11-19 MED ORDER — LOSARTAN POTASSIUM 50 MG PO TABS
50.0000 mg | ORAL_TABLET | Freq: Every day | ORAL | 1 refills | Status: DC
Start: 2019-11-19 — End: 2019-11-19

## 2019-11-19 MED ORDER — ADULT MULTIVITAMIN W/MINERALS CH
1.0000 | ORAL_TABLET | Freq: Every day | ORAL | Status: DC
  Administered 2019-11-20: 1 via ORAL
  Filled 2019-11-19: qty 1

## 2019-11-19 MED ORDER — NITROGLYCERIN 0.4 MG SL SUBL: 0.4000 mg | SUBLINGUAL_TABLET | SUBLINGUAL | Status: DC | PRN

## 2019-11-19 MED ORDER — ACETAMINOPHEN 325 MG PO TABS: 650.0000 mg | ORAL_TABLET | ORAL | Status: DC | PRN

## 2019-11-19 MED ORDER — ONDANSETRON HCL 4 MG/2ML IJ SOLN: 4.0000 mg | Freq: Four times a day (QID) | INTRAMUSCULAR | Status: DC | PRN

## 2019-11-19 MED ORDER — ATORVASTATIN CALCIUM 20 MG PO TABS
40.0000 mg | ORAL_TABLET | Freq: Every day | ORAL | Status: DC
  Administered 2019-11-20: 40 mg via ORAL
  Filled 2019-11-19: qty 2

## 2019-11-19 MED ORDER — SERTRALINE HCL 50 MG PO TABS
50.0000 mg | ORAL_TABLET | Freq: Every day | ORAL | Status: DC
  Administered 2019-11-20: 50 mg via ORAL
  Filled 2019-11-19: qty 1

## 2019-11-19 MED ORDER — ASPIRIN 81 MG PO CHEW
324.0000 mg | CHEWABLE_TABLET | Freq: Once | ORAL | Status: AC
  Administered 2019-11-19: 324 mg via ORAL
  Filled 2019-11-19: qty 4

## 2019-11-19 NOTE — Assessment & Plan Note (Signed)
Blood pressure elevated.  On amlodipine 5mg  q day. Not tolerating.  Pending ER evaluation and w/up, consider decrease amlodipine and start ACE inhibitor/ARB.

## 2019-11-19 NOTE — Consult Note (Signed)
Cardiology Consultation:   Patient ID: DAMONI POSTHUMA MRN: [id number]; DOB: [birth year]  Admit date: 11/19/2019 Date of Consult: 11/19/2019  Primary Care Provider: Dale [place], MD Primary Cardiologist: new to [facility] Primary Electrophysiologist:  New to [facility] Physician requesting consult: Dr. Fanny Bien Reason for consult: EKG, chest tightness episodes   Patient Profile:   Mary Walter is a 64 y.o. female with a strong family history of coronary artery disease, mother with CABG in her 54s, brother with stent/MI in his 11s, history of murmur  dating back over 5 years, hypertension,  presenting with abnormal EKG, 2 episodes of chest tightness with vomiting  History of Present Illness:   [name] reports that she has a long history of murmur dating back over 5 years per primary care.  Previously seen by our office 2017 for murmur and shortness of breath.  Stress test at that time with no ischemia although report details LVH with diffuse ST-T wave abnormality in anterolateral leads, inferior leads. -Echocardiogram with normal LV function, no documented outflow tract obstruction  In general is active, reports she is a " country girl" that does everything.  No regular exercise program  Reports that medication changes were made in December 2020 and since then blood pressure has not been as well controlled She reports a diuretic was held at that time for calcium abnormality  She reports beginning of April 2021 she was cleaning her car, lots of squatting up and down motion.  She developed chest tightness shortness of breath and vomited.  Symptoms resolved without intervention  Had repeat episode approximately 2 weeks ago when she is carrying some heavy tote containers.  Again acute onset chest tightness shortness of breath with vomiting  She was seen her primary care physician today, EKG abnormality with LVH with strain pattern, was sent to the emergency  room Denies having any chest pain apart from the episode 2 weeks ago Feels at her baseline  Past Medical History:  Diagnosis Date  . Hematuria   . Hypercholesterolemia   . Hypertension     Past Surgical History:  Procedure Laterality Date  . BREAST BIOPSY Left 10/27/2019   Affirm bx-distortion-     clip-path pending     Home Medications:  Prior to Admission medications   Medication Sig Start Date End Date Taking? Authorizing Provider  amLODipine (NORVASC) 5 MG tablet Take 1 tablet (5 mg total) by mouth daily. 05/12/19  Yes Dale [place], MD  Multiple Vitamin (MULTIVITAMIN) tablet Take 1 tablet by mouth daily.   Yes [provider]  sertraline (ZOLOFT) 50 MG tablet Take 1 tablet (50 mg total) by mouth daily. 04/20/19  Yes Dale [place], MD  losartan (COZAAR) 50 MG tablet Take 50 mg by mouth daily. 11/19/19   [provider]  nystatin cream (MYCOSTATIN) Apply 1 application topically 2 (two) times daily. Patient not taking: Reported on 11/19/2019 04/20/19   Dale , MD    Inpatient Medications: Scheduled Meds: . [START ON 11/20/2019] aspirin EC  81 mg Oral Daily  . [START ON 11/20/2019] atorvastatin  40 mg Oral Daily  . enoxaparin (LOVENOX) injection  40 mg Subcutaneous Q24H  . metoprolol tartrate  12.5 mg Oral BID  . [START ON 11/20/2019] multivitamin with minerals  1 tablet Oral Daily  . [START ON 11/20/2019] sertraline  50 mg Oral Daily   Continuous Infusions:  PRN Meds: acetaminophen, nitroGLYCERIN, ondansetron (ZOFRAN) IV  Allergies:    Allergies  Allergen Reactions  . Macrobid [Nitrofurantoin Macrocrystal]   .  Penicillins     Childhood allergy    Social History:   Social History   Socioeconomic History  . Marital status: Married    Spouse name: Not on file  . Number of children: 2  . Years of education: Not on file  . Highest education level: Not on file  Occupational History  . Not on file  Tobacco Use  . Smoking status: Never  Smoker  . Smokeless tobacco: Never Used  Substance and Sexual Activity  . Alcohol use: No    Alcohol/week: 0.0 standard drinks  . Drug use: No  . Sexual activity: Not on file  Other Topics Concern  . Not on file  Social History Narrative  . Not on file   Social Determinants of Health   Financial Resource Strain:   . Difficulty of Paying Living Expenses:   Food Insecurity:   . Worried About Programme researcher, broadcasting/film/video in the Last Year:   . Barista in the Last Year:   Transportation Needs:   . Freight forwarder (Medical):   Marland Kitchen Lack of Transportation (Non-Medical):   Physical Activity:   . Days of Exercise per Week:   . Minutes of Exercise per Session:   Stress:   . Feeling of Stress :   Social Connections:   . Frequency of Communication with Friends and Family:   . Frequency of Social Gatherings with Friends and Family:   . Attends Religious Services:   . Active Member of Clubs or Organizations:   . Attends Banker Meetings:   Marland Kitchen Marital Status:   Intimate Partner Violence:   . Fear of Current or Ex-Partner:   . Emotionally Abused:   Marland Kitchen Physically Abused:   . Sexually Abused:     Family History:    Family History  Problem Relation Age of Onset  . Diabetes Mother   . Heart disease Mother   . Hypertension Mother   . Cancer Father        prostate   . Diverticulitis Father   . CVA Father   . Heart disease Father   . Stroke Father   . Hypertension Father   . Heart murmur Father   . Heart attack Brother   . CVA Paternal Grandmother   . Breast cancer Cousin     ROS:  Please see the history of present illness.  Review of Systems  Constitutional: Negative.   HENT: Negative.   Respiratory: Positive for shortness of breath.   Cardiovascular: Positive for chest pain.  Gastrointestinal: Positive for vomiting.  Musculoskeletal: Negative.   Neurological: Negative.   Psychiatric/Behavioral: Negative.   All other systems reviewed and are  negative.   Physical Exam/Data:   Vitals:   11/19/19 1700 11/19/19 1824 11/19/19 1900 11/19/19 1930  BP: (!) 153/83 (!) 161/80 (!) 159/74 (!) 159/66  Pulse: 70 65 71 69  Resp: 11 15 17 13   Temp:      TempSrc:      SpO2: 97% 98% 98% 96%  Weight:      Height:       No intake or output data in the 24 hours ending 11/19/19 1947 Last 3 Weights 11/19/2019 11/19/2019 06/16/2019  Weight (lbs) 171 lb 171 lb 168 lb  Weight (kg) 77.565 kg 77.565 kg 76.204 kg     Body mass index is 28.46 kg/m.  General:  Well nourished, well developed, in no acute distress HEENT: normal Lymph: no adenopathy Neck: no JVD Endocrine:  No thryomegaly Vascular: No carotid bruits; FA pulses 2+ bilaterally without bruits  Cardiac:  normal S1, S2; RRR; 2/6 SEM, murmur accentuated following Valsalva Lungs:  clear to auscultation bilaterally, no wheezing, rhonchi or rales  Abd: soft, nontender, no hepatomegaly  Ext: no edema Musculoskeletal:  No deformities, BUE and BLE strength normal and equal Skin: warm and dry  Neuro:  CNs 2-12 intact, no focal abnormalities noted Psych:  Normal affect   EKG:  The EKG was personally reviewed and demonstrates:   Normal sinus rhythm with LVH, strain pattern anterolateral ST-T wave abnormality anterolateral leads inferior leads  Telemetry:  Telemetry was personally reviewed and demonstrates: Normal sinus rhythm  Relevant CV Studies: Echocardiogram pending  Laboratory Data:  High Sensitivity Troponin:   Recent Labs  Lab 11/19/19 1625 11/19/19 1843  TROPONINIHS 26* 30*     Chemistry Recent Labs  Lab 11/17/19 0840 11/19/19 1625  NA 141 138  K 3.9 3.7  CL 108 106  CO2 26 23  GLUCOSE 91 90  BUN 14 15  CREATININE 0.88 0.90  CALCIUM 10.6* 10.3  GFRNONAA  --  >60  GFRAA  --  >60  ANIONGAP  --  9    Recent Labs  Lab 11/17/19 0840  PROT 6.6  ALBUMIN 4.3  AST 21  ALT 19  ALKPHOS 99  BILITOT 0.8   Hematology Recent Labs  Lab 11/19/19 1625  WBC 10.0   RBC 4.79  HGB 14.6  HCT 41.9  MCV 87.5  MCH 30.5  MCHC 34.8  RDW 13.2  PLT 208   BNPNo results for input(s): BNP, PROBNP in the last 168 hours.  DDimer No results for input(s): DDIMER in the last 168 hours.   Radiology/Studies:  DG Chest 2 View  Result Date: 11/19/2019 CLINICAL DATA:  Chest pain chest pressure and fatigue EXAM: CHEST - 2 VIEW COMPARISON:  None. FINDINGS: The heart size and mediastinal contours are within normal limits. Both lungs are clear. The visualized skeletal structures are unremarkable. IMPRESSION: No active cardiopulmonary disease. Electronically Signed   By: Jasmine Pang M.D.   On: 11/19/2019 17:57   {   Assessment and Plan:   Chest pain/shortness of breath/vomiting Initial troponin 26 repeat 30 Rare episodes  2 weeks ago, also episode beginning of April 2021 associated with either heavy lifting or squatting down standing up/washing car -Given murmur, EKG consistent with LVH and strain pattern, family history of hypertrophy/father,  Echocardiogram has been ordered to exclude cardiac hypertrophy with outflow tract obstruction -Previous echocardiogram from 2017 reviewed, challenging images at that time, may need Definity if unable to well-visualized -Of note EKG during stress test 2017 had similar LVH strain pattern as detailed in report -Murmur accentuated with Valsalva on exam -At baseline with activity she does not have reproducible chest pain concerning for angina.  With that said she does have strong family history of coronary disease, mother with CABG, brother with MI in their 25s -----Would consider outpatient cardiac CTA for further evaluation of LVH, outflow tract and coronary artery disease --We will hold off on Myoview tomorrow, and perform outpatient ischemic work-up.  She may be best served by cardiac CTA/even MRI rather than Myoview  Essential hypertension She reports blood pressure has been elevated at home Consider continuing metoprolol  with amlodipine    Total encounter time more than 110 minutes  Greater than 50% was spent in counseling and coordination of care with the patient  For questions or updates, please contact [facility] Please  consult [email or web address] for contact info under     Signed, Julien Nordmann, MD  11/19/2019 7:47 PM

## 2019-11-19 NOTE — Assessment & Plan Note (Signed)
Increased chest tightness, head pounding, vomiting and elevated blood pressure with increased exertion.  Two "bad" episodes.  Some bloating today.  EKG - ST depression and TWI I and aVL and V3 - V6.  Discussed with cardiology.  Discussed with pt.  Given recent symptoms and EKG changes concern regarding possible ischemia.  Discussed the need for ER evaluation and further w/up.  She agreed.  She declined ambulance transportation.  Declined to have someone drive her.  ER notified.  Pt to ER for further evaluation and treatment.

## 2019-11-19 NOTE — ED Notes (Signed)
Call back for report from floor  Report to [place], [place], finished at 2003

## 2019-11-19 NOTE — ED Triage Notes (Signed)
Patient sent by PCP for EKG changes. Patient reports she has had intermittent chest pressure, fatigue and SOB since January. States no real worsening of symptoms, but had routine appointment today and EKG was done due to described symptoms.

## 2019-11-19 NOTE — Patient Instructions (Addendum)
Decrease amlodipine to 2.5mg  per day and start losartan 50mg  - one per day.  Take this in the evening.    Start losartan 50mg  - one per day.

## 2019-11-19 NOTE — ED Notes (Signed)
Blue, green, and lavender tubes sent to lab

## 2019-11-19 NOTE — ED Notes (Signed)
Dr Rockey Situ, cardiology,  at bedside.  introduced self to pt

## 2019-11-19 NOTE — ED Provider Notes (Signed)
[facility] Emergency Department Provider Note   ____________________________________________   First MD Initiated Contact with Patient 11/19/19 1720     (approximate)  I have reviewed the triage vital signs and the nursing notes.   HISTORY  Chief Complaint Chest Pain    HPI   A 64 year old patient with a history of hypercholesterolemia presents for evaluation of chest pain. Initial onset of pain was more than 6 hours ago. The patient's chest pain is described as heaviness/pressure/tightness and is worse with exertion. The patient complains of nausea. The patient's chest pain is middle- or left-sided, is not well-localized, is not sharp and does radiate to the arms/jaw/neck. The patient denies diaphoresis. The patient has a family history of coronary artery disease in a first-degree relative with onset less than age 59. The patient has no history of stroke, has no history of peripheral artery disease, has not smoked in the past 90 days, denies any history of treated diabetes, is not hypertensive and does not have an elevated BMI (>=30).   Currently chest pain-free.  Last episode of chest pain was about 2 weeks ago.  Went to primary care today who did an EKG found to be very abnormal and referred her to the ER.   Past Medical History:  Diagnosis Date  . Hematuria   . Hypercholesterolemia   . Hypertension     Patient Active Problem List   Diagnosis Date Noted  . Chest tightness 11/19/2019  . Chest pain 11/19/2019  . Elevated troponin   . Depression   . Mitral regurgitation 10/26/2015  . Shortness of breath 10/26/2015  . Incomplete right bundle branch block 10/26/2015  . Family history of premature CAD 10/26/2015  . Health care maintenance 09/06/2014  . Stress 03/13/2014  . Hematuria 10/11/2012  . Hypercholesterolemia 10/11/2012  . Essential hypertension 10/11/2012    Past Surgical History:  Procedure Laterality Date  . BREAST BIOPSY Left  10/27/2019   Affirm bx-distortion-     clip-path pending    Prior to Admission medications   Medication Sig Start Date End Date Taking? Authorizing Provider  amLODipine (NORVASC) 5 MG tablet Take 1 tablet (5 mg total) by mouth daily. 05/12/19  Yes Einar Pheasant, MD  Multiple Vitamin (MULTIVITAMIN) tablet Take 1 tablet by mouth daily.   Yes [provider]  sertraline (ZOLOFT) 50 MG tablet Take 1 tablet (50 mg total) by mouth daily. 04/20/19  Yes Einar Pheasant, MD  losartan (COZAAR) 50 MG tablet Take 50 mg by mouth daily. 11/19/19   [provider]  nystatin cream (MYCOSTATIN) Apply 1 application topically 2 (two) times daily. Patient not taking: Reported on 11/19/2019 04/20/19   Einar Pheasant, MD    Allergies Macrobid [nitrofurantoin macrocrystal] and Penicillins  Family History  Problem Relation Age of Onset  . Diabetes Mother   . Heart disease Mother   . Hypertension Mother   . Cancer Father        prostate   . Diverticulitis Father   . CVA Father   . Heart disease Father   . Stroke Father   . Hypertension Father   . Heart murmur Father   . Heart attack Brother   . CVA Paternal Grandmother   . Breast cancer Cousin     Social History Social History   Tobacco Use  . Smoking status: Never Smoker  . Smokeless tobacco: Never Used  Substance Use Topics  . Alcohol use: No    Alcohol/week: 0.0 standard drinks  .  Drug use: No    Review of Systems Constitutional: No fever/chills Eyes: No visual changes. ENT: No sore throat. Cardiovascular: See HPI Respiratory: Denies shortness of breath.  No history of blood clots. Gastrointestinal: No abdominal pain.   Genitourinary: Negative for dysuria. Musculoskeletal: Negative for back pain.  No leg swelling or calf pain. Skin: Negative for rash. Neurological: Negative for headaches, areas of focal weakness or numbness.    ____________________________________________   PHYSICAL EXAM:  VITAL  SIGNS: ED Triage Vitals [11/19/19 1618]  Enc Vitals Group     BP (!) 153/88     Pulse Rate 76     Resp 16     Temp 98 F (36.7 C)     Temp Source Oral     SpO2 98 %     Weight 171 lb (77.6 kg)     Height 5\' 5"  (1.651 m)     Head Circumference      Peak Flow      Pain Score 0     Pain Loc      Pain Edu?      Excl. in [place]?     Constitutional: Alert and oriented. Well appearing and in no acute distress. Eyes: Conjunctivae are normal. Head: Atraumatic. Nose: No congestion/rhinnorhea. Mouth/Throat: Mucous membranes are moist. Neck: No stridor.  Cardiovascular: Normal rate, regular rhythm. Grossly normal heart sounds.  Good peripheral circulation. Respiratory: Normal respiratory effort.  No retractions. Lungs CTAB. Gastrointestinal: Soft and nontender. No distention. Musculoskeletal: No lower extremity tenderness nor edema. Neurologic:  Normal speech and language. No gross focal neurologic deficits are appreciated.  Skin:  Skin is warm, dry and intact. No rash noted. Psychiatric: Mood and affect are normal. Speech and behavior are normal.  ____________________________________________   LABS (all labs ordered are listed, but only abnormal results are displayed)  Labs Reviewed  TROPONIN I (HIGH SENSITIVITY) - Abnormal; Notable for the following components:      Result Value   Troponin I (High Sensitivity) 26 (*)    All other components within normal limits  TROPONIN I (HIGH SENSITIVITY) - Abnormal; Notable for the following components:   Troponin I (High Sensitivity) 30 (*)    All other components within normal limits  SARS CORONAVIRUS 2 BY RT PCR (HOSPITAL ORDER, [place] [facility])  BASIC METABOLIC PANEL  CBC  PROTIME-INR  APTT  HIV ANTIBODY (ROUTINE TESTING W REFLEX)  LIPID PANEL   ____________________________________________  EKG  Reviewed inter by me at 1610 Heart rate 79 QRS 120 QTc 430 Normal sinus rhythm, probable left ventricular  hypertrophy with significant repolarization abnormality, potential ischemic changes also noted as compared with her previous twelve-lead from 2017.  However in discussion with Dr. Rockey Situ, he was able to review a stress test EKG that had a somewhat similar appearance to today's. ____________________________________________  RADIOLOGY  DG Chest 2 View  Result Date: 11/19/2019 CLINICAL DATA:  Chest pain chest pressure and fatigue EXAM: CHEST - 2 VIEW COMPARISON:  None. FINDINGS: The heart size and mediastinal contours are within normal limits. Both lungs are clear. The visualized skeletal structures are unremarkable. IMPRESSION: No active cardiopulmonary disease. Electronically Signed   By: Donavan Foil M.D.   On: 11/19/2019 17:57    Chest x-ray reviewed, no acute ____________________________________________   PROCEDURES  Procedure(s) performed: None  Procedures  Critical Care performed: No  ____________________________________________   INITIAL IMPRESSION / ASSESSMENT AND PLAN / ED COURSE  Pertinent labs & imaging results that were available during  my care of the patient were reviewed by me and considered in my medical decision making (see chart for details).   Differential diagnosis includes, but is not limited to, ACS, aortic dissection, pulmonary embolism, cardiac tamponade, pneumothorax, pneumonia, pericarditis, myocarditis, GI-related causes including esophagitis/gastritis, and musculoskeletal chest wall pain.    No moving radiating or tearing pain.  Intermittent exertional chest pain, last occurring about 2 weeks ago.  Now with a very abnormal EKG.  Minimally elevated troponin.  Provide salicylates, have discussed with Dr. Rockey Situ will observe the patient overnight for chest pain and serial troponins as well as obtain cardiology consultation.  Clinical Course as of Nov 19 2038  Fri Nov 19, 2019  1654 Paged cardiology, Dr. Rockey Situ.  Patient currently asymptomatic pain-free    [MQ]    Clinical Course User Index [MQ] Delman Kitten, MD    ----------------------------------------- 5:22 PM on 11/19/2019 -----------------------------------------  Patient updated on plan of care including plan for admission for chest pain observation and cardiology consult overnight.  Additionally, patient remains pain-free.  Discussed admission with Dr. Bobbye Charleston of the hospitalist service ____________________________________________   FINAL CLINICAL IMPRESSION(S) / ED DIAGNOSES  Final diagnoses:  Unstable angina [facility])        Note:  This document was prepared using Dragon voice recognition software and may include unintentional dictation errors       Delman Kitten, MD 11/19/19 2040

## 2019-11-19 NOTE — ED Provider Notes (Addendum)
HEAR Score: 5       Delman Kitten, MD 11/19/19 2041

## 2019-11-19 NOTE — Assessment & Plan Note (Addendum)
The 10-year ASCVD risk score Mikey Bussing DC Brooke Bonito., et al., 2013) is: 11.4%   Values used to calculate the score:     Age: 64 years     Sex: Female     Is Non-Hispanic African American: No     Diabetic: No     Tobacco smoker: No     Systolic Blood Pressure: [id number] mmHg     Is BP treated: Yes     HDL Cholesterol: 36.7 mg/dL     Total Cholesterol: 186 mg/dL   Discussed calculated cholesterol risk.  Discussed starting crestor.

## 2019-11-19 NOTE — H&P (Signed)
[place] [facility] [place] NAME: Mary Walter    MR#:  [id number]  DATE OF BIRTH:  06-20-1956  DATE OF ADMISSION:  11/19/2019  PRIMARY CARE PHYSICIAN: Einar Pheasant, MD   REQUESTING/REFERRING PHYSICIAN: Dr Delman Kitten  CHIEF COMPLAINT:   Chief Complaint  Patient presents with  . Chest Pain    HISTORY OF PRESENT ILLNESS:  Mary Walter  is a 64 y.o. female with a known history of hypertension presenting with symptoms over the past few months.  In December she had changes in her blood pressure medication.  In January things were not right.  She started developing some shortness of breath and her ankles were swollen.  She can hear her heart beating in her ears.  1-1/2 months ago she was washing her car and had some nausea vomiting and shortness of breath.  2 weeks ago she was carrying something heavy and also had shortness of breath and vomiting.  She has has occasional chest tightness with hard work feeling like a pressure in the center of her chest.  Moderate severity.  No numbness or tingling or sweating episodes.  She was seen by her PCP and sent into the ER for further evaluation.  EKG showing left atrial enlargement, left ventricular hypertrophy flipped T waves laterally.  First troponin borderline at 26.  PAST MEDICAL HISTORY:   Past Medical History:  Diagnosis Date  . Hematuria   . Hypercholesterolemia   . Hypertension     PAST SURGICAL HISTORY:   Past Surgical History:  Procedure Laterality Date  . BREAST BIOPSY Left 10/27/2019   Affirm bx-distortion-     clip-path pending    SOCIAL HISTORY:   Social History   Tobacco Use  . Smoking status: Never Smoker  . Smokeless tobacco: Never Used  Substance Use Topics  . Alcohol use: No    Alcohol/week: 0.0 standard drinks    FAMILY HISTORY:   Family History  Problem Relation Age of Onset  . Diabetes Mother   . Heart disease Mother   . Hypertension Mother   . Cancer  Father        prostate   . Diverticulitis Father   . CVA Father   . Heart disease Father   . Stroke Father   . Hypertension Father   . Heart murmur Father   . Heart attack Brother   . CVA Paternal Grandmother   . Breast cancer Cousin     DRUG ALLERGIES:   Allergies  Allergen Reactions  . Macrobid [Nitrofurantoin Macrocrystal]   . Penicillins     REVIEW OF SYSTEMS:  CONSTITUTIONAL: No fever, chills or sweats.  No sweating episodes.  Some weight gain.  Some fatigue.  EYES: Wears glasses and seen the eye doctor for redness in her left eye EARS, NOSE, AND THROAT: No tinnitus or ear pain. No sore throat RESPIRATORY: Positive for shortness of breath.  No cough CARDIOVASCULAR: Positive for chest pressure. GASTROINTESTINAL: Some nausea and vomiting.no diarrhea or abdominal pain. No blood in bowel movements GENITOURINARY: No dysuria, hematuria.  ENDOCRINE: No polyuria, nocturia,  HEMATOLOGY: No anemia, easy bruising or bleeding SKIN: No rash or lesion. MUSCULOSKELETAL: No joint pain or arthritis.   NEUROLOGIC: No tingling, numbness, weakness.  PSYCHIATRY: No anxiety or depression.   MEDICATIONS AT HOME:   Prior to Admission medications   Medication Sig Start Date End Date Taking? Authorizing Provider  amLODipine (NORVASC) 5 MG tablet Take 1 tablet (5 mg total) by mouth daily.  05/12/19   Einar Pheasant, MD  Multiple Vitamin (MULTIVITAMIN) tablet Take 1 tablet by mouth daily.    [provider]  nystatin cream (MYCOSTATIN) Apply 1 application topically 2 (two) times daily. 04/20/19   Einar Pheasant, MD  sertraline (ZOLOFT) 50 MG tablet Take 1 tablet (50 mg total) by mouth daily. 04/20/19   Einar Pheasant, MD      VITAL SIGNS:  Blood pressure (!) 153/83, pulse 70, temperature 98 F (36.7 C), temperature source Oral, resp. rate 11, height 5\' 5"  (1.651 m), weight 77.6 kg, last menstrual period 06/24/2010, SpO2 97 %.  PHYSICAL EXAMINATION:  GENERAL:  64 y.o.-year-old  patient lying in the bed with no acute distress.  EYES: Pupils equal, round, reactive to light and accommodation. No scleral icterus. HEENT: Head atraumatic, normocephalic. NECK: No bruits LUNGS: Normal breath sounds bilaterally, no wheezing, rales,rhonchi or crepitation. No use of accessory muscles of respiration.  CARDIOVASCULAR: S1, S2 normal.  3/6 systolic murmur, no rubs, or gallops.  ABDOMEN: Soft, nontender, nondistended. Bowel sounds present. No organomegaly or mass.  EXTREMITIES: No pedal edema, cyanosis, or clubbing.  NEUROLOGIC: Cranial nerves II through XII are intact. Muscle strength 5/5 in all extremities. Sensation intact. Gait not checked.  PSYCHIATRIC: The patient is alert and oriented x 3.  SKIN: No rash, lesion, or ulcer.   LABORATORY PANEL:   CBC Recent Labs  Lab 11/19/19 1625  WBC 10.0  HGB 14.6  HCT 41.9  PLT 208   ------------------------------------------------------------------------------------------------------------------  Chemistries  Recent Labs  Lab 11/17/19 0840 11/17/19 0840 11/19/19 1625  NA 141   < > 138  K 3.9   < > 3.7  CL 108   < > 106  CO2 26   < > 23  GLUCOSE 91   < > 90  BUN 14   < > 15  CREATININE 0.88   < > 0.90  CALCIUM 10.6*   < > 10.3  AST 21  --   --   ALT 19  --   --   ALKPHOS 99  --   --   BILITOT 0.8  --   --    < > = values in this interval not displayed.   ------------------------------------------------------------------------------------------------------------------  Cardiac Enzymes High-sensitivity troponin 26  RADIOLOGY:  Chest x-ray is negative  EKG:   Normal sinus rhythm 73 beats minute, left atrial enlargement, LVH, flipped T waves laterally  IMPRESSION AND PLAN:   1.  Chest pain, shortness of breath.  Borderline elevated troponin at 26.  Check a second troponin get a echocardiogram.  If second troponin stays flat consider a stress test for tomorrow morning.  Cardiology consultation message [facility]  cardiology.  Aspirin, metoprolol and Lipitor.  Check a lipid profile.  Nitroglycerin as needed. 2.  Essential hypertension change Norvasc over to metoprolol. 3.  Depression on Zoloft  All the records, laboratory and radiological data are reviewed and case discussed with ED provider. Management plans discussed with the patient, and she is in agreement.  She deferred me calling family at this time  CODE STATUS: Full Code  TOTAL TIME TAKING CARE OF THIS PATIENT: 50 minutes.    Loletha Grayer M.D on 11/19/2019 at 5:56 PM  Between 7am to 6pm - Pager - [phone]  After 6pm call admission pager [phone]  [facility]  CC: Primary care physician; Einar Pheasant, MD

## 2019-11-19 NOTE — ED Notes (Signed)
First call report, Baxter Flattery reports RN just went into contact room, will call this RN back for report in a few minutes, phone number given

## 2019-11-19 NOTE — Assessment & Plan Note (Signed)
MR noted on ECHO - mild.   Murmur noted on exam.  EKG as outlined.  ER w/up planned.

## 2019-11-19 NOTE — Progress Notes (Signed)
[name], female   DOB: Jun 14, 1956, 64 y.o.   MRN: [id number]   Subjective:    [name], female    DOB: 02-13-1956, 64 y.o.   MRN: [id number]  HPI This visit occurred during the SARS-CoV-2 public health emergency.  Safety protocols were in place, including screening questions prior to the visit, additional usage of staff PPE, and extensive cleaning of exam room while observing appropriate contact time as indicated for disinfecting solutions.  Patient here for a scheduled follow up.  Recently had problems with elevated calcium.  hctz was stopped and she was placed on amlodipine 5mg  q day.  She reports reports two episodes of chest tightness, head pounding and nausea/vomiting with increased exertion.  She feels bloated today.  No chest pain or tightness now.  Has noticed some weight gain and increased ankle edema recently.  Eating.  No cough or congestion.  Handling stress.    Past Medical History:  Diagnosis Date   Hematuria    Hypercholesterolemia    Hypertension    Past Surgical History:  Procedure Laterality Date   BREAST BIOPSY Left 10/27/2019   Affirm bx-distortion-     clip-path pending   NO PAST SURGERIES     Family History  Problem Relation Age of Onset   Diabetes Mother    Heart disease Mother    Hypertension Mother    Cancer Father        prostate    Diverticulitis Father    CVA Father    Heart disease Father    Stroke Father    Hypertension Father    Heart murmur Father    Heart attack Brother    CVA Paternal Grandmother    Breast cancer Cousin    Social History   Socioeconomic History   Marital status: Married    Spouse name: Not on file   Number of children: 2   Years of education: Not on file   Highest education level: Not on file  Occupational History   Not on file  Tobacco Use   Smoking status: Never Smoker   Smokeless tobacco: Never Used  Substance and Sexual Activity   Alcohol  use: No    Alcohol/week: 0.0 standard drinks   Drug use: No   Sexual activity: Not on file  Other Topics Concern   Not on file  Social History Narrative   Not on file   Social Determinants of Health   Financial Resource Strain:    Difficulty of Paying Living Expenses:   Food Insecurity:    Worried About Charity fundraiser in the Last Year:    Arboriculturist in the Last Year:   Transportation Needs:    Film/video editor (Medical):    Lack of Transportation (Non-Medical):   Physical Activity:    Days of Exercise per Week:    Minutes of Exercise per Session:   Stress:    Feeling of Stress :   Social Connections:    Frequency of Communication with Friends and Family:    Frequency of Social Gatherings with Friends and Family:    Attends Religious Services:    Active Member of Clubs or Organizations:    Attends Archivist Meetings:    Marital Status:     Outpatient Encounter Medications as of 11/19/2019  Medication Sig   amLODipine (NORVASC) 5 MG tablet Take 1 tablet (5 mg total) by mouth daily.   Multiple Vitamin (MULTIVITAMIN) tablet  Take 1 tablet by mouth daily.   nystatin cream (MYCOSTATIN) Apply 1 application topically 2 (two) times daily.   sertraline (ZOLOFT) 50 MG tablet Take 1 tablet (50 mg total) by mouth daily.   [DISCONTINUED] losartan (COZAAR) 50 MG tablet Take 1 tablet (50 mg total) by mouth daily.   No facility-administered encounter medications on file as of 11/19/2019.    Review of Systems  Constitutional: Negative for appetite change and fatigue.       Weight gain.    HENT: Negative for congestion and sinus pressure.   Respiratory: Positive for chest tightness. Negative for cough.        No increased sob.    Cardiovascular: Positive for leg swelling. Negative for palpitations.  Gastrointestinal:       Previous abdominal bloating, nausea and vomiting.    Genitourinary: Negative for difficulty urinating and dysuria.    Musculoskeletal: Negative for joint swelling and myalgias.  Skin: Negative for color change and rash.  Psychiatric/Behavioral: Negative for agitation and dysphoric mood.       Objective:    Physical Exam Vitals reviewed.  Constitutional:      General: She is not in acute distress.    Appearance: Normal appearance.  HENT:     Head: Normocephalic and atraumatic.  Neck:     Thyroid: No thyromegaly.  Cardiovascular:     Rate and Rhythm: Normal rate and regular rhythm.     Comments: [id number] systolic murmyr Pulmonary:     Effort: No respiratory distress.     Breath sounds: Normal breath sounds. No wheezing.  Abdominal:     General: Bowel sounds are normal.     Palpations: Abdomen is soft.     Tenderness: There is no abdominal tenderness.  Musculoskeletal:        General: No tenderness.     Cervical back: Neck supple.     Comments: Minimal ankle swelling.    Lymphadenopathy:     Cervical: No cervical adenopathy.  Skin:    Findings: No erythema or rash.  Neurological:     Mental Status: She is alert.  Psychiatric:        Mood and Affect: Mood normal.        Behavior: Behavior normal.     BP (!) 156/80    Pulse 76    Temp 97.6 F (36.4 C)    Resp 16    Ht 5\' 5"  (1.651 m)    Wt 171 lb (77.6 kg)    LMP 06/24/2010    SpO2 98%    BMI 28.46 kg/m  Wt Readings from Last 3 Encounters:  11/19/19 171 lb (77.6 kg)  06/16/19 168 lb (76.2 kg)  04/20/19 168 lb 12.8 oz (76.6 kg)     Lab Results  Component Value Date   WBC 7.7 04/20/2019   HGB 14.9 04/20/2019   HCT 43.0 04/20/2019   PLT 209.0 04/20/2019   GLUCOSE 91 11/17/2019   CHOL 186 11/17/2019   TRIG 130.0 11/17/2019   HDL 36.70 (L) 11/17/2019   LDLDIRECT 144.1 03/30/2013   LDLCALC 123 (H) 11/17/2019   ALT 19 11/17/2019   AST 21 11/17/2019   NA 141 11/17/2019   K 3.9 11/17/2019   CL 108 11/17/2019   CREATININE 0.88 11/17/2019   BUN 14 11/17/2019   CO2 26 11/17/2019   TSH 1.63 04/20/2019   HGBA1C 5.2 03/30/2013     MM DIAG BREAST TOMO UNI LEFT  Result Date: 10/27/2019 CLINICAL DATA:  64 year old female  presenting today for a stereotactic biopsy of a left breast distortion. She had an ultrasound-guided biopsy by Dr. Bary Castilla of a retroareolar left breast mass at 12 o'clock. These initial images prior to stereotactic biopsy are to evaluate clip position following ultrasound-guided biopsy, as well as for assistance in localizing the distortion for biopsy positioning. EXAM: DIGITAL DIAGNOSTIC UNILATERAL LEFT MAMMOGRAM WITH CAD AND TOMO COMPARISON:  Previous exam(s). ACR Breast Density Category c: The breast tissue is heterogeneously dense, which may obscure small masses. FINDINGS: The coil shaped biopsy marking clip from the recent ultrasound-guided biopsy is seen in the 12 o'clock position in the superior anterior left breast. The distortion in the lateral aspect of the left breast, posterior depth is again seen on the CC view, but no definite correlate is seen on the full paddle true lateral or spot tomosynthesis MLO views. There is a possible subtle distortion in the superior posterior left breast, however this is likely too far posterior to correspond with the distortion on the CC view. Mammographic images were processed with CAD. IMPRESSION: 1. The biopsy marking clip from the ultrasound guided biopsy performed in Dr. Edmund Hilda office is seen in the superior anterior left breast. 2. The subtle distortion in the lateral left breast is again seen on the CC view, and will be biopsied under stereotactic guided biopsy today. This will be dictated in a separate report. 3. The questioned distortion in the superior posterior left breast may are may not correspond with the distortion seen on the CC view. This will be further evaluated on the post biopsy mammogram. If they do not correspond, than an additional attempted stereotactic biopsy would be recommended. RECOMMENDATION: 1. Stereotactic biopsy of the lateral left breast  distortion which will be performed today and dictated in a separate report. 2. As stated in the impression, if the distortion in the CC view does not correspond with the possible subtle distortion on the lateral view, then an additional stereotactic biopsy is recommended. Please see the post biopsy mammogram images performed after the biopsy for final recommendations. I have discussed the findings and recommendations with the patient. If applicable, a reminder letter will be sent to the patient regarding the next appointment. BI-RADS CATEGORY  4: Suspicious. Electronically Signed   By: Ammie Ferrier M.D.   On: 10/27/2019 13:59   MM CLIP PLACEMENT LEFT  Result Date: 10/27/2019 CLINICAL DATA:  Post biopsy mammogram of the left breast for clip placement. EXAM: DIAGNOSTIC LEFT MAMMOGRAM POST STEREOTACTIC BIOPSY COMPARISON:  Previous exam(s). FINDINGS: Mammographic images were obtained following stereotactic guided biopsy of distortion in the lateral posterior left breast. The biopsy marking clip is in expected position at the site of biopsy. The biopsy marking clip does not align with another subtle possible area of distortion which was seen in the upper outer far posterior left breast. IMPRESSION: 1. Appropriate positioning of the X shaped biopsy marking clip at the site of biopsy in the lateral posterior. 2. The coil shaped biopsy marking clip is seen in the superior anterior left breast at the site of the ultrasound guided biopsy performed by Dr. Bary Castilla (pathology states this is "at least atypical ductal hyperplasia", with differential including intraductal papilloma with ADH or DCIS, papillary DCIS and encapsulated papillary carcinoma. 3. The X shaped biopsy marking clip does not align with the possible subtle distortion in the superior far posterior left breast. Attempted stereotactic biopsy will be recommended for this site. 4. Given the subtle appearance of 2 distortions in the left breast, and  the  pathology results from the ultrasound-guided biopsy, as well as the density of the patient's breast tissue, MRI is recommended. This would preferably be done before the second stereotactic biopsy. Final Assessment: Post Procedure Mammograms for Marker Placement Electronically Signed   By: Ammie Ferrier M.D.   On: 10/27/2019 14:46   MM LT BREAST BX W LOC DEV 1ST LESION IMAGE BX SPEC STEREO GUIDE  Addendum Date: 11/02/2019   ADDENDUM REPORT: 11/01/2019 14:20 ADDENDUM: PATHOLOGY revealed: A. BREAST, LEFT LATERAL POSTERIOR; STEREOTACTIC CORE NEEDLE BIOPSY: - BENIGN FIBROEPITHELIAL LESION WITH SCLEROSIS. - NEGATIVE FOR ATYPICAL PROLIFERATIVE BREAST DISEASE. Comment: The differential diagnosis includes complex fibroadenoma, intraductal papilloma, or complex sclerosing lesion. The previous biopsy displaying fragments of papillary epithelial proliferation, with at least atypical ductal hyperplasia ([id number]) is noted, and reviewed in conjunction with the current case. Atypical hyperplasia is not identified within the current sampling. Pathology results are CONCORDANT with imaging findings, per Dr. Ammie Ferrier. Pathology results and recommendations below were discussed with patient by telephone on 10/29/2019. Patient reported biopsy site doing well with slight tenderness at the site. Post biopsy care instructions were reviewed and questions were answered. Patient was instructed to call [facility] if any concerns or questions arise related to the biopsy. Recommendation: 1. Surgical referral. Request for surgical referral was relayed to [place] [facility] at [facility] by Electa Sniff RN on 10/29/2019. Deneice Breedlove RT was informed patient has appointment with Dr. Ollen Bowl (surgeon) on Nov 02, 2019 to discuss biopsy results and recommendations. 2. Prior to surgical excision, recommend LEFT breast stereotactic biopsy for additional possible distortion seen in the upper outer LEFT  breast. 3. Prior to LEFT breast stereotactic biopsy procedure stated in #2 above, recommend bilateral breast MRI to assess for possible LEFT breast distortion. Addendum by Electa Sniff RN on 11/01/2019. Electronically Signed   By: Curlene Dolphin M.D.   On: 11/01/2019 14:20   Result Date: 11/02/2019 CLINICAL DATA:  64 year old female presenting for stereotactic biopsy of a left breast distortion. EXAM: LEFT BREAST STEREOTACTIC CORE NEEDLE BIOPSY COMPARISON:  Previous exams. FINDINGS: The patient and I discussed the procedure of stereotactic-guided biopsy including benefits and alternatives. We discussed the high likelihood of a successful procedure. We discussed the risks of the procedure including infection, bleeding, tissue injury, clip migration, and inadequate sampling. Informed written consent was given. The usual time out protocol was performed immediately prior to the procedure. Using sterile technique and 1% Lidocaine as local anesthetic, under stereotactic guidance, a 9 gauge vacuum assisted device was used to perform core needle biopsy of distortion in the lateral posterior left breast using a superior approach. Lesion quadrant: Lower outer quadrant At the conclusion of the procedure, X shaped tissue marker clip was deployed into the biopsy cavity. Follow-up 2-view mammogram was performed and dictated separately. IMPRESSION: Stereotactic-guided biopsy of distortion in the lateral posterior left breast. No apparent complications. Electronically Signed: By: Ammie Ferrier M.D. On: 10/27/2019 14:39       Assessment & Plan:   Problem List Items Addressed This Visit    Chest tightness    Increased chest tightness, head pounding, vomiting and elevated blood pressure with increased exertion.  Two "bad" episodes.  Some bloating today.  EKG - ST depression and TWI I and aVL and V3 - V6.  Discussed with cardiology.  Discussed with pt.  Given recent symptoms and EKG changes concern regarding possible  ischemia.  Discussed the need for ER evaluation and further w/up.  She  agreed.  She declined ambulance transportation.  Declined to have someone drive her.  ER notified.  Pt to ER for further evaluation and treatment.        Relevant Orders   EKG 12-Lead   Essential hypertension, benign    Blood pressure elevated.  On amlodipine 5mg  q day. Not tolerating.  Pending ER evaluation and w/up, consider decrease amlodipine and start ACE inhibitor/ARB.        Hypercholesterolemia    The 10-year ASCVD risk score Mikey Bussing DC Jr., et al., 2013) is: 11.4%   Values used to calculate the score:     Age: 87 years     Sex: Female     Is Non-Hispanic African American: No     Diabetic: No     Tobacco smoker: No     Systolic Blood Pressure: A999333 mmHg     Is BP treated: Yes     HDL Cholesterol: 36.7 mg/dL     Total Cholesterol: 186 mg/dL   Discussed calculated cholesterol risk.  Discussed starting crestor.        Mitral regurgitation    MR noted on ECHO - mild.   Murmur noted on exam.  EKG as outlined.  ER w/up planned.            Einar Pheasant, MD

## 2019-11-20 ENCOUNTER — Observation Stay (HOSPITAL_BASED_OUTPATIENT_CLINIC_OR_DEPARTMENT_OTHER)
Admit: 2019-11-20 | Discharge: 2019-11-20 | Disposition: A | Discharge status: Home / Self Care | Payer: Medicare HMO | Attending: Cardiovascular Disease | Admitting: Cardiovascular Disease

## 2019-11-20 DIAGNOSIS — R69 Illness, unspecified: Secondary | ICD-10-CM | POA: Diagnosis not present

## 2019-11-20 DIAGNOSIS — R079 Chest pain, unspecified: Secondary | ICD-10-CM

## 2019-11-20 DIAGNOSIS — E785 Hyperlipidemia, unspecified: Secondary | ICD-10-CM

## 2019-11-20 DIAGNOSIS — R778 Other specified abnormalities of plasma proteins: Secondary | ICD-10-CM | POA: Diagnosis not present

## 2019-11-20 DIAGNOSIS — I10 Essential (primary) hypertension: Secondary | ICD-10-CM | POA: Diagnosis not present

## 2019-11-20 DIAGNOSIS — R0602 Shortness of breath: Secondary | ICD-10-CM

## 2019-11-20 LAB — LIPID PANEL
Cholesterol: 201 mg/dL — ABNORMAL HIGH (ref 0–200)
HDL: 35 mg/dL — ABNORMAL LOW (ref >40)
LDL Cholesterol: 135 mg/dL — ABNORMAL HIGH (ref 0–99)
Total CHOL/HDL Ratio: 5.7 RATIO
Triglycerides: 157 mg/dL — ABNORMAL HIGH (ref <150)
VLDL: 31 mg/dL (ref 0–40)

## 2019-11-20 LAB — HIV ANTIBODY (ROUTINE TESTING W REFLEX): HIV Screen 4th Generation wRfx: NONREACTIVE

## 2019-11-20 LAB — ECHOCARDIOGRAM COMPLETE
Height: 65 in
Weight: 2697.6 oz

## 2019-11-20 MED ORDER — METOPROLOL TARTRATE 25 MG PO TABS: 25.0000 mg | ORAL_TABLET | Freq: Two times a day (BID) | ORAL | 0 refills | Status: DC

## 2019-11-20 MED ORDER — ATORVASTATIN CALCIUM 40 MG PO TABS: 40.0000 mg | ORAL_TABLET | Freq: Every day | ORAL | 0 refills | Status: DC

## 2019-11-20 MED ORDER — ASPIRIN 81 MG PO TBEC: 81.0000 mg | DELAYED_RELEASE_TABLET | Freq: Every day | ORAL | 0 refills | Status: AC

## 2019-11-20 MED ORDER — METOPROLOL TARTRATE 25 MG PO TABS: 25.0000 mg | ORAL_TABLET | Freq: Two times a day (BID) | ORAL | Status: DC

## 2019-11-20 NOTE — Progress Notes (Signed)
Patient discharged home as per order, discharge instruction provided, iv removed, patient to follow with cardiologist and PCP within 1 week

## 2019-11-20 NOTE — Plan of Care (Signed)
  Problem: Education: Goal: Knowledge of General Education information will improve Description: Including pain rating scale, medication(s)/side effects and non-pharmacologic comfort measures Outcome: Progressing   Problem: Clinical Measurements: Goal: Cardiovascular complication will be avoided Outcome: Progressing   Problem: Nutrition: Goal: Adequate nutrition will be maintained Outcome: Progressing   Problem: Pain Managment: Goal: General experience of comfort will improve Outcome: Progressing   Problem: Activity: Goal: Ability to tolerate increased activity will improve Outcome: Progressing

## 2019-11-20 NOTE — Progress Notes (Addendum)
Patient Name: Mary Walter    Patient Profile:   Mary Walter is a 64 y.o. female with a strong family history of coronary artery disease, mother with CABG in her 33s, brother with stent/MI in his 45s, history of murmur  dating back over 5 years, hypertension,  presenting with abnormal EKG, 2 episodes of chest tightness with vomiting 4 to 60-month history of intermittent reproducible unpredictable exertional chest discomfort accompanied by shortness of breath relieved by rest. Also a generalized fatigue and malaise over the last 6 months concurrent with the introduction of amlodipine as an antihypertensive Plan was outpt ischemic eval w Cardiac CTA     SUBJECTIVE: Without chest pain.  Past Medical History:  Diagnosis Date  . Hematuria   . Hypercholesterolemia   . Hypertension     Scheduled Meds:  Scheduled Meds: . aspirin EC  81 mg Oral Daily  . atorvastatin  40 mg Oral Daily  . enoxaparin (LOVENOX) injection  40 mg Subcutaneous Q24H  . metoprolol tartrate  12.5 mg Oral BID  . multivitamin with minerals  1 tablet Oral Daily  . sertraline  50 mg Oral Daily   Continuous Infusions: acetaminophen, nitroGLYCERIN, ondansetron (ZOFRAN) IV    PHYSICAL EXAM Vitals:   11/19/19 1930 11/19/19 2022 11/20/19 0403 11/20/19 0730  BP: (!) 159/66 (!) 162/80 124/70 134/77  Pulse: 69 69 64 63  Resp: 13  20 16   Temp:   97.7 F (36.5 C) 98 F (36.7 C)  TempSrc:   Oral   SpO2: 96% 98% 97% 99%  Weight:   76.5 kg   Height:       Well developed and nourished in no acute distress HENT normal Neck supple with JVP-  Fla  Clear Regular rate and rhythm,2/6 m Abd-soft with active BS No Clubbing cyanosis edema Skin-warm and dry A & Oriented  Grossly normal sensory and motor function   TELEMETRY: Reviewed personnally pt in sinus :     Intake/Output Summary (Last 24 hours) at 11/20/2019 1017 Last data filed at 11/20/2019 1017 Gross per 24 hour  Intake 480 ml    Output 800 ml  Net -320 ml    LABS: Basic Metabolic Panel: Recent Labs  Lab 11/17/19 0840 11/19/19 1625  NA 141 138  K 3.9 3.7  CL 108 106  CO2 26 23  GLUCOSE 91 90  BUN 14 15  CREATININE 0.88 0.90  CALCIUM 10.6* 10.3   Cardiac Enzymes: No results for input(s): CKTOTAL, CKMB, CKMBINDEX, TROPONINI in the last 72 hours. CBC: Recent Labs  Lab 11/19/19 1625  WBC 10.0  HGB 14.6  HCT 41.9  MCV 87.5  PLT 208   PROTIME: Recent Labs    11/19/19 1546  LABPROT 13.2  INR 1.0   Liver Function Tests: No results for input(s): AST, ALT, ALKPHOS, BILITOT, PROT, ALBUMIN in the last 72 hours. No results for input(s): LIPASE, AMYLASE in the last 72 hours. BNP: BNP (last 3 results) No results for input(s): BNP in the last 8760 hours.  ProBNP (last 3 results) No results for input(s): PROBNP in the last 8760 hours.  D-Dimer: No results for input(s): DDIMER in the last 72 hours. Hemoglobin A1C: No results for input(s): HGBA1C in the last 72 hours. Fasting Lipid Panel: Recent Labs    11/20/19 0415  CHOL 201*  HDL 35*  LDLCALC 135*  TRIG 157*  CHOLHDL 5.7   Thyroid Function Tests: No results for input(s): TSH, T4TOTAL, T3FREE,  THYROIDAB in the last 72 hours.  Invalid input(s): FREET3 Anemia Panel: No results for input(s): VITAMINB12, FOLATE, FERRITIN, TIBC, IRON, RETICCTPCT in the last 72 hours.      ASSESSMENT AND PLAN:   Abnormal ECG  Chest pain syndrome   HTN  Family history of hypertrophic heart disease question HCM  Hyperlipidemia  The patient has relatively typical exertional chest discomfort accompanied by shortness of breath.  Agree with beta-blockers.  Would increase them from 12.5--25 mg twice daily (metoprolol tartrate) but the data from long-term outcome benefit is not strong with beta-blockers; would be appropriate to consider adjunctive therapy.  This too could be started as an outpatient  Reasonable to discharge the patient with outpatient  evaluation for coronary disease; Dr. Deidre Ala is suggested CTA.  Echocardiogram is pending.  Important for the issue of HCM; echocardiogram 2017 was unenlightening in this regard.  Murmur was identified as mitral regurgitation.  Dyslipidemia is aggressive.  Atorvastatin has been initiated.  We will sign off.  Signed, Virl Axe MD  11/20/2019

## 2019-11-20 NOTE — Discharge Summary (Signed)
[facility] NAME: Mary Walter    MR#:  [id number]  DATE OF BIRTH:  06-Jul-1955  DATE OF ADMISSION:  11/19/2019 ADMITTING PHYSICIAN: Loletha Grayer, MD  DATE OF DISCHARGE: 11/20/2019  PRIMARY CARE PHYSICIAN: Einar Pheasant, MD    ADMISSION DIAGNOSIS:  Unstable angina ([place]) [I20.0] Chest pain [R07.9]  DISCHARGE DIAGNOSIS:  Active Problems:   Essential hypertension   Chest pain   SECONDARY DIAGNOSIS:   Past Medical History:  Diagnosis Date  . Hematuria   . Hypercholesterolemia   . Hypertension     HOSPITAL COURSE:   1.  Chest pain and shortness of breath.  Patient had borderline troponin at 26 and 30.  Patient was seen by cardiology and they recommended outpatient follow-up.  Patient was started on aspirin and beta-blocker and Lipitor.  Echocardiogram done but still pending at the time of this discharge summary. 2.  Essential hypertension.  Norvasc discontinued and patient was started on beta-blocker 3.  Hyperlipidemia unspecified started on Lipitor.  LDL 135 4.  Depression on Zoloft  DISCHARGE CONDITIONS:   Satisfactory  CONSULTS OBTAINED:  Treatment Team:  Minna Merritts, MD  DRUG ALLERGIES:   Allergies  Allergen Reactions  . Macrobid [Nitrofurantoin Macrocrystal]   . Penicillins     Childhood allergy    DISCHARGE MEDICATIONS:   Allergies as of 11/20/2019      Reactions   Macrobid [nitrofurantoin Macrocrystal]    Penicillins    Childhood allergy      Medication List    STOP taking these medications   amLODipine 5 MG tablet Commonly known as: NORVASC   losartan 50 MG tablet Commonly known as: COZAAR   nystatin cream Commonly known as: MYCOSTATIN     TAKE these medications   aspirin 81 MG EC tablet Take 1 tablet (81 mg total) by mouth daily. Start taking on: Nov 21, 2019   atorvastatin 40 MG tablet Commonly known as: LIPITOR Take 1 tablet (40 mg total) by mouth daily. Start  taking on: Nov 21, 2019   metoprolol tartrate 25 MG tablet Commonly known as: LOPRESSOR Take 1 tablet (25 mg total) by mouth 2 (two) times daily.   multivitamin tablet Take 1 tablet by mouth daily.   sertraline 50 MG tablet Commonly known as: ZOLOFT Take 1 tablet (50 mg total) by mouth daily.        DISCHARGE INSTRUCTIONS:   Follow-up Dr. Rockey Situ cardiology this week Follow-up PMD 1 week  If you experience worsening of your admission symptoms, develop shortness of breath, life threatening emergency, suicidal or homicidal thoughts you must seek medical attention immediately by calling 911 or calling your MD immediately  if symptoms less severe.  You Must read complete instructions/literature along with all the possible adverse reactions/side effects for all the Medicines you take and that have been prescribed to you. Take any new Medicines after you have completely understood and accept all the possible adverse reactions/side effects.   Please note  You were cared for by a hospitalist during your hospital stay. If you have any questions about your discharge medications or the care you received while you were in the hospital after you are discharged, you can call the unit and asked to speak with the hospitalist on call if the hospitalist that took care of you is not available. Once you are discharged, your primary care physician will handle any further medical issues. Please note that NO REFILLS for any discharge medications will  be authorized once you are discharged, as it is imperative that you return to your primary care physician (or establish a relationship with a primary care physician if you do not have one) for your aftercare needs so that they can reassess your need for medications and monitor your lab values.    Today   CHIEF COMPLAINT:   Chief Complaint  Patient presents with  . Chest Pain    HISTORY OF PRESENT ILLNESS:  Mary Walter  is a 64 y.o. female came in  with chest pain   VITAL SIGNS:  Blood pressure 128/74, pulse 61, temperature 98.3 F (36.8 C), resp. rate 16, height 5\' 5"  (1.651 m), weight 76.5 kg, last menstrual period 06/24/2010, SpO2 97 %.  I/O:    Intake/Output Summary (Last 24 hours) at 11/20/2019 1229 Last data filed at 11/20/2019 1017 Gross per 24 hour  Intake 480 ml  Output 800 ml  Net -320 ml    PHYSICAL EXAMINATION:  GENERAL:  64 y.o.-year-old patient lying in the bed with no acute distress.  EYES: Pupils equal, round, reactive to light and accommodation. No scleral icterus. Extraocular muscles intact.  HEENT: Head atraumatic, normocephalic. Oropharynx and nasopharynx clear.   LUNGS: Normal breath sounds bilaterally, no wheezing, rales,rhonchi or crepitation. No use of accessory muscles of respiration.  CARDIOVASCULAR: S1, S2 normal. 3/6 systolic murmurs, No rubs, or gallops.  ABDOMEN: Soft, non-tender, non-distended. Bowel sounds present. No organomegaly or mass.  EXTREMITIES: No pedal edema, cyanosis, or clubbing.  NEUROLOGIC: Cranial nerves II through XII are intact. Muscle strength 5/5 in all extremities. Sensation intact. Gait not checked.  PSYCHIATRIC: The patient is alert and oriented x 3.  SKIN: No obvious rash, lesion, or ulcer.   DATA REVIEW:   CBC Recent Labs  Lab 11/19/19 1625  WBC 10.0  HGB 14.6  HCT 41.9  PLT 208    Chemistries  Recent Labs  Lab 11/17/19 0840 11/17/19 0840 11/19/19 1625  NA 141   < > 138  K 3.9   < > 3.7  CL 108   < > 106  CO2 26   < > 23  GLUCOSE 91   < > 90  BUN 14   < > 15  CREATININE 0.88   < > 0.90  CALCIUM 10.6*   < > 10.3  AST 21  --   --   ALT 19  --   --   ALKPHOS 99  --   --   BILITOT 0.8  --   --    < > = values in this interval not displayed.    Cardiac Enzymes 26, 30  Microbiology Results  Results for orders placed or performed during the hospital encounter of 11/19/19  SARS Coronavirus 2 by RT PCR (hospital order, performed in [facility]  hospital lab) Nasopharyngeal Nasopharyngeal Swab     Status: None   Collection Time: 11/19/19  5:28 PM   Specimen: Nasopharyngeal Swab  Result Value Ref Range Status   SARS Coronavirus 2 NEGATIVE NEGATIVE Final    Comment: (NOTE) SARS-CoV-2 target nucleic acids are NOT DETECTED. The SARS-CoV-2 RNA is generally detectable in upper and lower respiratory specimens during the acute phase of infection. The lowest concentration of SARS-CoV-2 viral copies this assay can detect is 250 copies / mL. A negative result does not preclude SARS-CoV-2 infection and should not be used as the sole basis for treatment or other patient management decisions.  A negative result may occur with improper specimen collection /  handling, submission of specimen other than nasopharyngeal swab, presence of viral mutation(s) within the areas targeted by this assay, and inadequate number of viral copies (<250 copies / mL). A negative result must be combined with clinical observations, patient history, and epidemiological information. Fact Sheet for Patients:   StrictlyIdeas.no Fact Sheet for Healthcare Providers: BankingDealers.co.za This test is not yet approved or cleared  by the [place] FDA and has been authorized for detection and/or diagnosis of SARS-CoV-2 by FDA under an Emergency Use Authorization (EUA).  This EUA will remain in effect (meaning this test can be used) for the duration of the COVID-19 declaration under Section 564(b)(1) of the Act, 21 U.S.C. section 360bbb-3(b)(1), unless the authorization is terminated or revoked sooner. Performed at [facility], [address] [place], [place] [address]     RADIOLOGY:  DG Chest 2 View  Result Date: 11/19/2019 CLINICAL DATA:  Chest pain chest pressure and fatigue EXAM: CHEST - 2 VIEW COMPARISON:  None. FINDINGS: The heart size and mediastinal contours are within normal limits. Both lungs are  clear. The visualized skeletal structures are unremarkable. IMPRESSION: No active cardiopulmonary disease. Electronically Signed   By: Donavan Foil M.D.   On: 11/19/2019 17:57    Management plans discussed with the patient, and she is in agreement.  CODE STATUS:     Code Status Orders  (From admission, onward)         Start     Ordered   11/19/19 1752  Full code  Continuous     11/19/19 1751        Code Status History    This patient has a current code status but no historical code status.   Advance Care Planning Activity      TOTAL TIME TAKING CARE OF THIS PATIENT: 35 minutes.    Loletha Grayer M.D on 11/20/2019 at 12:29 PM  Between 7am to 6pm - Pager - [phone]  After 6pm go to [email or web address] - password [email or web address]  [facility]  CC: Primary care physician; Einar Pheasant, MD

## 2019-11-26 ENCOUNTER — Encounter: Payer: Self-pay | Admitting: Nurse Practitioner

## 2019-11-26 ENCOUNTER — Other Ambulatory Visit: Payer: Self-pay

## 2019-11-26 ENCOUNTER — Ambulatory Visit (INDEPENDENT_AMBULATORY_CARE_PROVIDER_SITE_OTHER): Payer: Medicare HMO | Admitting: Nurse Practitioner

## 2019-11-26 VITALS — BP 132/62 | HR 55 | Ht 65.5 in | Wt 169.0 lb

## 2019-11-26 DIAGNOSIS — R072 Precordial pain: Secondary | ICD-10-CM | POA: Diagnosis not present

## 2019-11-26 DIAGNOSIS — E782 Mixed hyperlipidemia: Secondary | ICD-10-CM | POA: Diagnosis not present

## 2019-11-26 DIAGNOSIS — I10 Essential (primary) hypertension: Secondary | ICD-10-CM | POA: Diagnosis not present

## 2019-11-26 DIAGNOSIS — I421 Obstructive hypertrophic cardiomyopathy: Secondary | ICD-10-CM

## 2019-11-26 MED ORDER — METOPROLOL TARTRATE 25 MG PO TABS: 25.0000 mg | ORAL_TABLET | Freq: Two times a day (BID) | ORAL | 6 refills | Status: DC

## 2019-11-26 MED ORDER — ATORVASTATIN CALCIUM 40 MG PO TABS: 40.0000 mg | ORAL_TABLET | Freq: Every day | ORAL | 6 refills | Status: DC

## 2019-11-26 NOTE — Patient Instructions (Signed)
Medication Instructions:  Your physician recommends that you continue on your current medications as directed. Please refer to the Current Medication list given to you today.  *If you need a refill on your cardiac medications before your next appointment, please call your pharmacy*   Lab Work: Your physician recommends that you return for lab work in: 1 week PRIOR TO CT at the medical mall. No appt is needed. Hours are M-F 7AM- 6 PM.  If you have labs (blood work) drawn today and your tests are completely normal, you will receive your results only by: Marland Kitchen MyChart Message (if you have MyChart) OR . A paper copy in the mail If you have any lab test that is abnormal or we need to change your treatment, we will call you to review the results.   Testing/Procedures: 1- Your cardiac CT will be scheduled at one of the below locations:   [facility] [address] [place], [place] [address] (336) Mariano Colon [address] [place], [place] [address] [phone]  If scheduled at [facility], please arrive at the [facility] main entrance of [facility] 30 minutes prior to test start time. Proceed to the [facility] Radiology Department (first floor) to check-in and test prep.  If scheduled at [facility], please arrive 15 mins early for check-in and test prep.  Please follow these instructions carefully (unless otherwise directed):   On the Night Before the Test: . Be sure to Drink plenty of water. . Do not consume any caffeinated/decaffeinated beverages or chocolate 12 hours prior to your test. . Do not take any antihistamines 12 hours prior to your test.  On the Day of the Test: . Drink plenty of water. Do not drink any water within one hour of the test. . Do not eat any food 4 hours prior to the test. . You may take your regular medications prior to the test.  . Take  metoprolol (Lopressor) two hours prior to test. . FEMALES- please wear underwire-free bra if available  After the Test: . Drink plenty of water. . After receiving IV contrast, you may experience a mild flushed feeling. This is normal. . On occasion, you may experience a mild rash up to 24 hours after the test. This is not dangerous. If this occurs, you can take Benadryl 25 mg and increase your fluid intake. . If you experience trouble breathing, this can be serious. If it is severe call 911 IMMEDIATELY. If it is mild, please call our office. . If you take any of these medications: Glipizide/Metformin, Avandament, Glucavance, please do not take 48 hours after completing test unless otherwise instructed.   Once we have confirmed authorization from your insurance company, we will call you to set up a date and time for your test.   For non-scheduling related questions, please contact the cardiac imaging nurse navigator should you have any questions/concerns: Marchia Bond, Cardiac Imaging Nurse Navigator Burley Saver, Interim Cardiac Imaging Nurse [place] [facility] Direct Office Dial: [phone]   For scheduling needs, including cancellations and rescheduling, please call [phone].      Follow-Up: At [facility], you and your health needs are our priority.  As part of our continuing mission to provide you with exceptional heart care, we have created designated Provider Care Teams.  These Care Teams include your primary Cardiologist (physician) and Advanced Practice Providers (APPs -  Physician  Assistants and Nurse Practitioners) who all work together to provide you with the care you need, when you need it.  We recommend signing up for the patient portal called "MyChart".  Sign up information is provided on this After Visit Summary.  MyChart is used to connect with patients for Virtual Visits (Telemedicine).  Patients are able to view lab/test results,  encounter notes, upcoming appointments, etc.  Non-urgent messages can be sent to your provider as well.   To learn more about what you can do with MyChart, go to [email or web address].    Your next appointment:   6-8 week(s)  The format for your next appointment:   In Person  Provider:    You may see Ida Rogue, MD or Murray Hodgkins, NP

## 2019-11-26 NOTE — Progress Notes (Signed)
Office Visit    Patient Name: Mary Walter Date of Encounter: 11/26/2019  Primary Care Provider:  Einar Pheasant, MD Primary Cardiologist:  Ida Rogue, MD  Chief Complaint    64 year old female with a family history of CAD, murmur, hypertension, and hyperlipidemia, who presents for follow-up after recent hospitalization for chest tightness associated with vomiting.  Past Medical History    Past Medical History:  Diagnosis Date  . Chest pain    a. 10/2015 MV: abnl ECG w/ inf/antlat ST depression w/ exercise (in setting of LVH @ baseline). No ischemia/infarct.  . Hematuria   . Hypercholesterolemia   . Hypertension   . Hypertrophic cardiomegaly    a. 10/2019 Echo: EF 70-75%, mod LVH. No rwma. Mild systolic anterior motion of the mitral valve with mild LVOT obstruction (peak gradient 20 mmHg at rest and 26 mmHg with Valsalva).  Normal RV systolic function.  Normal PASP.  Mildly dilated LA.  Marland Kitchen Systolic murmur    Past Surgical History:  Procedure Laterality Date  . BREAST BIOPSY Left 10/27/2019   Affirm bx-distortion-     clip-path pending    Allergies  Allergies  Allergen Reactions  . Macrobid [Nitrofurantoin Macrocrystal]   . Penicillins     Childhood allergy    History of Present Illness    64 year old female with above past medical history including a strong family history of premature CAD with her mother undergoing CABG in her 79s and a brother with stents/MI in his 19s.  Other history includes hypertension, hyperlipidemia, and murmur.  She was previously evaluated in 2017 for murmur and dyspnea and underwent stress testing which was nonischemic.  Echocardiogram showed normal LV function without any significant valvular abnormalities or document outflow tract obstruction.  [name] was recently admitted to [facility] in the setting of episodic chest tightness, dyspnea, and vomiting occurring with activity such as heavy lifting and squatting.  ECG  was notable for LVH with strain pattern.  High-sensitivity troponin was minimally elevated at 30.  She was noted to have a systolic murmur on examination that accentuated with Valsalva.  Echocardiogram was performed and showed hyperdynamic LV function with an EF of 70-75%, moderate LVH, and mild LVOT obstruction with a peak gradient of 20 mmHg at rest and 26 mmHg with Valsalva.  She was placed on beta-blocker therapy with recommendation for outpatient coronary CTA.  Since discharge, she has not had any recurrent chest pain, nausea, or vomiting.  At higher rates of activity, she has noted some dyspnea which resolves quickly with slowing down.  She denies presyncope or syncope.  She thinks she is tolerating metoprolol and atorvastatin just fine.  She is interested in pursuing coronary CTA.  She denies palpitations, PND, orthopnea, edema, or early satiety.  Home Medications    Prior to Admission medications   Medication Sig Start Date End Date Taking? Authorizing Provider  aspirin EC 81 MG EC tablet Take 1 tablet (81 mg total) by mouth daily. 11/21/19   Loletha Grayer, MD  atorvastatin (LIPITOR) 40 MG tablet Take 1 tablet (40 mg total) by mouth daily. 11/21/19   Loletha Grayer, MD  metoprolol tartrate (LOPRESSOR) 25 MG tablet Take 1 tablet (25 mg total) by mouth 2 (two) times daily. 11/20/19   Loletha Grayer, MD  Multiple Vitamin (MULTIVITAMIN) tablet Take 1 tablet by mouth daily.    [provider]  sertraline (ZOLOFT) 50 MG tablet Take 1 tablet (50 mg total) by mouth daily. 04/20/19   Einar Pheasant,  MD    Review of Systems    Some dyspnea on exertion at higher levels of activity.  She denies chest pain, palpitations, PND, orthopnea, dizziness, syncope, edema, or early satiety.  All other systems reviewed and are otherwise negative except as noted above.  Physical Exam    VS:  BP 132/62 (BP Location: Left Arm, Patient Position: Sitting, Cuff Size: Normal)   Pulse (!) 55   Ht 5'  5.5" (1.664 m)   Wt 169 lb (76.7 kg)   LMP 06/24/2010   SpO2 98%   BMI 27.70 kg/m  , BMI Body mass index is 27.7 kg/m. GEN: Well nourished, well developed, in no acute distress. HEENT: normal. Neck: Supple, no JVD, carotid bruits, or masses. Cardiac: RRR, 2/6 midsystolic murmur along the left sternal border, no rubs, or gallops. No clubbing, cyanosis, edema.  Radials/DP/PT 2+ and equal bilaterally.  Respiratory:  Respirations regular and unlabored, clear to auscultation bilaterally. GI: Soft, nontender, nondistended, BS + x 4. MS: no deformity or atrophy. Skin: warm and dry, no rash. Neuro:  Strength and sensation are intact. Psych: Normal affect.  Accessory Clinical Findings    ECG personally reviewed by me today -sinus bradycardia, 55, LVH with repolarization abnormalities- no acute changes.  Lab Results  Component Value Date   WBC 10.0 11/19/2019   HGB 14.6 11/19/2019   HCT 41.9 11/19/2019   MCV 87.5 11/19/2019   PLT 208 11/19/2019   Lab Results  Component Value Date   CREATININE 0.90 11/19/2019   BUN 15 11/19/2019   NA 138 11/19/2019   K 3.7 11/19/2019   CL 106 11/19/2019   CO2 23 11/19/2019   Lab Results  Component Value Date   ALT 19 11/17/2019   AST 21 11/17/2019   ALKPHOS 99 11/17/2019   BILITOT 0.8 11/17/2019   Lab Results  Component Value Date   CHOL 201 (H) 11/20/2019   HDL 35 (L) 11/20/2019   LDLCALC 135 (H) 11/20/2019   LDLDIRECT 144.1 03/30/2013   TRIG 157 (H) 11/20/2019   CHOLHDL 5.7 11/20/2019    Lab Results  Component Value Date   HGBA1C 5.2 03/30/2013    Assessment & Plan    1.  Hypertrophic cardiomyopathy: Patient with a long history of systolic murmur who was recently admitted in the setting of chest pain, nausea, and vomiting occurring with heavy lifting or squatting.  Echo showed hyperdynamic LV function, LVH, and LVOT obstruction with a peak gradient of 20 mmHg at rest and 26 mmHg with Valsalva.  She was placed on beta-blocker  therapy and has not had any recurrent chest pain or nausea/vomiting.  She denies any history of presyncope or syncope.  As recommended during hospitalization, I will arrange for coronary CT angiography in the setting of exertional chest pain and mild high-sensitivity troponin elevation.  Continue beta-blocker.  In the setting of bradycardia, no current role for verapamil.  I encouraged adequate hydration with water and avoidance of straining.  2.  Exertional chest pain: See #1.  We will arrange for coronary CT angiography.  3.  Essential hypertension: Stable on beta-blocker therapy.  4.  Hyperlipidemia: LDL of 135 in May 29.  Atorvastatin therapy initiated.  She will need follow-up fasting lipids and LFTs at her office follow-up in approximately 6 weeks.  5.  Disposition: Follow-up coronary CT angiography.  Follow-up in clinic in 6 weeks or sooner if necessary.   Murray Hodgkins, NP 11/26/2019, 8:34 AM

## 2019-12-14 ENCOUNTER — Telehealth (HOSPITAL_COMMUNITY): Payer: Self-pay | Admitting: *Deleted

## 2019-12-14 NOTE — Telephone Encounter (Signed)

## 2019-12-16 ENCOUNTER — Other Ambulatory Visit: Payer: Self-pay

## 2019-12-16 ENCOUNTER — Ambulatory Visit
Admission: RE | Admit: 2019-12-16 | Discharge: 2019-12-16 | Disposition: A | Discharge status: Home / Self Care | Payer: Medicare HMO | Source: Ambulatory Visit | Attending: Nurse Practitioner | Admitting: Nurse Practitioner

## 2019-12-16 DIAGNOSIS — R072 Precordial pain: Secondary | ICD-10-CM | POA: Diagnosis not present

## 2019-12-16 MED ORDER — IOHEXOL 350 MG/ML SOLN
100.0000 mL | Freq: Once | INTRAVENOUS | Status: AC | PRN
  Administered 2019-12-16: 100 mL via INTRAVENOUS

## 2019-12-16 MED ORDER — NITROGLYCERIN 0.4 MG SL SUBL
0.8000 mg | SUBLINGUAL_TABLET | Freq: Once | SUBLINGUAL | Status: AC
  Administered 2019-12-16: 0.8 mg via SUBLINGUAL

## 2019-12-16 NOTE — Progress Notes (Signed)
Tolerated procedure well. Talking with staff, eating crackers and drinking coffee. No distress noted, no needs, all questions answered. Patient ambulates without difficulty.

## 2019-12-17 ENCOUNTER — Telehealth: Payer: Self-pay

## 2019-12-17 NOTE — Telephone Encounter (Signed)
-----   Message from Theora Gianotti, NP sent at 12/16/2019  5:08 PM EDT ----- Coronary calcium score is elevated @ 182, which is at the [place] percentile for age/sex.  There is mild plaque in the LAD but no significant coronary artery disease.  With elevated calcium score and mild plaque, I recommend continued risk factor modification (diet/exercise) along with statin therapy (recently started). Non-cardiac findings on CT notable for a small - 87mm right lower lobe pulmonary nodule.  As she has never smoked, unless she has had some other environmental exposure that would increase her risk for lung cancer, a follow-up study is not needed.   We can discuss further in July, but overall, a reassuring study.

## 2019-12-17 NOTE — Telephone Encounter (Signed)
Call to patient to review CT cardiac result.    Pt verbalized understanding and has no further questions at this time.    Advised pt to call for any further questions or concerns.  No further orders.

## 2020-01-04 ENCOUNTER — Other Ambulatory Visit: Payer: Self-pay | Admitting: General Surgery

## 2020-01-04 DIAGNOSIS — R928 Other abnormal and inconclusive findings on diagnostic imaging of breast: Secondary | ICD-10-CM

## 2020-01-07 ENCOUNTER — Ambulatory Visit
Admission: RE | Admit: 2020-01-07 | Discharge: 2020-01-07 | Disposition: A | Discharge status: Home / Self Care | Payer: Medicare HMO | Source: Ambulatory Visit | Attending: General Surgery | Admitting: General Surgery

## 2020-01-07 DIAGNOSIS — Z7689 Persons encountering health services in other specified circumstances: Secondary | ICD-10-CM | POA: Diagnosis not present

## 2020-01-07 DIAGNOSIS — R928 Other abnormal and inconclusive findings on diagnostic imaging of breast: Secondary | ICD-10-CM | POA: Insufficient documentation

## 2020-01-07 DIAGNOSIS — R921 Mammographic calcification found on diagnostic imaging of breast: Secondary | ICD-10-CM | POA: Diagnosis not present

## 2020-01-07 HISTORY — PX: BREAST BIOPSY: SHX20

## 2020-01-10 LAB — SURGICAL PATHOLOGY

## 2020-01-11 ENCOUNTER — Ambulatory Visit: Payer: Medicare HMO | Admitting: Nurse Practitioner

## 2020-01-11 ENCOUNTER — Other Ambulatory Visit: Payer: Self-pay | Admitting: General Surgery

## 2020-01-11 DIAGNOSIS — N6099 Unspecified benign mammary dysplasia of unspecified breast: Secondary | ICD-10-CM

## 2020-01-13 DIAGNOSIS — N6092 Unspecified benign mammary dysplasia of left breast: Secondary | ICD-10-CM | POA: Diagnosis not present

## 2020-01-19 ENCOUNTER — Other Ambulatory Visit: Payer: Self-pay

## 2020-01-19 ENCOUNTER — Encounter: Payer: Self-pay | Admitting: Family

## 2020-01-19 ENCOUNTER — Ambulatory Visit: Payer: Medicare HMO | Admitting: Family

## 2020-01-19 VITALS — BP 126/86 | HR 59 | Ht 65.5 in | Wt 171.1 lb

## 2020-01-19 DIAGNOSIS — I421 Obstructive hypertrophic cardiomyopathy: Secondary | ICD-10-CM | POA: Diagnosis not present

## 2020-01-19 DIAGNOSIS — R079 Chest pain, unspecified: Secondary | ICD-10-CM | POA: Diagnosis not present

## 2020-01-19 DIAGNOSIS — E782 Mixed hyperlipidemia: Secondary | ICD-10-CM | POA: Diagnosis not present

## 2020-01-19 DIAGNOSIS — I10 Essential (primary) hypertension: Secondary | ICD-10-CM

## 2020-01-19 DIAGNOSIS — I25118 Atherosclerotic heart disease of native coronary artery with other forms of angina pectoris: Secondary | ICD-10-CM | POA: Diagnosis not present

## 2020-01-19 DIAGNOSIS — E785 Hyperlipidemia, unspecified: Secondary | ICD-10-CM

## 2020-01-19 NOTE — Patient Instructions (Signed)
Medication Instructions:  Your physician recommends that you continue on your current medications as directed. Please refer to the Current Medication list given to you today.  *If you need a refill on your cardiac medications before your next appointment, please call your pharmacy*   Lab Work: Your physician recommends that you have lab work today (Liver panel, and Driect LDL) If you have labs (blood work) drawn today and your tests are completely normal, you will receive your results only by: Marland Kitchen MyChart Message (if you have MyChart) OR . A paper copy in the mail If you have any lab test that is abnormal or we need to change your treatment, we will call you to review the results.   Testing/Procedures: None Ordered   Follow-Up: At [facility], you and your health needs are our priority.  As part of our continuing mission to provide you with exceptional heart care, we have created designated Provider Care Teams.  These Care Teams include your primary Cardiologist (physician) and Advanced Practice Providers (APPs -  Physician Assistants and Nurse Practitioners) who all work together to provide you with the care you need, when you need it.  We recommend signing up for the patient portal called "MyChart".  Sign up information is provided on this After Visit Summary.  MyChart is used to connect with patients for Virtual Visits (Telemedicine).  Patients are able to view lab/test results, encounter notes, upcoming appointments, etc.  Non-urgent messages can be sent to your provider as well.   To learn more about what you can do with MyChart, go to [email or web address].    Your next appointment:   3 month(s)  The format for your next appointment:   In Person  Provider:    You may see Ida Rogue, MD or one of the following Advanced Practice Providers on your designated Care Team:    Murray Hodgkins, NP  Christell Faith, PA-C  Marrianne Mood, PA-C  Laurann Montana, NP    Other  Instructions N/A

## 2020-01-19 NOTE — Progress Notes (Signed)
Office Visit    Patient Name: Mary Walter Date of Encounter: 01/19/2020  Primary Care Provider:  Einar Pheasant, MD Primary Cardiologist:  Ida Rogue, MD Electrophysiologist:  None   Chief Complaint    Mary Walter is a 64 y.o. female with a hx of CAD, murmur, HTN, HLD, hypertrophic cardiomegaly presents today for follow-up after coronary CTA  Past Medical History    Past Medical History:  Diagnosis Date  . Chest pain    a. 10/2015 MV: abnl ECG w/ inf/antlat ST depression w/ exercise (in setting of LVH @ baseline). No ischemia/infarct.  . Hematuria   . Hypercholesterolemia   . Hypertension   . Hypertrophic cardiomegaly    a. 10/2019 Echo: EF 70-75%, mod LVH. No rwma. Mild systolic anterior motion of the mitral valve with mild LVOT obstruction (peak gradient 20 mmHg at rest and 26 mmHg with Valsalva).  Normal RV systolic function.  Normal PASP.  Mildly dilated LA.  Marland Kitchen Systolic murmur    Past Surgical History:  Procedure Laterality Date  . BREAST BIOPSY Left 10/27/2019   Affirm bx-distortion-     clip-path pending  . BREAST BIOPSY Left 01/07/2020   Affim bx 1 area/ x clip and ribbon clip/ path pending    Allergies  Allergies  Allergen Reactions  . Macrobid [Nitrofurantoin Macrocrystal]   . Penicillins     Childhood allergy    History of Present Illness    Mary Walter is a 64 y.o. female with a hx of CAD, murmur, HTN, HLD, hypertrophic cardiomegaly last seen 11/26/2019 by Ignacia Bayley, NP.  Previously evaluated in 2017 for murmur and dyspnea and underwent stress testing which was nonischemic.  Echo with normal LV function without significant valvular abnormalities or documented outflow tract obstruction.  She was admitted to [facility] 10/2019 in the setting of episode of chest tightness, dyspnea, vomiting with activity such as heavy lifting and squatting.  EKG notable for LVH with strain pattern  HS troponin was minimally elevated at 30.  Systolic  murmur on examination accentuated with Valsalva.  Echo with hyperdynamic LVEF 70 to 75%, moderate LVH, mild LVOT obstruction with a peak gradient of 20 mmHg at rest and 26 mmHg with Valsalva.  She was placed on beta-blocker therapy and recommended for coronary CTA.  Subsequent coronary CTA 12/16/2019 with coronary calcium score of 182 placing her in the 88th percentile for age/sex, mild (25-49%) calcified plaque in proximal LAD, recommended for risk factor modification.  Also noted 4 mm right lower lobe pulmonary nodule however she is low risk with no smoking history no follow-up imaging needed.  We reviewed her coronary CTA results.  Tells me she checks her heart rate and BP at home. Her HR will be as high as 90s.  She can tell when her blood pressure is elevated but overall well controlled.  Reports no chest pain, pressure, tightness.  Reports no recurrent nausea nor vomiting.  Reports no shortness of breath at rest and tells me her dyspnea on exertion is stable at baseline and improved since addition of metoprolol.  She has been undergoing work-up for atypical ductal hyperplasia of the breast with plans for possible surgical intervention.  EKGs/Labs/Other Studies Reviewed:   The following studies were reviewed today: Echo 11/20/19  1. Left ventricular ejection fraction, by estimation, is 70 to 75%. The  left ventricle has hyperdynamic function. The left ventricle has no  regional wall motion abnormalities. There is moderate left ventricular  hypertrophy. Left ventricular  diastolic  parameters are indeterminate. Mild systolic anterior motion of the mitral  valve with mild LVOT obstruction. Peak gradient of 20 mm Hg at rest and 26  mm Hg with Valsalva.   2. Right ventricular systolic function is normal. The right ventricular  size is normal. There is normal pulmonary artery systolic pressure.   3. Left atrial size was mildly dilated.   4. The mitral valve is normal in structure. No evidence of  mitral valve  regurgitation. No evidence of mitral stenosis.   5. The aortic valve is normal in structure. Aortic valve regurgitation is  not visualized. No aortic stenosis is present.   6. The inferior vena cava is normal in size with greater than 50%  respiratory variability, suggesting right atrial pressure of 3 mmHg.   EKG:  EKG is ordered today.  The ekg ordered today demonstrates sinus bradycardia 59 bpm with LVH QR S widening and repolarization abnormality.  Recent Labs: 04/20/2019: TSH 1.63 11/17/2019: ALT 19 11/19/2019: BUN 15; Creatinine, Ser 0.90; Hemoglobin 14.6; Platelets 208; Potassium 3.7; Sodium 138  Recent Lipid Panel    Component Value Date/Time   CHOL 201 (H) 11/20/2019 0415   TRIG 157 (H) 11/20/2019 0415   HDL 35 (L) 11/20/2019 0415   CHOLHDL 5.7 11/20/2019 0415   VLDL 31 11/20/2019 0415   LDLCALC 135 (H) 11/20/2019 0415   LDLDIRECT 144.1 03/30/2013 0829    Home Medications   Current Meds  Medication Sig  . aspirin EC 81 MG EC tablet Take 1 tablet (81 mg total) by mouth daily.  Marland Kitchen atorvastatin (LIPITOR) 40 MG tablet Take 1 tablet (40 mg total) by mouth daily.  . metoprolol tartrate (LOPRESSOR) 25 MG tablet Take 1 tablet (25 mg total) by mouth 2 (two) times daily.  . Multiple Vitamin (MULTIVITAMIN) tablet Take 1 tablet by mouth daily.  . sertraline (ZOLOFT) 50 MG tablet Take 1 tablet (50 mg total) by mouth daily.    Review of Systems   Review of Systems  Constitutional: Negative for chills, fever and malaise/fatigue.  Cardiovascular: Positive for dyspnea on exertion. Negative for chest pain, leg swelling, near-syncope, orthopnea, palpitations and syncope.  Respiratory: Negative for cough, shortness of breath and wheezing.   Gastrointestinal: Negative for nausea and vomiting.  Neurological: Negative for dizziness, light-headedness and weakness.   All other systems reviewed and are otherwise negative except as noted above.  Physical Exam    VS:  BP (!)  126/86 (BP Location: Left Arm, Patient Position: Sitting, Cuff Size: Normal)   Pulse 59   Ht 5' 5.5" (1.664 m)   Wt 171 lb 2 oz (77.6 kg)   LMP 06/24/2010   SpO2 99%   BMI 28.04 kg/m  , BMI Body mass index is 28.04 kg/m. GEN: Well nourished, well developed, in no acute distress. HEENT: normal. Neck: Supple, no JVD, carotid bruits, or masses. Cardiac: RRR, no  rubs, or gallops. 2/6 systolic murmur. No clubbing, cyanosis, edema.  Radials/DP/PT 2+ and equal bilaterally.  Respiratory:  Respirations regular and unlabored, clear to auscultation bilaterally. GI: Soft, nontender, nondistended. MS: No deformity or atrophy. Skin: Warm and dry, no rash. Neuro:  Strength and sensation are intact. Psych: Normal affect.  Assessment & Plan    1. Hypertrophic cardiomyopathy -Echo 10/2019 with hyperdynamic LV function, LVH, LVOT obstruction with peak gradient of 20 mm great rest and 26 mmHg with Valsalva.  Continue beta-blocker.  No verapamil due to bradycardia.  Recommend adequate hydration and avoidance of straining.  2.  HTN - BP well controlled. Continue current antihypertensive regimen.   3. HLD - Continue Atorvastatin 40mg  daily. Repeat direct LDL and liver panel today. If LDL not at goal of <70 consider increased dose of atorvastatin to 80 mg daily worse addition of Zetia.  4. Coronary artery disease-coronary CTA 12/16/2019 with coronary calcium score of 22 placing her knee the percentile for age/sex with mild (25 to 79%) calcified plaque in proximal LAD recommended for risk factor modification.  GDMT presently includes aspirin, beta-blocker, statin.  No indication for further ischemic evaluation this time.  5. Preoperative cardiovascular evaluation -she has potential for upcoming surgery with Dr. Bary Castilla for ADH. Tells me she tentatively plans to have this done late August. She has an exercise tolerance of >4 METS. Per AHA/ACC guidelines she is deemed acceptable risk for the planned procedure and  and may proceed without further cardiovascular testing pending the procedure is done within the next 3-6 months.  Disposition: Follow up in 3 month(s) with Dr. Rockey Situ or APP   Loel Dubonnet, NP 01/19/2020, 10:11 AM

## 2020-01-20 LAB — LDL CHOLESTEROL, DIRECT: LDL Direct: 57 mg/dL (ref 0–99)

## 2020-01-20 LAB — ALT: ALT: 27 IU/L (ref 0–32)

## 2020-01-20 LAB — AST: AST: 25 IU/L (ref 0–40)

## 2020-01-25 ENCOUNTER — Other Ambulatory Visit: Payer: Self-pay | Admitting: General Surgery

## 2020-01-25 DIAGNOSIS — N6092 Unspecified benign mammary dysplasia of left breast: Secondary | ICD-10-CM

## 2020-01-26 ENCOUNTER — Other Ambulatory Visit: Payer: Self-pay | Admitting: General Surgery

## 2020-01-26 DIAGNOSIS — N6092 Unspecified benign mammary dysplasia of left breast: Secondary | ICD-10-CM

## 2020-02-09 ENCOUNTER — Encounter
Admission: RE | Admit: 2020-02-09 | Discharge: 2020-02-09 | Disposition: A | Discharge status: Home / Self Care | Payer: Medicare HMO | Source: Ambulatory Visit | Attending: General Surgery | Admitting: General Surgery

## 2020-02-09 ENCOUNTER — Inpatient Hospital Stay: Admission: RE | Admit: 2020-02-09 | Payer: Medicare HMO | Source: Ambulatory Visit

## 2020-02-09 ENCOUNTER — Other Ambulatory Visit: Payer: Self-pay

## 2020-02-09 HISTORY — DX: Family history of other specified conditions: Z84.89

## 2020-02-09 HISTORY — DX: Dyspnea, unspecified: R06.00

## 2020-02-09 NOTE — Pre-Procedure Instructions (Signed)
Loel Dubonnet, NP  Nurse Practitioner  Cardiology  Progress Notes    Signed  Encounter Date:  01/19/2020          Signed      Expand All Collapse All  Show:Clear all [x] Manual[x] Template[x] Copied  Added by: [x] Loel Dubonnet, NP  [] Hover for details    Office Visit    Walter Name: Mary Walter Date of Encounter: 01/19/2020  Primary Care Provider:  Einar Pheasant, MD Primary Cardiologist:  Ida Rogue, MD Electrophysiologist:  None   Chief Complaint    Mary Walter is a 64 y.o. female with a hx of CAD, murmur, HTN, HLD, hypertrophic cardiomegaly presents today for follow-up after coronary CTA  Past Medical History        Past Medical History:  Diagnosis Date  . Chest pain    a. 10/2015 MV: abnl ECG w/ inf/antlat ST depression w/ exercise (in setting of LVH @ baseline). No ischemia/infarct.  . Hematuria   . Hypercholesterolemia   . Hypertension   . Hypertrophic cardiomegaly    a. 10/2019 Echo: EF 70-75%, mod LVH. No rwma. Mild systolic anterior motion of the mitral valve with mild LVOT obstruction (peak gradient 20 mmHg at rest and 26 mmHg with Valsalva).  Normal RV systolic function.  Normal PASP.  Mildly dilated LA.  Marland Kitchen Systolic murmur         Past Surgical History:  Procedure Laterality Date  . BREAST BIOPSY Left 10/27/2019   Affirm bx-distortion-     clip-path pending  . BREAST BIOPSY Left 01/07/2020   Affim bx 1 area/ x clip and ribbon clip/ path pending    Allergies       Allergies  Allergen Reactions  . Macrobid [Nitrofurantoin Macrocrystal]   . Penicillins     Childhood allergy    History of Present Illness    Mary Walter is a 64 y.o. female with a hx of CAD, murmur, HTN, HLD, hypertrophic cardiomegaly last seen 11/26/2019 by Ignacia Bayley, NP.  Previously evaluated in 2017 for murmur and dyspnea and underwent stress testing which was nonischemic.  Echo with normal LV function without  significant valvular abnormalities or documented outflow tract obstruction.  She was admitted to [facility] 10/2019 in the setting of episode of chest tightness, dyspnea, vomiting with activity such as heavy lifting and squatting.  EKG notable for LVH with strain pattern  HS troponin was minimally elevated at 30.  Systolic murmur on examination accentuated with Valsalva.  Echo with hyperdynamic LVEF 70 to 75%, moderate LVH, mild LVOT obstruction with a peak gradient of 20 mmHg at rest and 26 mmHg with Valsalva.  She was placed on beta-blocker therapy and recommended for coronary CTA.  Subsequent coronary CTA 12/16/2019 with coronary calcium score of 182 placing her in the 88th percentile for age/sex, mild (25-49%) calcified plaque in proximal LAD, recommended for risk factor modification.  Also noted 4 mm right lower lobe pulmonary nodule however she is low risk with no smoking history no follow-up imaging needed.  We reviewed her coronary CTA results.  Tells me she checks her heart rate and BP at home. Her HR will be as high as 90s.  She can tell when her blood pressure is elevated but overall well controlled.  Reports no chest pain, pressure, tightness.  Reports no recurrent nausea nor vomiting.  Reports no shortness of breath at rest and tells me her dyspnea on exertion is stable at baseline and improved since addition of metoprolol.  She has been undergoing work-up for atypical ductal hyperplasia of the breast with plans for possible surgical intervention.  EKGs/Labs/Other Studies Reviewed:   The following studies were reviewed today: Echo 11/20/19 1. Left ventricular ejection fraction, by estimation, is 70 to 75%. The  left ventricle has hyperdynamic function. The left ventricle has no  regional wall motion abnormalities. There is moderate left ventricular  hypertrophy. Left ventricular diastolic  parameters are indeterminate. Mild systolic anterior motion of the mitral  valve with mild LVOT  obstruction. Peak gradient of 20 mm Hg at rest and 26  mm Hg with Valsalva.  2. Right ventricular systolic function is normal. The right ventricular  size is normal. There is normal pulmonary artery systolic pressure.  3. Left atrial size was mildly dilated.  4. The mitral valve is normal in structure. No evidence of mitral valve  regurgitation. No evidence of mitral stenosis.  5. The aortic valve is normal in structure. Aortic valve regurgitation is  not visualized. No aortic stenosis is present.  6. The inferior vena cava is normal in size with greater than 50%  respiratory variability, suggesting right atrial pressure of 3 mmHg.   EKG:  EKG is ordered today.  The ekg ordered today demonstrates sinus bradycardia 59 bpm with LVH QR S widening and repolarization abnormality.  Recent Labs: 04/20/2019: TSH 1.63 11/17/2019: ALT 19 11/19/2019: BUN 15; Creatinine, Ser 0.90; Hemoglobin 14.6; Platelets 208; Potassium 3.7; Sodium 138  Recent Lipid Panel Labs (Brief)          Component Value Date/Time   CHOL 201 (H) 11/20/2019 0415   TRIG 157 (H) 11/20/2019 0415   HDL 35 (L) 11/20/2019 0415   CHOLHDL 5.7 11/20/2019 0415   VLDL 31 11/20/2019 0415   LDLCALC 135 (H) 11/20/2019 0415   LDLDIRECT 144.1 03/30/2013 0829      Home Medications   Active Medications      Current Meds  Medication Sig  . aspirin EC 81 MG EC tablet Take 1 tablet (81 mg total) by mouth daily.  Marland Kitchen atorvastatin (LIPITOR) 40 MG tablet Take 1 tablet (40 mg total) by mouth daily.  . metoprolol tartrate (LOPRESSOR) 25 MG tablet Take 1 tablet (25 mg total) by mouth 2 (two) times daily.  . Multiple Vitamin (MULTIVITAMIN) tablet Take 1 tablet by mouth daily.  . sertraline (ZOLOFT) 50 MG tablet Take 1 tablet (50 mg total) by mouth daily.      Review of Systems   Review of Systems  Constitutional: Negative for chills, fever and malaise/fatigue.  Cardiovascular: Positive for dyspnea on exertion.  Negative for chest pain, leg swelling, near-syncope, orthopnea, palpitations and syncope.  Respiratory: Negative for cough, shortness of breath and wheezing.   Gastrointestinal: Negative for nausea and vomiting.  Neurological: Negative for dizziness, light-headedness and weakness.   All other systems reviewed and are otherwise negative except as noted above.  Physical Exam    VS:  BP (!) 126/86 (BP Location: Left Arm, Walter Position: Sitting, Cuff Size: Normal)   Pulse 59   Ht 5' 5.5" (1.664 m)   Wt 171 lb 2 oz (77.6 kg)   LMP 06/24/2010   SpO2 99%   BMI 28.04 kg/m  , BMI Body mass index is 28.04 kg/m. GEN: Well nourished, well developed, in no acute distress. HEENT: normal. Neck: Supple, no JVD, carotid bruits, or masses. Cardiac: RRR, no  rubs, or gallops. 2/6 systolic murmur. No clubbing, cyanosis, edema.  Radials/DP/PT 2+ and equal bilaterally.  Respiratory:  Respirations  regular and unlabored, clear to auscultation bilaterally. GI: Soft, nontender, nondistended. MS: No deformity or atrophy. Skin: Warm and dry, no rash. Neuro:  Strength and sensation are intact. Psych: Normal affect.  Assessment & Plan    1. Hypertrophic cardiomyopathy -Echo 10/2019 with hyperdynamic LV function, LVH, LVOT obstruction with peak gradient of 20 mm great rest and 26 mmHg with Valsalva.  Continue beta-blocker.  No verapamil due to bradycardia.  Recommend adequate hydration and avoidance of straining.  2. HTN - BP well controlled. Continue current antihypertensive regimen.   3. HLD - Continue Atorvastatin 40mg  daily. Repeat direct LDL and liver panel today. If LDL not at goal of <70 consider increased dose of atorvastatin to 80 mg daily worse addition of Zetia.  4. Coronary artery disease-coronary CTA 12/16/2019 with coronary calcium score of 22 placing her knee the percentile for age/sex with mild (25 to 79%) calcified plaque in proximal LAD recommended for risk factor modification.  GDMT  presently includes aspirin, beta-blocker, statin.  No indication for further ischemic evaluation this time.  5. Preoperative cardiovascular evaluation -she has potential for upcoming surgery with Dr. Bary Castilla for ADH. Tells me she tentatively plans to have this done late August. She has an exercise tolerance of >4 METS. Per AHA/ACC guidelines she is deemed acceptable risk for the planned procedure and and may proceed without further cardiovascular testing pending the procedure is done within the next 3-6 months.  Disposition: Follow up in 3 month(s) with Dr. Rockey Situ or APP   Loel Dubonnet, NP 01/19/2020, 10:11 AM        Electronically signed by Loel Dubonnet, NP at 01/19/2020 10:18 AM  Office Visit on 01/19/2020   Office Visit on 01/19/2020     Detailed Report    Note viewed by Walter

## 2020-02-09 NOTE — Patient Instructions (Addendum)
Your procedure is scheduled on: 02-14-20 MONDAY Report to [place] @ 7:45 AM  Remember: Instructions that are not followed completely may result in serious medical risk, up to and including death, or upon the discretion of your surgeon and anesthesiologist your surgery may need to be rescheduled.    _x___ 1. Do not eat food after midnight the night before your procedure. NO GUM OR CANDY AFTER MIDNIGHT. You may drink clear liquids up to 2 hours before you are scheduled to arrive at the hospital for your procedure.  Do not drink clear liquids within 2 hours of your scheduled arrival to the hospital.  Clear liquids include  --Water or Apple juice without pulp  --Gatorade  --Black Coffee or Clear Tea (No milk, no creamers, do not add anything to the coffee or Tea .     __x__ 2. No Alcohol for 24 hours before or after surgery.   __x__3. No Smoking or e-cigarettes for 24 prior to surgery.  Do not use any chewable tobacco products for at least 6 hour prior to surgery   ____  4. Bring all medications with you on the day of surgery if instructed.    __x__ 5. Notify your doctor if there is any change in your medical condition     (cold, fever, infections).    x___6. On the morning of surgery brush your teeth with toothpaste and water.  You may rinse your mouth with mouth wash if you wish.  Do not swallow any toothpaste or mouthwash.   Do not wear jewelry, make-up, hairpins, clips or nail polish.  Do not wear lotions, powders, or perfumes.  Do not shave 48 hours prior to surgery. Men may shave face and neck.  Do not bring valuables to the hospital.    [facility] is not responsible for any belongings or valuables.               Contacts, dentures or bridgework may not be worn into surgery.  Leave your suitcase in the car. After surgery it may be brought to your room.  For patients admitted to the hospital, discharge time is determined by your treatment team.  _  Patients  discharged the day of surgery will not be allowed to drive home.  You will need someone to drive you home and stay with you the night of your procedure.    Please read over the following fact sheets that you were given:   [facility] Preparing for Surgery   _x___ TAKE THE FOLLOWING MEDICATION THE MORNING OF SURGERY WITH A SMALL SIP OF WATER. These include:  1. METOPROLOL (TOPROL)  2. LIPITOR (ATORVASTATIN)  3. ZOLOFT (SERTRALINE)  4.  5.  6.  ____Fleets enema or Magnesium Citrate as directed.   _x___ Use CHG Soap as directed on instruction sheet   ____ Use inhalers on the day of surgery and bring to hospital day of surgery  ____ Stop Metformin and Janumet 2 days prior to surgery.    ____ Take 1/2 of usual insulin dose the night before surgery and none on the morning surgery.   _x___ Follow recommendations from Cardiologist, Pulmonologist or PCP regarding stopping Aspirin, Coumadin, Plavix ,Eliquis, Effient, or Pradaxa, and Pletal-CALL DR BYRNETT'S OFFICE REGARDING YOUR 81 MG ASPIRIN  X____Stop Anti-inflammatories such as Advil, Aleve, Ibuprofen, Motrin, Naproxen, Naprosyn, Goodies powders or aspirin products NOW-OK to take Tylenol    ____ Stop supplements until after surgery.     ____ Bring C-Pap to the  hospital.

## 2020-02-11 ENCOUNTER — Other Ambulatory Visit: Payer: Self-pay

## 2020-02-11 ENCOUNTER — Other Ambulatory Visit
Admission: RE | Admit: 2020-02-11 | Discharge: 2020-02-11 | Disposition: A | Discharge status: Home / Self Care | Payer: Medicare HMO | Source: Ambulatory Visit | Attending: General Surgery | Admitting: General Surgery

## 2020-02-11 DIAGNOSIS — Z20822 Contact with and (suspected) exposure to covid-19: Secondary | ICD-10-CM | POA: Diagnosis not present

## 2020-02-11 DIAGNOSIS — Z01812 Encounter for preprocedural laboratory examination: Secondary | ICD-10-CM | POA: Diagnosis not present

## 2020-02-11 LAB — SARS CORONAVIRUS 2 (TAT 6-24 HRS): SARS Coronavirus 2: NEGATIVE

## 2020-02-11 NOTE — Progress Notes (Signed)
[facility] Perioperative Services  Pre-Admission/Anesthesia Testing Clinical Review  Date: 02/11/20  Patient Demographics:  Name: Mary Walter DOB:   [birth year] MRN:   [id number]  Planned Surgical Procedure(s):    Case: [id number] Date/Time: 02/14/20 1155   Procedure: BREAST LUMPECTOMY WITH NEEDLE LOCALIZATION (Left )   Anesthesia type: General   Pre-op diagnosis: ADH left breast x 2   Location: [facility] OR ROOM 07 / [place] ORS FOR ANESTHESIA GROUP   Surgeons: Robert Bellow, MD     NOTE: Available PAT nursing documentation and vital signs have been reviewed. Clinical nursing staff has updated patient's PMH/PSHx, current medication list, and drug allergies/intolerances to ensure comprehensive history available to assist in medical decision making as it pertains to the aforementioned surgical procedure and anticipated anesthetic course.   Clinical Discussion:  Mary Walter is a 64 y.o. female who is submitted for pre-surgical anesthesia review and clearance prior to her undergoing the above procedure. Patient has never been a smoker. Pertinent PMH includes: Hypertrophic cardiomegaly, angina, systolic murmur, MV regurgitation, RBBB, HTN, HLD, exertional dyspnea, depression  Patient is followed by cardiology Rockey Situ, MD). She was last seen in the cardiology clinic on 01/19/2020; notes reviewed.  Patient stable overall.  She denied any chest pain.  No shortness of breath at rest, however exertional dyspnea remains present and is at baseline.  TTE in 11/11/2019 revealed a hyperdynamic LVEF of 75-75%.  Subsequent coronary CTA revealed a coronary calcium score of 182 (88th percentile for age and sex).  There is mild (25-49%) calcified plaque in the proximal LAD (see full results below).  EKG in clinic notable for sinus bradycardia with LVH and repolarization abnormality.  HTN well controlled on current beta-blocker therapy.  Patient on statin for HLD.  Patient denied  any orthopnea, PND, vertiginous symptoms, significant peripheral edema, or presyncope/syncope.  Functional capacity documented as being > 4 METS.  Patient with plans for upcoming surgery and late August.  Per cardiology, "based on AHA/ACC guidelines she is deemed at an acceptable risk for the planned procedure and may proceed without further cardiovascular testing pending the procedure". This patient is on daily antiplatelet therapy. She has been instructed on recommendations for holding her daily low-dose ASA her attending surgeon.   She denies previous intra-operative complications with anesthesia.   Vitals with BMI 01/19/2020 12/16/2019 12/16/2019  Height 5' 5.5" - -  Weight 171 lbs 2 oz - -  BMI 16.38 - -  Systolic 453 646 803  Diastolic 86 58 72  Pulse 59 63 56    Providers/Specialists:   NOTE: Primary physician provider listed below. Patient may have been seen by APP or partner within same practice.   PROVIDER ROLE LAST Desma Mcgregor, MD General Surgery 01/13/2020  Einar Pheasant, MD Primary Care Provider 11/19/2019  Ida Rogue, MD Cardiology 01/19/2020   Allergies:  Macrobid [nitrofurantoin macrocrystal] and Penicillins  Current Home Medications:   No current facility-administered medications for this encounter.   Marland Kitchen aspirin EC 81 MG EC tablet  . atorvastatin (LIPITOR) 40 MG tablet  . metoprolol tartrate (LOPRESSOR) 25 MG tablet  . Multiple Vitamin (MULTIVITAMIN) tablet  . sertraline (ZOLOFT) 50 MG tablet   History:   Past Medical History:  Diagnosis Date  . Chest pain    a. 10/2015 MV: abnl ECG w/ inf/antlat ST depression w/ exercise (in setting of LVH @ baseline). No ischemia/infarct.  Marland Kitchen Dyspnea    WITH EXERTION DUE TO ENLARGED HEART  . Family  history of adverse reaction to anesthesia    SON-LUNGS FILLED UP WITH FLUID AFTER APPENDECTOMY  . Hematuria   . Hypercholesterolemia   . Hypertension   . Hypertrophic cardiomegaly    a. 10/2019 Echo: EF 70-75%,  mod LVH. No rwma. Mild systolic anterior motion of the mitral valve with mild LVOT obstruction (peak gradient 20 mmHg at rest and 26 mmHg with Valsalva).  Normal RV systolic function.  Normal PASP.  Mildly dilated LA.  Marland Kitchen Systolic murmur    Past Surgical History:  Procedure Laterality Date  . BREAST BIOPSY Left 10/27/2019   Affirm bx-distortion-     clip-path pending  . BREAST BIOPSY Left 01/07/2020   Affim bx 1 area/ x clip and ribbon clip/ path pending  . FACIAL FRACTURE SURGERY     X 2   Family History  Problem Relation Age of Onset  . Diabetes Mother   . Heart disease Mother   . Hypertension Mother   . Cancer Father        prostate   . Diverticulitis Father   . CVA Father   . Heart disease Father   . Stroke Father   . Hypertension Father   . Heart murmur Father   . Heart attack Brother   . CVA Paternal Grandmother   . Breast cancer Cousin    Social History   Tobacco Use  . Smoking status: Never Smoker  . Smokeless tobacco: Never Used  Vaping Use  . Vaping Use: Never used  Substance Use Topics  . Alcohol use: No    Alcohol/week: 0.0 standard drinks  . Drug use: No    Pertinent Clinical Results:  LABS: Labs reviewed: Acceptable for surgery.     ECG: Date: 01/19/2020 Time ECG obtained: 0839 AM Rate: 59 bpm Rhythm: sinus bradycardiaLVH with QRS widening and repolarization abnormality Axis (leads I and aVF): Right axis deviation Intervals: PR 166 ms. QTc 429 ms. ST segment and T wave changes: No evidence of acute ST segment elevation or depression Comparison: Similar to previous tracing obtained on 11/26/2019   IMAGING / PROCEDURES: CORONARY CTA done on 12/16/2019 1. Coronary calcium score of 182. This was 88th percentile for age and sex matched control. 2. Normal coronary origin with right dominance. 3. There is mild (25-49%) calcified plaque in the proximal LAD. 4. No acute findings in the imaged extracardiac chest. 5. Trace bilateral pleural  fluid. 6. 4 mm right lower lobe pulmonary nodule.   ECHOCARDIOGRAM done on 11/20/2019 1. Left ventricular ejection fraction, by estimation, is 70 to 75%.  2. The left ventricle has hyperdynamic function.  3. The left ventricle has no regional wall motion abnormalities.  4. There is moderate left ventricular hypertrophy.  5. Left ventricular diastolic parameters are indeterminate.  6. Mild systolic anterior motion of the mitral valve with mild LVOT obstruction. Peak gradient of 20 mm Hg at rest and 26 mm Hg with Valsalva.  7. Right ventricular systolic function is normal.  8. The right ventricular size is normal.  9. There is normal pulmonary artery systolic pressure.  10. Left atrial size was mildly dilated.  11. The mitral valve is normal in structure. No evidence of mitral valve regurgitation. No evidence of mitral stenosis.  12. The aortic valve is normal in structure. Aortic valve regurgitation is not visualized. No aortic stenosis is present.  13. The inferior vena cava is normal in size with greater than 50% respiratory variability, suggesting right atrial pressure of 3 mmHg.  Impression and Plan:  Mary Walter has been referred for pre-anesthesia review and clearance prior her undergoing the planned anesthetic and procedural courses. Available labs, pertinent testing, and imaging results were personally reviewed by me. This patient has been appropriately cleared by cardiology.   Based on clinical review performed today (02/11/20), barring any significant acute changes in the patient's overall condition, it is anticipated that she will be able to proceed with the planned surgical intervention. Any acute changes in clinical condition may necessitate her procedure being postponed and/or cancelled. Pre-surgical instructions were reviewed with the patient during her PAT appointment and questions were fielded by PAT clinical staff.  Honor Loh, MSN, APRN, FNP-C, CEN [facility]  Peri-operative Services Nurse Practitioner Phone: [phone] 02/11/20 9:56 AM  NOTE: This note has been prepared using Dragon dictation software. Despite my best ability to proofread, there is always the potential that unintentional transcriptional errors may still occur from this process.

## 2020-02-14 ENCOUNTER — Ambulatory Visit: Payer: Medicare HMO | Admitting: Urgent Care

## 2020-02-14 ENCOUNTER — Encounter
Admission: RE | Disposition: A | Discharge status: Home / Self Care | Payer: Self-pay | Source: Home / Self Care | Attending: General Surgery

## 2020-02-14 ENCOUNTER — Ambulatory Visit
Admission: RE | Admit: 2020-02-14 | Discharge: 2020-02-14 | Disposition: A | Discharge status: Home / Self Care | Payer: Medicare HMO | Attending: General Surgery | Admitting: General Surgery

## 2020-02-14 ENCOUNTER — Ambulatory Visit
Admission: RE | Admit: 2020-02-14 | Discharge: 2020-02-14 | Disposition: A | Discharge status: Home / Self Care | Payer: Medicare HMO | Source: Ambulatory Visit | Attending: General Surgery | Admitting: General Surgery

## 2020-02-14 ENCOUNTER — Other Ambulatory Visit: Payer: Self-pay

## 2020-02-14 ENCOUNTER — Encounter: Payer: Self-pay | Admitting: General Surgery

## 2020-02-14 ENCOUNTER — Ambulatory Visit
Admission: RE | Admit: 2020-02-14 | Discharge: 2020-02-14 | Disposition: A | Discharge status: Home / Self Care | Payer: Medicare HMO | Source: Home / Self Care | Attending: General Surgery | Admitting: General Surgery

## 2020-02-14 DIAGNOSIS — F329 Major depressive disorder, single episode, unspecified: Secondary | ICD-10-CM | POA: Insufficient documentation

## 2020-02-14 DIAGNOSIS — I10 Essential (primary) hypertension: Secondary | ICD-10-CM | POA: Diagnosis not present

## 2020-02-14 DIAGNOSIS — D0512 Intraductal carcinoma in situ of left breast: Secondary | ICD-10-CM | POA: Diagnosis not present

## 2020-02-14 DIAGNOSIS — E7800 Pure hypercholesterolemia, unspecified: Secondary | ICD-10-CM | POA: Diagnosis not present

## 2020-02-14 DIAGNOSIS — Z7982 Long term (current) use of aspirin: Secondary | ICD-10-CM | POA: Diagnosis not present

## 2020-02-14 DIAGNOSIS — N6321 Unspecified lump in the left breast, upper outer quadrant: Secondary | ICD-10-CM | POA: Diagnosis present

## 2020-02-14 DIAGNOSIS — N6092 Unspecified benign mammary dysplasia of left breast: Secondary | ICD-10-CM

## 2020-02-14 DIAGNOSIS — R928 Other abnormal and inconclusive findings on diagnostic imaging of breast: Secondary | ICD-10-CM | POA: Diagnosis not present

## 2020-02-14 DIAGNOSIS — Z880 Allergy status to penicillin: Secondary | ICD-10-CM | POA: Insufficient documentation

## 2020-02-14 DIAGNOSIS — Z79899 Other long term (current) drug therapy: Secondary | ICD-10-CM | POA: Insufficient documentation

## 2020-02-14 DIAGNOSIS — Z7689 Persons encountering health services in other specified circumstances: Secondary | ICD-10-CM | POA: Diagnosis not present

## 2020-02-14 DIAGNOSIS — R69 Illness, unspecified: Secondary | ICD-10-CM | POA: Diagnosis not present

## 2020-02-14 DIAGNOSIS — N6082 Other benign mammary dysplasias of left breast: Secondary | ICD-10-CM | POA: Diagnosis not present

## 2020-02-14 DIAGNOSIS — D242 Benign neoplasm of left breast: Secondary | ICD-10-CM | POA: Diagnosis not present

## 2020-02-14 HISTORY — PX: BREAST LUMPECTOMY WITH NEEDLE LOCALIZATION: SHX5759

## 2020-02-14 SURGERY — BREAST LUMPECTOMY WITH NEEDLE LOCALIZATION
Anesthesia: General | Laterality: Left

## 2020-02-14 MED ORDER — FENTANYL CITRATE (PF) 100 MCG/2ML IJ SOLN: 25.0000 ug | INTRAMUSCULAR | Status: DC | PRN

## 2020-02-14 MED ORDER — FENTANYL CITRATE (PF) 100 MCG/2ML IJ SOLN
INTRAMUSCULAR | Status: AC
  Filled 2020-02-14: qty 2

## 2020-02-14 MED ORDER — DEXMEDETOMIDINE HCL IN NACL 80 MCG/20ML IV SOLN
INTRAVENOUS | Status: AC
  Filled 2020-02-14: qty 20

## 2020-02-14 MED ORDER — FENTANYL CITRATE (PF) 100 MCG/2ML IJ SOLN
INTRAMUSCULAR | Status: DC | PRN
Start: 2020-02-14 — End: 2020-02-14
  Administered 2020-02-14 (×4): 25 ug via INTRAVENOUS

## 2020-02-14 MED ORDER — BUPIVACAINE-EPINEPHRINE (PF) 0.5% -1:200000 IJ SOLN
INTRAMUSCULAR | Status: AC
  Filled 2020-02-14: qty 30

## 2020-02-14 MED ORDER — MIDAZOLAM HCL 2 MG/2ML IJ SOLN
INTRAMUSCULAR | Status: DC | PRN
  Administered 2020-02-14: 2 mg via INTRAVENOUS

## 2020-02-14 MED ORDER — LACTATED RINGERS IV SOLN: INTRAVENOUS | Status: DC

## 2020-02-14 MED ORDER — DEXMEDETOMIDINE HCL 200 MCG/2ML IV SOLN
INTRAVENOUS | Status: DC | PRN
  Administered 2020-02-14: 12 ug via INTRAVENOUS

## 2020-02-14 MED ORDER — FAMOTIDINE 20 MG PO TABS
ORAL_TABLET | ORAL | Status: AC
  Administered 2020-02-14: 20 mg via ORAL
  Filled 2020-02-14: qty 1

## 2020-02-14 MED ORDER — LIDOCAINE HCL (PF) 2 % IJ SOLN
INTRAMUSCULAR | Status: AC
  Filled 2020-02-14: qty 5

## 2020-02-14 MED ORDER — HYDROCODONE-ACETAMINOPHEN 5-325 MG PO TABS
ORAL_TABLET | ORAL | Status: AC
  Administered 2020-02-14: 1 via ORAL
  Filled 2020-02-14: qty 1

## 2020-02-14 MED ORDER — ONDANSETRON HCL 4 MG/2ML IJ SOLN: 4.0000 mg | Freq: Once | INTRAMUSCULAR | Status: DC | PRN

## 2020-02-14 MED ORDER — ORAL CARE MOUTH RINSE: 15.0000 mL | Freq: Once | OROMUCOSAL | Status: AC

## 2020-02-14 MED ORDER — DEXAMETHASONE SODIUM PHOSPHATE 10 MG/ML IJ SOLN
INTRAMUSCULAR | Status: DC | PRN
  Administered 2020-02-14: 10 mg via INTRAVENOUS

## 2020-02-14 MED ORDER — HYDROCODONE-ACETAMINOPHEN 5-325 MG PO TABS: 1.0000 | ORAL_TABLET | ORAL | Status: DC | PRN

## 2020-02-14 MED ORDER — ONDANSETRON HCL 4 MG/2ML IJ SOLN
INTRAMUSCULAR | Status: DC | PRN
  Administered 2020-02-14: 4 mg via INTRAVENOUS

## 2020-02-14 MED ORDER — DEXAMETHASONE SODIUM PHOSPHATE 10 MG/ML IJ SOLN
INTRAMUSCULAR | Status: AC
  Filled 2020-02-14: qty 1

## 2020-02-14 MED ORDER — EPHEDRINE SULFATE 50 MG/ML IJ SOLN
INTRAMUSCULAR | Status: DC | PRN
  Administered 2020-02-14 (×2): 10 mg via INTRAVENOUS

## 2020-02-14 MED ORDER — ONDANSETRON HCL 4 MG/2ML IJ SOLN
INTRAMUSCULAR | Status: AC
  Filled 2020-02-14: qty 2

## 2020-02-14 MED ORDER — CHLORHEXIDINE GLUCONATE 0.12 % MT SOLN: 15.0000 mL | Freq: Once | OROMUCOSAL | Status: AC

## 2020-02-14 MED ORDER — EPHEDRINE 5 MG/ML INJ
INTRAVENOUS | Status: AC
  Filled 2020-02-14: qty 10

## 2020-02-14 MED ORDER — MIDAZOLAM HCL 2 MG/2ML IJ SOLN
INTRAMUSCULAR | Status: AC
  Filled 2020-02-14: qty 2

## 2020-02-14 MED ORDER — FAMOTIDINE 20 MG PO TABS: 20.0000 mg | ORAL_TABLET | Freq: Once | ORAL | Status: AC

## 2020-02-14 MED ORDER — BUPIVACAINE-EPINEPHRINE (PF) 0.5% -1:200000 IJ SOLN
INTRAMUSCULAR | Status: DC | PRN
  Administered 2020-02-14: 30 mL

## 2020-02-14 MED ORDER — PROPOFOL 10 MG/ML IV BOLUS
INTRAVENOUS | Status: DC | PRN
  Administered 2020-02-14: 150 mg via INTRAVENOUS
  Administered 2020-02-14: 50 mg via INTRAVENOUS

## 2020-02-14 MED ORDER — ACETAMINOPHEN 10 MG/ML IV SOLN
INTRAVENOUS | Status: AC
  Filled 2020-02-14: qty 100

## 2020-02-14 MED ORDER — CHLORHEXIDINE GLUCONATE 0.12 % MT SOLN
OROMUCOSAL | Status: AC
  Administered 2020-02-14: 15 mL via OROMUCOSAL
  Filled 2020-02-14: qty 15

## 2020-02-14 MED ORDER — LIDOCAINE HCL (CARDIAC) PF 100 MG/5ML IV SOSY
PREFILLED_SYRINGE | INTRAVENOUS | Status: DC | PRN
  Administered 2020-02-14: 60 mg via INTRAVENOUS

## 2020-02-14 MED ORDER — HYDROCODONE-ACETAMINOPHEN 5-325 MG PO TABS: 1.0000 | ORAL_TABLET | ORAL | 0 refills | Status: DC | PRN

## 2020-02-14 MED ORDER — ACETAMINOPHEN 10 MG/ML IV SOLN
INTRAVENOUS | Status: DC | PRN
  Administered 2020-02-14: 1000 mg via INTRAVENOUS

## 2020-02-14 SURGICAL SUPPLY — 46 items
APL PRP STRL LF DISP 70% ISPRP (MISCELLANEOUS) ×1
BINDER BREAST LRG (GAUZE/BANDAGES/DRESSINGS) IMPLANT
BINDER BREAST MEDIUM (GAUZE/BANDAGES/DRESSINGS) IMPLANT
BINDER BREAST XLRG (GAUZE/BANDAGES/DRESSINGS) ×3 IMPLANT
BINDER BREAST XXLRG (GAUZE/BANDAGES/DRESSINGS) IMPLANT
BLADE BOVIE TIP EXT 4 (BLADE) IMPLANT
BLADE SURG 15 STRL SS SAFETY (BLADE) ×6 IMPLANT
CANISTER SUCT 1200ML W/VALVE (MISCELLANEOUS) ×3 IMPLANT
CHLORAPREP W/TINT 26 (MISCELLANEOUS) ×3 IMPLANT
CLOSURE WOUND 1/2 X4 (GAUZE/BANDAGES/DRESSINGS) ×1
CNTNR SPEC 2.5X3XGRAD LEK (MISCELLANEOUS)
CONT SPEC 4OZ STER OR WHT (MISCELLANEOUS)
CONT SPEC 4OZ STRL OR WHT (MISCELLANEOUS)
CONTAINER SPEC 2.5X3XGRAD LEK (MISCELLANEOUS) IMPLANT
COVER PROBE FLX POLY STRL (MISCELLANEOUS) ×3 IMPLANT
COVER WAND RF STERILE (DRAPES) ×3 IMPLANT
DEVICE DUBIN SPECIMEN MAMMOGRA (MISCELLANEOUS) ×3 IMPLANT
DRAPE LAPAROTOMY 100X77 ABD (DRAPES) ×3 IMPLANT
DRSG GAUZE FLUFF 36X18 (GAUZE/BANDAGES/DRESSINGS) ×3 IMPLANT
DRSG TELFA 4X3 1S NADH ST (GAUZE/BANDAGES/DRESSINGS) ×6 IMPLANT
ELECT CAUTERY BLADE TIP 2.5 (TIP) ×3
ELECT REM PT RETURN 9FT ADLT (ELECTROSURGICAL) ×3
ELECTRODE CAUTERY BLDE TIP 2.5 (TIP) ×1 IMPLANT
ELECTRODE REM PT RTRN 9FT ADLT (ELECTROSURGICAL) ×1 IMPLANT
GLOVE BIO SURGEON STRL SZ7.5 (GLOVE) ×9 IMPLANT
GLOVE INDICATOR 8.0 STRL GRN (GLOVE) ×9 IMPLANT
GOWN STRL REUS W/ TWL LRG LVL3 (GOWN DISPOSABLE) ×3 IMPLANT
GOWN STRL REUS W/TWL LRG LVL3 (GOWN DISPOSABLE) ×9
KIT TURNOVER KIT A (KITS) ×3 IMPLANT
LABEL OR SOLS (LABEL) ×3 IMPLANT
MARGIN MAP 10MM (MISCELLANEOUS) ×6 IMPLANT
NEEDLE HYPO 22GX1.5 SAFETY (NEEDLE) ×3 IMPLANT
NEEDLE HYPO 25X1 1.5 SAFETY (NEEDLE) ×3 IMPLANT
PACK BASIN MINOR (MISCELLANEOUS) ×3 IMPLANT
RETRACTOR RING XSMALL (MISCELLANEOUS) ×1 IMPLANT
RTRCTR WOUND ALEXIS 13CM XS SH (MISCELLANEOUS) ×3
STRIP CLOSURE SKIN 1/2X4 (GAUZE/BANDAGES/DRESSINGS) ×2 IMPLANT
SUT ETHILON 3-0 FS-10 30 BLK (SUTURE) ×3
SUT VIC AB 2-0 CT1 27 (SUTURE) ×3
SUT VIC AB 2-0 CT1 TAPERPNT 27 (SUTURE) ×1 IMPLANT
SUT VIC AB 4-0 FS2 27 (SUTURE) ×3 IMPLANT
SUTURE EHLN 3-0 FS-10 30 BLK (SUTURE) ×1 IMPLANT
SWABSTK COMLB BENZOIN TINCTURE (MISCELLANEOUS) ×3 IMPLANT
SYR 10ML LL (SYRINGE) ×3 IMPLANT
TAPE TRANSPORE STRL 2 31045 (GAUZE/BANDAGES/DRESSINGS) IMPLANT
WATER STERILE IRR 1000ML POUR (IV SOLUTION) ×3 IMPLANT

## 2020-02-14 NOTE — Anesthesia Procedure Notes (Signed)
Procedure Name: LMA Insertion Date/Time: 02/14/2020 11:55 AM Performed by: Jonna Clark, CRNA Pre-anesthesia Checklist: Patient identified, Patient being monitored, Timeout performed, Emergency Drugs available and Suction available Patient Re-evaluated:Patient Re-evaluated prior to induction Oxygen Delivery Method: Circle system utilized Preoxygenation: Pre-oxygenation with 100% oxygen Induction Type: IV induction Ventilation: Mask ventilation without difficulty LMA: LMA inserted LMA Size: 3.5 Tube type: Oral Number of attempts: 1 Placement Confirmation: positive ETCO2 and breath sounds checked- equal and bilateral Tube secured with: Tape Dental Injury: Teeth and Oropharynx as per pre-operative assessment

## 2020-02-14 NOTE — OR Nursing (Signed)
Patient desires discharge at this time, clarified with MD when to restart aspirin. Patient verbalized understanding, discharged home with daughter.

## 2020-02-14 NOTE — H&P (Signed)
[name] is a 64 y.o. female.  HPI  The following portions of the patient's history were reviewed and updated as appropriate.  This an established patient is here today for: office visit. Here for follow up post left breast stereo biopsy 01-07-20, discuss surgery. She states she did bruise from the biopsy.  The patient had reported more difficulty and discomfort with this biopsy in the previous. Entry had been from the medial aspect for a far lateral lesion. Review of Systems  Constitutional: Negative for chills and fever.  Respiratory: Negative for cough and chest tightness.   Chief Complaint  Patient presents with  . Post Operative Visit  left breast stereo biopsy 01-07-20, discuss surgery    BP 132/68  Pulse 61  Temp 36.3 C (97.3 F)  Ht 165.1 cm (5\' 5" )  Wt 77.1 kg (170 lb)  SpO2 97%  BMI 28.29 kg/m   Past Medical History:  Diagnosis Date  . Cardiac murmur  . Essential hypertension, benign 10/11/2012  Last Assessment & Plan: On hctz. Blood pressure doing well. Follow.  . Hematuria  . Hypercholesterolemia 10/11/2012  Last Assessment & Plan: Low cholesterol diet and exercise. Follow lipid panel.  . Incomplete right bundle branch block 10/26/2015  . Mitral regurgitation 10/26/2015  Last Assessment & Plan: Mild MR noted on echo. Continue f/u with cardiology.  . Rectal bleeding 05/01/2015  Last Assessment & Plan: Still with occasional intermittent rectal bleeding. Has not had colonoscopy. Again discussed with her today. Will schedule appt with GI for evaluation. Check cbc.  Marland Kitchen Shortness of breath 10/26/2015    Past Surgical History:  Procedure Laterality Date  . BIOPSY BREAST W/ LOC DEVICE PLACEMENT AND STEREOTACTIC GUIDANCE Left 01/07/2020  . COLONOSCOPY 12/17/2016  Hyperplastic Polyps: CBF 11/2026  . OTHER SURGERY 1969  facial injury    OB History  Gravida  2  Para  2  Term   Preterm   AB   Living    SAB   TAB   Ectopic   Molar    Multiple   Live Births     Obstetric Comments  Age at first period 48 Age of first pregnancy 89 Age at menopause 38 Last period 12-22-2000     Social History   Socioeconomic History  . Marital status: Married  Spouse name: Not on file  . Number of children: Not on file  . Years of education: Not on file  . Highest education level: Not on file  Occupational History  . Not on file  Tobacco Use  . Smoking status: Never Smoker  . Smokeless tobacco: Never Used  Substance and Sexual Activity  . Alcohol use: No  . Drug use: Never  . Sexual activity: Not on file  Other Topics Concern  . Not on file  Social History Narrative  . Not on file   Social Determinants of Health   Financial Resource Strain:  . Difficulty of Paying Living Expenses:  Food Insecurity:  . Worried About Charity fundraiser in the Last Year:  . Arboriculturist in the Last Year:  Transportation Needs:  . Film/video editor (Medical):  Marland Kitchen Lack of Transportation (Non-Medical):    Allergies  Allergen Reactions  . Nitrofurantoin Macrocrystal Unknown  . Penicillins Other (See Comments)  Per childhood - pt unknown reaction   Current Outpatient Medications  Medication Sig Dispense Refill  . aspirin 81 MG EC tablet Take by mouth once daily  . atorvastatin (LIPITOR) 40  MG tablet Take by mouth once daily  . metoprolol succinate (TOPROL-XL) 25 MG XL tablet Take 25 mg by mouth 2 (two) times daily  . multivitamin (MULTIVITAMIN) tablet Take by mouth once daily  . sertraline (ZOLOFT) 50 MG tablet Take 50 mg by mouth once daily   No current facility-administered medications for this visit.   Family History  Problem Relation Age of Onset  . Diabetes Mother  . Ischemic heart disease Mother  . Ischemic heart disease Father  . Prostate cancer Father  . Stroke Father  . Myocardial Infarction (Heart attack) Brother  . Breast cancer Maternal Cousin       Objective:  Physical Exam Constitutional:   Appearance: Normal appearance.  Chest:   Comments: bruising Skin: General: Skin is warm and dry.  Neurological:  Mental Status: She is alert and oriented to person, place, and time.  Psychiatric:  Mood and Affect: Mood normal.  Behavior: Behavior normal.   Labs and Radiology:   August 05, 2019: Retroareolar areolar left breast biopsy:  DIAGNOSIS:  A. BREAST, LEFT 12:00; BIOPSY: (Coil clip) - DETACHED FRAGMENTS OF PAPILLARY EPITHELIAL PROLIFERATION WITH AT LEAST  ATYPICAL DUCTAL HYPERPLASIA.  - SAMPLING OF FIBROUS CYST WALL.  - SEE COMMENT.   Comment:  Calponin and p63 stains were performed and highlight absence of  myoepithelial cells in the papillary epithelial proliferation. The  differential diagnosis includes intraductal papilloma with atypical  ductal hyperplasia (ADH) or ductal carcinoma in situ (DCIS), papillary  ductal carcinoma in situ (DCIS), and encapsulated papillary carcinoma.   The procedure note from August 05, 2019 describes a ribbon clip being placed. Films obtained in May 2021 show a coil clip in the retroareolar area.   Oct 27, 2019 left breast biopsy (X clip) Mammographic distortion.  DIAGNOSIS:  A. BREAST, LEFT LATERAL POSTERIOR; STEREOTACTIC CORE NEEDLE BIOPSY:  - BENIGN FIBROEPITHELIAL LESION WITH SCLEROSIS.  - NEGATIVE FOR ATYPICAL PROLIFERATIVE BREAST DISEASE.   Comment:  The differential diagnosis includes complex fibroadenoma, intraductal  papilloma, or complex sclerosing lesion. The previous biopsy displaying  fragments of papillary epithelial proliferation, with at least atypical  ductal hyperplasia ([id number]) is noted, and reviewed in conjunction  with the current case. Atypical hyperplasia is not identified within  the current sampling.   On post biopsy imaging, not felt to correlate with the area of distortion. Second biopsy recommended and consideration for MRI.   On discussion with radiology re: MRI and Biopsy, with the  former not negating the recommendation for the latter, it was elected to proceed to the second stereotactic biopsy.   January 07, 2020 (X clip & ribbon clip). Originally recommended for biopsy secondary to mammographic distortion, not able to be reproduced on follow-up films. Subsequent small cluster of microcalcifications biopsied.  DIAGNOSIS:  A. LEFT BREAST, UOQ CALCIFICATIONS; STEREOTACTIC BIOPSY:  - ATYPICAL DUCTAL HYPERPLASIA INVOLVING A FIBROEPITHELIAL LESION WITH  SCLEROSIS.  - SEE COMMENT.   Comment:  The differential diagnosis includes complex fibroadenoma, intraductal  papilloma, or complex sclerosing lesion. There is no evidence of in  situ or invasive carcinoma. Calcifications are noted in association  with benign breast tissue.    Assessment:   Multiple, poorly defined areas of abnormality (with the exception of the retroareolar area) with 2 of 3 showing at least atypical ductal hyperplasia.   Plan:   There are indications for surgical resection of the areas of ADH, but the question comes in regards to the intervening tissue. Resection of the two known area of ADH is  likely reasonable.   The patient's case will be presented at the [facility] breast case conference on January 17, 2020. The patient will be notified of the results of that discussion.  Entered by Karie Fetch, RN, acting as a scribe for Dr. Hervey Ard, MD.  The documentation recorded by the scribe accurately reflects the service I personally performed and the decisions made by me.   Robert Bellow, MD FACS   No interval change in clinical history or exam. For excision of areas of ADH.

## 2020-02-14 NOTE — Op Note (Signed)
Preoperative diagnosis: 2 foci of ADH involving the left breast, retroareolar and left upper outer quadrant.  Postoperative diagnosis: Same.  Operative procedure: Excision of foci of ADH from the left breast with wire and ultrasound localization.  Operating surgeon: Hervey Ard, MD.  Anesthesia: General by LMA, Marcaine 0.5% with 1: 200,000 units of epinephrine, 30 cc.  Estimated blood loss: Less than 5 cc.  Clinical note: This 64 year old woman had a very busy screening mammogram and diagnostic studies showed multiple areas of concern.  In the retroareolar area of vacuum biopsy under ultrasound guidance showed a small foci of ADH, possibly related to a papilloma.  She subsequently underwent stereotactic biopsy x2 the most recent showing a similar picture of focal area of ADH.  It was elected to bring the patient to the operating for planned excision of both these areas to help better establish the actual pathology.  It was decided to remove the second biopsy site as no atypia had been identified.  Operative note: The patient underwent wire localization by the radiology service the morning of the procedure.  The case was discussed with the radiologist and films were reviewed prior to surgery.  After the induction of general anesthesia ultrasound was used to identify the location of both localizing wires.  These were about 3 cm apart.  The area of interest in the retroareolar area was about 1 cm above the edge of the areola at the 12 o'clock position.  In the upper outer quadrant it was approximately the 1 o'clock position.  Local anesthesia was infiltrated.  A circumareolar incision from the 9 to 3 o'clock position was made carried down through skin subtendinous tissue with hemostasis achieved electrocautery.  The adipose tissue was elevated off the breast parenchyma and a 3 x 3 x 4 cm block of tissue including both wires were removed.  The first wire had been pulled back and removed from the  patient and was occluded in the specimen just to confirm that it had been removed from the patient.  The second wire tip was intact and the clip previously placed was in the specimen.  Attention was turned to the upper outer quadrant area.  Ultrasound was used again to confirm the location of the tip and was elected to approach this from the circumareolar incision.  An extra small Alexis wound protector was placed and this was used to guide excision.  The lateral incision was made and completed with the knife.  Localizing wire was brought into the wound.  A similar block of tissue 3 x 3 x 4 cm including the pectoralis fascia was excised and specimen radiograph showed both previously placed clips and intact wire.  One clip was at the edge of the specimen and it was elected to take another 8 mm of the lateral edge.  This was placed new lateral edge down on a piece of Telfa and orientated and sent in formalin for routine histology.  The breast and pectoralis fascia was elevated off the underlying muscle and this was then approximated with interrupted 2-0 Vicryl figure-of-eight sutures.  The breast parenchyma was closed in layers with interrupted 2-0 Vicryl figure-of-eight sutures.  Of note, the superficial specimens deep margin was the anterior margin of the new area of excision.  The adipose layer was approximated with 2-0 Vicryl figure-of-eight sutures and the skin closed with a running 4-0 Vicryl subcuticular suture.  Benzoin and Steri-Strips followed by fluff gauze and a compressive wrap were applied.  The patient tolerated the  procedure well and was taken to recovery room in stable condition.

## 2020-02-14 NOTE — Anesthesia Preprocedure Evaluation (Signed)
Anesthesia Evaluation  Patient identified by MRN, date of birth, ID band Patient awake    Reviewed: Allergy & Precautions, H&P , NPO status , Patient's Chart, lab work & pertinent test results, reviewed documented beta blocker date and time   History of Anesthesia Complications Negative for: history of anesthetic complications  Airway Mallampati: II  TM Distance: >3 FB Neck ROM: full    Dental  (+) Dental Advidsory Given, Teeth Intact, Caps, Missing   Pulmonary shortness of breath and with exertion, neg sleep apnea, neg COPD, neg recent URI,    Pulmonary exam normal breath sounds clear to auscultation       Cardiovascular Exercise Tolerance: Good hypertension, (-) angina(-) CAD, (-) Past MI and (-) Cardiac Stents Normal cardiovascular exam+ dysrhythmias (Incomplete RBBB) + Valvular Problems/Murmurs  Rhythm:regular Rate:Normal     Neuro/Psych PSYCHIATRIC DISORDERS Depression negative neurological ROS     GI/Hepatic negative GI ROS, Neg liver ROS,   Endo/Other  negative endocrine ROS  Renal/GU negative Renal ROS  negative genitourinary   Musculoskeletal   Abdominal   Peds  Hematology negative hematology ROS (+)   Anesthesia Other Findings Past Medical History: No date: Chest pain     Comment:  a. 10/2015 MV: abnl ECG w/ inf/antlat ST depression w/               exercise (in setting of LVH @ baseline). No               ischemia/infarct. No date: Dyspnea     Comment:  WITH EXERTION DUE TO ENLARGED HEART No date: Family history of adverse reaction to anesthesia     Comment:  SON-LUNGS FILLED UP WITH FLUID AFTER APPENDECTOMY No date: Hematuria No date: Hypercholesterolemia No date: Hypertension No date: Hypertrophic cardiomegaly     Comment:  a. 10/2019 Echo: EF 70-75%, mod LVH. No rwma. Mild               systolic anterior motion of the mitral valve with mild               LVOT obstruction (peak gradient 20 mmHg at  rest and 26               mmHg with Valsalva).  Normal RV systolic function.                Normal PASP.  Mildly dilated LA. No date: Systolic murmur   Reproductive/Obstetrics negative OB ROS                             Anesthesia Physical Anesthesia Plan  ASA: II  Anesthesia Plan: General   Post-op Pain Management:    Induction: Intravenous  PONV Risk Score and Plan: 3 and Ondansetron, Dexamethasone and Treatment may vary due to age or medical condition  Airway Management Planned: LMA  Additional Equipment:   Intra-op Plan:   Post-operative Plan: Extubation in OR  Informed Consent: I have reviewed the patients History and Physical, chart, labs and discussed the procedure including the risks, benefits and alternatives for the proposed anesthesia with the patient or authorized representative who has indicated his/her understanding and acceptance.     Dental Advisory Given  Plan Discussed with: Anesthesiologist, CRNA and Surgeon  Anesthesia Plan Comments:         Anesthesia Quick Evaluation

## 2020-02-14 NOTE — Discharge Instructions (Signed)

## 2020-02-14 NOTE — Transfer of Care (Signed)
Immediate Anesthesia Transfer of Care Note  Patient: Mary Walter  Procedure(s) Performed: BREAST LUMPECTOMY WITH NEEDLE LOCALIZATION (Left )  Patient Location: PACU  Anesthesia Type:General  Level of Consciousness: drowsy and patient cooperative  Airway & Oxygen Therapy: Patient Spontanous Breathing and Patient connected to face mask oxygen  Post-op Assessment: Report given to RN and Post -op Vital signs reviewed and stable  Post vital signs: Reviewed and stable  Last Vitals:  Vitals Value Taken Time  BP 141/63 02/14/20 1330  Temp    Pulse 82 02/14/20 1330  Resp 16 02/14/20 1330  SpO2 99 % 02/14/20 1330  Vitals shown include unvalidated device data.  Last Pain:  Vitals:   02/14/20 0941  TempSrc: Oral  PainSc: 0-No pain         Complications: No complications documented.

## 2020-02-15 ENCOUNTER — Encounter: Payer: Self-pay | Admitting: General Surgery

## 2020-02-15 NOTE — Anesthesia Postprocedure Evaluation (Signed)
Anesthesia Post Note  Patient: Mary Walter  Procedure(s) Performed: BREAST LUMPECTOMY WITH NEEDLE LOCALIZATION (Left )  Patient location during evaluation: PACU Anesthesia Type: General Level of consciousness: awake and alert Pain management: pain level controlled Vital Signs Assessment: post-procedure vital signs reviewed and stable Respiratory status: spontaneous breathing, nonlabored ventilation, respiratory function stable and patient connected to nasal cannula oxygen Cardiovascular status: blood pressure returned to baseline and stable Postop Assessment: no apparent nausea or vomiting Anesthetic complications: no   No complications documented.   Last Vitals:  Vitals:   02/14/20 1427 02/14/20 1500  BP: 127/67 (!) 145/63  Pulse: 82 81  Resp: 16 18  Temp: 36.6 C 36.7 C  SpO2: 99% 99%    Last Pain:  Vitals:   02/15/20 0825  TempSrc:   PainSc: 0-No pain                 Ara Clan

## 2020-02-21 ENCOUNTER — Other Ambulatory Visit: Payer: Self-pay | Admitting: General Surgery

## 2020-02-22 ENCOUNTER — Other Ambulatory Visit: Payer: Self-pay | Admitting: General Surgery

## 2020-02-22 DIAGNOSIS — D0512 Intraductal carcinoma in situ of left breast: Secondary | ICD-10-CM

## 2020-02-24 ENCOUNTER — Other Ambulatory Visit: Payer: Self-pay | Admitting: Internal Medicine

## 2020-02-24 ENCOUNTER — Other Ambulatory Visit: Payer: Self-pay

## 2020-02-24 ENCOUNTER — Other Ambulatory Visit (HOSPITAL_COMMUNITY)
Admission: RE | Admit: 2020-02-24 | Discharge: 2020-02-24 | Disposition: A | Discharge status: Home / Self Care | Payer: Medicare HMO | Source: Ambulatory Visit | Attending: Nurse Practitioner | Admitting: Nurse Practitioner

## 2020-02-24 ENCOUNTER — Ambulatory Visit (INDEPENDENT_AMBULATORY_CARE_PROVIDER_SITE_OTHER): Payer: Medicare HMO | Admitting: Nurse Practitioner

## 2020-02-24 ENCOUNTER — Encounter: Payer: Self-pay | Admitting: Nurse Practitioner

## 2020-02-24 ENCOUNTER — Other Ambulatory Visit: Payer: Self-pay | Admitting: General Surgery

## 2020-02-24 VITALS — BP 118/62 | HR 67 | Temp 98.1°F | Ht 65.5 in | Wt 169.0 lb

## 2020-02-24 DIAGNOSIS — D0512 Intraductal carcinoma in situ of left breast: Secondary | ICD-10-CM

## 2020-02-24 DIAGNOSIS — R748 Abnormal levels of other serum enzymes: Secondary | ICD-10-CM

## 2020-02-24 DIAGNOSIS — N898 Other specified noninflammatory disorders of vagina: Secondary | ICD-10-CM | POA: Diagnosis not present

## 2020-02-24 DIAGNOSIS — R21 Rash and other nonspecific skin eruption: Secondary | ICD-10-CM | POA: Insufficient documentation

## 2020-02-24 NOTE — Patient Instructions (Addendum)
Please see GYNECOLOGY  for rash. I have placed an urgent referral in to [facility].  Do not apply any topical powders or creams to the genitals at this time.   You may apply over the counter steroid cortisone skin cream to the spot under left underarm and back of neck. Do not apply to the genital area.    Rash, Adult A rash is a change in the color of your skin. A rash can also change the way your skin feels. There are many different conditions and factors that can cause a rash. Some rashes may disappear after a few days, but some may last for a few weeks. Common causes of rashes include:  Viral infections, such as: ? Colds. ? Measles. ? Hand, foot, and mouth disease.  Bacterial infections, such as: ? Scarlet fever. ? Impetigo.  Fungal infections, such as Candida.  Allergic reactions to food, medicines, or skin care products. Follow these instructions at home: The goal of treatment is to stop the itching and keep the rash from spreading. Pay attention to any changes in your symptoms. Follow these instructions to help with your condition: Medicine Take or apply over-the-counter and prescription medicines only as told by your health care provider. These may include:  Corticosteroid creams to treat red or swollen skin.  Anti-itch lotions.  Oral allergy medicines (antihistamines).  Oral corticosteroids for severe symptoms.  Skin care  Apply cool compresses to the affected areas.  Do not scratch or rub your skin.  Avoid covering the rash. Make sure the rash is exposed to air as much as possible. Managing itching and discomfort  Avoid hot showers or baths, which can make itching worse. A cold shower may help.  Try taking a bath with: ? Epsom salts. Follow manufacturer instructions on the packaging. You can get these at your local pharmacy or grocery store. ? Baking soda. Pour a small amount into the bath as told by your health care provider. ? Colloidal oatmeal. Follow  manufacturer instructions on the packaging. You can get this at your local pharmacy or grocery store.  Try applying baking soda paste to your skin. Stir water into baking soda until it reaches a paste-like consistency.  Try applying calamine lotion. This is an over-the-counter lotion that helps to relieve itchiness.  Keep cool and out of the sun. Sweating and being hot can make itching worse. General instructions   Rest as needed.  Drink enough fluid to keep your urine pale yellow.  Wear loose-fitting clothing.  Avoid scented soaps, detergents, and perfumes. Use gentle soaps, detergents, perfumes, and other cosmetic products.  Avoid any substance that causes your rash. Keep a journal to help track what causes your rash. Write down: ? What you eat. ? What cosmetic products you use. ? What you drink. ? What you wear. This includes jewelry.  Keep all follow-up visits as told by your health care provider. This is important. Contact a health care provider if:  You sweat at night.  You lose weight.  You urinate more than normal.  You urinate less than normal, or you notice that your urine is a darker color than usual.  You feel weak.  You vomit.  Your skin or the whites of your eyes look yellow (jaundice).  Your skin: ? Tingles. ? Is numb.  Your rash: ? Does not go away after several days. ? Gets worse.  You are: ? Unusually thirsty. ? More tired than normal.  You have: ? New symptoms. ?  Pain in your abdomen. ? A fever. ? Diarrhea. Get help right away if you:  Have a fever and your symptoms suddenly get worse.  Develop confusion.  Have a severe headache or a stiff neck.  Have severe joint pains or stiffness.  Have a seizure.  Develop a rash that covers all or most of your body. The rash may or may not be painful.  Develop blisters that: ? Are on top of the rash. ? Grow larger or grow together. ? Are painful. ? Are inside your nose or  mouth.  Develop a rash that: ? Looks like purple pinprick-sized spots all over your body. ? Has a "bull's eye" or looks like a target. ? Is not related to sun exposure, is red and painful, and causes your skin to peel. Summary  A rash is a change in the color of your skin. Some rashes disappear after a few days, but some may last for a few weeks.  The goal of treatment is to stop the itching and keep the rash from spreading.  Take or apply over-the-counter and prescription medicines only as told by your health care provider.  Contact a health care provider if you have new or worsening symptoms.  Keep all follow-up visits as told by your health care provider. This is important. This information is not intended to replace advice given to you by your health care provider. Make sure you discuss any questions you have with your health care provider. Document Revised: 10/02/2018 Document Reviewed: 01/12/2018 Elsevier Patient Education  [place].

## 2020-02-24 NOTE — Progress Notes (Signed)
Established Patient Office Visit  Subjective:  [name], female    DOB: July 18, 1955  Age: 64 y.o. MRN: [id number]  CC:  Chief Complaint  Patient presents with  . Acute Visit    vaginal itching    HPI Mary Walter presents for vaginal irritation.  About 6 months ago she had vaginal irritation and was given empiric nystatin cream which she used on and off with some improvement.  She has also been applying cornstarch.  About 1-2 weeks ago, she noticed external vaginal itching, burning, and saw red circles rash.  She tried the nystatin cream for 1 week without improvement there is no vaginal discharge.  She also has itchy rash 5 lesions on the posterior neck, and one a red, splotchy, non pruritic, flat spot under her left axilla.  No new soaps or lotions. She did undergo a breast biopsy last Monday with anesthesia and did receive medication for that.  No antibiotics.  Past Medical History:  Diagnosis Date  . Chest pain    a. 10/2015 MV: abnl ECG w/ inf/antlat ST depression w/ exercise (in setting of LVH @ baseline). No ischemia/infarct.  Marland Kitchen Dyspnea    WITH EXERTION DUE TO ENLARGED HEART  . Family history of adverse reaction to anesthesia    SON-LUNGS FILLED UP WITH FLUID AFTER APPENDECTOMY  . Hematuria   . Hypercholesterolemia   . Hypertension   . Hypertrophic cardiomegaly    a. 10/2019 Echo: EF 70-75%, mod LVH. No rwma. Mild systolic anterior motion of the mitral valve with mild LVOT obstruction (peak gradient 20 mmHg at rest and 26 mmHg with Valsalva).  Normal RV systolic function.  Normal PASP.  Mildly dilated LA.  Marland Kitchen Systolic murmur     Past Surgical History:  Procedure Laterality Date  . BREAST BIOPSY Left 10/27/2019   Affirm bx-distortion-     clip-path ADH  . BREAST BIOPSY Left 01/07/2020   Affim bx 1 area/ x clip and ribbon clip/ ADH  . BREAST LUMPECTOMY WITH NEEDLE LOCALIZATION Left 02/14/2020   Procedure: BREAST LUMPECTOMY WITH NEEDLE LOCALIZATION;   Surgeon: Robert Bellow, MD;  Location: [facility] ORS;  Service: General;  Laterality: Left;  . FACIAL FRACTURE SURGERY     X 2    Family History  Problem Relation Age of Onset  . Diabetes Mother   . Heart disease Mother   . Hypertension Mother   . Cancer Father        prostate   . Diverticulitis Father   . CVA Father   . Heart disease Father   . Stroke Father   . Hypertension Father   . Heart murmur Father   . Heart attack Brother   . CVA Paternal Grandmother   . Breast cancer Cousin     Social History   Socioeconomic History  . Marital status: Married    Spouse name: Not on file  . Number of children: 2  . Years of education: Not on file  . Highest education level: Not on file  Occupational History  . Not on file  Tobacco Use  . Smoking status: Never Smoker  . Smokeless tobacco: Never Used  Vaping Use  . Vaping Use: Never used  Substance and Sexual Activity  . Alcohol use: No    Alcohol/week: 0.0 standard drinks  . Drug use: No  . Sexual activity: Not on file  Other Topics Concern  . Not on file  Social History Narrative  . Not on  file   Social Determinants of Health   Financial Resource Strain:   . Difficulty of Paying Living Expenses: Not on file  Food Insecurity:   . Worried About Charity fundraiser in the Last Year: Not on file  . Ran Out of Food in the Last Year: Not on file  Transportation Needs:   . Lack of Transportation (Medical): Not on file  . Lack of Transportation (Non-Medical): Not on file  Physical Activity:   . Days of Exercise per Week: Not on file  . Minutes of Exercise per Session: Not on file  Stress:   . Feeling of Stress : Not on file  Social Connections:   . Frequency of Communication with Friends and Family: Not on file  . Frequency of Social Gatherings with Friends and Family: Not on file  . Attends Religious Services: Not on file  . Active Member of Clubs or Organizations: Not on file  . Attends Archivist  Meetings: Not on file  . Marital Status: Not on file  Intimate Partner Violence:   . Fear of Current or Ex-Partner: Not on file  . Emotionally Abused: Not on file  . Physically Abused: Not on file  . Sexually Abused: Not on file    Outpatient Medications Prior to Visit  Medication Sig Dispense Refill  . aspirin EC 81 MG EC tablet Take 1 tablet (81 mg total) by mouth daily. 30 tablet 0  . atorvastatin (LIPITOR) 40 MG tablet Take 1 tablet (40 mg total) by mouth daily. (Patient taking differently: Take 40 mg by mouth every morning. ) 30 tablet 6  . HYDROcodone-acetaminophen (NORCO/VICODIN) 5-325 MG tablet Take 1 tablet by mouth every 4 (four) hours as needed for moderate pain. 12 tablet 0  . metoprolol tartrate (LOPRESSOR) 25 MG tablet Take 1 tablet (25 mg total) by mouth 2 (two) times daily. 60 tablet 6  . Multiple Vitamin (MULTIVITAMIN) tablet Take 1 tablet by mouth daily.    . sertraline (ZOLOFT) 50 MG tablet Take 1 tablet (50 mg total) by mouth daily. (Patient taking differently: Take 50 mg by mouth every morning. ) 90 tablet 2   No facility-administered medications prior to visit.    Allergies  Allergen Reactions  . Macrobid [Nitrofurantoin Macrocrystal]     unknown  . Penicillins     Childhood allergy    Review of Systems Pertinent positives as noted in history of present illness otherwise negative.   Objective:    Physical Exam Vitals reviewed. Exam conducted with a chaperone present.  Constitutional:      Appearance: Normal appearance.  Abdominal:     Palpations: Abdomen is soft.     Tenderness: There is no abdominal tenderness.  Genitourinary:    Pubic Area: Rash present. No pubic lice.      Labia:        Right: Rash present. No tenderness.        Left: Rash present. No tenderness.      Comments: Multiple small round shaped red macular lesions noted scattered about the external vagina.  None in the introitus.  None inside the vaginal vault.  Speculum exam is  normal.  No unusual discharge.  Wet prep Q-tip sent. Skin:    Comments: Of 33 red small macular lesions at the base of the neck, and one irregular red macular lesion noted under the left axilla.   Neurological:     General: No focal deficit present.     Mental Status: She  is alert and oriented to person, place, and time.  Psychiatric:        Mood and Affect: Mood normal.        Behavior: Behavior normal.     BP 118/62 (BP Location: Left Arm, Patient Position: Sitting, Cuff Size: Normal)   Pulse 67   Temp 98.1 F (36.7 C) (Oral)   Ht 5' 5.5" (1.664 m)   Wt 169 lb (76.7 kg)   LMP 06/24/2010   SpO2 97%   BMI 27.70 kg/m  Wt Readings from Last 3 Encounters:  02/24/20 169 lb (76.7 kg)  02/14/20 169 lb (76.7 kg)  01/19/20 171 lb 2 oz (77.6 kg)   Pulse Readings from Last 3 Encounters:  02/24/20 67  02/14/20 81  02/14/20 60    BP Readings from Last 3 Encounters:  02/24/20 118/62  02/14/20 (!) 145/63  02/14/20 136/67    Lab Results  Component Value Date   CHOL 201 (H) 11/20/2019   HDL 35 (L) 11/20/2019   LDLCALC 135 (H) 11/20/2019   LDLDIRECT 57 01/19/2020   TRIG 157 (H) 11/20/2019   CHOLHDL 5.7 11/20/2019      Health Maintenance Due  Topic Date Due  . Hepatitis C Screening  Never done  . TETANUS/TDAP  Never done  . DEXA SCAN  Never done  . PNA vac Low Risk Adult (1 of 2 - PCV13) Never done  . INFLUENZA VACCINE  01/23/2020    There are no preventive care reminders to display for this patient.  Lab Results  Component Value Date   TSH 1.63 04/20/2019   Lab Results  Component Value Date   WBC 10.0 11/19/2019   HGB 14.6 11/19/2019   HCT 41.9 11/19/2019   MCV 87.5 11/19/2019   PLT 208 11/19/2019   Lab Results  Component Value Date   NA 138 11/19/2019   K 3.7 11/19/2019   CO2 23 11/19/2019   GLUCOSE 90 11/19/2019   BUN 15 11/19/2019   CREATININE 0.90 11/19/2019   BILITOT 0.8 11/17/2019   ALKPHOS 99 11/17/2019   AST 25 01/19/2020   ALT 27 01/19/2020    PROT 6.6 11/17/2019   ALBUMIN 4.3 11/17/2019   CALCIUM 10.3 11/19/2019   ANIONGAP 9 11/19/2019   GFR 64.49 11/17/2019   Lab Results  Component Value Date   CHOL 201 (H) 11/20/2019   Lab Results  Component Value Date   HDL 35 (L) 11/20/2019   Lab Results  Component Value Date   LDLCALC 135 (H) 11/20/2019   Lab Results  Component Value Date   TRIG 157 (H) 11/20/2019   Lab Results  Component Value Date   CHOLHDL 5.7 11/20/2019   Lab Results  Component Value Date   HGBA1C 5.2 03/30/2013      Assessment & Plan:   Problem List Items Addressed This Visit    None    Visit Diagnoses    Rash of genitalia    -  Primary   Relevant Orders   Ambulatory referral to Obstetrics / Gynecology      No orders of the defined types were placed in this encounter. Etiology of the rash is unclear.  Dr. Derrel Nip came in to examine.  We are thinking possibly some type of lichen planus, but will likely need biopsy for definitive diagnosis. Please see GYNECOLOGY  for rash. I have placed an urgent referral in to [facility].  Do not apply any topical powders or creams to the genitals at this time.  You may apply over the counter steroid cortisone skin cream to the spot under left underarm and back of neck. Do not apply to the genital area.   Follow-up: No follow-ups on file.   This visit occurred during the SARS-CoV-2 public health emergency.  Safety protocols were in place, including screening questions prior to the visit, additional usage of staff PPE, and extensive cleaning of exam room while observing appropriate contact time as indicated for disinfecting solutions.   Denice Paradise, NP

## 2020-02-24 NOTE — Progress Notes (Signed)
Order placed for f/u lab.   

## 2020-02-25 ENCOUNTER — Telehealth: Payer: Self-pay | Admitting: Obstetrics and Gynecology

## 2020-02-25 LAB — CERVICOVAGINAL ANCILLARY ONLY
Bacterial Vaginitis (gardnerella): NEGATIVE
Candida Glabrata: NEGATIVE
Candida Vaginitis: NEGATIVE
Chlamydia: NEGATIVE
Comment: NEGATIVE
Comment: NEGATIVE
Comment: NEGATIVE
Comment: NEGATIVE
Comment: NEGATIVE
Comment: NORMAL
Neisseria Gonorrhea: NEGATIVE
Trichomonas: NEGATIVE

## 2020-02-25 LAB — SURGICAL PATHOLOGY

## 2020-02-25 NOTE — Telephone Encounter (Signed)
Pt called in and stated that she has a referral sent in from here PCP. The pt said that it was a urgent referral. I told the pt I see the referral that I can schedule her to be seen next week. The pt stated that what does urgent mean to you I should of been called before now. I told the pt that Im sorry she hasn't been called. That we reach out 2-3 days after the referral is placed. The pt had a rude tone and was snappy when I answered her back. I made her appt for the 1st open appointment that we had. The pt stated that she will have to deal with this all weekend. I told the pt that was the soonest appointment I have. The pt was okay when we got off the phone

## 2020-02-26 ENCOUNTER — Encounter: Payer: Self-pay | Admitting: Nurse Practitioner

## 2020-02-29 ENCOUNTER — Encounter: Payer: Self-pay | Admitting: Obstetrics and Gynecology

## 2020-02-29 ENCOUNTER — Ambulatory Visit (INDEPENDENT_AMBULATORY_CARE_PROVIDER_SITE_OTHER): Payer: Medicare HMO | Admitting: Obstetrics and Gynecology

## 2020-02-29 ENCOUNTER — Other Ambulatory Visit: Payer: Self-pay

## 2020-02-29 ENCOUNTER — Telehealth: Payer: Self-pay | Admitting: Internal Medicine

## 2020-02-29 VITALS — BP 117/63 | HR 60 | Ht 65.0 in | Wt 170.1 lb

## 2020-02-29 DIAGNOSIS — L308 Other specified dermatitis: Secondary | ICD-10-CM

## 2020-02-29 DIAGNOSIS — L989 Disorder of the skin and subcutaneous tissue, unspecified: Secondary | ICD-10-CM

## 2020-02-29 MED ORDER — METHYLPREDNISOLONE 4 MG PO TBPK: ORAL_TABLET | ORAL | 0 refills | Status: AC

## 2020-02-29 NOTE — Telephone Encounter (Signed)
Patient aware of lab results.

## 2020-02-29 NOTE — Telephone Encounter (Addendum)
Patient called in stated that she was returning call for results and  Can call her at work [phone] ext: [phone]

## 2020-02-29 NOTE — Progress Notes (Signed)
HPI:      [name] is a 64 y.o. G0P0000 who LMP was Patient's last menstrual period was 06/24/2010.  Subjective:   She presents today after seeing her PCP for a red rash on her labia.  With further questioning the patient has had the rash for approximately 2 and half weeks and it seems to be spreading around her labia.  Again with further questioning she noted a small area under her left arm and on the back of her neck that has been present for even longer than the spots on her labia. She reports no new sexual partners denies vaginal discharge.  She has tried nystatin and vaginal cream without success. Patient states that not only or the area is red but they occasionally cause significant itching. She reports no new soaps detergents etc.    Hx: The following portions of the patient's history were reviewed and updated as appropriate:             She  has a past medical history of Chest pain, Dyspnea, Family history of adverse reaction to anesthesia, Hematuria, Hypercholesterolemia, Hypertension, Hypertrophic cardiomegaly, and Systolic murmur. She does not have any pertinent problems on file. She  has a past surgical history that includes Facial fracture surgery; Breast biopsy (Left, 10/27/2019); Breast biopsy (Left, 01/07/2020); and Breast lumpectomy with needle localization (Left, 02/14/2020). Her family history includes Breast cancer in her cousin; CVA in her father and paternal grandmother; Cancer in her father; Diabetes in her mother; Diverticulitis in her father; Heart attack in her brother; Heart disease in her father and mother; Heart murmur in her father; Hypertension in her father and mother; Stroke in her father. She  reports that she has never smoked. She has never used smokeless tobacco. She reports that she does not drink alcohol and does not use drugs. She has a current medication list which includes the following prescription(s): aspirin, atorvastatin,  hydrocodone-acetaminophen, metoprolol tartrate, multivitamin, sertraline, and methylprednisolone. She is allergic to macrobid [nitrofurantoin macrocrystal] and penicillins.       Review of Systems:  Review of Systems  Constitutional: Denied constitutional symptoms, night sweats, recent illness, fatigue, fever, insomnia and weight loss.  Eyes: Denied eye symptoms, eye pain, photophobia, vision change and visual disturbance.  Ears/Nose/Throat/Neck: Denied ear, nose, throat or neck symptoms, hearing loss, nasal discharge, sinus congestion and sore throat.  Cardiovascular: Denied cardiovascular symptoms, arrhythmia, chest pain/pressure, edema, exercise intolerance, orthopnea and palpitations.  Respiratory: Denied pulmonary symptoms, asthma, pleuritic pain, productive sputum, cough, dyspnea and wheezing.  Gastrointestinal: Denied, gastro-esophageal reflux, melena, nausea and vomiting.  Genitourinary: See HPI for additional information.  Musculoskeletal: Denied musculoskeletal symptoms, stiffness, swelling, muscle weakness and myalgia.  Dermatologic: Denied dermatology symptoms, rash and scar.  Neurologic: Denied neurology symptoms, dizziness, headache, neck pain and syncope.  Psychiatric: Denied psychiatric symptoms, anxiety and depression.  Endocrine: Denied endocrine symptoms including hot flashes and night sweats.   Meds:   Current Outpatient Medications on File Prior to Visit  Medication Sig Dispense Refill  . aspirin EC 81 MG EC tablet Take 1 tablet (81 mg total) by mouth daily. 30 tablet 0  . atorvastatin (LIPITOR) 40 MG tablet Take 1 tablet (40 mg total) by mouth daily. (Patient taking differently: Take 40 mg by mouth every morning. ) 30 tablet 6  . HYDROcodone-acetaminophen (NORCO/VICODIN) 5-325 MG tablet Take 1 tablet by mouth every 4 (four) hours as needed for moderate pain. 12 tablet 0  . metoprolol tartrate (LOPRESSOR) 25 MG tablet Take  1 tablet (25 mg total) by mouth 2 (two) times  daily. 60 tablet 6  . Multiple Vitamin (MULTIVITAMIN) tablet Take 1 tablet by mouth daily.    . sertraline (ZOLOFT) 50 MG tablet Take 1 tablet (50 mg total) by mouth daily. (Patient taking differently: Take 50 mg by mouth every morning. ) 90 tablet 2   No current facility-administered medications on file prior to visit.    Objective:     Vitals:   02/29/20 1013  BP: 117/63  Pulse: 60              Physical examination     Pelvic:   Vulva:  Patient with several circular appearing nonulcerated erythematous lesions on both left and right labia.  There is a lesion located near the anus and 2 small appearing satellite lesions up toward the mons.  Vagina: No lesions or abnormalities noted.  Support: Normal pelvic support.  Urethra No masses tenderness or scarring.  Meatus Normal size without lesions or prolapse.     Anus: Normal exam.  No lesions.  Perineum: Normal exam.  No lesions.   Left axillary area shows similar lesion to what is noted on the labia Back of her neck also has a similar appearing lesion.  Assessment:    G0P0000 Patient Active Problem List   Diagnosis Date Noted  . Rash of genitalia 02/24/2020  . Chest tightness 11/19/2019  . Chest pain 11/19/2019  . Elevated troponin   . Depression   . Mitral regurgitation 10/26/2015  . Shortness of breath 10/26/2015  . Incomplete right bundle branch block 10/26/2015  . Family history of premature CAD 10/26/2015  . Health care maintenance 09/06/2014  . Stress 03/13/2014  . Hematuria 10/11/2012  . Hyperlipidemia 10/11/2012  . Essential hypertension 10/11/2012     1. Inflammatory dermatosis        Plan:            1.  Because this does not appear infectious but rather inflammatory we will try a taper dose of steroids. Orders No orders of the defined types were placed in this encounter.    Meds ordered this encounter  Medications  . methylPREDNISolone (MEDROL DOSEPAK) 4 MG TBPK tablet    Sig: Take 1  tablet (4 mg total) by mouth 5 (five) times daily for 1 day, THEN 1 tablet (4 mg total) 4 (four) times daily for 1 day, THEN 1 tablet (4 mg total) in the morning, at noon, and at bedtime for 1 day, THEN 1 tablet (4 mg total) in the morning, at noon, and at bedtime for 1 day, THEN 1 tablet (4 mg total) 2 (two) times daily for 1 day, THEN 1 tablet (4 mg total) 2 (two) times daily for 1 day. Day 1 : 1 tab 5 X daily Day 2 : 1 tab 4 X daily Days 3 & 4  : 1 tab 3 X daily Days 5 & 6 : 1 tab 2 X daily  Day 7 : 1 tab.    Dispense:  21 tablet    Refill:  0      F/U  No follow-ups on file. I spent 33 minutes involved in the care of this patient preparing to see the patient by obtaining and reviewing her medical history (including labs, imaging tests and prior procedures), documenting clinical information in the electronic health record (EHR), counseling and coordinating care plans, writing and sending prescriptions, ordering tests or procedures and directly communicating with the patient by discussing  pertinent items from her history and physical exam as well as detailing my assessment and plan as noted above so that she has an informed understanding.  All of her questions were answered.  Finis Bud, M.D. 02/29/2020 11:52 AM

## 2020-03-06 ENCOUNTER — Ambulatory Visit
Admission: RE | Admit: 2020-03-06 | Discharge: 2020-03-06 | Disposition: A | Discharge status: Home / Self Care | Payer: Medicare HMO | Source: Ambulatory Visit | Attending: Radiation Oncology | Admitting: Radiation Oncology

## 2020-03-06 ENCOUNTER — Encounter: Payer: Self-pay | Admitting: Radiation Oncology

## 2020-03-06 ENCOUNTER — Other Ambulatory Visit: Payer: Self-pay

## 2020-03-06 VITALS — BP 148/70 | HR 56 | Temp 97.5°F | Wt 170.0 lb

## 2020-03-06 DIAGNOSIS — Z803 Family history of malignant neoplasm of breast: Secondary | ICD-10-CM | POA: Diagnosis not present

## 2020-03-06 DIAGNOSIS — Z79899 Other long term (current) drug therapy: Secondary | ICD-10-CM | POA: Diagnosis not present

## 2020-03-06 DIAGNOSIS — I517 Cardiomegaly: Secondary | ICD-10-CM | POA: Insufficient documentation

## 2020-03-06 DIAGNOSIS — I10 Essential (primary) hypertension: Secondary | ICD-10-CM | POA: Diagnosis not present

## 2020-03-06 DIAGNOSIS — Z7982 Long term (current) use of aspirin: Secondary | ICD-10-CM | POA: Diagnosis not present

## 2020-03-06 DIAGNOSIS — R011 Cardiac murmur, unspecified: Secondary | ICD-10-CM | POA: Diagnosis not present

## 2020-03-06 DIAGNOSIS — R079 Chest pain, unspecified: Secondary | ICD-10-CM | POA: Insufficient documentation

## 2020-03-06 DIAGNOSIS — D0512 Intraductal carcinoma in situ of left breast: Secondary | ICD-10-CM | POA: Insufficient documentation

## 2020-03-06 DIAGNOSIS — Z800 Family history of malignant neoplasm of digestive organs: Secondary | ICD-10-CM | POA: Diagnosis not present

## 2020-03-06 DIAGNOSIS — E7800 Pure hypercholesterolemia, unspecified: Secondary | ICD-10-CM | POA: Insufficient documentation

## 2020-03-06 DIAGNOSIS — R0602 Shortness of breath: Secondary | ICD-10-CM | POA: Insufficient documentation

## 2020-03-06 NOTE — Consult Note (Signed)
NEW PATIENT EVALUATION  Name: Mary Walter  MRN: [id number]  Date:   03/06/2020     DOB: 1956-05-27   This 64 y.o. female patient presents to the clinic for initial evaluation of stage 0 (Tis N0 M0) ductal carcinoma in situ of the   REFERRING PHYSICIAN: Einar Pheasant, MD  CHIEF COMPLAINT:  Chief Complaint  Patient presents with  . Consult    DIAGNOSIS: The encounter diagnosis was Ductal carcinoma in situ (DCIS) of right breast.   PREVIOUS INVESTIGATIONS:  Mammograms ultrasound reviewed Pathology report reviewed Clinical notes reviewed  HPI: Patient is a 64 year old female who presented with an abnormal mammogram of her left breast.  She presented with some architectural distortion in the retroareolar portion of the left breast at the 12 o'clock position.  She underwent multiple biopsies eventually confirming ductal carcinoma in situ ER/PR status pending.  She eventually underwent a wide local excision for at least 3 mm grade 1 ductal carcinoma in situ in the retroareolar region.  All margins were clear at least 2 mm.  No regional lymph nodes were submitted.  Patient has done well postoperatively she is seen today for radiation oncology opinion.  She specifically denies breast tenderness cough or bone pain does have some tenderness secondary to scar tissue in her axilla.  PLANNED TREATMENT REGIMEN: Hypofractionated left whole breast radiation  PAST MEDICAL HISTORY:  has a past medical history of Chest pain, Dyspnea, Family history of adverse reaction to anesthesia, Hematuria, Hypercholesterolemia, Hypertension, Hypertrophic cardiomegaly, and Systolic murmur.    PAST SURGICAL HISTORY:  Past Surgical History:  Procedure Laterality Date  . BREAST BIOPSY Left 10/27/2019   Affirm bx-distortion-     clip-path ADH  . BREAST BIOPSY Left 01/07/2020   Affim bx 1 area/ x clip and ribbon clip/ ADH  . BREAST LUMPECTOMY WITH NEEDLE LOCALIZATION Left 02/14/2020   Procedure: BREAST  LUMPECTOMY WITH NEEDLE LOCALIZATION;  Surgeon: Robert Bellow, MD;  Location: [facility] ORS;  Service: General;  Laterality: Left;  . FACIAL FRACTURE SURGERY     X 2    FAMILY HISTORY: family history includes Breast cancer in her cousin; CVA in her father and paternal grandmother; Cancer in her father; Diabetes in her mother; Diverticulitis in her father; Heart attack in her brother; Heart disease in her father and mother; Heart murmur in her father; Hypertension in her father and mother; Stroke in her father.  SOCIAL HISTORY:  reports that she has never smoked. She has never used smokeless tobacco. She reports that she does not drink alcohol and does not use drugs.  ALLERGIES: Macrobid [nitrofurantoin macrocrystal] and Penicillins  MEDICATIONS:  Current Outpatient Medications  Medication Sig Dispense Refill  . aspirin EC 81 MG EC tablet Take 1 tablet (81 mg total) by mouth daily. 30 tablet 0  . atorvastatin (LIPITOR) 40 MG tablet Take 1 tablet (40 mg total) by mouth daily. (Patient taking differently: Take 40 mg by mouth every morning. ) 30 tablet 6  . HYDROcodone-acetaminophen (NORCO/VICODIN) 5-325 MG tablet Take 1 tablet by mouth every 4 (four) hours as needed for moderate pain. 12 tablet 0  . methylPREDNISolone (MEDROL DOSEPAK) 4 MG TBPK tablet Take 1 tablet (4 mg total) by mouth 5 (five) times daily for 1 day, THEN 1 tablet (4 mg total) 4 (four) times daily for 1 day, THEN 1 tablet (4 mg total) in the morning, at noon, and at bedtime for 1 day, THEN 1 tablet (4 mg total) in the morning, at noon, and  at bedtime for 1 day, THEN 1 tablet (4 mg total) 2 (two) times daily for 1 day, THEN 1 tablet (4 mg total) 2 (two) times daily for 1 day. Day 1 : 1 tab 5 X daily Day 2 : 1 tab 4 X daily Days 3 & 4  : 1 tab 3 X daily Days 5 & 6 : 1 tab 2 X daily  Day 7 : 1 tab. 21 tablet 0  . metoprolol tartrate (LOPRESSOR) 25 MG tablet Take 1 tablet (25 mg total) by mouth 2 (two) times daily. 60 tablet 6  .  Multiple Vitamin (MULTIVITAMIN) tablet Take 1 tablet by mouth daily.    . sertraline (ZOLOFT) 50 MG tablet Take 1 tablet (50 mg total) by mouth daily. (Patient taking differently: Take 50 mg by mouth every morning. ) 90 tablet 2   No current facility-administered medications for this encounter.    ECOG PERFORMANCE STATUS: 1  REVIEW OF SYSTEMS: Patient denies any weight loss, fatigue, weakness, fever, chills or night sweats. Patient denies any loss of vision, blurred vision. Patient denies any ringing  of the ears or hearing loss. No irregular heartbeat. Patient denies heart murmur or history of fainting. Patient denies any chest pain or pain radiating to her upper extremities. Patient denies any shortness of breath, difficulty breathing at night, cough or hemoptysis. Patient denies any swelling in the lower legs. Patient denies any nausea vomiting, vomiting of blood, or coffee ground material in the vomitus. Patient denies any stomach pain. Patient states has had normal bowel movements no significant constipation or diarrhea. Patient denies any dysuria, hematuria or significant nocturia. Patient denies any problems walking, swelling in the joints or loss of balance. Patient denies any skin changes, loss of hair or loss of weight. Patient denies any excessive worrying or anxiety or significant depression. Patient denies any problems with insomnia. Patient denies excessive thirst, polyuria, polydipsia. Patient denies any swollen glands, patient denies easy bruising or easy bleeding. Patient denies any recent infections, allergies or URI. Patient "s visual fields have not changed significantly in recent time.   PHYSICAL EXAM: BP (!) 148/70 (BP Location: Right Arm, Patient Position: Sitting, Cuff Size: Normal)   Pulse (!) 56   Temp (!) 97.5 F (36.4 C) (Tympanic)   Wt 170 lb (77.1 kg)   LMP 06/24/2010   BMI 28.29 kg/m  She is status post wide local excision of the left breast.  No dominant mass or  nodularity is noted in either breast in 2 positions examined.  No axillary or supraclavicular adenopathy is appreciated.  Well-developed well-nourished patient in NAD. HEENT reveals PERLA, EOMI, discs not visualized.  Oral cavity is clear. No oral mucosal lesions are identified. Neck is clear without evidence of cervical or supraclavicular adenopathy. Lungs are clear to A&P. Cardiac examination is essentially unremarkable with regular rate and rhythm without murmur rub or thrill. Abdomen is benign with no organomegaly or masses noted. Motor sensory and DTR levels are equal and symmetric in the upper and lower extremities. Cranial nerves II through XII are grossly intact. Proprioception is intact. No peripheral adenopathy or edema is identified. No motor or sensory levels are noted. Crude visual fields are within normal range.  LABORATORY DATA: Pathology report reviewed    RADIOLOGY RESULTS: Mammogram and ultrasound reviewed CT scans of the chest also reviewed does have an incidental 4 mm pulmonary nodule which will be observed   IMPRESSION: Stage 0 (Tis N0 M0) ductal carcinoma in situ ER/PR status pending  and 64 year old female status post wide local excision  PLAN: At this time I have recommended whole breast radiation and hypofractionated radiation regimen over 3 weeks.  Would also boost her scar another 1000 cGy in 5 fractions.  Risks and benefits of treatment occluding skin reaction fatigue alteration of blood counts possible inclusion of superficial lung all were described in detail to the patient.  I personally set up and ordered CT simulation for later this week.  Depending on ER status she may benefit from antiestrogen therapy.  Patient comprehends my recommendations well.  I would like to take this opportunity to thank you for allowing me to participate in the care of your patient.Noreene Filbert, MD

## 2020-03-09 ENCOUNTER — Ambulatory Visit
Admission: RE | Admit: 2020-03-09 | Discharge: 2020-03-09 | Disposition: A | Discharge status: Home / Self Care | Payer: Medicare HMO | Source: Ambulatory Visit | Attending: Radiation Oncology | Admitting: Radiation Oncology

## 2020-03-09 DIAGNOSIS — D0511 Intraductal carcinoma in situ of right breast: Secondary | ICD-10-CM | POA: Insufficient documentation

## 2020-03-09 DIAGNOSIS — Z510 Encounter for antineoplastic radiation therapy: Secondary | ICD-10-CM | POA: Insufficient documentation

## 2020-03-09 DIAGNOSIS — D0512 Intraductal carcinoma in situ of left breast: Secondary | ICD-10-CM | POA: Diagnosis not present

## 2020-03-10 ENCOUNTER — Other Ambulatory Visit: Payer: Self-pay | Admitting: *Deleted

## 2020-03-10 DIAGNOSIS — Z510 Encounter for antineoplastic radiation therapy: Secondary | ICD-10-CM | POA: Diagnosis not present

## 2020-03-10 DIAGNOSIS — D0511 Intraductal carcinoma in situ of right breast: Secondary | ICD-10-CM

## 2020-03-10 DIAGNOSIS — D0512 Intraductal carcinoma in situ of left breast: Secondary | ICD-10-CM | POA: Diagnosis not present

## 2020-03-16 ENCOUNTER — Ambulatory Visit: Admission: RE | Admit: 2020-03-16 | Payer: Medicare HMO | Source: Ambulatory Visit

## 2020-03-16 ENCOUNTER — Ambulatory Visit: Payer: Medicare HMO

## 2020-03-16 DIAGNOSIS — Z510 Encounter for antineoplastic radiation therapy: Secondary | ICD-10-CM | POA: Diagnosis not present

## 2020-03-16 DIAGNOSIS — D0512 Intraductal carcinoma in situ of left breast: Secondary | ICD-10-CM | POA: Diagnosis not present

## 2020-03-20 ENCOUNTER — Ambulatory Visit
Admission: RE | Admit: 2020-03-20 | Discharge: 2020-03-20 | Disposition: A | Discharge status: Home / Self Care | Payer: Medicare HMO | Source: Ambulatory Visit | Attending: Radiation Oncology | Admitting: Radiation Oncology

## 2020-03-20 ENCOUNTER — Ambulatory Visit: Payer: Medicare HMO

## 2020-03-20 DIAGNOSIS — Z510 Encounter for antineoplastic radiation therapy: Secondary | ICD-10-CM | POA: Diagnosis not present

## 2020-03-20 DIAGNOSIS — D0512 Intraductal carcinoma in situ of left breast: Secondary | ICD-10-CM | POA: Diagnosis not present

## 2020-03-21 ENCOUNTER — Ambulatory Visit
Admission: RE | Admit: 2020-03-21 | Discharge: 2020-03-21 | Disposition: A | Discharge status: Home / Self Care | Payer: Medicare HMO | Source: Ambulatory Visit | Attending: Radiation Oncology | Admitting: Radiation Oncology

## 2020-03-21 ENCOUNTER — Ambulatory Visit: Payer: Medicare HMO

## 2020-03-21 DIAGNOSIS — D0512 Intraductal carcinoma in situ of left breast: Secondary | ICD-10-CM | POA: Diagnosis not present

## 2020-03-21 DIAGNOSIS — Z510 Encounter for antineoplastic radiation therapy: Secondary | ICD-10-CM | POA: Diagnosis not present

## 2020-03-22 ENCOUNTER — Ambulatory Visit: Payer: Medicare HMO

## 2020-03-22 ENCOUNTER — Ambulatory Visit
Admission: RE | Admit: 2020-03-22 | Discharge: 2020-03-22 | Disposition: A | Discharge status: Home / Self Care | Payer: Medicare HMO | Source: Ambulatory Visit | Attending: Radiation Oncology | Admitting: Radiation Oncology

## 2020-03-22 DIAGNOSIS — Z510 Encounter for antineoplastic radiation therapy: Secondary | ICD-10-CM | POA: Diagnosis not present

## 2020-03-22 DIAGNOSIS — D0512 Intraductal carcinoma in situ of left breast: Secondary | ICD-10-CM | POA: Diagnosis not present

## 2020-03-23 ENCOUNTER — Ambulatory Visit
Admission: RE | Admit: 2020-03-23 | Discharge: 2020-03-23 | Disposition: A | Discharge status: Home / Self Care | Payer: Medicare HMO | Source: Ambulatory Visit | Attending: Radiation Oncology | Admitting: Radiation Oncology

## 2020-03-23 ENCOUNTER — Ambulatory Visit: Payer: Medicare HMO

## 2020-03-23 DIAGNOSIS — Z510 Encounter for antineoplastic radiation therapy: Secondary | ICD-10-CM | POA: Diagnosis not present

## 2020-03-24 ENCOUNTER — Other Ambulatory Visit: Payer: Self-pay | Admitting: Internal Medicine

## 2020-03-24 ENCOUNTER — Ambulatory Visit: Payer: Medicare HMO

## 2020-03-24 ENCOUNTER — Ambulatory Visit
Admission: RE | Admit: 2020-03-24 | Discharge: 2020-03-24 | Disposition: A | Discharge status: Home / Self Care | Payer: Medicare HMO | Source: Ambulatory Visit | Attending: Radiation Oncology | Admitting: Radiation Oncology

## 2020-03-24 DIAGNOSIS — D0512 Intraductal carcinoma in situ of left breast: Secondary | ICD-10-CM | POA: Diagnosis not present

## 2020-03-24 DIAGNOSIS — Z510 Encounter for antineoplastic radiation therapy: Secondary | ICD-10-CM | POA: Insufficient documentation

## 2020-03-24 DIAGNOSIS — D0511 Intraductal carcinoma in situ of right breast: Secondary | ICD-10-CM | POA: Insufficient documentation

## 2020-03-27 ENCOUNTER — Ambulatory Visit
Admission: RE | Admit: 2020-03-27 | Discharge: 2020-03-27 | Disposition: A | Discharge status: Home / Self Care | Payer: Medicare HMO | Source: Ambulatory Visit | Attending: Radiation Oncology | Admitting: Radiation Oncology

## 2020-03-27 ENCOUNTER — Ambulatory Visit: Payer: Medicare HMO

## 2020-03-27 DIAGNOSIS — D0512 Intraductal carcinoma in situ of left breast: Secondary | ICD-10-CM | POA: Diagnosis not present

## 2020-03-27 DIAGNOSIS — Z510 Encounter for antineoplastic radiation therapy: Secondary | ICD-10-CM | POA: Diagnosis not present

## 2020-03-28 ENCOUNTER — Ambulatory Visit: Payer: Medicare HMO

## 2020-03-28 ENCOUNTER — Ambulatory Visit
Admission: RE | Admit: 2020-03-28 | Discharge: 2020-03-28 | Disposition: A | Discharge status: Home / Self Care | Payer: Medicare HMO | Source: Ambulatory Visit | Attending: Radiation Oncology | Admitting: Radiation Oncology

## 2020-03-28 DIAGNOSIS — Z510 Encounter for antineoplastic radiation therapy: Secondary | ICD-10-CM | POA: Diagnosis not present

## 2020-03-28 DIAGNOSIS — D0512 Intraductal carcinoma in situ of left breast: Secondary | ICD-10-CM | POA: Diagnosis not present

## 2020-03-29 ENCOUNTER — Ambulatory Visit
Admission: RE | Admit: 2020-03-29 | Discharge: 2020-03-29 | Disposition: A | Discharge status: Home / Self Care | Payer: Medicare HMO | Source: Ambulatory Visit | Attending: Radiation Oncology | Admitting: Radiation Oncology

## 2020-03-29 ENCOUNTER — Ambulatory Visit: Payer: Medicare HMO

## 2020-03-29 DIAGNOSIS — D0512 Intraductal carcinoma in situ of left breast: Secondary | ICD-10-CM | POA: Diagnosis not present

## 2020-03-29 DIAGNOSIS — Z510 Encounter for antineoplastic radiation therapy: Secondary | ICD-10-CM | POA: Diagnosis not present

## 2020-03-30 ENCOUNTER — Ambulatory Visit: Payer: Medicare HMO

## 2020-03-30 ENCOUNTER — Ambulatory Visit
Admission: RE | Admit: 2020-03-30 | Discharge: 2020-03-30 | Disposition: A | Discharge status: Home / Self Care | Payer: Medicare HMO | Source: Ambulatory Visit | Attending: Radiation Oncology | Admitting: Radiation Oncology

## 2020-03-30 DIAGNOSIS — D0512 Intraductal carcinoma in situ of left breast: Secondary | ICD-10-CM | POA: Diagnosis not present

## 2020-03-30 DIAGNOSIS — Z510 Encounter for antineoplastic radiation therapy: Secondary | ICD-10-CM | POA: Diagnosis not present

## 2020-03-31 ENCOUNTER — Ambulatory Visit
Admission: RE | Admit: 2020-03-31 | Discharge: 2020-03-31 | Disposition: A | Discharge status: Home / Self Care | Payer: Medicare HMO | Source: Ambulatory Visit | Attending: Radiation Oncology | Admitting: Radiation Oncology

## 2020-03-31 ENCOUNTER — Ambulatory Visit: Payer: Medicare HMO

## 2020-03-31 DIAGNOSIS — D0512 Intraductal carcinoma in situ of left breast: Secondary | ICD-10-CM | POA: Diagnosis not present

## 2020-03-31 DIAGNOSIS — Z510 Encounter for antineoplastic radiation therapy: Secondary | ICD-10-CM | POA: Diagnosis not present

## 2020-04-03 ENCOUNTER — Ambulatory Visit: Payer: Medicare HMO

## 2020-04-03 ENCOUNTER — Ambulatory Visit
Admission: RE | Admit: 2020-04-03 | Discharge: 2020-04-03 | Disposition: A | Discharge status: Home / Self Care | Payer: Medicare HMO | Source: Ambulatory Visit | Attending: Radiation Oncology | Admitting: Radiation Oncology

## 2020-04-03 ENCOUNTER — Inpatient Hospital Stay: Payer: Medicare HMO | Attending: Radiation Oncology

## 2020-04-03 ENCOUNTER — Other Ambulatory Visit: Payer: Self-pay

## 2020-04-03 DIAGNOSIS — D0511 Intraductal carcinoma in situ of right breast: Secondary | ICD-10-CM

## 2020-04-03 DIAGNOSIS — D0512 Intraductal carcinoma in situ of left breast: Secondary | ICD-10-CM | POA: Diagnosis not present

## 2020-04-03 DIAGNOSIS — Z510 Encounter for antineoplastic radiation therapy: Secondary | ICD-10-CM | POA: Diagnosis not present

## 2020-04-03 LAB — CBC
HCT: 39.3 % (ref 36.0–46.0)
Hemoglobin: 13.9 g/dL (ref 12.0–15.0)
MCH: 31.2 pg (ref 26.0–34.0)
MCHC: 35.4 g/dL (ref 30.0–36.0)
MCV: 88.1 fL (ref 80.0–100.0)
Platelets: 200 10*3/uL (ref 150–400)
RBC: 4.46 MIL/uL (ref 3.87–5.11)
RDW: 13.7 % (ref 11.5–15.5)
WBC: 9.4 10*3/uL (ref 4.0–10.5)
nRBC: 0 % (ref 0.0–0.2)

## 2020-04-04 ENCOUNTER — Ambulatory Visit: Payer: Medicare HMO

## 2020-04-04 ENCOUNTER — Ambulatory Visit
Admission: RE | Admit: 2020-04-04 | Discharge: 2020-04-04 | Disposition: A | Discharge status: Home / Self Care | Payer: Medicare HMO | Source: Ambulatory Visit | Attending: Radiation Oncology | Admitting: Radiation Oncology

## 2020-04-04 DIAGNOSIS — D0512 Intraductal carcinoma in situ of left breast: Secondary | ICD-10-CM | POA: Diagnosis not present

## 2020-04-04 DIAGNOSIS — Z510 Encounter for antineoplastic radiation therapy: Secondary | ICD-10-CM | POA: Diagnosis not present

## 2020-04-05 ENCOUNTER — Ambulatory Visit
Admission: RE | Admit: 2020-04-05 | Discharge: 2020-04-05 | Disposition: A | Discharge status: Home / Self Care | Payer: Medicare HMO | Source: Ambulatory Visit | Attending: Radiation Oncology | Admitting: Radiation Oncology

## 2020-04-05 ENCOUNTER — Ambulatory Visit: Payer: Medicare HMO

## 2020-04-05 DIAGNOSIS — D0512 Intraductal carcinoma in situ of left breast: Secondary | ICD-10-CM | POA: Diagnosis not present

## 2020-04-05 DIAGNOSIS — Z510 Encounter for antineoplastic radiation therapy: Secondary | ICD-10-CM | POA: Diagnosis not present

## 2020-04-06 ENCOUNTER — Ambulatory Visit
Admission: RE | Admit: 2020-04-06 | Discharge: 2020-04-06 | Disposition: A | Discharge status: Home / Self Care | Payer: Medicare HMO | Source: Ambulatory Visit | Attending: Radiation Oncology | Admitting: Radiation Oncology

## 2020-04-06 ENCOUNTER — Ambulatory Visit: Payer: Medicare HMO

## 2020-04-06 DIAGNOSIS — Z510 Encounter for antineoplastic radiation therapy: Secondary | ICD-10-CM | POA: Diagnosis not present

## 2020-04-07 ENCOUNTER — Ambulatory Visit
Admission: RE | Admit: 2020-04-07 | Discharge: 2020-04-07 | Disposition: A | Discharge status: Home / Self Care | Payer: Medicare HMO | Source: Ambulatory Visit | Attending: Radiation Oncology | Admitting: Radiation Oncology

## 2020-04-07 ENCOUNTER — Ambulatory Visit: Payer: Medicare HMO

## 2020-04-07 DIAGNOSIS — Z510 Encounter for antineoplastic radiation therapy: Secondary | ICD-10-CM | POA: Diagnosis not present

## 2020-04-07 DIAGNOSIS — D0512 Intraductal carcinoma in situ of left breast: Secondary | ICD-10-CM | POA: Diagnosis not present

## 2020-04-10 ENCOUNTER — Ambulatory Visit: Payer: Medicare HMO

## 2020-04-10 ENCOUNTER — Ambulatory Visit
Admission: RE | Admit: 2020-04-10 | Discharge: 2020-04-10 | Disposition: A | Discharge status: Home / Self Care | Payer: Medicare HMO | Source: Ambulatory Visit | Attending: Radiation Oncology | Admitting: Radiation Oncology

## 2020-04-10 DIAGNOSIS — D0512 Intraductal carcinoma in situ of left breast: Secondary | ICD-10-CM | POA: Diagnosis not present

## 2020-04-10 DIAGNOSIS — Z510 Encounter for antineoplastic radiation therapy: Secondary | ICD-10-CM | POA: Diagnosis not present

## 2020-04-11 ENCOUNTER — Ambulatory Visit: Payer: Medicare HMO

## 2020-04-11 ENCOUNTER — Ambulatory Visit
Admission: RE | Admit: 2020-04-11 | Discharge: 2020-04-11 | Disposition: A | Discharge status: Home / Self Care | Payer: Medicare HMO | Source: Ambulatory Visit | Attending: Radiation Oncology | Admitting: Radiation Oncology

## 2020-04-11 DIAGNOSIS — Z510 Encounter for antineoplastic radiation therapy: Secondary | ICD-10-CM | POA: Diagnosis not present

## 2020-04-12 ENCOUNTER — Ambulatory Visit: Payer: Medicare HMO

## 2020-04-12 ENCOUNTER — Ambulatory Visit
Admission: RE | Admit: 2020-04-12 | Discharge: 2020-04-12 | Disposition: A | Discharge status: Home / Self Care | Payer: Medicare HMO | Source: Ambulatory Visit | Attending: Radiation Oncology | Admitting: Radiation Oncology

## 2020-04-12 DIAGNOSIS — Z510 Encounter for antineoplastic radiation therapy: Secondary | ICD-10-CM | POA: Diagnosis not present

## 2020-04-13 ENCOUNTER — Ambulatory Visit: Payer: Medicare HMO

## 2020-04-13 ENCOUNTER — Ambulatory Visit
Admission: RE | Admit: 2020-04-13 | Discharge: 2020-04-13 | Disposition: A | Discharge status: Home / Self Care | Payer: Medicare HMO | Source: Ambulatory Visit | Attending: Radiation Oncology | Admitting: Radiation Oncology

## 2020-04-13 DIAGNOSIS — Z510 Encounter for antineoplastic radiation therapy: Secondary | ICD-10-CM | POA: Diagnosis not present

## 2020-04-14 ENCOUNTER — Ambulatory Visit: Payer: Medicare HMO

## 2020-04-14 ENCOUNTER — Ambulatory Visit
Admission: RE | Admit: 2020-04-14 | Discharge: 2020-04-14 | Disposition: A | Discharge status: Home / Self Care | Payer: Medicare HMO | Source: Ambulatory Visit | Attending: Radiation Oncology | Admitting: Radiation Oncology

## 2020-04-14 DIAGNOSIS — D0512 Intraductal carcinoma in situ of left breast: Secondary | ICD-10-CM | POA: Diagnosis not present

## 2020-04-14 DIAGNOSIS — Z510 Encounter for antineoplastic radiation therapy: Secondary | ICD-10-CM | POA: Diagnosis not present

## 2020-04-17 ENCOUNTER — Ambulatory Visit: Payer: Medicare HMO

## 2020-04-17 ENCOUNTER — Ambulatory Visit
Admission: RE | Admit: 2020-04-17 | Discharge: 2020-04-17 | Disposition: A | Discharge status: Home / Self Care | Payer: Medicare HMO | Source: Ambulatory Visit | Attending: Radiation Oncology | Admitting: Radiation Oncology

## 2020-04-17 DIAGNOSIS — Z510 Encounter for antineoplastic radiation therapy: Secondary | ICD-10-CM | POA: Diagnosis not present

## 2020-04-27 ENCOUNTER — Telehealth: Payer: Self-pay

## 2020-04-27 NOTE — Telephone Encounter (Signed)
Called and left a voicemail to call and schedule for a physical.

## 2020-04-30 NOTE — Progress Notes (Signed)
Cardiology Office Note  Date:  05/01/2020   ID:  Mary Walter, Mary Walter Aug 14, 1955, MRN [id number]  PCP:  Mary Pheasant, MD   Chief Complaint  Patient presents with  . Follow-up    3 months  Pt states no new Sx. She states she is feeling more tired than before. She has been having radiation treatments for Stage 0 breast cancer.    HPI:  Mary Walter is a 64 y.o. female with a strong family history of coronary artery disease, mother with CABG in her 64s, brother with stent/MI in his 106s, history of murmur  dating back over 5 years, hypertension,  Moderate LVH In the hospital 11/19/19 with  2 episodes of chest tightness with vomiting Who presents for f/u of her chest pain  Discussed recent developments in detail,  Diagnosed with breast cancer, underwent surgery, completing XRT 21 cycles  Has periodic atypical chest pain and left arm pain 30 min, for one episode she was laying down Same as 2 months ago Does not seem to be associated with exertion  Prior records reviewed Echo with hyperdynamic LVEF 70 to 75%, moderate LVH, mild LVOT obstruction with a peak gradient of 20 mmHg at rest and 26 mmHg with Valsalva  coronary CTA 12/16/2019 with coronary calcium score of 182 placing her in the 88th percentile for age/sex, mild (25-49%) calcified plaque in proximal LAD, recommended for risk factor modification (LV wall thickness was not detailed)    PMH:   has a past medical history of Chest pain, Dyspnea, Family history of adverse reaction to anesthesia, Hematuria, Hypercholesterolemia, Hypertension, Hypertrophic cardiomegaly, and Systolic murmur.  PSH:    Past Surgical History:  Procedure Laterality Date  . BREAST BIOPSY Left 10/27/2019   Affirm bx-distortion-     clip-path ADH  . BREAST BIOPSY Left 01/07/2020   Affim bx 1 area/ x clip and ribbon clip/ ADH  . BREAST LUMPECTOMY WITH NEEDLE LOCALIZATION Left 02/14/2020   Procedure: BREAST LUMPECTOMY WITH NEEDLE LOCALIZATION;   Surgeon: Robert Bellow, MD;  Location: [facility] ORS;  Service: General;  Laterality: Left;  . FACIAL FRACTURE SURGERY     X 2    Current Outpatient Medications  Medication Sig Dispense Refill  . aspirin EC 81 MG EC tablet Take 1 tablet (81 mg total) by mouth daily. 30 tablet 0  . atorvastatin (LIPITOR) 40 MG tablet Take 1 tablet (40 mg total) by mouth daily. (Patient taking differently: Take 40 mg by mouth every morning. ) 30 tablet 6  . Multiple Vitamin (MULTIVITAMIN) tablet Take 1 tablet by mouth daily.    . sertraline (ZOLOFT) 50 MG tablet Take 1 tablet by mouth once daily 90 tablet 0  . metoprolol succinate (TOPROL XL) 25 MG 24 hr tablet Take 1 tablet (25 mg total) by mouth daily. 90 tablet 3   No current facility-administered medications for this visit.    Allergies:   Macrobid [nitrofurantoin macrocrystal] and Penicillins   Social History:  The patient  reports that she has never smoked. She has never used smokeless tobacco. She reports that she does not drink alcohol and does not use drugs.   Family History:   family history includes Breast cancer in her cousin; CVA in her father and paternal grandmother; Cancer in her father; Diabetes in her mother; Diverticulitis in her father; Heart attack in her brother; Heart disease in her father and mother; Heart murmur in her father; Hypertension in her father and mother; Stroke in her father.  Review of Systems: Review of Systems  Constitutional: Negative.   HENT: Negative.   Respiratory: Negative.   Cardiovascular: Positive for chest pain.  Gastrointestinal: Negative.   Musculoskeletal: Negative.   Neurological: Negative.   Psychiatric/Behavioral: Negative.   All other systems reviewed and are negative.   PHYSICAL EXAM: VS:  BP 106/60   Pulse 67   Ht 5\' 5"  (1.651 m)   Wt 168 lb (76.2 kg)   LMP 06/24/2010   BMI 27.96 kg/m  , BMI Body mass index is 27.96 kg/m. GEN: Well nourished, well developed, in no acute  distress HEENT: normal Neck: no JVD, carotid bruits, or masses Cardiac: RRR; no murmurs, rubs, or gallops,no edema  Respiratory:  clear to auscultation bilaterally, normal work of breathing GI: soft, nontender, nondistended, + BS MS: no deformity or atrophy Skin: warm and dry, no rash Neuro:  Strength and sensation are intact Psych: euthymic mood, full affect   Recent Labs: 11/19/2019: BUN 15; Creatinine, Ser 0.90; Potassium 3.7; Sodium 138 01/19/2020: ALT 27 04/03/2020: Hemoglobin 13.9; Platelets 200    Lipid Panel Lab Results  Component Value Date   CHOL 201 (H) 11/20/2019   HDL 35 (L) 11/20/2019   LDLCALC 135 (H) 11/20/2019   TRIG 157 (H) 11/20/2019      Wt Readings from Last 3 Encounters:  05/01/20 168 lb (76.2 kg)  03/06/20 170 lb (77.1 kg)  02/29/20 170 lb 2 oz (77.2 kg)       ASSESSMENT AND PLAN:  Problem List Items Addressed This Visit      Cardiology Problems   Essential hypertension   Relevant Medications   metoprolol succinate (TOPROL XL) 25 MG 24 hr tablet   Other Relevant Orders   Lipid panel   Hepatic function panel    Other Visit Diagnoses    Coronary artery disease of native artery of native heart with stable angina pectoris ([place])    -  Primary   Relevant Medications   metoprolol succinate (TOPROL XL) 25 MG 24 hr tablet   Other Relevant Orders   Lipid panel   Hepatic function panel   LVH (left ventricular hypertrophy)       Relevant Medications   metoprolol succinate (TOPROL XL) 25 MG 24 hr tablet   Other Relevant Orders   Lipid panel   Hepatic function panel   Hyperlipidemia LDL goal <70       Relevant Medications   metoprolol succinate (TOPROL XL) 25 MG 24 hr tablet   Other Relevant Orders   Lipid panel   Hepatic function panel     Chest pain Atypical in nature, Unable to exclude spasm.  If symptoms persist or get worse, could consider medication such as Ranexa Blood pressure very low, and she does not want nitrates Continue  current regiment. She is happy to watch it for now  LVH Reported to be moderate in nature mild outflow tract obstruction, murmur not particularly impressive on today's visit Recommend she stay hydrated  Essential hypertension Blood pressure is well controlled on today's visit. No changes made to the medications. We will change her metoprolol tartrate to metoprolol succinate 25 mg daily for ease of use of everything is once a day in the morning  Hyperlipidemia Lab work drawn today, lipids, LFTs  Coronary calcification Seen on CT scan, nonobstructive Stressed importance of aggressive lipid control   Total encounter time more than 25 minutes  Greater than 50% was spent in counseling and coordination of care with the patient  Signed, Esmond Plants, M.D., Ph.D. [place], [place]

## 2020-05-01 ENCOUNTER — Other Ambulatory Visit: Payer: Self-pay

## 2020-05-01 ENCOUNTER — Ambulatory Visit (INDEPENDENT_AMBULATORY_CARE_PROVIDER_SITE_OTHER): Payer: Medicare HMO | Admitting: Cardiovascular Disease

## 2020-05-01 ENCOUNTER — Encounter: Payer: Self-pay | Admitting: Cardiovascular Disease

## 2020-05-01 VITALS — BP 106/60 | HR 67 | Ht 65.0 in | Wt 168.0 lb

## 2020-05-01 DIAGNOSIS — I25118 Atherosclerotic heart disease of native coronary artery with other forms of angina pectoris: Secondary | ICD-10-CM

## 2020-05-01 DIAGNOSIS — E785 Hyperlipidemia, unspecified: Secondary | ICD-10-CM | POA: Diagnosis not present

## 2020-05-01 DIAGNOSIS — I10 Essential (primary) hypertension: Secondary | ICD-10-CM | POA: Diagnosis not present

## 2020-05-01 DIAGNOSIS — I517 Cardiomegaly: Secondary | ICD-10-CM | POA: Diagnosis not present

## 2020-05-01 MED ORDER — METOPROLOL SUCCINATE ER 25 MG PO TB24: 25.0000 mg | ORAL_TABLET | Freq: Every day | ORAL | 3 refills | Status: DC

## 2020-05-01 NOTE — Patient Instructions (Addendum)
Medication Instructions:  When you run out of the metoprolol tartrate, Change to metoprolol succinate 25 mg once a day (take in the Am with others)  If you need a refill on your cardiac medications before your next appointment, please call your pharmacy.    Lab work:  Lipid panel, LFTS today   If you have labs (blood work) drawn today and your tests are completely normal, you will receive your results only by: Marland Kitchen MyChart Message (if you have MyChart) OR . A paper copy in the mail If you have any lab test that is abnormal or we need to change your treatment, we will call you to review the results.   Testing/Procedures: No new testing needed   Follow-Up: At [facility], you and your health needs are our priority.  As part of our continuing mission to provide you with exceptional heart care, we have created designated Provider Care Teams.  These Care Teams include your primary Cardiologist (physician) and Advanced Practice Providers (APPs -  Physician Assistants and Nurse Practitioners) who all work together to provide you with the care you need, when you need it.  . You will need a follow up appointment in 12 months  . Providers on your designated Care Team:   . Murray Hodgkins, NP . Christell Faith, PA-C . Marrianne Mood, PA-C  Any Other Special Instructions Will Be Listed Below (If Applicable).  COVID-19 Vaccine Information can be found at: ShippingScam.co.uk For questions related to vaccine distribution or appointments, please email vaccine@ .com or call [phone].

## 2020-05-02 LAB — HEPATIC FUNCTION PANEL
ALT: 29 IU/L (ref 0–32)
AST: 26 IU/L (ref 0–40)
Albumin: 4.4 g/dL (ref 3.8–4.8)
Alkaline Phosphatase: 126 IU/L — ABNORMAL HIGH (ref 44–121)
Bilirubin Total: 1 mg/dL (ref 0.0–1.2)
Bilirubin, Direct: 0.25 mg/dL (ref 0.00–0.40)
Total Protein: 6.6 g/dL (ref 6.0–8.5)

## 2020-05-02 LAB — LIPID PANEL
Chol/HDL Ratio: 2.9 ratio (ref 0.0–4.4)
Cholesterol, Total: 126 mg/dL (ref 100–199)
HDL: 44 mg/dL (ref >39)
LDL Chol Calc (NIH): 60 mg/dL (ref 0–99)
Triglycerides: 122 mg/dL (ref 0–149)
VLDL Cholesterol Cal: 22 mg/dL (ref 5–40)

## 2020-05-09 ENCOUNTER — Other Ambulatory Visit: Payer: Self-pay | Admitting: General Surgery

## 2020-05-09 DIAGNOSIS — D0512 Intraductal carcinoma in situ of left breast: Secondary | ICD-10-CM

## 2020-05-10 ENCOUNTER — Other Ambulatory Visit: Payer: Medicare HMO

## 2020-05-25 ENCOUNTER — Other Ambulatory Visit: Payer: Self-pay

## 2020-05-25 ENCOUNTER — Ambulatory Visit
Admission: RE | Admit: 2020-05-25 | Discharge: 2020-05-25 | Disposition: A | Discharge status: Home / Self Care | Payer: Medicare HMO | Source: Ambulatory Visit | Attending: Radiation Oncology | Admitting: Radiation Oncology

## 2020-05-25 ENCOUNTER — Encounter: Payer: Self-pay | Admitting: Radiation Oncology

## 2020-05-25 VITALS — BP 145/78 | HR 74 | Temp 97.8°F | Wt 168.0 lb

## 2020-05-25 DIAGNOSIS — Z170 Estrogen receptor positive status [ER+]: Secondary | ICD-10-CM | POA: Insufficient documentation

## 2020-05-25 DIAGNOSIS — D0511 Intraductal carcinoma in situ of right breast: Secondary | ICD-10-CM | POA: Insufficient documentation

## 2020-05-25 DIAGNOSIS — Z923 Personal history of irradiation: Secondary | ICD-10-CM | POA: Insufficient documentation

## 2020-05-25 DIAGNOSIS — D0512 Intraductal carcinoma in situ of left breast: Secondary | ICD-10-CM

## 2020-05-26 NOTE — Progress Notes (Addendum)
Radiation Oncology Follow up Note  Name: Mary Walter   Date:   05/25/2020 MRN:  [id number] DOB: 04-12-1956    This 64 y.o. female presents to the clinic today for 1 month follow-up status post whole breast radiation to her left breast for ductal carcinoma in situ stage 0.  REFERRING PROVIDER: Einar Pheasant, MD  HPI: Patient is a 64 year old female now out 1 month having completed whole breast radiation to her left breast for ductal carcinoma in situ.  She is seen in routine follow-up is doing well specifically denies breast tenderness cough or bone pain she is scheduled for bone density study for determination of appropriate antiestrogen therapy.  COMPLICATIONS OF TREATMENT: none  FOLLOW UP COMPLIANCE: keeps appointments   PHYSICAL EXAM:  BP (!) 145/78   Pulse 74   Temp 97.8 F (36.6 C) (Tympanic)   Wt 168 lb (76.2 kg)   LMP 06/24/2010   BMI 27.96 kg/m  Lungs are clear to A&P cardiac examination essentially unremarkable with regular rate and rhythm. No dominant mass or nodularity is noted in either breast in 2 positions examined. Incision is well-healed. No axillary or supraclavicular adenopathy is appreciated. Cosmetic result is excellent.  Well-developed well-nourished patient in NAD. HEENT reveals PERLA, EOMI, discs not visualized.  Oral cavity is clear. No oral mucosal lesions are identified. Neck is clear without evidence of cervical or supraclavicular adenopathy. Lungs are clear to A&P. Cardiac examination is essentially unremarkable with regular rate and rhythm without murmur rub or thrill. Abdomen is benign with no organomegaly or masses noted. Motor sensory and DTR levels are equal and symmetric in the upper and lower extremities. Cranial nerves II through XII are grossly intact. Proprioception is intact. No peripheral adenopathy or edema is identified. No motor or sensory levels are noted. Crude visual fields are within normal range.  RADIOLOGY RESULTS: No current films  for review  PLAN: Present time patient is doing well recovering nicely from her whole breast radiation and surgery.  She continues follow-up with Dr. Tollie Pizza.  He is planning bone density study and will make appropriate determination of about antiestrogen therapy.  I have asked to see her back in 4 to 5 months for follow-up.  Patient knows to call with any concerns.  I would like to take this opportunity to thank you for allowing me to participate in the care of your patient.Noreene Filbert, MD

## 2020-06-01 ENCOUNTER — Other Ambulatory Visit: Payer: Self-pay

## 2020-06-01 ENCOUNTER — Ambulatory Visit
Admission: RE | Admit: 2020-06-01 | Discharge: 2020-06-01 | Disposition: A | Discharge status: Home / Self Care | Payer: Medicare HMO | Source: Ambulatory Visit | Attending: General Surgery | Admitting: General Surgery

## 2020-06-01 DIAGNOSIS — Z87828 Personal history of other (healed) physical injury and trauma: Secondary | ICD-10-CM | POA: Diagnosis not present

## 2020-06-01 DIAGNOSIS — Z13820 Encounter for screening for osteoporosis: Secondary | ICD-10-CM | POA: Insufficient documentation

## 2020-06-01 DIAGNOSIS — Z853 Personal history of malignant neoplasm of breast: Secondary | ICD-10-CM | POA: Diagnosis not present

## 2020-06-01 DIAGNOSIS — D0512 Intraductal carcinoma in situ of left breast: Secondary | ICD-10-CM

## 2020-06-01 DIAGNOSIS — M8589 Other specified disorders of bone density and structure, multiple sites: Secondary | ICD-10-CM | POA: Diagnosis not present

## 2020-06-01 DIAGNOSIS — Z923 Personal history of irradiation: Secondary | ICD-10-CM | POA: Diagnosis not present

## 2020-06-08 DIAGNOSIS — D0510 Intraductal carcinoma in situ of unspecified breast: Secondary | ICD-10-CM | POA: Diagnosis not present

## 2020-06-09 DIAGNOSIS — R69 Illness, unspecified: Secondary | ICD-10-CM | POA: Diagnosis not present

## 2020-06-21 ENCOUNTER — Other Ambulatory Visit: Payer: Self-pay | Admitting: Internal Medicine

## 2020-06-29 ENCOUNTER — Other Ambulatory Visit: Payer: Self-pay

## 2020-06-29 ENCOUNTER — Other Ambulatory Visit: Payer: Self-pay | Admitting: General Surgery

## 2020-06-29 ENCOUNTER — Ambulatory Visit (INDEPENDENT_AMBULATORY_CARE_PROVIDER_SITE_OTHER): Payer: Medicare HMO | Admitting: Internal Medicine

## 2020-06-29 VITALS — BP 130/70 | HR 77 | Temp 98.4°F | Resp 16 | Ht 65.0 in | Wt 171.0 lb

## 2020-06-29 DIAGNOSIS — D0512 Intraductal carcinoma in situ of left breast: Secondary | ICD-10-CM

## 2020-06-29 DIAGNOSIS — I10 Essential (primary) hypertension: Secondary | ICD-10-CM

## 2020-06-29 DIAGNOSIS — Z0000 Encounter for general adult medical examination without abnormal findings: Secondary | ICD-10-CM

## 2020-06-29 DIAGNOSIS — I421 Obstructive hypertrophic cardiomyopathy: Secondary | ICD-10-CM | POA: Diagnosis not present

## 2020-06-29 DIAGNOSIS — E785 Hyperlipidemia, unspecified: Secondary | ICD-10-CM

## 2020-06-29 DIAGNOSIS — I25118 Atherosclerotic heart disease of native coronary artery with other forms of angina pectoris: Secondary | ICD-10-CM | POA: Diagnosis not present

## 2020-06-29 DIAGNOSIS — F439 Reaction to severe stress, unspecified: Secondary | ICD-10-CM | POA: Diagnosis not present

## 2020-06-29 DIAGNOSIS — R69 Illness, unspecified: Secondary | ICD-10-CM | POA: Diagnosis not present

## 2020-06-29 DIAGNOSIS — R21 Rash and other nonspecific skin eruption: Secondary | ICD-10-CM | POA: Diagnosis not present

## 2020-06-29 NOTE — Progress Notes (Signed)
[name], female   DOB: 03-09-1956, 65 y.o.   MRN: [id number]   Subjective:    [name], female    DOB: Jun 11, 1956, 65 y.o.   MRN: [id number]  HPI This visit occurred during the SARS-CoV-2 public health emergency.  Safety protocols were in place, including screening questions prior to the visit, additional usage of staff PPE, and extensive cleaning of exam room while observing appropriate contact time as indicated for disinfecting solutions.  Patient here for her physical exam.  Recently diagnosed with DCIS left breast (s/p resection). Seeing Dr Bary Castilla.  S/p XRT.  She is concerned regarding some tenderness - left axilla.  On tamoxifen.  Tolerating.  Tries to stay active.  No chest pain or sob with increased activity or exertion.  Breathing stable.  No increased cough or congestion.  No acid reflux.  No abdominal pain or bowel change reported.  No dizziness now.  Had episode of what sounds like vertigo.  Described room spinning and N/V associated.  Lasted <1 day and resolved.  No further episodes.  No headache.  Discussed w/up and f/u.  Desired to follow. Persistent vaginal irritation - redness.  Some minimal bleeding today from scratching.   Past Medical History:  Diagnosis Date  . Chest pain    a. 10/2015 MV: abnl ECG w/ inf/antlat ST depression w/ exercise (in setting of LVH @ baseline). No ischemia/infarct.  Marland Kitchen Dyspnea    WITH EXERTION DUE TO ENLARGED HEART  . Family history of adverse reaction to anesthesia    SON-LUNGS FILLED UP WITH FLUID AFTER APPENDECTOMY  . Hematuria   . Hypercholesterolemia   . Hypertension   . Hypertrophic cardiomegaly    a. 10/2019 Echo: EF 70-75%, mod LVH. No rwma. Mild systolic anterior motion of the mitral valve with mild LVOT obstruction (peak gradient 20 mmHg at rest and 26 mmHg with Valsalva).  Normal RV systolic function.  Normal PASP.  Mildly dilated LA.  Marland Kitchen Systolic murmur    Past Surgical History:  Procedure  Laterality Date  . BREAST BIOPSY Left 10/27/2019   Affirm bx-distortion-     clip-path ADH  . BREAST BIOPSY Left 01/07/2020   Affim bx 1 area/ x clip and ribbon clip/ ADH  . BREAST LUMPECTOMY WITH NEEDLE LOCALIZATION Left 02/14/2020   Procedure: BREAST LUMPECTOMY WITH NEEDLE LOCALIZATION;  Surgeon: Robert Bellow, MD;  Location: [facility] ORS;  Service: General;  Laterality: Left;  . FACIAL FRACTURE SURGERY     X 2   Family History  Problem Relation Age of Onset  . Diabetes Mother   . Heart disease Mother   . Hypertension Mother   . Cancer Father        prostate   . Diverticulitis Father   . CVA Father   . Heart disease Father   . Stroke Father   . Hypertension Father   . Heart murmur Father   . Heart attack Brother   . CVA Paternal Grandmother   . Breast cancer Cousin    Social History   Socioeconomic History  . Marital status: Married    Spouse name: Not on file  . Number of children: 2  . Years of education: Not on file  . Highest education level: Not on file  Occupational History  . Not on file  Tobacco Use  . Smoking status: Never Smoker  . Smokeless tobacco: Never Used  Vaping Use  . Vaping Use: Never used  Substance and Sexual  Activity  . Alcohol use: No    Alcohol/week: 0.0 standard drinks  . Drug use: No  . Sexual activity: Not Currently  Other Topics Concern  . Not on file  Social History Narrative  . Not on file   Social Determinants of Health   Financial Resource Strain: Not on file  Food Insecurity: Not on file  Transportation Needs: Not on file  Physical Activity: Not on file  Stress: Not on file  Social Connections: Not on file    Outpatient Encounter Medications as of 06/29/2020  Medication Sig  . aspirin EC 81 MG EC tablet Take 1 tablet (81 mg total) by mouth daily.  Marland Kitchen atorvastatin (LIPITOR) 40 MG tablet Take 1 tablet (40 mg total) by mouth daily. (Patient taking differently: Take 40 mg by mouth every morning. )  . metoprolol succinate  (TOPROL XL) 25 MG 24 hr tablet Take 1 tablet (25 mg total) by mouth daily.  . Multiple Vitamin (MULTIVITAMIN) tablet Take 1 tablet by mouth daily.  . sertraline (ZOLOFT) 50 MG tablet Take 1 tablet by mouth once daily  . tamoxifen (NOLVADEX) 20 MG tablet Take 20 mg by mouth daily.   No facility-administered encounter medications on file as of 06/29/2020.    Review of Systems  Constitutional: Negative for appetite change and unexpected weight change.  HENT: Negative for congestion, sinus pressure and sore throat.   Eyes: Negative for pain and visual disturbance.  Respiratory: Negative for cough, chest tightness and shortness of breath.   Cardiovascular: Negative for chest pain, palpitations and leg swelling.  Gastrointestinal: Negative for abdominal pain, diarrhea, nausea and vomiting.  Genitourinary: Negative for difficulty urinating and dysuria.  Musculoskeletal: Negative for joint swelling and myalgias.  Skin: Negative for color change and rash.  Neurological: Negative for dizziness, light-headedness and headaches.  Hematological: Negative for adenopathy. Does not bruise/bleed easily.  Psychiatric/Behavioral: Negative for decreased concentration and dysphoric mood.       Objective:    Physical Exam Vitals reviewed.  Constitutional:      General: She is not in acute distress.    Appearance: Normal appearance.  HENT:     Head: Normocephalic and atraumatic.     Right Ear: External ear normal.     Left Ear: External ear normal.     Mouth/Throat:     Mouth: Oropharynx is clear and moist.  Eyes:     General: No scleral icterus.       Right eye: No discharge.        Left eye: No discharge.     Conjunctiva/sclera: Conjunctivae normal.  Neck:     Thyroid: No thyromegaly.  Cardiovascular:     Rate and Rhythm: Normal rate and regular rhythm.  Pulmonary:     Effort: No respiratory distress.     Breath sounds: Normal breath sounds. No wheezing.     Comments: Breasts:  No nipple  discharge or nipple retraction present.  Well healed incision sites left breast.  Minimal tenderness left axilla - no nodules or axillary adenopathy present. Right breast - no nodules or axillary adenopathy present.  Abdominal:     General: Bowel sounds are normal.     Palpations: Abdomen is soft.     Tenderness: There is no abdominal tenderness.  Genitourinary:    Comments: Erythema and swelling - outer labia.  Lesions - outer labia.  Could not appreciate any adnexal masses or tenderness.   Musculoskeletal:        General: No swelling, tenderness  or edema.     Cervical back: Neck supple. No tenderness.  Lymphadenopathy:     Cervical: No cervical adenopathy.  Skin:    Findings: No erythema or rash.  Neurological:     Mental Status: She is alert. Mental status is at baseline.  Psychiatric:        Mood and Affect: Mood normal.        Behavior: Behavior normal.     BP 130/70   Pulse 77   Temp 98.4 F (36.9 C) (Oral)   Resp 16   Ht 5\' 5"  (1.651 m)   Wt 171 lb (77.6 kg)   LMP 06/24/2010   SpO2 97%   BMI 28.46 kg/m  Wt Readings from Last 3 Encounters:  06/29/20 171 lb (77.6 kg)  05/25/20 168 lb (76.2 kg)  05/01/20 168 lb (76.2 kg)     Lab Results  Component Value Date   WBC 9.4 04/03/2020   HGB 13.9 04/03/2020   HCT 39.3 04/03/2020   PLT 200 04/03/2020   GLUCOSE 90 11/19/2019   CHOL 126 05/01/2020   TRIG 122 05/01/2020   HDL 44 05/01/2020   LDLDIRECT 57 01/19/2020   LDLCALC 60 05/01/2020   ALT 29 05/01/2020   AST 26 05/01/2020   NA 138 11/19/2019   K 3.7 11/19/2019   CL 106 11/19/2019   CREATININE 0.90 11/19/2019   BUN 15 11/19/2019   CO2 23 11/19/2019   TSH 1.63 04/20/2019   INR 1.0 11/19/2019   HGBA1C 5.2 03/30/2013    DG BONE DENSITY (DXA)  Result Date: 06/01/2020 EXAM: DUAL X-RAY ABSORPTIOMETRY (DXA) FOR BONE MINERAL DENSITY IMPRESSION: Your patient Mary Walter completed a BMD test on 06/01/2020 using the [place] (software version:  14.10) manufactured by [employer]. The following summarizes the results of our evaluation. Technologist: ecj PATIENT BIOGRAPHICAL: Name: Mary, Walter Patient ID:  [id number] Birth Date: Sep 27, 1955                       Height:     65.0 in. Gender:      Female Exam Date:  06/01/2020                       Weight:     169.7 lbs. Indications: Breast CA, Caucasian, History of Breast Cancer, History of Radiation, History of Fracture (Adult) Fractures: Facial Bone, Left toe, Right toe Treatments: Multi-Vitamin DENSITOMETRY RESULTS: Site      Region     Measured Date Measured Age WHO Classification Young Adult T-score BMD %Change vs. Previous Significant Change (*) AP Spine L1-L4 06/01/2020 65.5 Osteopenia -1.9 0.965 g/cm2 - - DualFemur Neck Right 06/01/2020 65.5 Osteopenia -1.3 0.864 g/cm2 - - ASSESSMENT: The BMD measured at AP Spine L1-L4 is 0.965 g/cm2 with a T-score of -1.9. This patient is considered OSTEOPENIC according to [facility]) criteria. The scan quality is good. World Pharmacologist [facility]) criteria for post-menopausal, Caucasian Women: Normal:                   T-score at or above -1 SD Osteopenia/low bone mass: T-score between -1 and -2.5 SD Osteoporosis:             T-score at or below -2.5 SD RECOMMENDATIONS: 1. All patients should optimize calcium and vitamin D intake. 2. Consider FDA-approved medical therapies in postmenopausal women and men aged 31 years and older, based on the following:  a. A hip or vertebral(clinical or morphometric) fracture b. T-score < -2.5 at the femoral neck or spine after appropriate evaluation to exclude secondary causes c. Low bone mass (T-score between -1.0 and -2.5 at the femoral neck or spine) and a 10-year probability of a hip fracture > 3% or a 10-year probability of a major osteoporosis-related fracture > 20% based on the [place]-adapted WHO algorithm 3. Clinician judgment and/or patient preferences may indicate treatment for people  with 10-year fracture probabilities above or below these levels FOLLOW-UP: People with diagnosed cases of osteoporosis or at high risk for fracture should have regular bone mineral density tests. For patients eligible for Medicare, routine testing is allowed once every 2 years. The testing frequency can be increased to one year for patients who have rapidly progressing disease, those who are receiving or discontinuing medical therapy to restore bone mass, or have additional risk factors. I have reviewed this report, and agree with the above findings. Mark A. Thornton Papas, M.D. [facility] Dear Dr Bary Castilla, Your patient Mary Walter completed a FRAX assessment on 06/01/2020 using the [place] (analysis version: 14.10) manufactured by [employer]. The following summarizes the results of our evaluation. PATIENT BIOGRAPHICAL: Name: Mary, Walter Patient ID: [id number] Birth Date: 11/18/1955 Height:    65.0 in. Gender:     Female    [age]       Weight:    169.7 lbs. Ethnicity:  White                            Exam Date: 06/01/2020 FRAX* RESULTS:  (version: 3.5) 10-year Probability of Fracture1 Major Osteoporotic Fracture2 Hip Fracture 13.8% 1.2% Population: [place] (Caucasian) Risk Factors: History of Fracture (Adult) Based on Femur (Right) Neck BMD 1 -The 10-year probability of fracture may be lower than reported if the patient has received treatment. 2 -Major Osteoporotic Fracture: Clinical Spine, Forearm, Hip or Shoulder *FRAX is a Materials engineer of the [employer] for Metabolic Bone Disease, a [place] (WHO) [facility]. ASSESSMENT: The probability of a major osteoporotic fracture is 13.8% within the next ten years. The probability of a hip fracture is 1.2% within the next ten years. I have reviewed this report and agree with the above findings. Mark A. Thornton Papas, M.D. [facility] Electronically Signed   By: Lavonia Dana M.D.   On: 06/01/2020 09:41       Assessment & Plan:   Problem List Items Addressed This Visit    Hyperlipidemia    Low cholesterol diet and exercise.  Continue lipitor.  LDL 05/01/20 - 60.  Follow lipid panel.       Relevant Orders   Lipid panel   Hepatic function panel   Essential hypertension    Blood pressure doing well.  On amlodipine.  Follow pressures.        Relevant Orders   TSH   Basic metabolic panel   Stress    On zoloft.  Discussed.  Appears to be handling things relatively well.  Follow.       Health care maintenance    Physical today 06/29/20.  PAP 04/20/19.  Colonoscopy 11/2016 - few smeall polyps and internal hemorrhoids.  Recommended f/u colonoscopy in 10 years.  Being followed by Dr Bary Castilla for breast f/u.       Rash of genitalia    Viral culture obtained.  She saw gyn.  Has never resolved.  Refer back to gyn for reevaluation.       Relevant Orders   Herpes simplex virus culture (Completed)   Obstructive hypertrophic cardiomyopathy (HCC)    Stable on metoprolol.  Followed by cardiology.       Coronary artery disease of native artery of native heart with stable angina pectoris ([place])    Currently stable on current medication regimen.  Continue metoprolol and atorvastatin.        DCIS (ductal carcinoma in situ)    S/p excision.  Seeing Dr Bary Castilla.  Soreness left axilla.  No abnormality noted on exam.  Per pt request, notify Dr Bary Castilla.       Relevant Medications   tamoxifen (NOLVADEX) 20 MG tablet    Other Visit Diagnoses    Routine general medical examination at a health care facility    -  Primary       Einar Pheasant, MD

## 2020-07-01 ENCOUNTER — Encounter: Payer: Self-pay | Admitting: Internal Medicine

## 2020-07-01 DIAGNOSIS — I421 Obstructive hypertrophic cardiomyopathy: Secondary | ICD-10-CM | POA: Insufficient documentation

## 2020-07-01 DIAGNOSIS — D0510 Intraductal carcinoma in situ of unspecified breast: Secondary | ICD-10-CM | POA: Insufficient documentation

## 2020-07-01 DIAGNOSIS — I25118 Atherosclerotic heart disease of native coronary artery with other forms of angina pectoris: Secondary | ICD-10-CM | POA: Insufficient documentation

## 2020-07-01 NOTE — Assessment & Plan Note (Signed)
Viral culture obtained.  She saw gyn.  Has never resolved.  Refer back to gyn for reevaluation.

## 2020-07-01 NOTE — Assessment & Plan Note (Signed)
S/p excision.  Seeing Dr Bary Castilla.  Soreness left axilla.  No abnormality noted on exam.  Per pt request, notify Dr Bary Castilla.

## 2020-07-01 NOTE — Assessment & Plan Note (Signed)
Blood pressure doing well.  On amlodipine.  Follow pressures.

## 2020-07-01 NOTE — Assessment & Plan Note (Signed)
On zoloft.  Discussed.  Appears to be handling things relatively well.  Follow.

## 2020-07-01 NOTE — Assessment & Plan Note (Signed)
Currently stable on current medication regimen.  Continue metoprolol and atorvastatin.

## 2020-07-01 NOTE — Assessment & Plan Note (Signed)
Low cholesterol diet and exercise.  Continue lipitor.  LDL 05/01/20 - 60.  Follow lipid panel.

## 2020-07-01 NOTE — Assessment & Plan Note (Signed)
Stable on metoprolol.  Followed by cardiology.

## 2020-07-01 NOTE — Assessment & Plan Note (Addendum)
Physical today 06/29/20.  PAP 04/20/19.  Colonoscopy 11/2016 - few smeall polyps and internal hemorrhoids.  Recommended f/u colonoscopy in 10 years.  Being followed by Dr Bary Castilla for breast f/u.

## 2020-07-02 LAB — HERPES SIMPLEX VIRUS CULTURE

## 2020-07-17 ENCOUNTER — Encounter: Payer: Self-pay | Admitting: Internal Medicine

## 2020-07-17 ENCOUNTER — Telehealth: Payer: Self-pay | Admitting: Cardiovascular Disease

## 2020-07-17 MED ORDER — ATORVASTATIN CALCIUM 40 MG PO TABS
40.0000 mg | ORAL_TABLET | Freq: Every day | ORAL | 2 refills | Status: DC
Start: 2020-07-17 — End: 2021-04-04

## 2020-07-17 NOTE — Telephone Encounter (Signed)
error 

## 2020-07-17 NOTE — Telephone Encounter (Signed)
Requested Prescriptions   Signed Prescriptions Disp Refills   atorvastatin (LIPITOR) 40 MG tablet 90 tablet 2    Sig: Take 1 tablet (40 mg total) by mouth daily.    Authorizing Provider: Minna Merritts    Ordering User: Raelene Bott, Sulay Brymer L

## 2020-07-17 NOTE — Telephone Encounter (Signed)
*  STAT* If patient is at the pharmacy, call can be transferred to refill team.   1. Which medications need to be refilled? (please list name of each medication and dose if known) Atorvastatin (LIPITOR) 40 MG  2. Which pharmacy/location (including street and city if local pharmacy) is medication to be sent to? [facility] on [address]  3. Do they need a 30 day or 90 day supply? 90 day

## 2020-08-08 ENCOUNTER — Ambulatory Visit
Admission: RE | Admit: 2020-08-08 | Discharge: 2020-08-08 | Disposition: A | Discharge status: Home / Self Care | Payer: Medicare HMO | Source: Ambulatory Visit | Attending: General Surgery | Admitting: General Surgery

## 2020-08-08 ENCOUNTER — Other Ambulatory Visit: Payer: Self-pay

## 2020-08-08 DIAGNOSIS — D0512 Intraductal carcinoma in situ of left breast: Secondary | ICD-10-CM | POA: Diagnosis not present

## 2020-08-08 DIAGNOSIS — R922 Inconclusive mammogram: Secondary | ICD-10-CM | POA: Diagnosis not present

## 2020-08-08 HISTORY — DX: Personal history of irradiation: Z92.3

## 2020-08-08 HISTORY — DX: Malignant neoplasm of unspecified site of unspecified female breast: C50.919

## 2020-08-16 DIAGNOSIS — Z20822 Contact with and (suspected) exposure to covid-19: Secondary | ICD-10-CM | POA: Diagnosis not present

## 2020-09-12 DIAGNOSIS — D0512 Intraductal carcinoma in situ of left breast: Secondary | ICD-10-CM | POA: Diagnosis not present

## 2020-09-20 ENCOUNTER — Other Ambulatory Visit: Payer: Self-pay | Admitting: Internal Medicine

## 2020-09-27 ENCOUNTER — Other Ambulatory Visit: Payer: Medicare HMO

## 2020-09-29 ENCOUNTER — Other Ambulatory Visit (INDEPENDENT_AMBULATORY_CARE_PROVIDER_SITE_OTHER): Payer: Medicare HMO

## 2020-09-29 ENCOUNTER — Other Ambulatory Visit: Payer: Self-pay

## 2020-09-29 DIAGNOSIS — E785 Hyperlipidemia, unspecified: Secondary | ICD-10-CM | POA: Diagnosis not present

## 2020-09-29 DIAGNOSIS — I10 Essential (primary) hypertension: Secondary | ICD-10-CM

## 2020-09-29 LAB — LIPID PANEL
Cholesterol: 107 mg/dL (ref 0–200)
HDL: 36.4 mg/dL — ABNORMAL LOW (ref >39.00)
LDL Cholesterol: 54 mg/dL (ref 0–99)
NonHDL: 70.14
Total CHOL/HDL Ratio: 3
Triglycerides: 83 mg/dL (ref 0.0–149.0)
VLDL: 16.6 mg/dL (ref 0.0–40.0)

## 2020-09-29 LAB — HEPATIC FUNCTION PANEL
ALT: 32 U/L (ref 0–35)
AST: 25 U/L (ref 0–37)
Albumin: 3.9 g/dL (ref 3.5–5.2)
Alkaline Phosphatase: 60 U/L (ref 39–117)
Bilirubin, Direct: 0.2 mg/dL (ref 0.0–0.3)
Total Bilirubin: 0.7 mg/dL (ref 0.2–1.2)
Total Protein: 6.2 g/dL (ref 6.0–8.3)

## 2020-09-29 LAB — TSH: TSH: 1.6 u[IU]/mL (ref 0.35–4.50)

## 2020-09-29 LAB — BASIC METABOLIC PANEL
BUN: 18 mg/dL (ref 6–23)
CO2: 26 mEq/L (ref 19–32)
Calcium: 10.4 mg/dL (ref 8.4–10.5)
Chloride: 108 mEq/L (ref 96–112)
Creatinine, Ser: 0.92 mg/dL (ref 0.40–1.20)
GFR: 65.18 mL/min (ref >60.00)
Glucose, Bld: 82 mg/dL (ref 70–99)
Potassium: 3.9 mEq/L (ref 3.5–5.1)
Sodium: 142 mEq/L (ref 135–145)

## 2020-10-04 DIAGNOSIS — Z20822 Contact with and (suspected) exposure to covid-19: Secondary | ICD-10-CM | POA: Diagnosis not present

## 2020-10-22 HISTORY — PX: CHOLECYSTECTOMY: SHX55

## 2020-10-25 ENCOUNTER — Ambulatory Visit
Admission: RE | Admit: 2020-10-25 | Discharge: 2020-10-25 | Disposition: A | Discharge status: Home / Self Care | Payer: Medicare HMO | Source: Ambulatory Visit | Attending: Radiation Oncology | Admitting: Radiation Oncology

## 2020-10-25 ENCOUNTER — Encounter: Payer: Self-pay | Admitting: Radiation Oncology

## 2020-10-25 ENCOUNTER — Other Ambulatory Visit: Payer: Self-pay | Admitting: Adult Health

## 2020-10-25 ENCOUNTER — Other Ambulatory Visit: Payer: Self-pay

## 2020-10-25 VITALS — BP 134/75 | HR 93 | Temp 97.4°F | Resp 16 | Wt 167.0 lb

## 2020-10-25 DIAGNOSIS — D0512 Intraductal carcinoma in situ of left breast: Secondary | ICD-10-CM

## 2020-10-25 DIAGNOSIS — Z923 Personal history of irradiation: Secondary | ICD-10-CM | POA: Insufficient documentation

## 2020-10-25 DIAGNOSIS — Z79810 Long term (current) use of selective estrogen receptor modulators (SERMs): Secondary | ICD-10-CM | POA: Insufficient documentation

## 2020-10-25 DIAGNOSIS — Z170 Estrogen receptor positive status [ER+]: Secondary | ICD-10-CM | POA: Insufficient documentation

## 2020-10-25 DIAGNOSIS — D0511 Intraductal carcinoma in situ of right breast: Secondary | ICD-10-CM | POA: Insufficient documentation

## 2020-10-25 NOTE — Progress Notes (Signed)
Radiation Oncology Follow up Note  Name: Mary Walter   Date:   10/25/2020 MRN:  [id number] DOB: August 09, 1955    This 65 y.o. female presents to the clinic today for 53-month follow-up status post whole breast radiation to her right breast for ductal carcinoma in situ.  Tumor is ER positive.  REFERRING PROVIDER: Einar Pheasant, MD  HPI: Patient is a 65 year old female now out 6 months having completed whole breast radiation to her right breast for ER positive ductal carcinoma in situ.  Seen today in routine follow-up she is doing well.  Still has occasional tenderness in that breast consistent with healing scar tissue.  She specifically denies cough or bone pain..  She had mammograms back in February which I have reviewed were BI-RADS 2 benign.  She is currently on tamoxifen tolerating it well without side effect.  COMPLICATIONS OF TREATMENT: none  FOLLOW UP COMPLIANCE: keeps appointments   PHYSICAL EXAM:  BP 134/75 (BP Location: Left Arm, Patient Position: Sitting)   Pulse 93   Temp (!) 97.4 F (36.3 C) (Tympanic)   Resp 16   Wt 167 lb (75.8 kg)   LMP 06/24/2010   SpO2 98%   BMI 27.79 kg/m  Lungs are clear to A&P cardiac examination essentially unremarkable with regular rate and rhythm. No dominant mass or nodularity is noted in either breast in 2 positions examined. Incision is well-healed. No axillary or supraclavicular adenopathy is appreciated. Cosmetic result is excellent.  Well-developed well-nourished patient in NAD. HEENT reveals PERLA, EOMI, discs not visualized.  Oral cavity is clear. No oral mucosal lesions are identified. Neck is clear without evidence of cervical or supraclavicular adenopathy. Lungs are clear to A&P. Cardiac examination is essentially unremarkable with regular rate and rhythm without murmur rub or thrill. Abdomen is benign with no organomegaly or masses noted. Motor sensory and DTR levels are equal and symmetric in the upper and lower extremities. Cranial  nerves II through XII are grossly intact. Proprioception is intact. No peripheral adenopathy or edema is identified. No motor or sensory levels are noted. Crude visual fields are within normal range.  RADIOLOGY RESULTS: Mammograms reviewed compatible with above-stated findings  PLAN: Present time patient is doing well 6 months out from whole breast radiation and pleased with her overall progress.  I have asked to see her back in 6 months and then will start once year follow-up visits.  She continues close follow-up care with Dr. Tollie Pizza.  Patient knows to call with any concerns.  She continues on tamoxifen without side effect.  I would like to take this opportunity to thank you for allowing me to participate in the care of your patient.Noreene Filbert, MD

## 2020-10-25 NOTE — Progress Notes (Signed)
Survivorship Care Plan visit completed.  Treatment summary reviewed and given to patient.  ASCO answers booklet reviewed and given to patient.  CARE program and Cancer Transitions discussed with patient along with other resources cancer center offers to patients and caregivers.  Patient verbalized understanding.    

## 2020-10-25 NOTE — Progress Notes (Signed)
Error

## 2020-10-27 NOTE — Addendum Note (Signed)
Encounter addended by: Daiva Huge, RN on: 10/27/2020 8:30 AM  Actions taken: Visit diagnoses modified, Clinical Note Signed

## 2020-10-27 NOTE — Progress Notes (Signed)
Patient's original diagnosis was miscoded as right breast.  My notes are incorrect this was left breast treatment.Mary Walter

## 2020-10-27 NOTE — Addendum Note (Signed)
Encounter addended by: Daiva Huge, RN on: 10/27/2020 8:39 AM  Actions taken: Clinical Note Signed

## 2020-10-27 NOTE — Addendum Note (Signed)
Encounter addended by: Daiva Huge, RN on: 10/27/2020 10:03 AM  Actions taken: Visit diagnoses modified

## 2020-10-27 NOTE — Addendum Note (Signed)
Encounter addended by: Daiva Huge, RN on: 10/27/2020 8:28 AM  Actions taken: Visit diagnoses modified

## 2020-10-31 ENCOUNTER — Encounter: Payer: Self-pay | Admitting: Internal Medicine

## 2020-10-31 ENCOUNTER — Other Ambulatory Visit: Payer: Self-pay

## 2020-10-31 ENCOUNTER — Ambulatory Visit (INDEPENDENT_AMBULATORY_CARE_PROVIDER_SITE_OTHER): Payer: Medicare HMO | Admitting: Internal Medicine

## 2020-10-31 VITALS — BP 118/70 | HR 77 | Temp 97.9°F | Resp 16 | Ht 65.0 in | Wt 166.0 lb

## 2020-10-31 DIAGNOSIS — I10 Essential (primary) hypertension: Secondary | ICD-10-CM

## 2020-10-31 DIAGNOSIS — I421 Obstructive hypertrophic cardiomyopathy: Secondary | ICD-10-CM

## 2020-10-31 DIAGNOSIS — R319 Hematuria, unspecified: Secondary | ICD-10-CM

## 2020-10-31 DIAGNOSIS — F439 Reaction to severe stress, unspecified: Secondary | ICD-10-CM

## 2020-10-31 DIAGNOSIS — R0602 Shortness of breath: Secondary | ICD-10-CM | POA: Diagnosis not present

## 2020-10-31 DIAGNOSIS — E785 Hyperlipidemia, unspecified: Secondary | ICD-10-CM

## 2020-10-31 DIAGNOSIS — I25118 Atherosclerotic heart disease of native coronary artery with other forms of angina pectoris: Secondary | ICD-10-CM | POA: Diagnosis not present

## 2020-10-31 DIAGNOSIS — R69 Illness, unspecified: Secondary | ICD-10-CM | POA: Diagnosis not present

## 2020-10-31 DIAGNOSIS — D0512 Intraductal carcinoma in situ of left breast: Secondary | ICD-10-CM

## 2020-10-31 NOTE — Progress Notes (Signed)
Patient ID: AUSTEN WYGANT, female   DOB: Nov 07, 1955, 65 y.o.   MRN: [id number]   Subjective:    Patient ID: TASHEIKA KITZMILLER, female    DOB: 01-08-1956, 65 y.o.   MRN: [id number]  HPI This visit occurred during the SARS-CoV-2 public health emergency.  Safety protocols were in place, including screening questions prior to the visit, additional usage of staff PPE, and extensive cleaning of exam room while observing appropriate contact time as indicated for disinfecting solutions.  Patient here for a scheduled follow up.  Here to follow up regarding her cholesterol, blood pressure, DCIS and sob.  She is s/p left lumpectomy - excision of low grade DCIS.  S/p XRT.  Some fatigue related.  Saw Dr Bary Castilla 09/12/20.  Stable.  Recommended f/u in 11 months.  Received covid booster 07/2020.  States one week later - diagnosed with covid.  Reports no significant residual problems.  She does report that she has noticed some intermittent left arm pressure and some chest tightness.  Also has noticed some increased sob when going up an down stairs.  Previous cardiac w/up - EF 70-75% with moderate LVH.  Calcium score - 182.  Incidental finding on chest CT - 58mm pulmonary nodule.  No increased cough or congestion.  No acid reflux reported.  No abdominal pain or bowel change reported.     Past Medical History:  Diagnosis Date  . Breast cancer ([place])   . Chest pain    a. 10/2015 MV: abnl ECG w/ inf/antlat ST depression w/ exercise (in setting of LVH @ baseline). No ischemia/infarct.  Marland Kitchen Dyspnea    WITH EXERTION DUE TO ENLARGED HEART  . Family history of adverse reaction to anesthesia    SON-LUNGS FILLED UP WITH FLUID AFTER APPENDECTOMY  . Hematuria   . Hypercholesterolemia   . Hypertension   . Hypertrophic cardiomegaly    a. 10/2019 Echo: EF 70-75%, mod LVH. No rwma. Mild systolic anterior motion of the mitral valve with mild LVOT obstruction (peak gradient 20 mmHg at rest and 26 mmHg with Valsalva).  Normal RV  systolic function.  Normal PASP.  Mildly dilated LA.  Marland Kitchen Personal history of radiation therapy   . Systolic murmur    Past Surgical History:  Procedure Laterality Date  . BREAST BIOPSY Left 10/27/2019   Affirm bx-distortion-     clip-path ADH  . BREAST BIOPSY Left 01/07/2020   Affim bx 1 area/ x clip and ribbon clip/ ADH  . BREAST LUMPECTOMY    . BREAST LUMPECTOMY WITH NEEDLE LOCALIZATION Left 02/14/2020   Procedure: BREAST LUMPECTOMY WITH NEEDLE LOCALIZATION;  Surgeon: Robert Bellow, MD;  Location: [facility] ORS;  Service: General;  Laterality: Left;  . FACIAL FRACTURE SURGERY     X 2   Family History  Problem Relation Age of Onset  . Diabetes Mother   . Heart disease Mother   . Hypertension Mother   . Cancer Father        prostate   . Diverticulitis Father   . CVA Father   . Heart disease Father   . Stroke Father   . Hypertension Father   . Heart murmur Father   . Heart attack Brother   . CVA Paternal Grandmother   . Breast cancer Cousin    Social History   Socioeconomic History  . Marital status: Married    Spouse name: Not on file  . Number of children: 2  . Years of education: Not on file  .  Highest education level: Not on file  Occupational History  . Not on file  Tobacco Use  . Smoking status: Never Smoker  . Smokeless tobacco: Never Used  Vaping Use  . Vaping Use: Never used  Substance and Sexual Activity  . Alcohol use: No    Alcohol/week: 0.0 standard drinks  . Drug use: No  . Sexual activity: Not Currently  Other Topics Concern  . Not on file  Social History Narrative  . Not on file   Social Determinants of Health   Financial Resource Strain: Not on file  Food Insecurity: Not on file  Transportation Needs: Not on file  Physical Activity: Not on file  Stress: Not on file  Social Connections: Not on file    Outpatient Encounter Medications as of 10/31/2020  Medication Sig  . aspirin EC 81 MG EC tablet Take 1 tablet (81 mg total) by mouth  daily.  Marland Kitchen atorvastatin (LIPITOR) 40 MG tablet Take 1 tablet (40 mg total) by mouth daily.  . metoprolol succinate (TOPROL XL) 25 MG 24 hr tablet Take 1 tablet (25 mg total) by mouth daily.  . Multiple Vitamin (MULTIVITAMIN) tablet Take 1 tablet by mouth daily.  . sertraline (ZOLOFT) 50 MG tablet Take 1 tablet by mouth once daily  . tamoxifen (NOLVADEX) 20 MG tablet Take 20 mg by mouth daily.  . Vitamin D, Cholecalciferol, 10 MCG (400 UNIT) CAPS Take by mouth.  . [DISCONTINUED] Vitamin D, Cholecalciferol, 10 MCG (400 UNIT) CAPS Take by mouth.   No facility-administered encounter medications on file as of 10/31/2020.    Review of Systems  Constitutional: Negative for appetite change and unexpected weight change.  HENT: Negative for congestion and sinus pressure.   Respiratory: Negative for cough and chest tightness.        SOB with exertion.   Cardiovascular: Negative for chest pain, palpitations and leg swelling.  Gastrointestinal: Negative for abdominal pain, diarrhea, nausea and vomiting.  Genitourinary: Negative for difficulty urinating and dysuria.  Musculoskeletal: Negative for joint swelling and myalgias.  Skin: Negative for color change and rash.  Neurological: Negative for dizziness, light-headedness and headaches.  Psychiatric/Behavioral: Negative for agitation and dysphoric mood.       Objective:    Physical Exam Vitals reviewed.  Constitutional:      General: She is not in acute distress.    Appearance: Normal appearance.  HENT:     Head: Normocephalic and atraumatic.     Right Ear: External ear normal.     Left Ear: External ear normal.  Eyes:     General: No scleral icterus.       Right eye: No discharge.        Left eye: No discharge.     Conjunctiva/sclera: Conjunctivae normal.  Neck:     Thyroid: No thyromegaly.  Cardiovascular:     Rate and Rhythm: Normal rate and regular rhythm.     Comments: 2/6 systolic murmur Pulmonary:     Effort: No respiratory  distress.     Breath sounds: Normal breath sounds. No wheezing.  Abdominal:     General: Bowel sounds are normal.     Palpations: Abdomen is soft.     Tenderness: There is no abdominal tenderness.  Musculoskeletal:        General: No swelling or tenderness.     Cervical back: Neck supple. No tenderness.  Lymphadenopathy:     Cervical: No cervical adenopathy.  Skin:    Findings: No erythema or rash.  Neurological:     Mental Status: She is alert.  Psychiatric:        Mood and Affect: Mood normal.        Behavior: Behavior normal.     BP 118/70   Pulse 77   Temp 97.9 F (36.6 C) (Temporal)   Resp 16   Ht 5\' 5"  (1.651 m)   Wt 166 lb (75.3 kg)   LMP 06/24/2010   SpO2 98%   BMI 27.62 kg/m  Wt Readings from Last 3 Encounters:  10/31/20 166 lb (75.3 kg)  10/25/20 167 lb (75.8 kg)  06/29/20 171 lb (77.6 kg)     Lab Results  Component Value Date   WBC 9.4 04/03/2020   HGB 13.9 04/03/2020   HCT 39.3 04/03/2020   PLT 200 04/03/2020   GLUCOSE 82 09/29/2020   CHOL 107 09/29/2020   TRIG 83.0 09/29/2020   HDL 36.40 (L) 09/29/2020   LDLDIRECT 57 01/19/2020   LDLCALC 54 09/29/2020   ALT 32 09/29/2020   AST 25 09/29/2020   NA 142 09/29/2020   K 3.9 09/29/2020   CL 108 09/29/2020   CREATININE 0.92 09/29/2020   BUN 18 09/29/2020   CO2 26 09/29/2020   TSH 1.60 09/29/2020   INR 1.0 11/19/2019   HGBA1C 5.2 03/30/2013       Assessment & Plan:   Problem List Items Addressed This Visit    Coronary artery disease of native artery of native heart with stable angina pectoris ([place])    Continues on metoprolol and lipitor.  Given intermittent symptoms of chest tightness, left arm pressure and sob with exertion, EKG - SR, RBBB with ant/lateral ST depression and TWI.  No significant change when compared to the previous check.  No pain or acute symptoms currently.  Given history and symptoms, refer back to cardiology for further evaluation.  Continue metoprolol and lipitor.         DCIS (ductal carcinoma in situ)    S/p excision.  Followed by Dr Bary Castilla.  S/p XRT.  Last evaluated 08/2020.  Stable.  Recommended f/u in 11 months.       Essential hypertension    Continue metoprolol.  Blood pressure as outlined.  No changes in medication.  Follow pressures.  Follow metabolic panel.       Relevant Orders   Basic metabolic panel   Hematuria    Previously evaluated by Dr Yves Dill.  Has wanted to hold on f/u.  Recheck urine with next labs.       Relevant Orders   Urinalysis, Routine w reflex microscopic   Hyperlipidemia    Low cholesterol diet and exercise.  Continue lipitor.  Follow lipid panel and liver function tests.        Relevant Orders   Hepatic function panel   Lipid panel   Obstructive hypertrophic cardiomyopathy ([place])    On metoprolol.  Previous echo as outlined.  Has noticed some sob with exertion and intermittent chest pressure.  Will have cardiology reevaluate.        Shortness of breath - Primary    Noticed sob with exertion.  EKG as outlined.  Further cardiac evaluation as outlined - refer.       Relevant Orders   EKG 12-Lead (Completed)   Stress    Continues on zoloft.  Stable.            Einar Pheasant, MD

## 2020-11-06 ENCOUNTER — Encounter: Payer: Self-pay | Admitting: Internal Medicine

## 2020-11-06 NOTE — Assessment & Plan Note (Signed)
On metoprolol.  Previous echo as outlined.  Has noticed some sob with exertion and intermittent chest pressure.  Will have cardiology reevaluate.

## 2020-11-06 NOTE — Assessment & Plan Note (Signed)
Continues on zoloft.  Stable.  

## 2020-11-06 NOTE — Assessment & Plan Note (Signed)
S/p excision.  Followed by Dr Bary Castilla.  S/p XRT.  Last evaluated 08/2020.  Stable.  Recommended f/u in 11 months.

## 2020-11-06 NOTE — Assessment & Plan Note (Signed)
Continue metoprolol.  Blood pressure as outlined.  No changes in medication.  Follow pressures.  Follow metabolic panel.  

## 2020-11-06 NOTE — Assessment & Plan Note (Signed)
Continues on metoprolol and lipitor.  Given intermittent symptoms of chest tightness, left arm pressure and sob with exertion, EKG - SR, RBBB with ant/lateral ST depression and TWI.  No significant change when compared to the previous check.  No pain or acute symptoms currently.  Given history and symptoms, refer back to cardiology for further evaluation.  Continue metoprolol and lipitor.

## 2020-11-06 NOTE — Assessment & Plan Note (Signed)
Previously evaluated by Dr Yves Dill.  Has wanted to hold on f/u.  Recheck urine with next labs.

## 2020-11-06 NOTE — Assessment & Plan Note (Signed)
Noticed sob with exertion.  EKG as outlined.  Further cardiac evaluation as outlined - refer.

## 2020-11-06 NOTE — Assessment & Plan Note (Signed)
Low cholesterol diet and exercise.  Continue lipitor. Follow lipid panel and liver function tests.   

## 2020-11-07 ENCOUNTER — Emergency Department: Payer: Medicare HMO

## 2020-11-07 ENCOUNTER — Other Ambulatory Visit: Payer: Self-pay

## 2020-11-07 ENCOUNTER — Observation Stay
Admission: EM | Admit: 2020-11-07 | Discharge: 2020-11-09 | Disposition: A | Discharge status: Home / Self Care | Payer: Medicare HMO | Attending: Surgery | Admitting: Surgery

## 2020-11-07 DIAGNOSIS — Z853 Personal history of malignant neoplasm of breast: Secondary | ICD-10-CM | POA: Insufficient documentation

## 2020-11-07 DIAGNOSIS — I10 Essential (primary) hypertension: Secondary | ICD-10-CM | POA: Diagnosis not present

## 2020-11-07 DIAGNOSIS — R1013 Epigastric pain: Secondary | ICD-10-CM | POA: Diagnosis not present

## 2020-11-07 DIAGNOSIS — Z01818 Encounter for other preprocedural examination: Secondary | ICD-10-CM

## 2020-11-07 DIAGNOSIS — Z79899 Other long term (current) drug therapy: Secondary | ICD-10-CM | POA: Insufficient documentation

## 2020-11-07 DIAGNOSIS — R1011 Right upper quadrant pain: Secondary | ICD-10-CM | POA: Diagnosis not present

## 2020-11-07 DIAGNOSIS — K819 Cholecystitis, unspecified: Secondary | ICD-10-CM

## 2020-11-07 DIAGNOSIS — R109 Unspecified abdominal pain: Secondary | ICD-10-CM | POA: Diagnosis not present

## 2020-11-07 DIAGNOSIS — K810 Acute cholecystitis: Secondary | ICD-10-CM | POA: Diagnosis present

## 2020-11-07 DIAGNOSIS — K8020 Calculus of gallbladder without cholecystitis without obstruction: Secondary | ICD-10-CM | POA: Diagnosis not present

## 2020-11-07 DIAGNOSIS — K8012 Calculus of gallbladder with acute and chronic cholecystitis without obstruction: Principal | ICD-10-CM | POA: Insufficient documentation

## 2020-11-07 DIAGNOSIS — Z20822 Contact with and (suspected) exposure to covid-19: Secondary | ICD-10-CM | POA: Insufficient documentation

## 2020-11-07 DIAGNOSIS — Z7982 Long term (current) use of aspirin: Secondary | ICD-10-CM | POA: Diagnosis not present

## 2020-11-07 DIAGNOSIS — I2510 Atherosclerotic heart disease of native coronary artery without angina pectoris: Secondary | ICD-10-CM

## 2020-11-07 DIAGNOSIS — K7689 Other specified diseases of liver: Secondary | ICD-10-CM | POA: Diagnosis not present

## 2020-11-07 LAB — HEPATIC FUNCTION PANEL
ALT: 71 U/L — ABNORMAL HIGH (ref 0–44)
AST: 78 U/L — ABNORMAL HIGH (ref 15–41)
Albumin: 3.9 g/dL (ref 3.5–5.0)
Alkaline Phosphatase: 67 U/L (ref 38–126)
Bilirubin, Direct: 0.5 mg/dL — ABNORMAL HIGH (ref 0.0–0.2)
Indirect Bilirubin: 0.9 mg/dL (ref 0.3–0.9)
Total Bilirubin: 1.4 mg/dL — ABNORMAL HIGH (ref 0.3–1.2)
Total Protein: 6.4 g/dL — ABNORMAL LOW (ref 6.5–8.1)

## 2020-11-07 LAB — BASIC METABOLIC PANEL
Anion gap: 5 (ref 5–15)
BUN: 15 mg/dL (ref 8–23)
CO2: 26 mmol/L (ref 22–32)
Calcium: 9.9 mg/dL (ref 8.9–10.3)
Chloride: 109 mmol/L (ref 98–111)
Creatinine, Ser: 0.81 mg/dL (ref 0.44–1.00)
GFR, Estimated: >60 mL/min (ref >60)
Glucose, Bld: 112 mg/dL — ABNORMAL HIGH (ref 70–99)
Potassium: 3.8 mmol/L (ref 3.5–5.1)
Sodium: 140 mmol/L (ref 135–145)

## 2020-11-07 LAB — CBC
HCT: 39.9 % (ref 36.0–46.0)
Hemoglobin: 13.6 g/dL (ref 12.0–15.0)
MCH: 30.5 pg (ref 26.0–34.0)
MCHC: 34.1 g/dL (ref 30.0–36.0)
MCV: 89.5 fL (ref 80.0–100.0)
Platelets: 142 10*3/uL — ABNORMAL LOW (ref 150–400)
RBC: 4.46 MIL/uL (ref 3.87–5.11)
RDW: 13.5 % (ref 11.5–15.5)
WBC: 11.6 10*3/uL — ABNORMAL HIGH (ref 4.0–10.5)
nRBC: 0 % (ref 0.0–0.2)

## 2020-11-07 LAB — RESP PANEL BY RT-PCR (FLU A&B, COVID) ARPGX2
Influenza A by PCR: NEGATIVE
Influenza B by PCR: NEGATIVE
SARS Coronavirus 2 by RT PCR: NEGATIVE

## 2020-11-07 LAB — TROPONIN I (HIGH SENSITIVITY)
Troponin I (High Sensitivity): 21 ng/L — ABNORMAL HIGH (ref <18)
Troponin I (High Sensitivity): 22 ng/L — ABNORMAL HIGH (ref <18)

## 2020-11-07 LAB — LIPASE, BLOOD: Lipase: 38 U/L (ref 11–51)

## 2020-11-07 MED ORDER — PANTOPRAZOLE SODIUM 40 MG IV SOLR
40.0000 mg | Freq: Every day | INTRAVENOUS | Status: DC
  Administered 2020-11-07 – 2020-11-08 (×2): 40 mg via INTRAVENOUS
  Filled 2020-11-07 (×2): qty 40

## 2020-11-07 MED ORDER — KETOROLAC TROMETHAMINE 15 MG/ML IJ SOLN
15.0000 mg | Freq: Four times a day (QID) | INTRAMUSCULAR | Status: DC | PRN
  Filled 2020-11-07: qty 1

## 2020-11-07 MED ORDER — PROCHLORPERAZINE EDISYLATE 10 MG/2ML IJ SOLN: 5.0000 mg | Freq: Four times a day (QID) | INTRAMUSCULAR | Status: DC | PRN

## 2020-11-07 MED ORDER — MELATONIN 5 MG PO TABS: 5.0000 mg | ORAL_TABLET | Freq: Every evening | ORAL | Status: DC | PRN

## 2020-11-07 MED ORDER — SODIUM CHLORIDE 0.9 % IV SOLN: INTRAVENOUS | Status: DC

## 2020-11-07 MED ORDER — HEPARIN SODIUM (PORCINE) 5000 UNIT/ML IJ SOLN
5000.0000 [IU] | Freq: Three times a day (TID) | INTRAMUSCULAR | Status: DC
  Administered 2020-11-07 – 2020-11-09 (×4): 5000 [IU] via SUBCUTANEOUS
  Filled 2020-11-07 (×4): qty 1

## 2020-11-07 MED ORDER — SERTRALINE HCL 50 MG PO TABS
50.0000 mg | ORAL_TABLET | Freq: Every day | ORAL | Status: DC
  Administered 2020-11-09: 50 mg via ORAL
  Filled 2020-11-07: qty 1

## 2020-11-07 MED ORDER — GADOBUTROL 1 MMOL/ML IV SOLN
7.0000 mL | Freq: Once | INTRAVENOUS | Status: AC | PRN
  Administered 2020-11-07: 7 mL via INTRAVENOUS
  Filled 2020-11-07: qty 7.5

## 2020-11-07 MED ORDER — PROCHLORPERAZINE MALEATE 10 MG PO TABS
10.0000 mg | ORAL_TABLET | Freq: Four times a day (QID) | ORAL | Status: DC | PRN
  Filled 2020-11-07: qty 1

## 2020-11-07 MED ORDER — SODIUM CHLORIDE 0.9 % IV SOLN
2.0000 g | INTRAVENOUS | Status: DC
  Administered 2020-11-07 – 2020-11-08 (×2): 2 g via INTRAVENOUS
  Filled 2020-11-07: qty 20
  Filled 2020-11-07: qty 2
  Filled 2020-11-07: qty 20

## 2020-11-07 MED ORDER — ONDANSETRON HCL 4 MG/2ML IJ SOLN
4.0000 mg | Freq: Four times a day (QID) | INTRAMUSCULAR | Status: DC | PRN
Start: 2020-11-07 — End: 2020-11-09

## 2020-11-07 MED ORDER — MORPHINE SULFATE (PF) 2 MG/ML IV SOLN: 2.0000 mg | INTRAVENOUS | Status: DC | PRN

## 2020-11-07 MED ORDER — ACETAMINOPHEN 500 MG PO TABS
1000.0000 mg | ORAL_TABLET | Freq: Four times a day (QID) | ORAL | Status: DC
  Administered 2020-11-07 – 2020-11-09 (×4): 1000 mg via ORAL
  Filled 2020-11-07 (×5): qty 2

## 2020-11-07 MED ORDER — PIPERACILLIN-TAZOBACTAM 3.375 G IVPB 30 MIN
3.3750 g | Freq: Once | INTRAVENOUS | Status: AC
  Administered 2020-11-07: 3.375 g via INTRAVENOUS
  Filled 2020-11-07: qty 50

## 2020-11-07 MED ORDER — METOPROLOL SUCCINATE ER 25 MG PO TB24
25.0000 mg | ORAL_TABLET | Freq: Every day | ORAL | Status: DC
  Administered 2020-11-08 – 2020-11-09 (×2): 25 mg via ORAL
  Filled 2020-11-07 (×2): qty 1

## 2020-11-07 MED ORDER — DIPHENHYDRAMINE HCL 50 MG/ML IJ SOLN: 12.5000 mg | Freq: Four times a day (QID) | INTRAMUSCULAR | Status: DC | PRN

## 2020-11-07 MED ORDER — OXYCODONE HCL 5 MG PO TABS
5.0000 mg | ORAL_TABLET | ORAL | Status: DC | PRN
  Administered 2020-11-08: 5 mg via ORAL
  Filled 2020-11-07: qty 2

## 2020-11-07 MED ORDER — ONDANSETRON 4 MG PO TBDP
4.0000 mg | ORAL_TABLET | Freq: Four times a day (QID) | ORAL | Status: DC | PRN
Start: 2020-11-07 — End: 2020-11-09

## 2020-11-07 MED ORDER — DIPHENHYDRAMINE HCL 12.5 MG/5ML PO ELIX
12.5000 mg | ORAL_SOLUTION | Freq: Four times a day (QID) | ORAL | Status: DC | PRN
  Filled 2020-11-07: qty 5

## 2020-11-07 NOTE — ED Triage Notes (Signed)
Pt c/o sudden onset epigastric/chest pain today with nausea and feeling clammy. Pt is a/ox4 on arrival , ambulatory with a steady gait

## 2020-11-07 NOTE — ED Notes (Signed)
Patient provided ice chips as approved by Dr. Tamala Julian.

## 2020-11-07 NOTE — ED Notes (Signed)
Family updated as to patient's status. Family at bedside. POC discussed with patient as well.

## 2020-11-07 NOTE — ED Notes (Signed)
ED Provider at bedside. 

## 2020-11-07 NOTE — ED Notes (Signed)
Patient transported to MRI 

## 2020-11-07 NOTE — ED Notes (Addendum)
Patient reports valve insufficieny, and hx of cardiomegaly. See triage note - patient reports chest pain starting at work this morning. Patient reports pain is aching, and mild to central chest.  Patient denies SOB.

## 2020-11-07 NOTE — ED Notes (Signed)
Foods within clear liquid diet provided to patient.

## 2020-11-07 NOTE — Progress Notes (Signed)
Case d.w Dr. Tamala Julian acute cholecystitis per [place] and MRI, no CBD stones. Admit , a/bs and prompt cholecystectomy in am. Full consult to follow.

## 2020-11-07 NOTE — ED Notes (Signed)
Patient transported to Ultrasound 

## 2020-11-07 NOTE — ED Provider Notes (Signed)
[facility] Emergency Department Provider Note ____________________________________________   Event Date/Time   First MD Initiated Contact with Patient 11/07/20 1143     (approximate)  I have reviewed the triage vital signs and the nursing notes.  HISTORY  Chief Complaint Chest Pain   HPI Mary Walter is a 65 y.o. femalewho presents to the ED for evaluation of chest pain.   Chart review indicates history of CAD, HTN, HLD, HOCM with mild LVOT in the past.  Left breast cancer s/p lumpectomy and radiation last year.  Patient presents to the ED, accompanied by her husband and son provide some additional history, for evaluation of chest/epigastric discomfort throughout the day today.  She reports feeling normal yesterday, and felt the day today has had an indigestion sensation to her lower chest and epigastrium which is normal for her.  Reports associated nausea without emesis.  Reports this discomfort sometimes transmit to her back.  Denies shortness of breath, cough or fevers.  Denies syncope or dizziness.  Denies diarrhea, stool changes, dysuria or lower abdominal discomfort.  Past Medical History:  Diagnosis Date  . Breast cancer ([place])   . Chest pain    a. 10/2015 MV: abnl ECG w/ inf/antlat ST depression w/ exercise (in setting of LVH @ baseline). No ischemia/infarct.  Marland Kitchen Dyspnea    WITH EXERTION DUE TO ENLARGED HEART  . Family history of adverse reaction to anesthesia    SON-LUNGS FILLED UP WITH FLUID AFTER APPENDECTOMY  . Hematuria   . Hypercholesterolemia   . Hypertension   . Hypertrophic cardiomegaly    a. 10/2019 Echo: EF 70-75%, mod LVH. No rwma. Mild systolic anterior motion of the mitral valve with mild LVOT obstruction (peak gradient 20 mmHg at rest and 26 mmHg with Valsalva).  Normal RV systolic function.  Normal PASP.  Mildly dilated LA.  Marland Kitchen Personal history of radiation therapy   . Systolic murmur     Patient Active Problem List    Diagnosis Date Noted  . Obstructive hypertrophic cardiomyopathy ([place]) 07/01/2020  . Coronary artery disease of native artery of native heart with stable angina pectoris ([place]) 07/01/2020  . DCIS (ductal carcinoma in situ) 07/01/2020  . Rash of genitalia 02/24/2020  . Chest tightness 11/19/2019  . Chest pain 11/19/2019  . Elevated troponin   . Depression   . Mitral regurgitation 10/26/2015  . Shortness of breath 10/26/2015  . Incomplete right bundle branch block 10/26/2015  . Family history of premature CAD 10/26/2015  . Health care maintenance 09/06/2014  . Stress 03/13/2014  . Hematuria 10/11/2012  . Hyperlipidemia 10/11/2012  . Essential hypertension 10/11/2012    Past Surgical History:  Procedure Laterality Date  . BREAST BIOPSY Left 10/27/2019   Affirm bx-distortion-     clip-path ADH  . BREAST BIOPSY Left 01/07/2020   Affim bx 1 area/ x clip and ribbon clip/ ADH  . BREAST LUMPECTOMY    . BREAST LUMPECTOMY WITH NEEDLE LOCALIZATION Left 02/14/2020   Procedure: BREAST LUMPECTOMY WITH NEEDLE LOCALIZATION;  Surgeon: Robert Bellow, MD;  Location: [facility] ORS;  Service: General;  Laterality: Left;  . FACIAL FRACTURE SURGERY     X 2    Prior to Admission medications   Medication Sig Start Date End Date Taking? Authorizing Provider  aspirin EC 81 MG EC tablet Take 1 tablet (81 mg total) by mouth daily. 11/21/19   Loletha Grayer, MD  atorvastatin (LIPITOR) 40 MG tablet Take 1 tablet (40 mg total) by mouth daily.  07/17/20   Antonieta Iba, MD  metoprolol succinate (TOPROL XL) 25 MG 24 hr tablet Take 1 tablet (25 mg total) by mouth daily. 05/01/20   Antonieta Iba, MD  Multiple Vitamin (MULTIVITAMIN) tablet Take 1 tablet by mouth daily.    [provider]  sertraline (ZOLOFT) 50 MG tablet Take 1 tablet by mouth once daily 09/20/20   Dale Pronghorn, MD  tamoxifen (NOLVADEX) 20 MG tablet Take 20 mg by mouth daily. 06/08/20   [provider]  Vitamin D,  Cholecalciferol, 10 MCG (400 UNIT) CAPS Take by mouth.    [provider]    Allergies Macrobid [nitrofurantoin macrocrystal] and Penicillins  Family History  Problem Relation Age of Onset  . Diabetes Mother   . Heart disease Mother   . Hypertension Mother   . Cancer Father        prostate   . Diverticulitis Father   . CVA Father   . Heart disease Father   . Stroke Father   . Hypertension Father   . Heart murmur Father   . Heart attack Brother   . CVA Paternal Grandmother   . Breast cancer Cousin     Social History Social History   Tobacco Use  . Smoking status: Never Smoker  . Smokeless tobacco: Never Used  Vaping Use  . Vaping Use: Never used  Substance Use Topics  . Alcohol use: No    Alcohol/week: 0.0 standard drinks  . Drug use: No    Review of Systems  Constitutional: No fever/chills Eyes: No visual changes. ENT: No sore throat. Cardiovascular: Positive for chest pain. Respiratory: Denies shortness of breath. Gastrointestinal: Positive for epigastric discomfort and nausea  no vomiting.  No diarrhea.  No constipation. Genitourinary: Negative for dysuria. Musculoskeletal: Negative for back pain. Skin: Negative for rash. Neurological: Negative for headaches, focal weakness or numbness.   ____________________________________________   PHYSICAL EXAM:  VITAL SIGNS: Vitals:   11/07/20 1715 11/07/20 1827  BP: 124/61 (!) 140/59  Pulse: 65 66  Resp: 16 16  Temp:    SpO2: 100% 99%     Constitutional: Alert and oriented. Well appearing and in no acute distress. Eyes: Conjunctivae are normal. PERRL. EOMI. Head: Atraumatic. Nose: No congestion/rhinnorhea. Mouth/Throat: Mucous membranes are moist.  Oropharynx non-erythematous. Neck: No stridor. No cervical spine tenderness to palpation. Cardiovascular: Normal rate, regular rhythm. Grossly normal heart sounds.  Good peripheral circulation. Respiratory: Normal respiratory effort.  No  retractions. Lungs CTAB. Gastrointestinal: Soft , nondistended. No CVA tenderness. Mild epigastric and RUQ tenderness without peritoneal features.  Abdomen is otherwise benign. Musculoskeletal: No lower extremity tenderness nor edema.  No joint effusions. No signs of acute trauma. Neurologic:  Normal speech and language. No gross focal neurologic deficits are appreciated. No gait instability noted. Skin:  Skin is warm, dry and intact. No rash noted. Psychiatric: Mood and affect are normal. Speech and behavior are normal.  ____________________________________________   LABS (all labs ordered are listed, but only abnormal results are displayed)  Labs Reviewed  BASIC METABOLIC PANEL - Abnormal; Notable for the following components:      Result Value   Glucose, Bld 112 (*)    All other components within normal limits  CBC - Abnormal; Notable for the following components:   WBC 11.6 (*)    Platelets 142 (*)    All other components within normal limits  HEPATIC FUNCTION PANEL - Abnormal; Notable for the following components:   Total Protein 6.4 (*)  AST 78 (*)    ALT 71 (*)    Total Bilirubin 1.4 (*)    Bilirubin, Direct 0.5 (*)    All other components within normal limits  TROPONIN I (HIGH SENSITIVITY) - Abnormal; Notable for the following components:   Troponin I (High Sensitivity) 21 (*)    All other components within normal limits  TROPONIN I (HIGH SENSITIVITY) - Abnormal; Notable for the following components:   Troponin I (High Sensitivity) 22 (*)    All other components within normal limits  RESP PANEL BY RT-PCR (FLU A&B, COVID) ARPGX2  LIPASE, BLOOD   ____________________________________________  12 Lead EKG  Sinus rhythm, rate of 68 bpm.  Normal axis.  Right bundle branch block at baseline.  Chronic ST elevation to aVR and ST segment depressions inferiorly and laterally, similar when compared to EKG from 10/31/2020, and  01/19/2020. ____________________________________________  RADIOLOGY  ED MD interpretation: 2 view CXR reviewed by me without evidence of acute cardiopulmonary pathology.  Official radiology report(s): DG Chest 2 View  Result Date: 11/07/2020 CLINICAL DATA:  65 year old female with chest and epigastric pain. EXAM: CHEST - 2 VIEW COMPARISON:  Cardiac CT 12/16/2019 and chest radiographs 11/19/2019. FINDINGS: Lung volumes and mediastinal contours are stable and within normal limits. Visualized tracheal air column is within normal limits. Stable lung markings since last year. No pneumothorax, pulmonary edema, pleural effusion or confluent pulmonary opacity. No acute osseous abnormality identified. Paucity of bowel gas in the upper abdomen. IMPRESSION: No acute cardiopulmonary abnormality. Electronically Signed   By: Genevie Ann M.D.   On: 11/07/2020 11:59   MR 3D Recon At Scanner  Result Date: 11/07/2020 CLINICAL DATA:  Cholelithiasis and elevated liver function studies. EXAM: MRI ABDOMEN WITHOUT AND WITH CONTRAST (INCLUDING MRCP) TECHNIQUE: Multiplanar multisequence MR imaging of the abdomen was performed both before and after the administration of intravenous contrast. Heavily T2-weighted images of the biliary and pancreatic ducts were obtained, and three-dimensional MRCP images were rendered by post processing. Multiplanar multisequence MR imaging of the abdomen was performed without intravenous contrast. Heavily T2-weighted images of the biliary and pancreatic ducts were obtained, and three-dimensional MRCP images were rendered by post processing. CONTRAST:  21mL GADAVIST GADOBUTROL 1 MMOL/ML IV SOLN COMPARISON:  Ultrasound 11/07/2020 FINDINGS: Lower chest: The lung bases are grossly clear. Small pleural effusions but no pulmonary lesions or pericardial effusion. Hepatobiliary: Left hepatic lobe cyst is noted. No worrisome hepatic lesions or intrahepatic biliary dilatation. Gallbladder demonstrates layering  gallstones. There is also significant gallbladder wall thickening and pericholecystic fluid consistent with acute cholecystitis. Normal caliber and course of the common bile duct. No common bile duct stones. Pancreas:  No mass, inflammation or ductal dilatation. Spleen:  Normal size.  No focal lesions. Adrenals/Urinary Tract: Adrenal glands and kidneys are unremarkable. Simple right renal cyst. Stomach/Bowel: The stomach, duodenum, small bowel and colon are unremarkable. Vascular/Lymphatic: The aorta and branch vessels are patent. Major venous structures are patent. No mesenteric or retroperitoneal mass or adenopathy. Other:  No ascites or abdominal wall hernia. Musculoskeletal: No significant bony findings. IMPRESSION: 1. MR findings consistent with acute calculus cholecystitis. 2. Normal caliber and course of the common bile duct. No common bile duct stones. 3. Left hepatic lobe cyst. Electronically Signed   By: Marijo Sanes M.D.   On: 11/07/2020 19:01   MR ABDOMEN MRCP W WO CONTAST  Result Date: 11/07/2020 CLINICAL DATA:  Cholelithiasis and elevated liver function studies. EXAM: MRI ABDOMEN WITHOUT AND WITH CONTRAST (INCLUDING MRCP) TECHNIQUE: Multiplanar multisequence MR  imaging of the abdomen was performed both before and after the administration of intravenous contrast. Heavily T2-weighted images of the biliary and pancreatic ducts were obtained, and three-dimensional MRCP images were rendered by post processing. Multiplanar multisequence MR imaging of the abdomen was performed without intravenous contrast. Heavily T2-weighted images of the biliary and pancreatic ducts were obtained, and three-dimensional MRCP images were rendered by post processing. CONTRAST:  57mL GADAVIST GADOBUTROL 1 MMOL/ML IV SOLN COMPARISON:  Ultrasound 11/07/2020 FINDINGS: Lower chest: The lung bases are grossly clear. Small pleural effusions but no pulmonary lesions or pericardial effusion. Hepatobiliary: Left hepatic lobe cyst is  noted. No worrisome hepatic lesions or intrahepatic biliary dilatation. Gallbladder demonstrates layering gallstones. There is also significant gallbladder wall thickening and pericholecystic fluid consistent with acute cholecystitis. Normal caliber and course of the common bile duct. No common bile duct stones. Pancreas:  No mass, inflammation or ductal dilatation. Spleen:  Normal size.  No focal lesions. Adrenals/Urinary Tract: Adrenal glands and kidneys are unremarkable. Simple right renal cyst. Stomach/Bowel: The stomach, duodenum, small bowel and colon are unremarkable. Vascular/Lymphatic: The aorta and branch vessels are patent. Major venous structures are patent. No mesenteric or retroperitoneal mass or adenopathy. Other:  No ascites or abdominal wall hernia. Musculoskeletal: No significant bony findings. IMPRESSION: 1. MR findings consistent with acute calculus cholecystitis. 2. Normal caliber and course of the common bile duct. No common bile duct stones. 3. Left hepatic lobe cyst. Electronically Signed   By: Marijo Sanes M.D.   On: 11/07/2020 19:01   US ABDOMEN LIMITED RUQ (LIVER/GB)  Result Date: 11/07/2020 CLINICAL DATA:  Acute right upper quadrant abdominal pain. EXAM: ULTRASOUND ABDOMEN LIMITED RIGHT UPPER QUADRANT COMPARISON:  None. FINDINGS: Gallbladder: Minimal cholelithiasis. No gallbladder wall thickening or pericholecystic fluid is noted. No sonographic Murphy's sign is noted. Common bile duct: Diameter: 3 mm which is within normal limits. Liver: No focal lesion identified. Within normal limits in parenchymal echogenicity. Portal vein is patent on color Doppler imaging with normal direction of blood flow towards the liver. Other: None. IMPRESSION: Minimal cholelithiasis without cholecystitis. No other abnormality seen in the right upper quadrant of the abdomen. Electronically Signed   By: Marijo Conception M.D.   On: 11/07/2020 14:43     ____________________________________________   PROCEDURES and INTERVENTIONS  Procedure(s) performed (including Critical Care):  .1-3 Lead EKG Interpretation Performed by: Vladimir Crofts, MD Authorized by: Vladimir Crofts, MD     Interpretation: normal     ECG rate:  70   ECG rate assessment: normal     Rhythm: sinus rhythm     Ectopy: none     Conduction: normal      Medications  piperacillin-tazobactam (ZOSYN) IVPB 3.375 g (has no administration in time range)  gadobutrol (GADAVIST) 1 MMOL/ML injection 7 mL (7 mLs Intravenous Contrast Given 11/07/20 1809)    ____________________________________________   MDM / ED COURSE   65 year old woman presents with epigastric pain with evidence of cholecystitis requiring surgical admission for cholecystectomy in the morning.  Normal vitals.  Patient looks well clinically with some mild epigastric and RUQ tenderness without peritoneal features.  Blood work shows slight LFT and bilirubin bump, and mild leukocytosis.  RUQ ultrasound confirms gallstones, and due to stones and LFT derangements, MRCP obtained and demonstrates no choledocholithiasis.  No evidence of ACS, pneumonia or further cardiopulmonary pathology.  Discussed the case with surgery, who recommends antibiotics and admission with plans for cholecystectomy in the morning.   Clinical Course as of 11/07/20 1929  Tue Nov 07, 2020  1501 Reassessed.  Patient reports being asymptomatic.  We discussed work-up with evidence of cholelithiasis.  We discussed her blood work derangements and need for MRI to assess for choledocholithiasis.  Answered questions. [DS]  1916 Discussed MRI findings with the patient.  We discussed no choledocholithiasis, but signs of acute cholecystitis.  Discussed with her my recommendations for surgical evaluation, she is agreeable. Dr. Dahlia Byes paged [DS]  1928 Pt agrees to admission. [DS]    Clinical Course User Index [DS] Vladimir Crofts, MD     ____________________________________________   FINAL CLINICAL IMPRESSION(S) / ED DIAGNOSES  Final diagnoses:  RUQ abdominal pain  Cholecystitis     ED Discharge Orders    None       Amisadai Woodford Tamala Julian   Note:  This document was prepared using Dragon voice recognition software and may include unintentional dictation errors.   Vladimir Crofts, MD 11/07/20 [phone]

## 2020-11-08 ENCOUNTER — Inpatient Hospital Stay: Payer: Medicare HMO | Admitting: Anesthesiology

## 2020-11-08 ENCOUNTER — Encounter
Admission: EM | Disposition: A | Discharge status: Home / Self Care | Payer: Self-pay | Source: Home / Self Care | Attending: Emergency Medicine

## 2020-11-08 ENCOUNTER — Encounter: Payer: Self-pay | Admitting: Surgery

## 2020-11-08 DIAGNOSIS — K8010 Calculus of gallbladder with chronic cholecystitis without obstruction: Secondary | ICD-10-CM

## 2020-11-08 DIAGNOSIS — I2510 Atherosclerotic heart disease of native coronary artery without angina pectoris: Secondary | ICD-10-CM

## 2020-11-08 DIAGNOSIS — K810 Acute cholecystitis: Secondary | ICD-10-CM | POA: Diagnosis not present

## 2020-11-08 DIAGNOSIS — Z01818 Encounter for other preprocedural examination: Secondary | ICD-10-CM

## 2020-11-08 DIAGNOSIS — K8000 Calculus of gallbladder with acute cholecystitis without obstruction: Secondary | ICD-10-CM | POA: Diagnosis not present

## 2020-11-08 DIAGNOSIS — R0602 Shortness of breath: Secondary | ICD-10-CM | POA: Diagnosis not present

## 2020-11-08 DIAGNOSIS — R778 Other specified abnormalities of plasma proteins: Secondary | ICD-10-CM

## 2020-11-08 DIAGNOSIS — Z9049 Acquired absence of other specified parts of digestive tract: Secondary | ICD-10-CM | POA: Diagnosis not present

## 2020-11-08 DIAGNOSIS — Z7689 Persons encountering health services in other specified circumstances: Secondary | ICD-10-CM | POA: Diagnosis not present

## 2020-11-08 LAB — CBC
HCT: 35.8 % — ABNORMAL LOW (ref 36.0–46.0)
Hemoglobin: 12.2 g/dL (ref 12.0–15.0)
MCH: 30.7 pg (ref 26.0–34.0)
MCHC: 34.1 g/dL (ref 30.0–36.0)
MCV: 89.9 fL (ref 80.0–100.0)
Platelets: 112 10*3/uL — ABNORMAL LOW (ref 150–400)
RBC: 3.98 MIL/uL (ref 3.87–5.11)
RDW: 13.3 % (ref 11.5–15.5)
WBC: 4.9 10*3/uL (ref 4.0–10.5)
nRBC: 0 % (ref 0.0–0.2)

## 2020-11-08 LAB — BASIC METABOLIC PANEL
Anion gap: 5 (ref 5–15)
BUN: 13 mg/dL (ref 8–23)
CO2: 24 mmol/L (ref 22–32)
Calcium: 8.8 mg/dL — ABNORMAL LOW (ref 8.9–10.3)
Chloride: 113 mmol/L — ABNORMAL HIGH (ref 98–111)
Creatinine, Ser: 0.9 mg/dL (ref 0.44–1.00)
GFR, Estimated: >60 mL/min (ref >60)
Glucose, Bld: 86 mg/dL (ref 70–99)
Potassium: 4.2 mmol/L (ref 3.5–5.1)
Sodium: 142 mmol/L (ref 135–145)

## 2020-11-08 SURGERY — CHOLECYSTECTOMY, ROBOT-ASSISTED, LAPAROSCOPIC
Anesthesia: General | Site: Abdomen

## 2020-11-08 MED ORDER — LIDOCAINE HCL (PF) 2 % IJ SOLN
INTRAMUSCULAR | Status: AC
  Filled 2020-11-08: qty 5

## 2020-11-08 MED ORDER — KETOROLAC TROMETHAMINE 30 MG/ML IJ SOLN
30.0000 mg | Freq: Four times a day (QID) | INTRAMUSCULAR | Status: DC
  Administered 2020-11-08 – 2020-11-09 (×4): 30 mg via INTRAVENOUS
  Filled 2020-11-08 (×4): qty 1

## 2020-11-08 MED ORDER — LIDOCAINE HCL (CARDIAC) PF 100 MG/5ML IV SOSY
PREFILLED_SYRINGE | INTRAVENOUS | Status: DC | PRN
  Administered 2020-11-08: 50 mg via INTRAVENOUS

## 2020-11-08 MED ORDER — KETOROLAC TROMETHAMINE 30 MG/ML IJ SOLN
INTRAMUSCULAR | Status: DC | PRN
  Administered 2020-11-08: 30 mg via INTRAVENOUS

## 2020-11-08 MED ORDER — FENTANYL CITRATE (PF) 100 MCG/2ML IJ SOLN
INTRAMUSCULAR | Status: AC
  Filled 2020-11-08: qty 2

## 2020-11-08 MED ORDER — ONDANSETRON HCL 4 MG/2ML IJ SOLN
INTRAMUSCULAR | Status: AC
  Filled 2020-11-08: qty 2

## 2020-11-08 MED ORDER — GLYCOPYRROLATE 0.2 MG/ML IJ SOLN
INTRAMUSCULAR | Status: AC
  Filled 2020-11-08: qty 1

## 2020-11-08 MED ORDER — PROMETHAZINE HCL 25 MG/ML IJ SOLN: 6.2500 mg | INTRAMUSCULAR | Status: DC | PRN

## 2020-11-08 MED ORDER — EPHEDRINE SULFATE 50 MG/ML IJ SOLN
INTRAMUSCULAR | Status: DC | PRN
  Administered 2020-11-08 (×2): 10 mg via INTRAVENOUS

## 2020-11-08 MED ORDER — GLYCOPYRROLATE 0.2 MG/ML IJ SOLN
INTRAMUSCULAR | Status: DC | PRN
  Administered 2020-11-08: .2 mg via INTRAVENOUS

## 2020-11-08 MED ORDER — FENTANYL CITRATE (PF) 100 MCG/2ML IJ SOLN
25.0000 ug | INTRAMUSCULAR | Status: DC | PRN
  Administered 2020-11-08 (×2): 25 ug via INTRAVENOUS
  Administered 2020-11-08: 50 ug via INTRAVENOUS

## 2020-11-08 MED ORDER — DEXAMETHASONE SODIUM PHOSPHATE 10 MG/ML IJ SOLN
INTRAMUSCULAR | Status: AC
  Filled 2020-11-08: qty 1

## 2020-11-08 MED ORDER — PROPOFOL 10 MG/ML IV BOLUS
INTRAVENOUS | Status: DC | PRN
  Administered 2020-11-08: 150 mg via INTRAVENOUS

## 2020-11-08 MED ORDER — ROCURONIUM BROMIDE 10 MG/ML (PF) SYRINGE
PREFILLED_SYRINGE | INTRAVENOUS | Status: AC
  Filled 2020-11-08: qty 10

## 2020-11-08 MED ORDER — PROPOFOL 10 MG/ML IV BOLUS
INTRAVENOUS | Status: AC
  Filled 2020-11-08: qty 20

## 2020-11-08 MED ORDER — FENTANYL CITRATE (PF) 100 MCG/2ML IJ SOLN
INTRAMUSCULAR | Status: DC | PRN
  Administered 2020-11-08 (×2): 50 ug via INTRAVENOUS

## 2020-11-08 MED ORDER — ACETAMINOPHEN 10 MG/ML IV SOLN
INTRAVENOUS | Status: DC | PRN
  Administered 2020-11-08: 1000 mg via INTRAVENOUS

## 2020-11-08 MED ORDER — BUPIVACAINE-EPINEPHRINE (PF) 0.25% -1:200000 IJ SOLN
INTRAMUSCULAR | Status: DC | PRN
  Administered 2020-11-08: 30 mL via PERINEURAL

## 2020-11-08 MED ORDER — SODIUM CHLORIDE FLUSH 0.9 % IV SOLN
INTRAVENOUS | Status: AC
  Filled 2020-11-08: qty 10

## 2020-11-08 MED ORDER — ROCURONIUM BROMIDE 100 MG/10ML IV SOLN
INTRAVENOUS | Status: DC | PRN
  Administered 2020-11-08: 50 mg via INTRAVENOUS

## 2020-11-08 MED ORDER — SODIUM CHLORIDE 0.9 % IV SOLN: INTRAVENOUS | Status: DC

## 2020-11-08 MED ORDER — BUPIVACAINE LIPOSOME 1.3 % IJ SUSP
INTRAMUSCULAR | Status: DC | PRN
  Administered 2020-11-08: 20 mL

## 2020-11-08 MED ORDER — ACETAMINOPHEN 10 MG/ML IV SOLN
INTRAVENOUS | Status: AC
  Filled 2020-11-08: qty 100

## 2020-11-08 MED ORDER — KETOROLAC TROMETHAMINE 30 MG/ML IJ SOLN
INTRAMUSCULAR | Status: AC
  Filled 2020-11-08: qty 1

## 2020-11-08 MED ORDER — INDOCYANINE GREEN 25 MG IV SOLR
7.5000 mg | INTRAVENOUS | Status: AC
  Administered 2020-11-08: 7.5 mg via INTRAVENOUS
  Filled 2020-11-08: qty 3

## 2020-11-08 MED ORDER — EPHEDRINE 5 MG/ML INJ
INTRAVENOUS | Status: AC
  Filled 2020-11-08: qty 10

## 2020-11-08 MED ORDER — BUPIVACAINE LIPOSOME 1.3 % IJ SUSP
INTRAMUSCULAR | Status: AC
  Filled 2020-11-08: qty 20

## 2020-11-08 MED ORDER — MIDAZOLAM HCL 2 MG/2ML IJ SOLN
INTRAMUSCULAR | Status: DC | PRN
  Administered 2020-11-08: 2 mg via INTRAVENOUS

## 2020-11-08 MED ORDER — SUGAMMADEX SODIUM 200 MG/2ML IV SOLN
INTRAVENOUS | Status: DC | PRN
  Administered 2020-11-08: 200 mg via INTRAVENOUS

## 2020-11-08 MED ORDER — MIDAZOLAM HCL 2 MG/2ML IJ SOLN
INTRAMUSCULAR | Status: AC
  Filled 2020-11-08: qty 2

## 2020-11-08 MED ORDER — ONDANSETRON HCL 4 MG/2ML IJ SOLN
INTRAMUSCULAR | Status: DC | PRN
  Administered 2020-11-08: 4 mg via INTRAVENOUS

## 2020-11-08 MED ORDER — DEXAMETHASONE SODIUM PHOSPHATE 10 MG/ML IJ SOLN
INTRAMUSCULAR | Status: DC | PRN
  Administered 2020-11-08: 10 mg via INTRAVENOUS

## 2020-11-08 SURGICAL SUPPLY — 48 items
CANISTER SUCT 1200ML W/VALVE (MISCELLANEOUS) IMPLANT
CANNULA REDUC XI 12-8 STAPL (CANNULA) ×1
CANNULA REDUCER 12-8 DVNC XI (CANNULA) ×1 IMPLANT
CHLORAPREP W/TINT 26 (MISCELLANEOUS) ×2 IMPLANT
CLIP VESOLOCK MED LG 6/CT (CLIP) ×2 IMPLANT
COVER WAND RF STERILE (DRAPES) IMPLANT
DECANTER SPIKE VIAL GLASS SM (MISCELLANEOUS) IMPLANT
DEFOGGER SCOPE WARMER CLEARIFY (MISCELLANEOUS) ×2 IMPLANT
DERMABOND ADVANCED (GAUZE/BANDAGES/DRESSINGS) ×1
DERMABOND ADVANCED .7 DNX12 (GAUZE/BANDAGES/DRESSINGS) ×1 IMPLANT
DRAPE ARM DVNC X/XI (DISPOSABLE) ×4 IMPLANT
DRAPE COLUMN DVNC XI (DISPOSABLE) ×1 IMPLANT
DRAPE DA VINCI XI ARM (DISPOSABLE) ×4
DRAPE DA VINCI XI COLUMN (DISPOSABLE) ×1
ELECT CAUTERY BLADE 6.4 (BLADE) ×2 IMPLANT
ELECT REM PT RETURN 9FT ADLT (ELECTROSURGICAL) ×2
ELECTRODE REM PT RTRN 9FT ADLT (ELECTROSURGICAL) ×1 IMPLANT
GLOVE SURG ENC MOIS LTX SZ7 (GLOVE) ×4 IMPLANT
GOWN STRL REUS W/ TWL LRG LVL3 (GOWN DISPOSABLE) ×4 IMPLANT
GOWN STRL REUS W/TWL LRG LVL3 (GOWN DISPOSABLE) ×4
IRRIGATION STRYKERFLOW (MISCELLANEOUS) IMPLANT
IRRIGATOR STRYKERFLOW (MISCELLANEOUS)
IV NS 1000ML (IV SOLUTION)
IV NS 1000ML BAXH (IV SOLUTION) IMPLANT
KIT PINK PAD W/HEAD ARE REST (MISCELLANEOUS) ×2
KIT PINK PAD W/HEAD ARM REST (MISCELLANEOUS) ×1 IMPLANT
LABEL OR SOLS (LABEL) ×2 IMPLANT
MANIFOLD NEPTUNE II (INSTRUMENTS) ×2 IMPLANT
NEEDLE HYPO 22GX1.5 SAFETY (NEEDLE) ×2 IMPLANT
NS IRRIG 500ML POUR BTL (IV SOLUTION) ×2 IMPLANT
OBTURATOR OPTICAL STANDARD 8MM (TROCAR) ×1
OBTURATOR OPTICAL STND 8 DVNC (TROCAR) ×1
OBTURATOR OPTICALSTD 8 DVNC (TROCAR) ×1 IMPLANT
PACK LAP CHOLECYSTECTOMY (MISCELLANEOUS) ×2 IMPLANT
PENCIL ELECTRO HAND CTR (MISCELLANEOUS) ×2 IMPLANT
POUCH SPECIMEN RETRIEVAL 10MM (ENDOMECHANICALS) ×2 IMPLANT
SEAL CANN UNIV 5-8 DVNC XI (MISCELLANEOUS) ×3 IMPLANT
SEAL XI 5MM-8MM UNIVERSAL (MISCELLANEOUS) ×3
SET TUBE SMOKE EVAC HIGH FLOW (TUBING) ×2 IMPLANT
SOLUTION ELECTROLUBE (MISCELLANEOUS) ×2 IMPLANT
SPONGE LAP 18X18 RF (DISPOSABLE) ×2 IMPLANT
SPONGE LAP 4X18 RFD (DISPOSABLE) IMPLANT
STAPLER CANNULA SEAL DVNC XI (STAPLE) ×1 IMPLANT
STAPLER CANNULA SEAL XI (STAPLE) ×1
SUT MNCRL AB 4-0 PS2 18 (SUTURE) ×2 IMPLANT
SUT VICRYL 0 AB UR-6 (SUTURE) ×4 IMPLANT
TAPE TRANSPORE STRL 2 31045 (GAUZE/BANDAGES/DRESSINGS) ×2 IMPLANT
TROCAR BALLN GELPORT 12X130M (ENDOMECHANICALS) ×2 IMPLANT

## 2020-11-08 NOTE — Anesthesia Postprocedure Evaluation (Signed)
Anesthesia Post Note  Patient: Mary Walter  Procedure(s) Performed: XI ROBOTIC ASSISTED LAPAROSCOPIC CHOLECYSTECTOMY (N/A Abdomen)  Patient location during evaluation: PACU Anesthesia Type: General Level of consciousness: awake and alert Pain management: pain level controlled Vital Signs Assessment: post-procedure vital signs reviewed and stable Respiratory status: spontaneous breathing, nonlabored ventilation, respiratory function stable and patient connected to nasal cannula oxygen Cardiovascular status: blood pressure returned to baseline and stable Postop Assessment: no apparent nausea or vomiting Anesthetic complications: no   No complications documented.   Last Vitals:  Vitals:   11/08/20 1446 11/08/20 1501  BP: 137/64 (!) 141/65  Pulse: 84 84  Resp: 13 18  Temp:  36.7 C  SpO2: 93% 97%    Last Pain:  Vitals:   11/08/20 1501  TempSrc: Oral  PainSc:                  [name]

## 2020-11-08 NOTE — Plan of Care (Signed)

## 2020-11-08 NOTE — H&P (Signed)
[facility] SURGICAL HISTORY & PHYSICAL (cpt [id number])  HISTORY OF PRESENT ILLNESS (HPI):  64 y.o. female presented to [facility] ED today for chest pain. Patient reported around a 24 hour history of chest / epigastric pain. She thought initially she was having indigestion but the pain was more severe than normal. This did radiate towards her back. Only additional associated symptom reported is nausea. No fever, chills, cough, SOB, emesis. Urinary changes, or bowel changes. No previous intra-abdominal surgeries. Work up int he ED revealed a leukocytosis to 11.6K and mild hyperbilirubinemia to 1.4. She would undergo RUQ [place] and MRCP in the ED which was concerning for acute cholecystitis without choledocholithiasis.   General surgery is consulted by emergency medicine physician Mary Crofts, MD for evaluation and management of acute cholecystitis.    PAST MEDICAL HISTORY (PMH):  Past Medical History:  Diagnosis Date  . Breast cancer ([place])   . Chest pain    a. 10/2015 MV: abnl ECG w/ inf/antlat ST depression w/ exercise (in setting of LVH @ baseline). No ischemia/infarct.  Marland Kitchen Dyspnea    WITH EXERTION DUE TO ENLARGED HEART  . Family history of adverse reaction to anesthesia    SON-LUNGS FILLED UP WITH FLUID AFTER APPENDECTOMY  . Hematuria   . Hypercholesterolemia   . Hypertension   . Hypertrophic cardiomegaly    a. 10/2019 Echo: EF 70-75%, mod LVH. No rwma. Mild systolic anterior motion of the mitral valve with mild LVOT obstruction (peak gradient 20 mmHg at rest and 26 mmHg with Valsalva).  Normal RV systolic function.  Normal PASP.  Mildly dilated LA.  Marland Kitchen Personal history of radiation therapy   . Systolic murmur     Reviewed. Otherwise negative.   PAST SURGICAL HISTORY ([place]):  Past Surgical History:  Procedure Laterality Date  . BREAST BIOPSY Left 10/27/2019   Affirm bx-distortion-     clip-path ADH  . BREAST BIOPSY Left 01/07/2020   Affim bx 1 area/ x clip and ribbon clip/ ADH  .  BREAST LUMPECTOMY    . BREAST LUMPECTOMY WITH NEEDLE LOCALIZATION Left 02/14/2020   Procedure: BREAST LUMPECTOMY WITH NEEDLE LOCALIZATION;  Surgeon: Mary Bellow, MD;  Location: [facility] ORS;  Service: General;  Laterality: Left;  . FACIAL FRACTURE SURGERY     X 2    Reviewed. Otherwise negative.   MEDICATIONS:  Prior to Admission medications   Medication Sig Start Date End Date Taking? Authorizing Provider  aspirin EC 81 MG EC tablet Take 1 tablet (81 mg total) by mouth daily. 11/21/19   Loletha Grayer, MD  atorvastatin (LIPITOR) 40 MG tablet Take 1 tablet (40 mg total) by mouth daily. 07/17/20   Minna Merritts, MD  metoprolol succinate (TOPROL XL) 25 MG 24 hr tablet Take 1 tablet (25 mg total) by mouth daily. 05/01/20   Minna Merritts, MD  Multiple Vitamin (MULTIVITAMIN) tablet Take 1 tablet by mouth daily.    [provider]  sertraline (ZOLOFT) 50 MG tablet Take 1 tablet by mouth once daily 09/20/20   Einar Pheasant, MD  tamoxifen (NOLVADEX) 20 MG tablet Take 20 mg by mouth daily. 06/08/20   [provider]  Vitamin D, Cholecalciferol, 10 MCG (400 UNIT) CAPS Take by mouth.    [provider]     ALLERGIES:  Allergies  Allergen Reactions  . Macrobid [Nitrofurantoin Macrocrystal]     unknown  . Penicillins     Childhood allergy     SOCIAL HISTORY:  Social History  Socioeconomic History  . Marital status: Married    Spouse name: Not on file  . Number of children: 2  . Years of education: Not on file  . Highest education level: Not on file  Occupational History  . Not on file  Tobacco Use  . Smoking status: Never Smoker  . Smokeless tobacco: Never Used  Vaping Use  . Vaping Use: Never used  Substance and Sexual Activity  . Alcohol use: No    Alcohol/week: 0.0 standard drinks  . Drug use: No  . Sexual activity: Not Currently  Other Topics Concern  . Not on file  Social History Narrative  . Not on file   Social Determinants of  Health   Financial Resource Strain: Not on file  Food Insecurity: Not on file  Transportation Needs: Not on file  Physical Activity: Not on file  Stress: Not on file  Social Connections: Not on file  Intimate Partner Violence: Not on file     FAMILY HISTORY:  Family History  Problem Relation Age of Onset  . Diabetes Mother   . Heart disease Mother   . Hypertension Mother   . Cancer Father        prostate   . Diverticulitis Father   . CVA Father   . Heart disease Father   . Stroke Father   . Hypertension Father   . Heart murmur Father   . Heart attack Brother   . CVA Paternal Grandmother   . Breast cancer Cousin     Otherwise negative.   REVIEW OF SYSTEMS:  Review of Systems  Constitutional: Negative for chills and fever.  Respiratory: Negative for cough and shortness of breath.   Cardiovascular: Negative for chest pain and palpitations.  Gastrointestinal: Positive for abdominal pain and nausea. Negative for constipation, diarrhea and vomiting.  Genitourinary: Negative for dysuria and urgency.  All other systems reviewed and are negative.   VITAL SIGNS:  Temp:  [97.7 F (36.5 C)-98.1 F (36.7 C)] 97.8 F (36.6 C) (05/18 0416) Pulse Rate:  [64-80] 64 (05/18 0416) Resp:  [15-18] 16 (05/18 0416) BP: (124-151)/(59-70) 132/60 (05/18 0416) SpO2:  [98 %-100 %] 99 % (05/18 0416) Weight:  [71.2 kg-75.3 kg] 71.2 kg (05/18 0300)     Height: 5\' 5"  (165.1 cm) Weight: 71.2 kg BMI (Calculated): 26.12   PHYSICAL EXAM:  Physical Exam Vitals and nursing note reviewed. Exam conducted with a chaperone present.  Constitutional:      General: She is not in acute distress.    Appearance: Normal appearance. She is normal weight. She is not ill-appearing.     Comments: Resting comfortably, NAD  HENT:     Head: Normocephalic and atraumatic.  Eyes:     General: No scleral icterus.    Conjunctiva/sclera: Conjunctivae normal.  Cardiovascular:     Rate and Rhythm: Normal rate.      Pulses: Normal pulses.  Pulmonary:     Effort: Pulmonary effort is normal. No respiratory distress.  Abdominal:     General: There is no distension.     Palpations: Abdomen is soft.     Tenderness: There is no abdominal tenderness. There is no guarding or rebound. Negative signs include Murphy's sign.     Comments: Abdomen is soft, non-tender, non- distended, no rebound/guarding. Negative Murphy's Sign   Genitourinary:    Comments: Deferred Musculoskeletal:     Right lower leg: No edema.     Left lower leg: No edema.  Skin:  General: Skin is warm and dry.     Coloration: Skin is not pale.     Findings: No erythema.  Neurological:     General: No focal deficit present.     Mental Status: She is alert and oriented to person, place, and time.  Psychiatric:        Mood and Affect: Mood normal.        Behavior: Behavior normal.     INTAKE/OUTPUT:  This shift: No intake/output data recorded.  Last 2 shifts: @IOLAST2SHIFTS @  Labs:  CBC Latest Ref Rng & Units 11/08/2020 11/07/2020 04/03/2020  WBC 4.0 - 10.5 K/uL 4.9 11.6(H) 9.4  Hemoglobin 12.0 - 15.0 g/dL 12.2 13.6 13.9  Hematocrit 36.0 - 46.0 % 35.8(L) 39.9 39.3  Platelets 150 - 400 K/uL 112(L) 142(L) 200   CMP Latest Ref Rng & Units 11/08/2020 11/07/2020 09/29/2020  Glucose 70 - 99 mg/dL 86 112(H) 82  BUN 8 - 23 mg/dL 13 15 18   Creatinine 0.44 - 1.00 mg/dL 0.90 0.81 0.92  Sodium 135 - 145 mmol/L 142 140 142  Potassium 3.5 - 5.1 mmol/L 4.2 3.8 3.9  Chloride 98 - 111 mmol/L 113(H) 109 108  CO2 22 - 32 mmol/L 24 26 26   Calcium 8.9 - 10.3 mg/dL 8.8(L) 9.9 10.4  Total Protein 6.5 - 8.1 g/dL - 6.4(L) 6.2  Total Bilirubin 0.3 - 1.2 mg/dL - 1.4(H) 0.7  Alkaline Phos 38 - 126 U/L - 67 60  AST 15 - 41 U/L - 78(H) 25  ALT 0 - 44 U/L - 71(H) 32     Imaging studies:   RUQ [place] (11/07/2020) personally reviewed showing cholelithiasis, and radiologist report reviewed:  IMPRESSION: Minimal cholelithiasis without cholecystitis. No other  abnormality seen in the right upper quadrant of the abdomen.  MRCP (11/07/2020) personally reviewed showing cholecystitis without choledocholithiasis, and radiologist report reviewed:  IMPRESSION: 1. MR findings consistent with acute calculus cholecystitis. 2. Normal caliber and course of the common bile duct. No common bile duct stones. 3. Left hepatic lobe cyst   Assessment/Plan: (ICD-10's: K81.0) 65 y.o. female with acute calculous cholecystitis   - Admit to general surgery  - Will plan on robotic assisted laparoscopic cholecystectomy this afternoon with Dr Dahlia Byes pending OR/Anesthesia availability. Will ask cardiology for clearance as well.    - All risks, benefits, and alternatives to above procedure(s) were discussed with the patient, all of her questions were answered to her expressed satisfaction, patient expresses she wishes to proceed, and informed consent was obtained.  - NPO + IVF resuscitation  - IV Abx (Rocephin)  - Monitor abdominal examination  - Pain control prn; antiemetics prn   - DVT prophylaxis; hold for OR  All of the above findings and recommendations were discussed with the patient and her son at bedside, and all of their questions were answered to their expressed satisfaction.  -- Edison Simon, PA-C [facility] 11/08/2020, 7:21 AM [phone] M-F: 7am - 4pm

## 2020-11-08 NOTE — Consult Note (Signed)
Cardiology Consultation:   [name] MRN: [id number]; DOB: [birth year]  Admit date: 11/07/2020 Date of Consult: 11/08/2020  PCP:  Mary Pheasant, MD   [facility] Providers Cardiologist:  Mary Rogue, MD        Patient Profile:   Mary Walter is a 65 y.o. female with a hx of breast cancer, nonobstructive CAD, LVH who is being seen 11/08/2020 for the evaluation of epigastric pain at the request of Dr. Dahlia Walter.  History of Present Illness:   [name] is a 65 year old female with history of nonobstructive CAD, LVH, presenting with epigastric pain.  Symptoms started yesterday while patient was at home.  Epigastric pain radiated to her right flank.  She denies chest pain or shortness of breath.  Denies prior symptoms of right upper quadrant pain.  Due to acute/worsening symptoms, patient presented to the ED.  Work-up so far this admission with MRCP revealed acute cholecystitis.  Surgical management is being planned.  EKG in ED showed sinus rhythm, right bundle branch block, minimally elevated 21, 22.  Currently denies abdominal discomfort upon my exam.  Started on IV antibiotics.   Past Medical History:  Diagnosis Date  . Breast cancer ([place])   . Chest pain    a. 10/2015 MV: abnl ECG w/ inf/antlat ST depression w/ exercise (in setting of LVH @ baseline). No ischemia/infarct.  Marland Kitchen Dyspnea    WITH EXERTION DUE TO ENLARGED HEART  . Family history of adverse reaction to anesthesia    SON-LUNGS FILLED UP WITH FLUID AFTER APPENDECTOMY  . Hematuria   . Hypercholesterolemia   . Hypertension   . Hypertrophic cardiomegaly    a. 10/2019 Echo: EF 70-75%, mod LVH. No rwma. Mild systolic anterior motion of the mitral valve with mild LVOT obstruction (peak gradient 20 mmHg at rest and 26 mmHg with Valsalva).  Normal RV systolic function.  Normal PASP.  Mildly dilated LA.  Marland Kitchen Personal history of radiation therapy   . Systolic murmur     Past Surgical History:   Procedure Laterality Date  . BREAST BIOPSY Left 10/27/2019   Affirm bx-distortion-     clip-path ADH  . BREAST BIOPSY Left 01/07/2020   Affim bx 1 area/ x clip and ribbon clip/ ADH  . BREAST LUMPECTOMY    . BREAST LUMPECTOMY WITH NEEDLE LOCALIZATION Left 02/14/2020   Procedure: BREAST LUMPECTOMY WITH NEEDLE LOCALIZATION;  Surgeon: Mary Bellow, MD;  Location: [facility] ORS;  Service: General;  Laterality: Left;  . FACIAL FRACTURE SURGERY     X 2     Home Medications:  Prior to Admission medications   Medication Sig Start Date End Date Taking? Authorizing Provider  aspirin EC 81 MG EC tablet Take 1 tablet (81 mg total) by mouth daily. 11/21/19   Loletha Grayer, MD  atorvastatin (LIPITOR) 40 MG tablet Take 1 tablet (40 mg total) by mouth daily. 07/17/20   Minna Merritts, MD  metoprolol succinate (TOPROL XL) 25 MG 24 hr tablet Take 1 tablet (25 mg total) by mouth daily. 05/01/20   Minna Merritts, MD  Multiple Vitamin (MULTIVITAMIN) tablet Take 1 tablet by mouth daily.    [provider]  sertraline (ZOLOFT) 50 MG tablet Take 1 tablet by mouth once daily 09/20/20   Mary Pheasant, MD  tamoxifen (NOLVADEX) 20 MG tablet Take 20 mg by mouth daily. 06/08/20   [provider]  Vitamin D, Cholecalciferol, 10 MCG (400 UNIT) CAPS Take by mouth.    [provider]    Inpatient Medications: Scheduled Meds: . acetaminophen  1,000 mg Oral Q6H  . heparin  5,000 Units Subcutaneous Q8H  . indocyanine green  7.5 mg Intravenous On Call to OR  . metoprolol succinate  25 mg Oral Daily  . pantoprazole (PROTONIX) IV  40 mg Intravenous QHS  . sertraline  50 mg Oral Daily   Continuous Infusions: . sodium chloride 125 mL/hr at 11/07/20 2325  . cefTRIAXone (ROCEPHIN)  IV Stopped (11/08/20 0048)   PRN Meds: diphenhydrAMINE **OR** diphenhydrAMINE, ketorolac, melatonin, morphine injection, ondansetron **OR** ondansetron (ZOFRAN) IV, oxyCODONE, prochlorperazine **OR**  prochlorperazine  Allergies:    Allergies  Allergen Reactions  . Macrobid [Nitrofurantoin Macrocrystal]     unknown  . Penicillins     Childhood allergy    Social History:   Social History   Socioeconomic History  . Marital status: Married    Spouse name: Not on file  . Number of children: 2  . Years of education: Not on file  . Highest education level: Not on file  Occupational History  . Not on file  Tobacco Use  . Smoking status: Never Smoker  . Smokeless tobacco: Never Used  Vaping Use  . Vaping Use: Never used  Substance and Sexual Activity  . Alcohol use: No    Alcohol/week: 0.0 standard drinks  . Drug use: No  . Sexual activity: Not Currently  Other Topics Concern  . Not on file  Social History Narrative  . Not on file   Social Determinants of Health   Financial Resource Strain: Not on file  Food Insecurity: Not on file  Transportation Needs: Not on file  Physical Activity: Not on file  Stress: Not on file  Social Connections: Not on file  Intimate Partner Violence: Not on file    Family History:    Family History  Problem Relation Age of Onset  . Diabetes Mother   . Heart disease Mother   . Hypertension Mother   . Cancer Father        prostate   . Diverticulitis Father   . CVA Father   . Heart disease Father   . Stroke Father   . Hypertension Father   . Heart murmur Father   . Heart attack Brother   . CVA Paternal Grandmother   . Breast cancer Cousin      ROS:  Please see the history of present illness.   All other ROS reviewed and negative.     Physical Exam/Data:   Vitals:   11/08/20 0150 11/08/20 0300 11/08/20 0416 11/08/20 0744  BP: (!) 148/62  132/60 (!) 141/59  Pulse: 66  64 63  Resp: 18  16 18   Temp: 97.7 F (36.5 C)  97.8 F (36.6 C) 98.3 F (36.8 C)  TempSrc: Oral   Oral  SpO2: 99%  99% 96%  Weight:  71.2 kg    Height:        Intake/Output Summary (Last 24 hours) at 11/08/2020 1030 Last data filed at 11/08/2020  0528 Gross per 24 hour  Intake 856.25 ml  Output --  Net 856.25 ml   Last 3 Weights 11/08/2020 11/07/2020 10/31/2020  Weight (lbs) 156 lb 15.5 oz 166 lb 166 lb  Weight (kg) 71.2 kg 75.297 kg 75.297 kg     Body mass index is 26.12 kg/m.  General:  Well nourished, well developed, in no acute distress HEENT: normal Lymph: no adenopathy Neck: no JVD Endocrine:  No thryomegaly Vascular: No carotid  bruits; FA pulses 2+ bilaterally without bruits  Cardiac:  normal S1, S2; RRR; no murmur  Lungs:  clear to auscultation bilaterally, no wheezing, rhonchi or rales  Abd: soft, nontender, no hepatomegaly  Ext: no edema Musculoskeletal:  No deformities, BUE and BLE strength normal and equal Skin: warm and dry  Neuro:  CNs 2-12 intact, no focal abnormalities noted Psych:  Normal affect   EKG:  The EKG was personally reviewed and demonstrates: Sinus rhythm, right bundle branch block Telemetry:  Telemetry was personally reviewed and demonstrates: Not currently on telemetry.  Relevant CV Studies: Echocardiogram 10/2020 1. Left ventricular ejection fraction, by estimation, is 70 to 75%. The  left ventricle has hyperdynamic function. The left ventricle has no  regional wall motion abnormalities. There is moderate left ventricular  hypertrophy. Left ventricular diastolic  parameters are indeterminate. Mild systolic anterior motion of the mitral  valve with mild LVOT obstruction. Peak gradient of 20 mm Hg at rest and 26  mm Hg with Valsalva.  2. Right ventricular systolic function is normal. The right ventricular  size is normal. There is normal pulmonary artery systolic pressure.  3. Left atrial size was mildly dilated.  4. The mitral valve is normal in structure. No evidence of mitral valve  regurgitation. No evidence of mitral stenosis.  5. The aortic valve is normal in structure. Aortic valve regurgitation is  not visualized. No aortic stenosis is present.  6. The inferior vena cava is  normal in size with greater than 50%  respiratory variability, suggesting right atrial pressure of 3 mmHg.   Laboratory Data:  High Sensitivity Troponin:   Recent Labs  Lab 11/07/20 1124 11/07/20 1427  TROPONINIHS 21* 22*     Chemistry Recent Labs  Lab 11/07/20 1124 11/08/20 0520  NA 140 142  K 3.8 4.2  CL 109 113*  CO2 26 24  GLUCOSE 112* 86  BUN 15 13  CREATININE 0.81 0.90  CALCIUM 9.9 8.8*  GFRNONAA >60 >60  ANIONGAP 5 5    Recent Labs  Lab 11/07/20 1124  PROT 6.4*  ALBUMIN 3.9  AST 78*  ALT 71*  ALKPHOS 67  BILITOT 1.4*   Hematology Recent Labs  Lab 11/07/20 1124 11/08/20 0520  WBC 11.6* 4.9  RBC 4.46 3.98  HGB 13.6 12.2  HCT 39.9 35.8*  MCV 89.5 89.9  MCH 30.5 30.7  MCHC 34.1 34.1  RDW 13.5 13.3  PLT 142* 112*   BNPNo results for input(s): BNP, PROBNP in the last 168 hours.  DDimer No results for input(s): DDIMER in the last 168 hours.   Radiology/Studies:  DG Chest 2 View  Result Date: 11/07/2020 CLINICAL DATA:  65 year old female with chest and epigastric pain. EXAM: CHEST - 2 VIEW COMPARISON:  Cardiac CT 12/16/2019 and chest radiographs 11/19/2019. FINDINGS: Lung volumes and mediastinal contours are stable and within normal limits. Visualized tracheal air column is within normal limits. Stable lung markings since last year. No pneumothorax, pulmonary edema, pleural effusion or confluent pulmonary opacity. No acute osseous abnormality identified. Paucity of bowel gas in the upper abdomen. IMPRESSION: No acute cardiopulmonary abnormality. Electronically Signed   By: Genevie Ann M.D.   On: 11/07/2020 11:59   MR 3D Recon At Scanner  Result Date: 11/07/2020 CLINICAL DATA:  Cholelithiasis and elevated liver function studies. EXAM: MRI ABDOMEN WITHOUT AND WITH CONTRAST (INCLUDING MRCP) TECHNIQUE: Multiplanar multisequence MR imaging of the abdomen was performed both before and after the administration of intravenous contrast. Heavily T2-weighted images of  the biliary and pancreatic ducts were obtained, and three-dimensional MRCP images were rendered by post processing. Multiplanar multisequence MR imaging of the abdomen was performed without intravenous contrast. Heavily T2-weighted images of the biliary and pancreatic ducts were obtained, and three-dimensional MRCP images were rendered by post processing. CONTRAST:  76mL GADAVIST GADOBUTROL 1 MMOL/ML IV SOLN COMPARISON:  Ultrasound 11/07/2020 FINDINGS: Lower chest: The lung bases are grossly clear. Small pleural effusions but no pulmonary lesions or pericardial effusion. Hepatobiliary: Left hepatic lobe cyst is noted. No worrisome hepatic lesions or intrahepatic biliary dilatation. Gallbladder demonstrates layering gallstones. There is also significant gallbladder wall thickening and pericholecystic fluid consistent with acute cholecystitis. Normal caliber and course of the common bile duct. No common bile duct stones. Pancreas:  No mass, inflammation or ductal dilatation. Spleen:  Normal size.  No focal lesions. Adrenals/Urinary Tract: Adrenal glands and kidneys are unremarkable. Simple right renal cyst. Stomach/Bowel: The stomach, duodenum, small bowel and colon are unremarkable. Vascular/Lymphatic: The aorta and branch vessels are patent. Major venous structures are patent. No mesenteric or retroperitoneal mass or adenopathy. Other:  No ascites or abdominal wall hernia. Musculoskeletal: No significant bony findings. IMPRESSION: 1. MR findings consistent with acute calculus cholecystitis. 2. Normal caliber and course of the common bile duct. No common bile duct stones. 3. Left hepatic lobe cyst. Electronically Signed   By: Marijo Sanes M.D.   On: 11/07/2020 19:01   MR ABDOMEN MRCP W WO CONTAST  Result Date: 11/07/2020 CLINICAL DATA:  Cholelithiasis and elevated liver function studies. EXAM: MRI ABDOMEN WITHOUT AND WITH CONTRAST (INCLUDING MRCP) TECHNIQUE: Multiplanar multisequence MR imaging of the abdomen was  performed both before and after the administration of intravenous contrast. Heavily T2-weighted images of the biliary and pancreatic ducts were obtained, and three-dimensional MRCP images were rendered by post processing. Multiplanar multisequence MR imaging of the abdomen was performed without intravenous contrast. Heavily T2-weighted images of the biliary and pancreatic ducts were obtained, and three-dimensional MRCP images were rendered by post processing. CONTRAST:  52mL GADAVIST GADOBUTROL 1 MMOL/ML IV SOLN COMPARISON:  Ultrasound 11/07/2020 FINDINGS: Lower chest: The lung bases are grossly clear. Small pleural effusions but no pulmonary lesions or pericardial effusion. Hepatobiliary: Left hepatic lobe cyst is noted. No worrisome hepatic lesions or intrahepatic biliary dilatation. Gallbladder demonstrates layering gallstones. There is also significant gallbladder wall thickening and pericholecystic fluid consistent with acute cholecystitis. Normal caliber and course of the common bile duct. No common bile duct stones. Pancreas:  No mass, inflammation or ductal dilatation. Spleen:  Normal size.  No focal lesions. Adrenals/Urinary Tract: Adrenal glands and kidneys are unremarkable. Simple right renal cyst. Stomach/Bowel: The stomach, duodenum, small bowel and colon are unremarkable. Vascular/Lymphatic: The aorta and branch vessels are patent. Major venous structures are patent. No mesenteric or retroperitoneal mass or adenopathy. Other:  No ascites or abdominal wall hernia. Musculoskeletal: No significant bony findings. IMPRESSION: 1. MR findings consistent with acute calculus cholecystitis. 2. Normal caliber and course of the common bile duct. No common bile duct stones. 3. Left hepatic lobe cyst. Electronically Signed   By: Marijo Sanes M.D.   On: 11/07/2020 19:01   US ABDOMEN LIMITED RUQ (LIVER/GB)  Result Date: 11/07/2020 CLINICAL DATA:  Acute right upper quadrant abdominal pain. EXAM: ULTRASOUND ABDOMEN  LIMITED RIGHT UPPER QUADRANT COMPARISON:  None. FINDINGS: Gallbladder: Minimal cholelithiasis. No gallbladder wall thickening or pericholecystic fluid is noted. No sonographic Murphy's sign is noted. Common bile duct: Diameter: 3 mm which is within normal limits. Liver: No focal  lesion identified. Within normal limits in parenchymal echogenicity. Portal vein is patent on color Doppler imaging with normal direction of blood flow towards the liver. Other: None. IMPRESSION: Minimal cholelithiasis without cholecystitis. No other abnormality seen in the right upper quadrant of the abdomen. Electronically Signed   By: Marijo Conception M.D.   On: 11/07/2020 14:43     Assessment and Plan:   1. Epigastric/right upper quadrant pain, acute cholecystitis -On IV antibiotics -Last echocardiogram with preserved ejection fraction, coronary CTA with mild nonobstructive LAD disease. -Currently asymptomatic from a cardiac perspective -Okay to proceed with surgical procedure from a cardiac perspective, no additional testing indicated -Recommend keeping patient hydrated.  2.  Nonobstructive CAD -Restart PTA aspirin, Lipitor, Toprol-XL as soon as safely possible postop.  3.  Elevated troponins -Not consistent with ACS -Patient asymptomatic -Aspirin, Lipitor as above  Thank you for this consult, please let us know if additional cardiac input is needed.  Total encounter time 110 minutes  Greater than 50% was spent in counseling and coordination of care with the patient   Risk Assessment/Risk Scores:    Signed, Kate Sable, MD  11/08/2020 10:30 AM

## 2020-11-08 NOTE — Anesthesia Procedure Notes (Signed)
Procedure Name: Intubation Performed by: Brenner Visconti, CRNA Pre-anesthesia Checklist: Patient identified, Patient being monitored, Timeout performed, Emergency Drugs available and Suction available Patient Re-evaluated:Patient Re-evaluated prior to induction Oxygen Delivery Method: Circle system utilized Preoxygenation: Pre-oxygenation with 100% oxygen Induction Type: IV induction Ventilation: Mask ventilation without difficulty Laryngoscope Size: 3 and McGraph Grade View: Grade I Tube type: Oral Tube size: 7.0 mm Number of attempts: 1 Airway Equipment and Method: Stylet and Video-laryngoscopy Placement Confirmation: ETT inserted through vocal cords under direct vision,  positive ETCO2 and breath sounds checked- equal and bilateral Secured at: 21 cm Tube secured with: Tape Dental Injury: Teeth and Oropharynx as per pre-operative assessment        

## 2020-11-08 NOTE — Care Management Obs Status (Signed)
[place] NOTIFICATION   Patient Details  Name: Mary Walter MRN: [id number] Date of Birth: 01/09/1956   Medicare Observation Status Notification Given:  Yes    Beverly Sessions, RN 11/08/2020, 4:56 PM

## 2020-11-08 NOTE — Care Management CC44 (Signed)
Condition Code 44 Documentation Completed  Patient Details  Name: Mary Walter MRN: [id number] Date of Birth: 1956/05/28   Condition Code 44 given:  Yes Patient signature on Condition Code 44 notice:  Yes Documentation of 2 MD's agreement:  Yes Code 44 added to claim:  Yes    Beverly Sessions, RN 11/08/2020, 4:56 PM

## 2020-11-08 NOTE — Op Note (Signed)
Robotic assisted laparoscopic Cholecystectomy  Pre-operative Diagnosis: Acute cholecystitis  Post-operative Diagnosis: same  Procedure:  Robotic assisted laparoscopic Cholecystectomy  Surgeon: Caroleen Hamman, MD FACS  Anesthesia: Gen. with endotracheal tube  Findings: Acute Cholecystitis   Estimated Blood Loss: 5cc       Specimens: Gallbladder           Complications: none   Procedure Details  The patient was seen again in the Holding Room. The benefits, complications, treatment options, and expected outcomes were discussed with the patient. The risks of bleeding, infection, recurrence of symptoms, failure to resolve symptoms, bile duct damage, bile duct leak, retained common bile duct stone, bowel injury, any of which could require further surgery and/or ERCP, stent, or papillotomy were reviewed with the patient. The likelihood of improving the patient's symptoms with return to their baseline status is good.  The patient and/or family concurred with the proposed plan, giving informed consent.  The patient was taken to Operating Room, identified  and the procedure verified as Laparoscopic Cholecystectomy.  A Time Out was held and the above information confirmed.  Prior to the induction of general anesthesia, antibiotic prophylaxis was administered. VTE prophylaxis was in place. General endotracheal anesthesia was then administered and tolerated well. After the induction, the abdomen was prepped with Chloraprep and draped in the sterile fashion. The patient was positioned in the supine position.  Cut down technique was used to enter the abdominal cavity and a Hasson trochar was placed after two vicryl stitches were anchored to the fascia. Pneumoperitoneum was then created with CO2 and tolerated well without any adverse changes in the patient's vital signs.  Three 8-mm ports were placed under direct vision. All skin incisions  were infiltrated with a local anesthetic agent before making the  incision and placing the trocars.   The patient was positioned  in reverse Trendelenburg, robot was brought to the surgical field and docked in the standard fashion.  We made sure all the instrumentation was kept indirect view at all times and that there were no collision between the arms. I scrubbed out and went to the console.  The gallbladder was identified, the fundus grasped and retracted cephalad. Adhesions were lysed bluntly. The infundibulum was grasped and retracted laterally, exposing the peritoneum overlying the triangle of Calot. This was then divided and exposed in a blunt fashion. An extended critical view of the cystic duct and cystic artery was obtained.  The cystic duct was clearly identified and bluntly dissected.   Artery and duct were double clipped and divided. Using ICG cholangiography we visualize the cystic duct and CBD, no evidence of bile injuries were observed. The gallbladder was taken from the gallbladder fossa in a retrograde fashion with the electrocautery.  Hemostasis was achieved with the electrocautery. nspection of the right upper quadrant was performed. No bleeding, bile duct injury or leak, or bowel injury was noted. Robotic instruments and robotic arms were undocked in the standard fashion.  I scrubbed back in.  The gallbladder was removed and placed in an Endocatch bag.   Pneumoperitoneum was released.  The periumbilical port site was closed with interrumpted 0 Vicryl sutures. 4-0 subcuticular Monocryl was used to close the skin. Dermabond was  applied.  The patient was then extubated and brought to the recovery room in stable condition. Sponge, lap, and needle counts were correct at closure and at the conclusion of the case.               Caroleen Hamman, MD,  FACS

## 2020-11-08 NOTE — Anesthesia Preprocedure Evaluation (Signed)
Anesthesia Evaluation  Patient identified by MRN, date of birth, ID band Patient awake    Reviewed: Allergy & Precautions, H&P , NPO status , Patient's Chart, lab work & pertinent test results, reviewed documented beta blocker date and time   History of Anesthesia Complications Negative for: history of anesthetic complications  Airway Mallampati: II  TM Distance: >3 FB Neck ROM: full    Dental  (+) Dental Advidsory Given, Teeth Intact, Caps, Missing   Pulmonary shortness of breath and with exertion, neg sleep apnea, neg COPD, neg recent URI,    Pulmonary exam normal breath sounds clear to auscultation       Cardiovascular Exercise Tolerance: Good hypertension, (-) angina(-) CAD, (-) Past MI and (-) Cardiac Stents Normal cardiovascular exam+ dysrhythmias (Incomplete RBBB) + Valvular Problems/Murmurs  Rhythm:regular Rate:Normal     Neuro/Psych PSYCHIATRIC DISORDERS Depression negative neurological ROS     GI/Hepatic negative GI ROS, Neg liver ROS,   Endo/Other  negative endocrine ROS  Renal/GU negative Renal ROS  negative genitourinary   Musculoskeletal   Abdominal   Peds  Hematology negative hematology ROS (+)   Anesthesia Other Findings Past Medical History: No date: Chest pain     Comment:  a. 10/2015 MV: abnl ECG w/ inf/antlat ST depression w/               exercise (in setting of LVH @ baseline). No               ischemia/infarct. No date: Dyspnea     Comment:  WITH EXERTION DUE TO ENLARGED HEART No date: Family history of adverse reaction to anesthesia     Comment:  SON-LUNGS FILLED UP WITH FLUID AFTER APPENDECTOMY No date: Hematuria No date: Hypercholesterolemia No date: Hypertension No date: Hypertrophic cardiomegaly     Comment:  a. 10/2019 Echo: EF 70-75%, mod LVH. No rwma. Mild               systolic anterior motion of the mitral valve with mild               LVOT obstruction (peak gradient 20 mmHg at  rest and 26               mmHg with Valsalva).  Normal RV systolic function.                Normal PASP.  Mildly dilated LA. No date: Systolic murmur   Reproductive/Obstetrics negative OB ROS                             Anesthesia Physical Anesthesia Plan  ASA: II  Anesthesia Plan: General   Post-op Pain Management:    Induction: Intravenous  PONV Risk Score and Plan: 3 and Ondansetron, Dexamethasone, Midazolam and Treatment may vary due to age or medical condition  Airway Management Planned: Oral ETT  Additional Equipment:   Intra-op Plan:   Post-operative Plan: Extubation in OR  Informed Consent:   Plan Discussed with:   Anesthesia Plan Comments:         Anesthesia Quick Evaluation

## 2020-11-08 NOTE — Transfer of Care (Signed)
Immediate Anesthesia Transfer of Care Note  Patient: Mary Walter  Procedure(s) Performed: XI ROBOTIC ASSISTED LAPAROSCOPIC CHOLECYSTECTOMY (N/A Abdomen)  Patient Location: PACU  Anesthesia Type:General  Level of Consciousness: awake  Airway & Oxygen Therapy: Patient Spontanous Breathing and Patient connected to face mask oxygen  Post-op Assessment: Report given to RN and Post -op Vital signs reviewed and stable  Post vital signs: Reviewed  Last Vitals:  Vitals Value Taken Time  BP    Temp    Pulse    Resp    SpO2      Last Pain:  Vitals:   11/08/20 1224  TempSrc:   PainSc: 0-No pain         Complications: No complications documented.

## 2020-11-09 MED ORDER — OXYCODONE HCL 5 MG PO TABS: 5.0000 mg | ORAL_TABLET | ORAL | 0 refills | Status: DC | PRN

## 2020-11-09 MED ORDER — IBUPROFEN 600 MG PO TABS: 600.0000 mg | ORAL_TABLET | Freq: Four times a day (QID) | ORAL | 0 refills | Status: DC | PRN

## 2020-11-09 NOTE — Progress Notes (Signed)
Patient discharged to home via wheelchair.  Discharge instructions given to patient, patient able to verbalize understand and teach back.  IV sites d/ced.  Patient discharged with all pertinent information, prescriptions, and personal belongings.  No acute distress noted. Care relinquished.

## 2020-11-09 NOTE — Discharge Summary (Signed)
[facility] SURGICAL DISCHARGE SUMMARY  [name] MRN: [id number] DOB/AGE: [birth year] 65 y.o.  Admit date: 11/07/2020 Discharge date: 11/09/2020  Discharge Diagnoses Patient Active Problem List   Diagnosis Date Noted  . Acute cholecystitis 11/07/2020    Consultants Cardiology  Procedures 11/08/2020:  Robotic assisted laparoscopic cholecystectomy  HPI: 65 y.o. female presented to [facility] ED today for chest pain. Patient reported around a 24 hour history of chest / epigastric pain. She thought initially she was having indigestion but the pain was more severe than normal. This did radiate towards her back. Only additional associated symptom reported is nausea. No fever, chills, cough, SOB, emesis. Urinary changes, or bowel changes. No previous intra-abdominal surgeries. Work up int he ED revealed a leukocytosis to 11.6K and mild hyperbilirubinemia to 1.4. She would undergo RUQ [place] and MRCP in the ED which was concerning for acute cholecystitis without choledocholithiasis.    Hospital Course: Informed consent was obtained and documented, and patient underwent uneventful robotic assisted laparoscopic cholecystectomy (Dr Dahlia Byes, 11/08/2020).  Post-operatively, patient's pain/symptoms improved/resolved and advancement of patient's diet and ambulation were well-tolerated. The remainder of patient's hospital course was essentially unremarkable, and discharge planning was initiated accordingly with patient safely able to be discharged home with appropriate discharge instructions, pain control, and outpatient follow-up after all of her questions were answered to her expressed satisfaction.   Discharge Condition: Good   Physical Examination:  Constitutional: Well appearing female, NAD Pulmonary: Normal effort, no respiratory distress Gastrointestinal: Soft, incisional soreness, non-distended, no rebound/guarding Skin: Laparoscopic incisions are CDI with  dermabond, no erythema or drainage    Allergies as of 11/09/2020      Reactions   Macrobid [nitrofurantoin Macrocrystal]    unknown   Penicillins    Childhood allergy      Medication List    TAKE these medications   aspirin 81 MG EC tablet Take 1 tablet (81 mg total) by mouth daily.   atorvastatin 40 MG tablet Commonly known as: LIPITOR Take 1 tablet (40 mg total) by mouth daily.   ibuprofen 600 MG tablet Commonly known as: ADVIL Take 1 tablet (600 mg total) by mouth every 6 (six) hours as needed.   metoprolol succinate 25 MG 24 hr tablet Commonly known as: Toprol XL Take 1 tablet (25 mg total) by mouth daily.   multivitamin tablet Take 1 tablet by mouth daily.   oxyCODONE 5 MG immediate release tablet Commonly known as: Oxy IR/ROXICODONE Take 1-2 tablets (5-10 mg total) by mouth every 4 (four) hours as needed for moderate pain.   sertraline 50 MG tablet Commonly known as: ZOLOFT Take 1 tablet by mouth once daily   tamoxifen 20 MG tablet Commonly known as: NOLVADEX Take 20 mg by mouth daily.   Vitamin D (Cholecalciferol) 10 MCG (400 UNIT) Caps Take by mouth.         Follow-up Information    Tylene Fantasia, PA-C. Schedule an appointment as soon as possible for a visit in 2 week(s).   Specialty: Physician Assistant Why: s/p laparoscopic cholecystectomy  Contact information: [address] [place] [address] [phone]                Time spent on discharge management including discussion of hospital course, clinical condition, outpatient instructions, prescriptions, and follow up with the patient and members of the medical team: >30 minutes  -- Edison Simon , PA-C [facility]  11/09/2020, 8:31 AM [phone] M-F: 7am - 4pm

## 2020-11-09 NOTE — Plan of Care (Signed)

## 2020-11-09 NOTE — Discharge Instructions (Signed)
In addition to included general post-operative instructions,  Diet: Resume home diet. Recommend avoiding or limiting fatty/greasy foods over the next few days/week. If you do eat these, you may (or may not) notice diarrhea. This is expected while your body adjusts to not having a gallbladder, and it typically resolves with time.    Activity: No heavy lifting >20 pounds (children, pets, laundry, garbage) for 4 weeks, but light activity and walking are encouraged. Do not drive or drink alcohol if taking narcotic pain medications or having pain that might distract from driving.  Wound care: 2 days after surgery (05/20), you may shower/get incision wet with soapy water and pat dry (do not rub incisions), but no baths or submerging incision underwater until follow-up.   Medications: Resume all home medications. For mild to moderate pain: acetaminophen (Tylenol) or ibuprofen/naproxen (if no kidney disease). Combining Tylenol with alcohol can substantially increase your risk of causing liver disease. Narcotic pain medications, if prescribed, can be used for severe pain, though may cause nausea, constipation, and drowsiness. Do not combine Tylenol and Percocet (or similar) within a 6 hour period as Percocet (and similar) contain(s) Tylenol. If you do not need the narcotic pain medication, you do not need to fill the prescription.  Call office [phone] / [phone]) at any time if any questions, worsening pain, fevers/chills, bleeding, drainage from incision site, or other concerns.

## 2020-11-10 ENCOUNTER — Ambulatory Visit: Payer: Medicare HMO | Admitting: Nurse Practitioner

## 2020-11-10 ENCOUNTER — Telehealth: Payer: Self-pay

## 2020-11-10 LAB — SURGICAL PATHOLOGY

## 2020-11-10 NOTE — Telephone Encounter (Signed)
Transition Care Management Unsuccessful Follow-up Telephone Call  Date of discharge and from where:  11/09/20 from [facility]  Attempts:  1st Attempt  Reason for unsuccessful TCM follow-up call:  Unable to reach patient

## 2020-11-11 ENCOUNTER — Telehealth: Payer: Self-pay | Admitting: Internal Medicine

## 2020-11-11 NOTE — Telephone Encounter (Signed)
My chart message sent to Ms Hammer for update.

## 2020-11-13 NOTE — Telephone Encounter (Signed)
Transition Care Management Unsuccessful Follow-up Telephone Call  Date of discharge and from where:  11/09/20 from [facility]  Attempts:  2nd Attempt  Reason for unsuccessful TCM follow-up call:  Unable to leave message. No answer. Will follow as appropriate.

## 2020-11-14 NOTE — Telephone Encounter (Signed)
Transition Care Management Unsuccessful Follow-up Telephone Call  Date of discharge and from where:  11/09/20 from [facility]  Attempts:  3rd Attempt  Reason for unsuccessful TCM follow-up call:  Unable to reach patient

## 2020-11-17 ENCOUNTER — Telehealth: Payer: Self-pay | Admitting: Internal Medicine

## 2020-11-17 NOTE — Telephone Encounter (Signed)
A nurse from [facility] reached out to advise that the PT had been in the hospital on May 19. She stated she been trying to reach out to PT to advise them to see their Dr for a followup. Nurse advise me to inform the nurse of this.

## 2020-11-17 NOTE — Telephone Encounter (Signed)
Patient stated that she has a f/u with her surgeon but she has appt scheduled with Dr Nicki Reaper in July and is doing fine. Did not feel like she needed to move appt up.

## 2020-11-21 ENCOUNTER — Encounter: Payer: Self-pay | Admitting: Physician Assistant

## 2020-11-21 ENCOUNTER — Other Ambulatory Visit: Payer: Self-pay

## 2020-11-21 ENCOUNTER — Ambulatory Visit (INDEPENDENT_AMBULATORY_CARE_PROVIDER_SITE_OTHER): Payer: Medicare HMO | Admitting: Physician Assistant

## 2020-11-21 VITALS — BP 134/77 | HR 62 | Temp 98.5°F | Ht 65.0 in | Wt 161.2 lb

## 2020-11-21 DIAGNOSIS — K810 Acute cholecystitis: Secondary | ICD-10-CM

## 2020-11-21 DIAGNOSIS — Z09 Encounter for follow-up examination after completed treatment for conditions other than malignant neoplasm: Secondary | ICD-10-CM

## 2020-11-21 NOTE — Patient Instructions (Addendum)

## 2020-11-21 NOTE — Progress Notes (Signed)
[facility] POST-OP OFFICE VISIT  11/21/2020  HPI: Mary Walter is a 65 y.o. female 13 days s/p robotic assisted laparoscopic cholecystectomy for acute cholecystitis with Dr Dahlia Byes  She has done well since her surgery She has some incisional soreness, mostly with movement No fever, chills, nausea, emesis, or diarrhea No issues with the incisions themselves Otherwise no complaints  Vital signs: BP 134/77   Pulse 62   Temp 98.5 F (36.9 C) (Oral)   Ht 5\' 5"  (1.651 m)   Wt 161 lb 3.2 oz (73.1 kg)   LMP 06/24/2010   SpO2 96%   BMI 26.83 kg/m    Physical Exam: Constitutional: Well appearing female, NAD Abdomen: Soft, non-tender, non-distended, no rebound/guarding Skin: Laparoscopic incisions have healed well, there is ecchymosis at the umbilicus, no erythema or drainage  Assessment/Plan: This is a 65 y.o. female 13 days s/p robotic assisted laparoscopic cholecystectomy for acute cholecystitis   - Reviewed pain control; prn with OTC medication  - Reviewed lifting restrictions; 4 weeks total  - Reviewed pathology: [place]  - She will return to clinic on as needed basis   -- Edison Simon, PA-C  [facility] 11/21/2020, 3:42 PM [phone] M-F: 7am - 4pm

## 2020-11-23 ENCOUNTER — Ambulatory Visit: Payer: Medicare HMO | Admitting: Radiation Oncology

## 2020-12-18 ENCOUNTER — Other Ambulatory Visit: Payer: Self-pay | Admitting: Internal Medicine

## 2021-01-09 ENCOUNTER — Other Ambulatory Visit: Payer: Self-pay

## 2021-01-09 ENCOUNTER — Other Ambulatory Visit (INDEPENDENT_AMBULATORY_CARE_PROVIDER_SITE_OTHER): Payer: Medicare HMO

## 2021-01-09 DIAGNOSIS — R319 Hematuria, unspecified: Secondary | ICD-10-CM | POA: Diagnosis not present

## 2021-01-09 DIAGNOSIS — E785 Hyperlipidemia, unspecified: Secondary | ICD-10-CM | POA: Diagnosis not present

## 2021-01-09 DIAGNOSIS — I10 Essential (primary) hypertension: Secondary | ICD-10-CM | POA: Diagnosis not present

## 2021-01-09 LAB — LIPID PANEL
Cholesterol: 104 mg/dL (ref 0–200)
HDL: 40.1 mg/dL (ref >39.00)
LDL Cholesterol: 46 mg/dL (ref 0–99)
NonHDL: 64.06
Total CHOL/HDL Ratio: 3
Triglycerides: 88 mg/dL (ref 0.0–149.0)
VLDL: 17.6 mg/dL (ref 0.0–40.0)

## 2021-01-09 LAB — URINALYSIS, ROUTINE W REFLEX MICROSCOPIC
Bilirubin Urine: NEGATIVE
Ketones, ur: NEGATIVE
Nitrite: NEGATIVE
Specific Gravity, Urine: 1.02 (ref 1.000–1.030)
Total Protein, Urine: NEGATIVE
Urine Glucose: NEGATIVE
Urobilinogen, UA: 0.2 (ref 0.0–1.0)
pH: 6 (ref 5.0–8.0)

## 2021-01-09 LAB — BASIC METABOLIC PANEL
BUN: 15 mg/dL (ref 6–23)
CO2: 27 mEq/L (ref 19–32)
Calcium: 9.6 mg/dL (ref 8.4–10.5)
Chloride: 111 mEq/L (ref 96–112)
Creatinine, Ser: 0.84 mg/dL (ref 0.40–1.20)
GFR: 72.56 mL/min (ref >60.00)
Glucose, Bld: 89 mg/dL (ref 70–99)
Potassium: 3.9 mEq/L (ref 3.5–5.1)
Sodium: 143 mEq/L (ref 135–145)

## 2021-01-09 LAB — HEPATIC FUNCTION PANEL
ALT: 35 U/L (ref 0–35)
AST: 28 U/L (ref 0–37)
Albumin: 3.9 g/dL (ref 3.5–5.2)
Alkaline Phosphatase: 62 U/L (ref 39–117)
Bilirubin, Direct: 0.2 mg/dL (ref 0.0–0.3)
Total Bilirubin: 0.7 mg/dL (ref 0.2–1.2)
Total Protein: 6.2 g/dL (ref 6.0–8.3)

## 2021-01-11 ENCOUNTER — Other Ambulatory Visit: Payer: Self-pay

## 2021-01-11 ENCOUNTER — Ambulatory Visit (INDEPENDENT_AMBULATORY_CARE_PROVIDER_SITE_OTHER): Payer: Medicare HMO | Admitting: Internal Medicine

## 2021-01-11 ENCOUNTER — Encounter: Payer: Self-pay | Admitting: Internal Medicine

## 2021-01-11 DIAGNOSIS — I421 Obstructive hypertrophic cardiomyopathy: Secondary | ICD-10-CM | POA: Diagnosis not present

## 2021-01-11 DIAGNOSIS — I10 Essential (primary) hypertension: Secondary | ICD-10-CM | POA: Diagnosis not present

## 2021-01-11 DIAGNOSIS — Z23 Encounter for immunization: Secondary | ICD-10-CM | POA: Diagnosis not present

## 2021-01-11 DIAGNOSIS — I25118 Atherosclerotic heart disease of native coronary artery with other forms of angina pectoris: Secondary | ICD-10-CM | POA: Diagnosis not present

## 2021-01-11 DIAGNOSIS — R69 Illness, unspecified: Secondary | ICD-10-CM | POA: Diagnosis not present

## 2021-01-11 DIAGNOSIS — F439 Reaction to severe stress, unspecified: Secondary | ICD-10-CM | POA: Diagnosis not present

## 2021-01-11 DIAGNOSIS — D0512 Intraductal carcinoma in situ of left breast: Secondary | ICD-10-CM

## 2021-01-11 DIAGNOSIS — D696 Thrombocytopenia, unspecified: Secondary | ICD-10-CM

## 2021-01-11 DIAGNOSIS — E785 Hyperlipidemia, unspecified: Secondary | ICD-10-CM | POA: Diagnosis not present

## 2021-01-11 DIAGNOSIS — R319 Hematuria, unspecified: Secondary | ICD-10-CM

## 2021-01-11 NOTE — Progress Notes (Signed)
[name], female   DOB: 1956-05-28, 65 y.o.   MRN: [id number]   Subjective:    [name], female    DOB: [birth year], 65 y.o.   MRN: [id number]  HPI This visit occurred during the SARS-CoV-2 public health emergency.  Safety protocols were in place, including screening questions prior to the visit, additional usage of staff PPE, and extensive cleaning of exam room while observing appropriate contact time as indicated for disinfecting solutions.   Patient here for a scheduled follow up.  Here to follow up regarding increased stress and to f/u regarding her cholesterol.  He is status post cholecystectomy (May 2022).  Has done well since her surgery.  Has 1 bowel movement a day.  Maybe a little loose.  Does feel that this is getting better.  No abdominal pain.  No chest pain or shortness of breath.  She feels from a cardiac standpoint things are stable.  No nausea or vomiting.  Increased stress.  Discussed.  Feels she is doing well on Zoloft.  Desires not to change.   Past Medical History:  Diagnosis Date   Breast cancer ([place])    Chest pain    a. 10/2015 MV: abnl ECG w/ inf/antlat ST depression w/ exercise (in setting of LVH @ baseline). No ischemia/infarct.   Dyspnea    WITH EXERTION DUE TO ENLARGED HEART   Family history of adverse reaction to anesthesia    SON-LUNGS FILLED UP WITH FLUID AFTER APPENDECTOMY   Hematuria    Hypercholesterolemia    Hypertension    Hypertrophic cardiomegaly    a. 10/2019 Echo: EF 70-75%, mod LVH. No rwma. Mild systolic anterior motion of the mitral valve with mild LVOT obstruction (peak gradient 20 mmHg at rest and 26 mmHg with Valsalva).  Normal RV systolic function.  Normal PASP.  Mildly dilated LA.   Personal history of radiation therapy    Systolic murmur    Past Surgical History:  Procedure Laterality Date   BREAST BIOPSY Left 10/27/2019   Affirm bx-distortion-     clip-path ADH   BREAST BIOPSY Left 01/07/2020    Affim bx 1 area/ x clip and ribbon clip/ ADH   BREAST LUMPECTOMY     BREAST LUMPECTOMY WITH NEEDLE LOCALIZATION Left 02/14/2020   Procedure: BREAST LUMPECTOMY WITH NEEDLE LOCALIZATION;  Surgeon: Robert Bellow, MD;  Location: [facility] ORS;  Service: General;  Laterality: Left;   FACIAL FRACTURE SURGERY     X 2   Family History  Problem Relation Age of Onset   Diabetes Mother    Heart disease Mother    Hypertension Mother    Cancer Father        prostate    Diverticulitis Father    CVA Father    Heart disease Father    Stroke Father    Hypertension Father    Heart murmur Father    Heart attack Brother    CVA Paternal Grandmother    Breast cancer Cousin    Social History   Socioeconomic History   Marital status: Married    Spouse name: Not on file   Number of children: 2   Years of education: Not on file   Highest education level: Not on file  Occupational History   Not on file  Tobacco Use   Smoking status: Never   Smokeless tobacco: Never  Vaping Use   Vaping Use: Never used  Substance and Sexual Activity   Alcohol use: No  Alcohol/week: 0.0 standard drinks   Drug use: No   Sexual activity: Not Currently  Other Topics Concern   Not on file  Social History Narrative   Not on file   Social Determinants of Health   Financial Resource Strain: Not on file  Food Insecurity: Not on file  Transportation Needs: Not on file  Physical Activity: Not on file  Stress: Not on file  Social Connections: Not on file    Review of Systems  Constitutional:  Negative for appetite change and unexpected weight change.  HENT:  Negative for congestion and sinus pressure.   Respiratory:  Negative for cough, chest tightness and shortness of breath.   Cardiovascular:  Negative for chest pain, palpitations and leg swelling.  Gastrointestinal:  Negative for abdominal pain, nausea and vomiting.       No diarrhea. May have a loose stool when has a bowel movement.  Improving.    Genitourinary:  Negative for difficulty urinating and dysuria.  Musculoskeletal:  Negative for joint swelling and myalgias.  Skin:  Negative for color change and rash.  Neurological:  Negative for dizziness, light-headedness and headaches.  Psychiatric/Behavioral:  Negative for agitation and dysphoric mood.       Objective:    Physical Exam Vitals reviewed.  Constitutional:      General: She is not in acute distress.    Appearance: Normal appearance.  HENT:     Head: Normocephalic and atraumatic.     Right Ear: External ear normal.     Left Ear: External ear normal.  Eyes:     General: No scleral icterus.       Right eye: No discharge.        Left eye: No discharge.     Conjunctiva/sclera: Conjunctivae normal.  Neck:     Thyroid: No thyromegaly.  Cardiovascular:     Rate and Rhythm: Normal rate and regular rhythm.  Pulmonary:     Effort: No respiratory distress.     Breath sounds: Normal breath sounds. No wheezing.  Abdominal:     General: Bowel sounds are normal.     Palpations: Abdomen is soft.     Tenderness: There is no abdominal tenderness.  Musculoskeletal:        General: No swelling or tenderness.     Cervical back: Neck supple. No tenderness.  Lymphadenopathy:     Cervical: No cervical adenopathy.  Skin:    Findings: No erythema or rash.  Neurological:     Mental Status: She is alert.  Psychiatric:        Mood and Affect: Mood normal.        Behavior: Behavior normal.    BP 120/74   Pulse 71   Temp 98.7 F (37.1 C)   Ht 5\' 5"  (1.651 m)   Wt 165 lb 3.2 oz (74.9 kg)   LMP 06/24/2010   SpO2 97%   BMI 27.49 kg/m  Wt Readings from Last 3 Encounters:  01/11/21 165 lb 3.2 oz (74.9 kg)  11/21/20 161 lb 3.2 oz (73.1 kg)  11/08/20 156 lb 15.5 oz (71.2 kg)    Outpatient Encounter Medications as of 01/11/2021  Medication Sig   aspirin EC 81 MG EC tablet Take 1 tablet (81 mg total) by mouth daily.   atorvastatin (LIPITOR) 40 MG tablet Take 1 tablet (40  mg total) by mouth daily.   Calcium Carbonate-Vitamin D3 (CALCIUM + VITAMIN D3) 600-400 MG-UNIT TABS Take by mouth.   metoprolol succinate (TOPROL XL) 25  MG 24 hr tablet Take 1 tablet (25 mg total) by mouth daily.   Multiple Vitamin (MULTIVITAMIN) tablet Take 1 tablet by mouth daily.   sertraline (ZOLOFT) 50 MG tablet Take 1 tablet by mouth once daily   tamoxifen (NOLVADEX) 20 MG tablet Take 20 mg by mouth daily.   Vitamin D, Cholecalciferol, 10 MCG (400 UNIT) CAPS Take by mouth.   [DISCONTINUED] ibuprofen (ADVIL) 600 MG tablet Take 1 tablet (600 mg total) by mouth every 6 (six) hours as needed. (Patient not taking: Reported on 01/11/2021)   No facility-administered encounter medications on file as of 01/11/2021.     Lab Results  Component Value Date   WBC 4.9 11/08/2020   HGB 12.2 11/08/2020   HCT 35.8 (L) 11/08/2020   PLT 112 (L) 11/08/2020   GLUCOSE 89 01/09/2021   CHOL 104 01/09/2021   TRIG 88.0 01/09/2021   HDL 40.10 01/09/2021   LDLDIRECT 57 01/19/2020   LDLCALC 46 01/09/2021   ALT 35 01/09/2021   AST 28 01/09/2021   NA 143 01/09/2021   K 3.9 01/09/2021   CL 111 01/09/2021   CREATININE 0.84 01/09/2021   BUN 15 01/09/2021   CO2 27 01/09/2021   TSH 1.60 09/29/2020   INR 1.0 11/19/2019   HGBA1C 5.2 03/30/2013    DG Chest 2 View  Result Date: 11/07/2020 CLINICAL DATA:  64 year old female with chest and epigastric pain. EXAM: CHEST - 2 VIEW COMPARISON:  Cardiac CT 12/16/2019 and chest radiographs 11/19/2019. FINDINGS: Lung volumes and mediastinal contours are stable and within normal limits. Visualized tracheal air column is within normal limits. Stable lung markings since last year. No pneumothorax, pulmonary edema, pleural effusion or confluent pulmonary opacity. No acute osseous abnormality identified. Paucity of bowel gas in the upper abdomen. IMPRESSION: No acute cardiopulmonary abnormality. Electronically Signed   By: Genevie Ann M.D.   On: 11/07/2020 11:59   MR 3D Recon At  Scanner  Result Date: 11/07/2020 CLINICAL DATA:  Cholelithiasis and elevated liver function studies. EXAM: MRI ABDOMEN WITHOUT AND WITH CONTRAST (INCLUDING MRCP) TECHNIQUE: Multiplanar multisequence MR imaging of the abdomen was performed both before and after the administration of intravenous contrast. Heavily T2-weighted images of the biliary and pancreatic ducts were obtained, and three-dimensional MRCP images were rendered by post processing. Multiplanar multisequence MR imaging of the abdomen was performed without intravenous contrast. Heavily T2-weighted images of the biliary and pancreatic ducts were obtained, and three-dimensional MRCP images were rendered by post processing. CONTRAST:  35mL GADAVIST GADOBUTROL 1 MMOL/ML IV SOLN COMPARISON:  Ultrasound 11/07/2020 FINDINGS: Lower chest: The lung bases are grossly clear. Small pleural effusions but no pulmonary lesions or pericardial effusion. Hepatobiliary: Left hepatic lobe cyst is noted. No worrisome hepatic lesions or intrahepatic biliary dilatation. Gallbladder demonstrates layering gallstones. There is also significant gallbladder wall thickening and pericholecystic fluid consistent with acute cholecystitis. Normal caliber and course of the common bile duct. No common bile duct stones. Pancreas:  No mass, inflammation or ductal dilatation. Spleen:  Normal size.  No focal lesions. Adrenals/Urinary Tract: Adrenal glands and kidneys are unremarkable. Simple right renal cyst. Stomach/Bowel: The stomach, duodenum, small bowel and colon are unremarkable. Vascular/Lymphatic: The aorta and branch vessels are patent. Major venous structures are patent. No mesenteric or retroperitoneal mass or adenopathy. Other:  No ascites or abdominal wall hernia. Musculoskeletal: No significant bony findings. IMPRESSION: 1. MR findings consistent with acute calculus cholecystitis. 2. Normal caliber and course of the common bile duct. No common bile duct stones. 3.  Left hepatic  lobe cyst. Electronically Signed   By: Marijo Sanes M.D.   On: 11/07/2020 19:01   MR ABDOMEN MRCP W WO CONTAST  Result Date: 11/07/2020 CLINICAL DATA:  Cholelithiasis and elevated liver function studies. EXAM: MRI ABDOMEN WITHOUT AND WITH CONTRAST (INCLUDING MRCP) TECHNIQUE: Multiplanar multisequence MR imaging of the abdomen was performed both before and after the administration of intravenous contrast. Heavily T2-weighted images of the biliary and pancreatic ducts were obtained, and three-dimensional MRCP images were rendered by post processing. Multiplanar multisequence MR imaging of the abdomen was performed without intravenous contrast. Heavily T2-weighted images of the biliary and pancreatic ducts were obtained, and three-dimensional MRCP images were rendered by post processing. CONTRAST:  64mL GADAVIST GADOBUTROL 1 MMOL/ML IV SOLN COMPARISON:  Ultrasound 11/07/2020 FINDINGS: Lower chest: The lung bases are grossly clear. Small pleural effusions but no pulmonary lesions or pericardial effusion. Hepatobiliary: Left hepatic lobe cyst is noted. No worrisome hepatic lesions or intrahepatic biliary dilatation. Gallbladder demonstrates layering gallstones. There is also significant gallbladder wall thickening and pericholecystic fluid consistent with acute cholecystitis. Normal caliber and course of the common bile duct. No common bile duct stones. Pancreas:  No mass, inflammation or ductal dilatation. Spleen:  Normal size.  No focal lesions. Adrenals/Urinary Tract: Adrenal glands and kidneys are unremarkable. Simple right renal cyst. Stomach/Bowel: The stomach, duodenum, small bowel and colon are unremarkable. Vascular/Lymphatic: The aorta and branch vessels are patent. Major venous structures are patent. No mesenteric or retroperitoneal mass or adenopathy. Other:  No ascites or abdominal wall hernia. Musculoskeletal: No significant bony findings. IMPRESSION: 1. MR findings consistent with acute calculus  cholecystitis. 2. Normal caliber and course of the common bile duct. No common bile duct stones. 3. Left hepatic lobe cyst. Electronically Signed   By: Marijo Sanes M.D.   On: 11/07/2020 19:01   US ABDOMEN LIMITED RUQ (LIVER/GB)  Result Date: 11/07/2020 CLINICAL DATA:  Acute right upper quadrant abdominal pain. EXAM: ULTRASOUND ABDOMEN LIMITED RIGHT UPPER QUADRANT COMPARISON:  None. FINDINGS: Gallbladder: Minimal cholelithiasis. No gallbladder wall thickening or pericholecystic fluid is noted. No sonographic Murphy's sign is noted. Common bile duct: Diameter: 3 mm which is within normal limits. Liver: No focal lesion identified. Within normal limits in parenchymal echogenicity. Portal vein is patent on color Doppler imaging with normal direction of blood flow towards the liver. Other: None. IMPRESSION: Minimal cholelithiasis without cholecystitis. No other abnormality seen in the right upper quadrant of the abdomen. Electronically Signed   By: Marijo Conception M.D.   On: 11/07/2020 14:43       Assessment & Plan:   Problem List Items Addressed This Visit     Coronary artery disease of native artery of native heart with stable angina pectoris ([place])    Continues on metoprolol and lipitor.  No chest pain.  Breathing stable.  Continue risk factor modification.        DCIS (ductal carcinoma in situ)    S/p excision and XRT.  Followed by Dr Bary Castilla.  Last evaluated 08/2020.  Stable.  Recommended f/u in 11 months.        Essential hypertension    Continue metoprolol.  Blood pressure as outlined.  No changes in medication.  Follow pressures.  Follow metabolic panel.        Hematuria    Previously evaluated by urology.  Persistent red blood cells in urine.  Discussed f/u with urology to confirm if any further w/up warranted.  She is agreeable for referral.  Relevant Orders   Ambulatory referral to Urology   Hyperlipidemia    Low cholesterol diet and exercise.  Continue lipitor.  Follow  lipid panel and liver function tests.         Obstructive hypertrophic cardiomyopathy ([place])    Has seen cardiology. On metoprolol.  Stable.  Follow.        Stress    Continues on zoloft.  Overall appears to be handling things well.  Does not feel needs any further intervention.  Follow.        Thrombocytopenia ([facility])    Platelet count decreased on recent check.  Plan for f/u platelet count to confirm stable/normal.         Relevant Orders   CBC with Differential/Platelet   Other Visit Diagnoses     Need for pneumococcal vaccine       Relevant Orders   Pneumococcal conjugate vaccine 20-valent (Prevnar 20) (Completed)        Einar Pheasant, MD

## 2021-01-21 ENCOUNTER — Telehealth: Payer: Self-pay | Admitting: Internal Medicine

## 2021-01-21 ENCOUNTER — Encounter: Payer: Self-pay | Admitting: Internal Medicine

## 2021-01-21 DIAGNOSIS — D696 Thrombocytopenia, unspecified: Secondary | ICD-10-CM | POA: Insufficient documentation

## 2021-01-21 NOTE — Assessment & Plan Note (Signed)
Continues on zoloft.  Overall appears to be handling things well.  Does not feel needs any further intervention.  Follow.  

## 2021-01-21 NOTE — Assessment & Plan Note (Signed)
Has seen cardiology. On metoprolol.  Stable.  Follow.

## 2021-01-21 NOTE — Assessment & Plan Note (Signed)
Previously evaluated by urology.  Persistent red blood cells in urine.  Discussed f/u with urology to confirm if any further w/up warranted.  She is agreeable for referral.

## 2021-01-21 NOTE — Assessment & Plan Note (Signed)
Low cholesterol diet and exercise.  Continue lipitor. Follow lipid panel and liver function tests.   

## 2021-01-21 NOTE — Assessment & Plan Note (Signed)
Continue metoprolol.  Blood pressure as outlined.  No changes in medication.  Follow pressures.  Follow metabolic panel.  

## 2021-01-21 NOTE — Telephone Encounter (Signed)
Please notify Mary Walter that I reviewed her information more from her recent hospitalization. Platelet count was decreased some during that time.  See if she would be agreeable to come in for non fasting lab to have cbc rechecked - to confirm platelet count stable/normal.  Please schedule non fasting lab appt within 1-2 weeks.  I have placed order for f/u lab.

## 2021-01-21 NOTE — Assessment & Plan Note (Signed)
Platelet count decreased on recent check.  Plan for f/u platelet count to confirm stable/normal.

## 2021-01-21 NOTE — Assessment & Plan Note (Signed)
S/p excision and XRT.  Followed by Dr Bary Castilla.  Last evaluated 08/2020.  Stable.  Recommended f/u in 11 months.

## 2021-01-21 NOTE — Assessment & Plan Note (Signed)
Continues on metoprolol and lipitor.  No chest pain.  Breathing stable.  Continue risk factor modification.

## 2021-01-22 NOTE — Telephone Encounter (Signed)
LMTCB

## 2021-01-23 ENCOUNTER — Telehealth: Payer: Self-pay | Admitting: Internal Medicine

## 2021-01-23 NOTE — Telephone Encounter (Signed)
Left message for patient to call back and schedule Medicare Annual Wellness Visit (AWV) in office.   If not able to come in office, please offer to do virtually or by telephone.   Due for AWVI  Please schedule at anytime with Nurse Health Advisor.   

## 2021-01-25 NOTE — Telephone Encounter (Signed)
Lab appt has been scheduled.  

## 2021-02-05 NOTE — Progress Notes (Signed)
02/06/21 10:32 AM   Mary Walter [birth year] [id number]:[id number]  Referring provider:  Einar Pheasant, [id number] [place],  [place] [address] Chief Complaint  Patient presents with   Hematuria      HPI: Mary Walter is a 65 y.o.female who presents today for further evaluation of microscopic hematuria.  She has a long standing history for the last 7 year of microscopic hematuria , <10 RBC, 3-10 WBC in her urine.   Her 11/07/2020 MR abdomen without and with contrast (including MRCP) showed a simple right renal cyst but was otherwise unremarkable.   On 5/18//2022 she underwent a XI robotic assisted laparoscopic cholecystectomy with Dr. Caroleen Hamman.   Her most recent routine microscopic urinalysis on 01/09/2021 showed trace hematuria and leukocytes, 7-10 WBC, 3-6 RBC, presence of mucus, few squamous epithelial cells, rare renal epithel and bacteria.   Urinalysis today, hematuria,  6-10 WBC and 7-30 RBC.   Never smoker.  No   She is doing well today and states despite having hematuria under microscope she has never experienced seeing hematuria.   S/p evaluation in mid 90s and early 2000s for hematuria including what sounds like IVP and cysto which were negative.    PMH: Past Medical History:  Diagnosis Date   Breast cancer ([place])    Chest pain    a. 10/2015 MV: abnl ECG w/ inf/antlat ST depression w/ exercise (in setting of LVH @ baseline). No ischemia/infarct.   Dyspnea    WITH EXERTION DUE TO ENLARGED HEART   Family history of adverse reaction to anesthesia    SON-LUNGS FILLED UP WITH FLUID AFTER APPENDECTOMY   Hematuria    Hypercholesterolemia    Hypertension    Hypertrophic cardiomegaly    a. 10/2019 Echo: EF 70-75%, mod LVH. No rwma. Mild systolic anterior motion of the mitral valve with mild LVOT obstruction (peak gradient 20 mmHg at rest and 26 mmHg with Valsalva).  Normal RV systolic function.  Normal PASP.  Mildly dilated LA.    Personal history of radiation therapy    Systolic murmur     Surgical History: Past Surgical History:  Procedure Laterality Date   BREAST BIOPSY Left 10/27/2019   Affirm bx-distortion-     clip-path ADH   BREAST BIOPSY Left 01/07/2020   Affim bx 1 area/ x clip and ribbon clip/ ADH   BREAST LUMPECTOMY     BREAST LUMPECTOMY WITH NEEDLE LOCALIZATION Left 02/14/2020   Procedure: BREAST LUMPECTOMY WITH NEEDLE LOCALIZATION;  Surgeon: Robert Bellow, MD;  Location: [facility] ORS;  Service: General;  Laterality: Left;   FACIAL FRACTURE SURGERY     X 2    Home Medications:  Allergies as of 02/06/2021       Reactions   Macrobid [nitrofurantoin Macrocrystal]    unknown   Penicillins    Childhood allergy        Medication List        Accurate as of February 06, 2021 10:32 AM. If you have any questions, ask your nurse or doctor.          aspirin 81 MG EC tablet Take 1 tablet (81 mg total) by mouth daily.   atorvastatin 40 MG tablet Commonly known as: LIPITOR Take 1 tablet (40 mg total) by mouth daily.   Calcium Carbonate-Vitamin D3 600-400 MG-UNIT Tabs Take by mouth.   Magnesium 250 MG Tabs Take by mouth.   metoprolol succinate 25 MG 24 hr tablet Commonly known as: Toprol XL Take  1 tablet (25 mg total) by mouth daily.   multivitamin tablet Take 1 tablet by mouth daily.   sertraline 50 MG tablet Commonly known as: ZOLOFT Take 1 tablet by mouth once daily   tamoxifen 20 MG tablet Commonly known as: NOLVADEX Take 20 mg by mouth daily.   Vitamin D (Cholecalciferol) 10 MCG (400 UNIT) Caps Take by mouth.        Allergies:  Allergies  Allergen Reactions   Macrobid [Nitrofurantoin Macrocrystal]     unknown   Penicillins     Childhood allergy    Family History: Family History  Problem Relation Age of Onset   Diabetes Mother    Heart disease Mother    Hypertension Mother    Cancer Father        prostate    Diverticulitis Father    CVA Father    Heart  disease Father    Stroke Father    Hypertension Father    Heart murmur Father    Heart attack Brother    CVA Paternal Grandmother    Breast cancer Cousin     Social History:  reports that she has never smoked. She has never used smokeless tobacco. She reports that she does not drink alcohol and does not use drugs.   Physical Exam: Vitals:   02/06/21 1008  BP: (!) 150/79  Pulse: 70   Constitutional:  Alert and oriented, No acute distress. HEENT: [place] AT, moist mucus membranes.  Trachea midline, no masses. Cardiovascular: No clubbing, cyanosis, or edema. Respiratory: Normal respiratory effort, no increased work of breathing. Skin: No rashes, bruises or suspicious lesions. Neurologic: Grossly intact, no focal deficits, moving all 4 extremities. Psychiatric: Normal mood and affect.  Laboratory Data:  Lab Results  Component Value Date   CREATININE 0.84 01/09/2021    Lab Results  Component Value Date   HGBA1C 5.2 03/30/2013    Urinalysis  Urinalysis today, hematuria, 6-10 WBC and 11-30 RBC, no bacteria, nitrite negative, otherwise unremarkable.   Assessment & Plan:    Microscopic hematuria -Longstanding without risk factors other than age - We discussed the differential diagnosis for microscopic hematuria including nephrolithiasis, renal or upper tract tumors, bladder stones, UTIs, or bladder tumors as well as undetermined etiologies. We reviewed the AUA guidelines -Given that she had recent upper tract imaging, we will hold off on repeat upper tract imaging.  We discussed that she had incomplete evaluation of her ureters otherwise there is no concern for additional upper tract pathology in light of recent MRI of the kidneys -She is agreeable to proceeding with cystoscopy -We will consider additional upper tract imaging if she develops gross hematuria or worsening degree of microscopic hematuria   F/u cysto  I,Kailey Littlejohn,acting as a scribe for Hollice Espy,  MD.,have documented all relevant documentation on the behalf of Hollice Espy, MD,as directed by  Hollice Espy, MD while in the presence of Hollice Espy, MD.  I have reviewed the above documentation for accuracy and completeness, and I agree with the above.   Hollice Espy, MD   [facility] [address] [place], [place] [address] [phone]

## 2021-02-06 ENCOUNTER — Encounter: Payer: Self-pay | Admitting: Urology

## 2021-02-06 ENCOUNTER — Ambulatory Visit: Payer: Medicare HMO | Admitting: Urology

## 2021-02-06 ENCOUNTER — Other Ambulatory Visit: Payer: Self-pay

## 2021-02-06 VITALS — BP 150/79 | HR 70 | Ht 65.0 in | Wt 165.0 lb

## 2021-02-06 DIAGNOSIS — R319 Hematuria, unspecified: Secondary | ICD-10-CM | POA: Diagnosis not present

## 2021-02-06 LAB — URINALYSIS, COMPLETE
Bilirubin, UA: NEGATIVE
Glucose, UA: NEGATIVE
Ketones, UA: NEGATIVE
Leukocytes,UA: NEGATIVE
Nitrite, UA: NEGATIVE
Protein,UA: NEGATIVE
Specific Gravity, UA: 1.025 (ref 1.005–1.030)
Urobilinogen, Ur: 0.2 mg/dL (ref 0.2–1.0)
pH, UA: 5.5 (ref 5.0–7.5)

## 2021-02-06 LAB — MICROSCOPIC EXAMINATION: Bacteria, UA: NONE SEEN

## 2021-02-06 NOTE — Patient Instructions (Signed)
Cystoscopy Cystoscopy is a procedure that is used to help diagnose and sometimes treat conditions that affect the lower urinary tract. The lower urinary tract includes the bladder and the urethra. The urethra is the tube that drains urine from the bladder. Cystoscopy is done using a thin, tube-shaped instrument with a light and camera at the end (cystoscope). The cystoscope may be hard or flexible, depending on the goal of the procedure. The cystoscope is inserted through the urethra, into the bladder. Cystoscopy may be recommended if you have: Urinary tract infections that keep coming back. Blood in the urine (hematuria). An inability to control when you urinate (urinary incontinence) or an overactive bladder. Unusual cells found in a urine sample. A blockage in the urethra, such as a urinary stone. Painful urination. An abnormality in the bladder found during an intravenous pyelogram (IVP) or CT scan. Cystoscopy may also be done to remove a sample of tissue to be examined under a microscope (biopsy). What are the risks? Generally, this is a safe procedure. However, problems may occur, including: Infection. Bleeding.  What happens during the procedure?  You will be given one or more of the following: A medicine to numb the area (local anesthetic). The area around the opening of your urethra will be cleaned. The cystoscope will be passed through your urethra into your bladder. Germ-free (sterile) fluid will flow through the cystoscope to fill your bladder. The fluid will stretch your bladder so that your health care provider can clearly examine your bladder walls. Your doctor will look at the urethra and bladder. The cystoscope will be removed The procedure may vary among health care providers  What can I expect after the procedure? After the procedure, it is common to have: Some soreness or pain in your abdomen and urethra. Urinary symptoms. These include: Mild pain or burning when you  urinate. Pain should stop within a few minutes after you urinate. This may last for up to 1 week. A small amount of blood in your urine for several days. Feeling like you need to urinate but producing only a small amount of urine. Follow these instructions at home: General instructions Return to your normal activities as told by your health care provider.  Do not drive for 24 hours if you were given a sedative during your procedure. Watch for any blood in your urine. If the amount of blood in your urine increases, call your health care provider. If a tissue sample was removed for testing (biopsy) during your procedure, it is up to you to get your test results. Ask your health care provider, or the department that is doing the test, when your results will be ready. Drink enough fluid to keep your urine pale yellow. Keep all follow-up visits as told by your health care provider. This is important. Contact a health care provider if you: Have pain that gets worse or does not get better with medicine, especially pain when you urinate. Have trouble urinating. Have more blood in your urine. Get help right away if you: Have blood clots in your urine. Have abdominal pain. Have a fever or chills. Are unable to urinate. Summary Cystoscopy is a procedure that is used to help diagnose and sometimes treat conditions that affect the lower urinary tract. Cystoscopy is done using a thin, tube-shaped instrument with a light and camera at the end. After the procedure, it is common to have some soreness or pain in your abdomen and urethra. Watch for any blood in your urine.   If the amount of blood in your urine increases, call your health care provider. If you were prescribed an antibiotic medicine, take it as told by your health care provider. Do not stop taking the antibiotic even if you start to feel better. This information is not intended to replace advice given to you by your health care provider. Make  sure you discuss any questions you have with your health care provider. Document Revised: 06/02/2018 Document Reviewed: 06/02/2018 Elsevier Patient Education  2020 [employer] Inc.  

## 2021-02-09 ENCOUNTER — Other Ambulatory Visit: Payer: Self-pay

## 2021-02-09 ENCOUNTER — Other Ambulatory Visit (INDEPENDENT_AMBULATORY_CARE_PROVIDER_SITE_OTHER): Payer: Medicare HMO

## 2021-02-09 DIAGNOSIS — D696 Thrombocytopenia, unspecified: Secondary | ICD-10-CM

## 2021-02-09 LAB — CBC WITH DIFFERENTIAL/PLATELET
Basophils Absolute: 0.1 10*3/uL (ref 0.0–0.1)
Basophils Relative: 1.4 % (ref 0.0–3.0)
Eosinophils Absolute: 0.1 10*3/uL (ref 0.0–0.7)
Eosinophils Relative: 1.9 % (ref 0.0–5.0)
HCT: 41.7 % (ref 36.0–46.0)
Hemoglobin: 14 g/dL (ref 12.0–15.0)
Lymphocytes Relative: 32.9 % (ref 12.0–46.0)
Lymphs Abs: 2.1 10*3/uL (ref 0.7–4.0)
MCHC: 33.7 g/dL (ref 30.0–36.0)
MCV: 90.6 fl (ref 78.0–100.0)
Monocytes Absolute: 0.4 10*3/uL (ref 0.1–1.0)
Monocytes Relative: 6.8 % (ref 3.0–12.0)
Neutro Abs: 3.7 10*3/uL (ref 1.4–7.7)
Neutrophils Relative %: 57 % (ref 43.0–77.0)
Platelets: 136 10*3/uL — ABNORMAL LOW (ref 150.0–400.0)
RBC: 4.6 Mil/uL (ref 3.87–5.11)
RDW: 13.8 % (ref 11.5–15.5)
WBC: 6.5 10*3/uL (ref 4.0–10.5)

## 2021-02-10 ENCOUNTER — Other Ambulatory Visit: Payer: Self-pay | Admitting: Internal Medicine

## 2021-02-10 DIAGNOSIS — E785 Hyperlipidemia, unspecified: Secondary | ICD-10-CM

## 2021-02-10 DIAGNOSIS — I10 Essential (primary) hypertension: Secondary | ICD-10-CM

## 2021-02-10 DIAGNOSIS — D696 Thrombocytopenia, unspecified: Secondary | ICD-10-CM

## 2021-02-10 NOTE — Progress Notes (Signed)
Orders placed for f/u labs.  

## 2021-02-27 NOTE — Progress Notes (Signed)
   02/28/21  CC:  Chief Complaint  Patient presents with   Cysto     HPI: Mary Walter is a 65 y.o.female with a personal history of microscopic hematuria, who presents today for cystoscopy.  MRI abdomen with/without contrast (including MRCP) on 11/07/2020 revealed a simple right renal cyst but was otherwise unremarkable.   She underwent a XI robotic assisted laparoscopic cholecystectomy with Dr. Caroleen Hamman on 11/08/2020.   She has a long standing history for the last 7 years of microscopic hematuria, <10 RBC, 3-10 WBC in her urine. Her recent complete urinalysis on 02/06/2021 showed 2+ RBC but was otherwise unremarkable. Microscopic examination showed 6-10 WBC, 1-30 RBC, and no bacteria.   She is doing well today.    Vitals:   02/28/21 0845  BP: (!) 147/70  Pulse: 65   NED. A&Ox3.   No respiratory distress   Abd soft, NT, ND Normal external genitalia with patent urethral meatus  Cystoscopy Procedure Note  Patient identification was confirmed, informed consent was obtained, and patient was prepped using Betadine solution.  Lidocaine jelly was administered per urethral meatus.    Procedure: - Flexible cystoscope introduced, without any difficulty.   - Thorough search of the bladder revealed:    normal urethral meatus    normal urothelium    no stones    no ulcers     no tumors    no urethral polyps    no trabeculation  - Ureteral orifices were normal in position and appearance.  Post-Procedure: - Patient tolerated the procedure well  Assessment/ Plan:  Microscopic hematuria  - Longstanding without risk other than age  - Cystoscopy today was normal  -We discussed repeating CT urogram if the degree of her hematuria worsens or she develops gross hematuria, additional upper tract imaging has been negative thus far which is reassuring -Plan for reevaluation in 2 years if she has persistent microscopic hematuria including cystoscopy  F/u 2 years with UA or  sooner as needed   I,Kailey Littlejohn,acting as a scribe for Hollice Espy, MD.,have documented all relevant documentation on the behalf of Hollice Espy, MD,as directed by  Hollice Espy, MD while in the presence of Hollice Espy, MD.  I have reviewed the above documentation for accuracy and completeness, and I agree with the above.   Hollice Espy, MD

## 2021-02-28 ENCOUNTER — Other Ambulatory Visit: Payer: Self-pay

## 2021-02-28 ENCOUNTER — Encounter: Payer: Self-pay | Admitting: Urology

## 2021-02-28 ENCOUNTER — Ambulatory Visit: Payer: Medicare HMO | Admitting: Urology

## 2021-02-28 VITALS — BP 147/70 | HR 65 | Ht 65.0 in | Wt 165.0 lb

## 2021-02-28 DIAGNOSIS — R319 Hematuria, unspecified: Secondary | ICD-10-CM

## 2021-03-01 LAB — URINALYSIS, COMPLETE
Bilirubin, UA: NEGATIVE
Glucose, UA: NEGATIVE
Ketones, UA: NEGATIVE
Leukocytes,UA: NEGATIVE
Nitrite, UA: NEGATIVE
Protein,UA: NEGATIVE
Specific Gravity, UA: 1.02 (ref 1.005–1.030)
Urobilinogen, Ur: 0.2 mg/dL (ref 0.2–1.0)
pH, UA: 7 (ref 5.0–7.5)

## 2021-03-01 LAB — MICROSCOPIC EXAMINATION

## 2021-03-22 ENCOUNTER — Other Ambulatory Visit: Payer: Self-pay | Admitting: Internal Medicine

## 2021-04-04 ENCOUNTER — Other Ambulatory Visit: Payer: Self-pay

## 2021-04-04 ENCOUNTER — Encounter: Payer: Self-pay | Admitting: Cardiovascular Disease

## 2021-04-04 ENCOUNTER — Ambulatory Visit: Payer: Medicare HMO | Admitting: Cardiovascular Disease

## 2021-04-04 VITALS — BP 130/70 | HR 64 | Ht 65.0 in | Wt 166.0 lb

## 2021-04-04 DIAGNOSIS — I10 Essential (primary) hypertension: Secondary | ICD-10-CM

## 2021-04-04 DIAGNOSIS — I2510 Atherosclerotic heart disease of native coronary artery without angina pectoris: Secondary | ICD-10-CM | POA: Diagnosis not present

## 2021-04-04 DIAGNOSIS — I421 Obstructive hypertrophic cardiomyopathy: Secondary | ICD-10-CM | POA: Diagnosis not present

## 2021-04-04 DIAGNOSIS — E782 Mixed hyperlipidemia: Secondary | ICD-10-CM

## 2021-04-04 MED ORDER — METOPROLOL SUCCINATE ER 25 MG PO TB24: 25.0000 mg | ORAL_TABLET | Freq: Every day | ORAL | 3 refills | Status: DC

## 2021-04-04 MED ORDER — ATORVASTATIN CALCIUM 40 MG PO TABS: 40.0000 mg | ORAL_TABLET | Freq: Every day | ORAL | 3 refills | Status: DC

## 2021-04-04 NOTE — Patient Instructions (Addendum)
Medication Instructions:  Consider extra 1/2 doses of metoprolol  as needed for chest tightness  Hydration (keep well hydrated)  If you need a refill on your cardiac medications before your next appointment, please call your pharmacy.   Lab work: No new labs needed  Testing/Procedures: No new testing needed  Follow-Up: At [facility], you and your health needs are our priority.  As part of our continuing mission to provide you with exceptional heart care, we have created designated Provider Care Teams.  These Care Teams include your primary Cardiologist (physician) and Advanced Practice Providers (APPs -  Physician Assistants and Nurse Practitioners) who all work together to provide you with the care you need, when you need it.  You will need a follow up appointment in 12 months  Providers on your designated Care Team:   Murray Hodgkins, NP Christell Faith, PA-C Marrianne Mood, PA-C Cadence [place], [place]  COVID-19 Vaccine Information can be found at: [email or web address].uk For questions related to vaccine distribution or appointments, please email [email or web address] or call [phone].

## 2021-04-04 NOTE — Progress Notes (Signed)
Cardiology Office Note  Date:  04/04/2021   ID:  Valeria, Boza 11/28/1955, MRN [id number]  PCP:  Einar Pheasant, MD   Chief Complaint  Patient presents with   Chest Pain    Patient c/o a spell last Friday of chest pain/pressure, vomiting & left arm pain. Medications reviewed by the patient verbally.      HPI:  ASAL TEAS is a 65 y.o. female with  hypertension,  Moderate LVH, mild outflow tract obstruction In the hospital 11/19/19 with  2 episodes of chest tightness with vomiting Diagnosed with breast cancer, underwent surgery, completing XRT 21 cycles Who presents for f/u of her chest pain, LVOT obstruction  She reports having recent episode of chest pain on exertion culminating in vomiting Reports that she was at work by herself moving trash cans around developed some chest tightness left arm pain Symptoms persisted, got worse, eventually vomited Lay down and eventually symptoms resolved Denies being dehydrated  Similar episode x2 in 2021  Currently feels back to her baseline, tries not to overdo it and she sits or lays down if she has any chest tightness  EKG personally reviewed by myself on todays visit Normal sinus rhythm LVH, diffuse ST-T wave abnormality consistent with strain  Prior records reviewed Echo with hyperdynamic LVEF 70 to 75%, moderate LVH, mild LVOT obstruction with a peak gradient of 20 mmHg at rest and 26 mmHg with Valsalva  coronary CTA 12/16/2019 with coronary calcium score of 182 placing her in the 88th percentile for age/sex, mild (25-49%) calcified plaque in proximal LAD, recommended for risk factor modification (LV wall thickness was not detailed)  PMH:   has a past medical history of Breast cancer ([place]), Chest pain, Dyspnea, Family history of adverse reaction to anesthesia, Hematuria, Hypercholesterolemia, Hypertension, Hypertrophic cardiomegaly, Personal history of radiation therapy, and Systolic murmur.  PSH:    Past Surgical  History:  Procedure Laterality Date   BREAST BIOPSY Left 10/27/2019   Affirm bx-distortion-     clip-path [facility]   BREAST BIOPSY Left 01/07/2020   Affim bx 1 area/ x clip and ribbon clip/ ADH   BREAST LUMPECTOMY     BREAST LUMPECTOMY WITH NEEDLE LOCALIZATION Left 02/14/2020   Procedure: BREAST LUMPECTOMY WITH NEEDLE LOCALIZATION;  Surgeon: Robert Bellow, MD;  Location: [facility] ORS;  Service: General;  Laterality: Left;   CHOLECYSTECTOMY  10/2020   FACIAL FRACTURE SURGERY     X 2    Current Outpatient Medications  Medication Sig Dispense Refill   aspirin EC 81 MG EC tablet Take 1 tablet (81 mg total) by mouth daily. 30 tablet 0   Calcium Carbonate-Vitamin D3 600-400 MG-UNIT TABS Take by mouth.     Magnesium 250 MG TABS Take by mouth.     Multiple Vitamin (MULTIVITAMIN) tablet Take 1 tablet by mouth daily.     sertraline (ZOLOFT) 50 MG tablet Take 1 tablet by mouth once daily 90 tablet 0   tamoxifen (NOLVADEX) 20 MG tablet Take 20 mg by mouth daily.     Vitamin D, Cholecalciferol, 10 MCG (400 UNIT) CAPS Take by mouth.     atorvastatin (LIPITOR) 40 MG tablet Take 1 tablet (40 mg total) by mouth daily. 90 tablet 3   metoprolol succinate (TOPROL XL) 25 MG 24 hr tablet Take 1 tablet (25 mg total) by mouth daily. May take additional half pill for chest tightness 100 tablet 3   No current facility-administered medications for this visit.    Allergies:  Macrobid [nitrofurantoin macrocrystal] and Penicillins   Social History:  The patient  reports that she has never smoked. She has never used smokeless tobacco. She reports that she does not drink alcohol and does not use drugs.   Family History:   family history includes Breast cancer in her cousin; CVA in her father and paternal grandmother; Cancer in her father; Diabetes in her mother; Diverticulitis in her father; Heart attack in her brother; Heart disease in her father and mother; Heart murmur in her father; Hypertension in her father and  mother; Stroke in her father.    Review of Systems: Review of Systems  Constitutional: Negative.   HENT: Negative.    Respiratory: Negative.    Cardiovascular:  Positive for chest pain.  Gastrointestinal: Negative.   Musculoskeletal: Negative.   Neurological: Negative.   Psychiatric/Behavioral: Negative.    All other systems reviewed and are negative.  PHYSICAL EXAM: VS:  BP 130/70 (BP Location: Left Arm, Patient Position: Sitting, Cuff Size: Normal)   Pulse 64   Ht 5\' 5"  (1.651 m)   Wt 166 lb (75.3 kg)   LMP 06/24/2010   SpO2 98%   BMI 27.62 kg/m  , BMI Body mass index is 27.62 kg/m. GEN: Well nourished, well developed, in no acute distress HEENT: normal Neck: no JVD, carotid bruits, or masses Cardiac: RRR; no murmurs, rubs, or gallops,no edema  Respiratory:  clear to auscultation bilaterally, normal work of breathing GI: soft, nontender, nondistended, + BS MS: no deformity or atrophy Skin: warm and dry, no rash Neuro:  Strength and sensation are intact Psych: euthymic mood, full affect  Recent Labs: 09/29/2020: TSH 1.60 01/09/2021: ALT 35; BUN 15; Creatinine, Ser 0.84; Potassium 3.9; Sodium 143 02/09/2021: Hemoglobin 14.0; Platelets 136.0    Lipid Panel Lab Results  Component Value Date   CHOL 104 01/09/2021   HDL 40.10 01/09/2021   LDLCALC 46 01/09/2021   TRIG 88.0 01/09/2021      Wt Readings from Last 3 Encounters:  04/04/21 166 lb (75.3 kg)  02/28/21 165 lb (74.8 kg)  02/06/21 165 lb (74.8 kg)    ASSESSMENT AND PLAN:  Problem List Items Addressed This Visit       Cardiology Problems   Hyperlipidemia   Relevant Medications   atorvastatin (LIPITOR) 40 MG tablet   metoprolol succinate (TOPROL XL) 25 MG 24 hr tablet   Essential hypertension   Relevant Medications   atorvastatin (LIPITOR) 40 MG tablet   metoprolol succinate (TOPROL XL) 25 MG 24 hr tablet   Other Relevant Orders   EKG 12-Lead   Obstructive hypertrophic cardiomyopathy (HCC) - Primary    Relevant Medications   atorvastatin (LIPITOR) 40 MG tablet   metoprolol succinate (TOPROL XL) 25 MG 24 hr tablet   Coronary artery disease involving native coronary artery of native heart without angina pectoris   Relevant Medications   atorvastatin (LIPITOR) 40 MG tablet   metoprolol succinate (TOPROL XL) 25 MG 24 hr tablet   Chest pain Recent chest discomfort on exertion 2 episodes last year, 1 recent episode Prior cardiac CT, no obstructive disease noted, less likely ischemia Unable to exclude coronary spasm versus LVOT obstruction  recommend she stay hydrated, avoid Valsalva type maneuvers, heavy lifting If she has chest discomfort, may benefit from sitting down, even laying down -We will avoid nitros given outflow tract obstruction -Discussed that she might be able to use extra half doses of metoprolol, also discussed if symptoms continue could try calcium channel blockers such as  verapamil  LVH Reported to be moderate in nature mild outflow tract obstruction,  Discussed strategies to minimize symptoms In general does well but has occasional episodes as detailed above  Essential hypertension Blood pressure is well controlled on today's visit. No changes made to the medications.  Hyperlipidemia Cholesterol at goal  Coronary calcification Seen on CT scan, nonobstructive No further ischemic work-up   Total encounter time more than 25 minutes  Greater than 50% was spent in counseling and coordination of care with the patient    Signed, Esmond Plants, M.D., Ph.D. [place], [place]

## 2021-04-11 ENCOUNTER — Other Ambulatory Visit: Payer: Self-pay

## 2021-04-11 ENCOUNTER — Other Ambulatory Visit (INDEPENDENT_AMBULATORY_CARE_PROVIDER_SITE_OTHER): Payer: Medicare HMO

## 2021-04-11 DIAGNOSIS — I10 Essential (primary) hypertension: Secondary | ICD-10-CM

## 2021-04-11 DIAGNOSIS — D696 Thrombocytopenia, unspecified: Secondary | ICD-10-CM | POA: Diagnosis not present

## 2021-04-11 DIAGNOSIS — E785 Hyperlipidemia, unspecified: Secondary | ICD-10-CM

## 2021-04-11 LAB — BASIC METABOLIC PANEL
BUN: 13 mg/dL (ref 6–23)
CO2: 27 mEq/L (ref 19–32)
Calcium: 9.8 mg/dL (ref 8.4–10.5)
Chloride: 110 mEq/L (ref 96–112)
Creatinine, Ser: 0.88 mg/dL (ref 0.40–1.20)
GFR: 68.5 mL/min (ref >60.00)
Glucose, Bld: 79 mg/dL (ref 70–99)
Potassium: 3.9 mEq/L (ref 3.5–5.1)
Sodium: 142 mEq/L (ref 135–145)

## 2021-04-11 LAB — LIPID PANEL
Cholesterol: 109 mg/dL (ref 0–200)
HDL: 39.5 mg/dL (ref >39.00)
LDL Cholesterol: 49 mg/dL (ref 0–99)
NonHDL: 69.76
Total CHOL/HDL Ratio: 3
Triglycerides: 106 mg/dL (ref 0.0–149.0)
VLDL: 21.2 mg/dL (ref 0.0–40.0)

## 2021-04-11 LAB — CBC WITH DIFFERENTIAL/PLATELET
Basophils Absolute: 0.1 10*3/uL (ref 0.0–0.1)
Basophils Relative: 1 % (ref 0.0–3.0)
Eosinophils Absolute: 0.1 10*3/uL (ref 0.0–0.7)
Eosinophils Relative: 2.2 % (ref 0.0–5.0)
HCT: 40.3 % (ref 36.0–46.0)
Hemoglobin: 13.7 g/dL (ref 12.0–15.0)
Lymphocytes Relative: 36.3 % (ref 12.0–46.0)
Lymphs Abs: 2.2 10*3/uL (ref 0.7–4.0)
MCHC: 34 g/dL (ref 30.0–36.0)
MCV: 90.6 fl (ref 78.0–100.0)
Monocytes Absolute: 0.4 10*3/uL (ref 0.1–1.0)
Monocytes Relative: 6.6 % (ref 3.0–12.0)
Neutro Abs: 3.3 10*3/uL (ref 1.4–7.7)
Neutrophils Relative %: 53.9 % (ref 43.0–77.0)
Platelets: 129 10*3/uL — ABNORMAL LOW (ref 150.0–400.0)
RBC: 4.44 Mil/uL (ref 3.87–5.11)
RDW: 13.6 % (ref 11.5–15.5)
WBC: 6.1 10*3/uL (ref 4.0–10.5)

## 2021-04-11 LAB — HEPATIC FUNCTION PANEL
ALT: 33 U/L (ref 0–35)
AST: 27 U/L (ref 0–37)
Albumin: 3.9 g/dL (ref 3.5–5.2)
Alkaline Phosphatase: 58 U/L (ref 39–117)
Bilirubin, Direct: 0.2 mg/dL (ref 0.0–0.3)
Total Bilirubin: 0.7 mg/dL (ref 0.2–1.2)
Total Protein: 6.2 g/dL (ref 6.0–8.3)

## 2021-05-03 ENCOUNTER — Other Ambulatory Visit: Payer: Self-pay

## 2021-05-03 ENCOUNTER — Ambulatory Visit
Admission: RE | Admit: 2021-05-03 | Discharge: 2021-05-03 | Disposition: A | Discharge status: Home / Self Care | Payer: Medicare HMO | Source: Ambulatory Visit | Attending: Radiation Oncology | Admitting: Radiation Oncology

## 2021-05-03 VITALS — BP 135/77 | HR 66 | Temp 97.5°F | Wt 164.9 lb

## 2021-05-03 DIAGNOSIS — Z79810 Long term (current) use of selective estrogen receptor modulators (SERMs): Secondary | ICD-10-CM | POA: Insufficient documentation

## 2021-05-03 DIAGNOSIS — Z170 Estrogen receptor positive status [ER+]: Secondary | ICD-10-CM | POA: Insufficient documentation

## 2021-05-03 DIAGNOSIS — Z923 Personal history of irradiation: Secondary | ICD-10-CM | POA: Insufficient documentation

## 2021-05-03 DIAGNOSIS — K8000 Calculus of gallbladder with acute cholecystitis without obstruction: Secondary | ICD-10-CM | POA: Diagnosis not present

## 2021-05-03 DIAGNOSIS — Z08 Encounter for follow-up examination after completed treatment for malignant neoplasm: Secondary | ICD-10-CM | POA: Diagnosis not present

## 2021-05-03 DIAGNOSIS — D0512 Intraductal carcinoma in situ of left breast: Secondary | ICD-10-CM

## 2021-05-03 DIAGNOSIS — Z86000 Personal history of in-situ neoplasm of breast: Secondary | ICD-10-CM | POA: Diagnosis not present

## 2021-05-03 DIAGNOSIS — D0511 Intraductal carcinoma in situ of right breast: Secondary | ICD-10-CM | POA: Diagnosis not present

## 2021-05-03 NOTE — Progress Notes (Signed)
Radiation Oncology Follow up Note  Name: Mary Walter   Date:   05/03/2021 MRN:  [id number] DOB: [birth year]    This 65 y.o. female presents to the clinic today for 1 year follow-up status post whole breast radiation to her right breast for ER positive ductal carcinoma in situ.  REFERRING PROVIDER: Einar Pheasant, MD  HPI: Patient is a 65 year old female now at 1 year having completed whole breast radiation to her right breast for ER positive ductal carcinoma in situ.  Seen today in routine follow-up she is doing well.  She specifically denies breast tenderness cough or bone pain..  Patient mammograms back in February which I have reviewed were BI-RADS 2 benign.  She is currently on tamoxifen tolerant at well without side effect.  Patient had MRI back in May of her abdomen showing acute calculus cholecystitis  COMPLICATIONS OF TREATMENT: none  FOLLOW UP COMPLIANCE: keeps appointments   PHYSICAL EXAM:  BP 135/77   Pulse 66   Temp (!) 97.5 F (36.4 C) (Tympanic)   Wt 164 lb 14.4 oz (74.8 kg)   LMP 06/24/2010   BMI 27.44 kg/m  Lungs are clear to A&P cardiac examination essentially unremarkable with regular rate and rhythm. No dominant mass or nodularity is noted in either breast in 2 positions examined. Incision is well-healed. No axillary or supraclavicular adenopathy is appreciated. Cosmetic result is excellent.  Well-developed well-nourished patient in NAD. HEENT reveals PERLA, EOMI, discs not visualized.  Oral cavity is clear. No oral mucosal lesions are identified. Neck is clear without evidence of cervical or supraclavicular adenopathy. Lungs are clear to A&P. Cardiac examination is essentially unremarkable with regular rate and rhythm without murmur rub or thrill. Abdomen is benign with no organomegaly or masses noted. Motor sensory and DTR levels are equal and symmetric in the upper and lower extremities. Cranial nerves II through XII are grossly intact. Proprioception is intact.  No peripheral adenopathy or edema is identified. No motor or sensory levels are noted. Crude visual fields are within normal range.  RADIOLOGY RESULTS: Mammograms reviewed compatible with above-stated findings.  MRI of her abdomen also reviewed  PLAN: Present time patient continues to do well with no evidence of disease.  On pleased with her overall progress.  Of asked to see her back in 1 year for follow-up.  She continues on tamoxifen without side effect.  She will have mammograms ordered in January February by Dr. Tollie Pizza.  Patient knows to call with any concerns.  I would like to take this opportunity to thank you for allowing me to participate in the care of your patient.Noreene Filbert, MD

## 2021-05-16 ENCOUNTER — Telehealth: Payer: Self-pay | Admitting: *Deleted

## 2021-05-16 NOTE — Telephone Encounter (Signed)
Please place future orders for lab appt.  

## 2021-05-21 NOTE — Telephone Encounter (Signed)
Appt canceled. Too early for labs

## 2021-05-22 ENCOUNTER — Other Ambulatory Visit: Payer: Medicare HMO

## 2021-05-24 ENCOUNTER — Other Ambulatory Visit: Payer: Self-pay

## 2021-05-24 ENCOUNTER — Ambulatory Visit (INDEPENDENT_AMBULATORY_CARE_PROVIDER_SITE_OTHER): Payer: Medicare HMO | Admitting: Internal Medicine

## 2021-05-24 DIAGNOSIS — I10 Essential (primary) hypertension: Secondary | ICD-10-CM | POA: Diagnosis not present

## 2021-05-24 DIAGNOSIS — I421 Obstructive hypertrophic cardiomyopathy: Secondary | ICD-10-CM | POA: Diagnosis not present

## 2021-05-24 DIAGNOSIS — I25118 Atherosclerotic heart disease of native coronary artery with other forms of angina pectoris: Secondary | ICD-10-CM

## 2021-05-24 DIAGNOSIS — E782 Mixed hyperlipidemia: Secondary | ICD-10-CM

## 2021-05-24 DIAGNOSIS — D696 Thrombocytopenia, unspecified: Secondary | ICD-10-CM | POA: Diagnosis not present

## 2021-05-24 DIAGNOSIS — R69 Illness, unspecified: Secondary | ICD-10-CM | POA: Diagnosis not present

## 2021-05-24 DIAGNOSIS — R319 Hematuria, unspecified: Secondary | ICD-10-CM

## 2021-05-24 DIAGNOSIS — D0512 Intraductal carcinoma in situ of left breast: Secondary | ICD-10-CM

## 2021-05-24 DIAGNOSIS — F439 Reaction to severe stress, unspecified: Secondary | ICD-10-CM

## 2021-05-24 NOTE — Assessment & Plan Note (Signed)
Platelet count decreased on recent check.  Plan for f/u platelet count to confirm stable/normal.

## 2021-05-24 NOTE — Progress Notes (Signed)
[name], female   DOB: 09/04/1955, 65 y.o.   MRN: [id number]   Subjective:    [name], female    DOB: 12/25/1955, 65 y.o.   MRN: [id number]  This visit occurred during the SARS-CoV-2 public health emergency.  Safety protocols were in place, including screening questions prior to the visit, additional usage of staff PPE, and extensive cleaning of exam room while observing appropriate contact time as indicated for disinfecting solutions.   Patient here for a scheduled follow up.  Marland Kitchen   HPI Here to follow up regarding her cholesterol, blood pressure and history of breast cancer.  Doing relatively well.  Feels from a heart standpoint - stable.  Sees Dr Rockey Situ.  Just evaluated 04/13/21.  Breathing stable.  No increased cough or congestion.  No acid reflux reported.  Bowels stable.  Saw Dr Erlene Quan for f/u hematuria.  S/p cystoscopy 01/2021 - normal.  Recommended follow urine.  Has noticed some pain - around left shoulder blade (muscle).  Can reproduce with palpation.  Discussed further evaluation - treatment.  Discussed PT.  Will notify me if desires any further intervention.    Past Medical History:  Diagnosis Date   Breast cancer ([place])    Chest pain    a. 10/2015 MV: abnl ECG w/ inf/antlat ST depression w/ exercise (in setting of LVH @ baseline). No ischemia/infarct.   Dyspnea    WITH EXERTION DUE TO ENLARGED HEART   Family history of adverse reaction to anesthesia    SON-LUNGS FILLED UP WITH FLUID AFTER APPENDECTOMY   Hematuria    Hypercholesterolemia    Hypertension    Hypertrophic cardiomegaly    a. 10/2019 Echo: EF 70-75%, mod LVH. No rwma. Mild systolic anterior motion of the mitral valve with mild LVOT obstruction (peak gradient 20 mmHg at rest and 26 mmHg with Valsalva).  Normal RV systolic function.  Normal PASP.  Mildly dilated LA.   Personal history of radiation therapy    Systolic murmur    Past Surgical History:  Procedure Laterality Date    BREAST BIOPSY Left 10/27/2019   Affirm bx-distortion-     clip-path ADH   BREAST BIOPSY Left 01/07/2020   Affim bx 1 area/ x clip and ribbon clip/ ADH   BREAST LUMPECTOMY     BREAST LUMPECTOMY WITH NEEDLE LOCALIZATION Left 02/14/2020   Procedure: BREAST LUMPECTOMY WITH NEEDLE LOCALIZATION;  Surgeon: Robert Bellow, MD;  Location: [facility] ORS;  Service: General;  Laterality: Left;   CHOLECYSTECTOMY  10/2020   FACIAL FRACTURE SURGERY     X 2   Family History  Problem Relation Age of Onset   Diabetes Mother    Heart disease Mother    Hypertension Mother    Cancer Father        prostate    Diverticulitis Father    CVA Father    Heart disease Father    Stroke Father    Hypertension Father    Heart murmur Father    Heart attack Brother    CVA Paternal Grandmother    Breast cancer Cousin    Social History   Socioeconomic History   Marital status: Married    Spouse name: Not on file   Number of children: 2   Years of education: Not on file   Highest education level: Not on file  Occupational History   Not on file  Tobacco Use   Smoking status: Never   Smokeless tobacco: Never  Vaping Use   Vaping Use: Never used  Substance and Sexual Activity   Alcohol use: No    Alcohol/week: 0.0 standard drinks   Drug use: No   Sexual activity: Not Currently  Other Topics Concern   Not on file  Social History Narrative   Not on file   Social Determinants of Health   Financial Resource Strain: Not on file  Food Insecurity: Not on file  Transportation Needs: Not on file  Physical Activity: Not on file  Stress: Not on file  Social Connections: Not on file     Review of Systems  Constitutional:  Negative for appetite change and unexpected weight change.  HENT:  Negative for congestion and sinus pressure.   Respiratory:  Negative for cough, chest tightness and shortness of breath.   Cardiovascular:  Negative for chest pain, palpitations and leg swelling.   Gastrointestinal:  Negative for abdominal pain, diarrhea, nausea and vomiting.  Genitourinary:  Negative for difficulty urinating and dysuria.  Musculoskeletal:  Negative for joint swelling and myalgias.  Skin:  Negative for color change and rash.  Neurological:  Negative for dizziness, light-headedness and headaches.  Psychiatric/Behavioral:  Negative for agitation and dysphoric mood.       Objective:     BP 128/64   Pulse 78   Temp 98.1 F (36.7 C) (Oral)   Resp 14   Ht 5\' 5"  (1.651 m)   Wt 166 lb 2 oz (75.4 kg)   LMP 06/24/2010   SpO2 97%   BMI 27.64 kg/m  Wt Readings from Last 3 Encounters:  05/24/21 166 lb 2 oz (75.4 kg)  05/03/21 164 lb 14.4 oz (74.8 kg)  04/04/21 166 lb (75.3 kg)    Physical Exam Vitals reviewed.  Constitutional:      General: She is not in acute distress.    Appearance: Normal appearance.  HENT:     Head: Normocephalic and atraumatic.     Right Ear: External ear normal.     Left Ear: External ear normal.  Eyes:     General: No scleral icterus.       Right eye: No discharge.        Left eye: No discharge.     Conjunctiva/sclera: Conjunctivae normal.  Neck:     Thyroid: No thyromegaly.  Cardiovascular:     Rate and Rhythm: Normal rate and regular rhythm.  Pulmonary:     Effort: No respiratory distress.     Breath sounds: Normal breath sounds. No wheezing.  Abdominal:     General: Bowel sounds are normal.     Palpations: Abdomen is soft.     Tenderness: There is no abdominal tenderness.  Musculoskeletal:        General: No swelling or tenderness.     Cervical back: Neck supple. No tenderness.  Lymphadenopathy:     Cervical: No cervical adenopathy.  Skin:    Findings: No erythema or rash.  Neurological:     Mental Status: She is alert.  Psychiatric:        Mood and Affect: Mood normal.        Behavior: Behavior normal.     Outpatient Encounter Medications as of 05/24/2021  Medication Sig   aspirin EC 81 MG EC tablet Take 1  tablet (81 mg total) by mouth daily.   atorvastatin (LIPITOR) 40 MG tablet Take 1 tablet (40 mg total) by mouth daily.   Calcium Carbonate-Vitamin D3 600-400 MG-UNIT TABS Take by mouth.   Magnesium 250  MG TABS Take by mouth.   metoprolol succinate (TOPROL XL) 25 MG 24 hr tablet Take 1 tablet (25 mg total) by mouth daily. May take additional half pill for chest tightness   Multiple Vitamin (MULTIVITAMIN) tablet Take 1 tablet by mouth daily.   sertraline (ZOLOFT) 50 MG tablet Take 1 tablet by mouth once daily   tamoxifen (NOLVADEX) 20 MG tablet Take 20 mg by mouth daily.   Vitamin D, Cholecalciferol, 10 MCG (400 UNIT) CAPS Take by mouth.   No facility-administered encounter medications on file as of 05/24/2021.     Lab Results  Component Value Date   WBC 6.1 04/11/2021   HGB 13.7 04/11/2021   HCT 40.3 04/11/2021   PLT 129.0 (L) 04/11/2021   GLUCOSE 79 04/11/2021   CHOL 109 04/11/2021   TRIG 106.0 04/11/2021   HDL 39.50 04/11/2021   LDLDIRECT 57 01/19/2020   LDLCALC 49 04/11/2021   ALT 33 04/11/2021   AST 27 04/11/2021   NA 142 04/11/2021   K 3.9 04/11/2021   CL 110 04/11/2021   CREATININE 0.88 04/11/2021   BUN 13 04/11/2021   CO2 27 04/11/2021   TSH 1.60 09/29/2020   INR 1.0 11/19/2019   HGBA1C 5.2 03/30/2013       Assessment & Plan:   Problem List Items Addressed This Visit     Coronary artery disease of native artery of native heart with stable angina pectoris ([place])    Continues on metoprolol and lipitor.  No chest pain.  Breathing stable.  Continue risk factor modification.       DCIS (ductal carcinoma in situ)    S/p excision and XRT.  Followed by Dr Bary Castilla.  Last evaluated 08/2020.  Stable.  Recommended f/u in 11 months.       Essential hypertension    Continue metoprolol.  Blood pressure as outlined.  No changes in medication.  Follow pressures.  Follow metabolic panel.       Hematuria    Evaluated by urology - 02/28/21 - cystoscopy - normal.  Follow urine. CT  urogram if hematuria worsens or if develops gross hematuria.  F/u two years.       Hyperlipidemia    Low cholesterol diet and exercise.  Continue lipitor.  Follow lipid panel and liver function tests.        Obstructive hypertrophic cardiomyopathy ([place])    Has seen cardiology. On metoprolol.  Stable.  Follow.       Stress    Continues on zoloft.  Overall appears to be handling things well.  Does not feel needs any further intervention.  Follow.       Thrombocytopenia ([facility])    Platelet count decreased on recent check.  Plan for f/u platelet count to confirm stable/normal.        Relevant Orders   CBC with Differential/Platelet     Einar Pheasant, MD

## 2021-05-28 ENCOUNTER — Encounter: Payer: Self-pay | Admitting: Internal Medicine

## 2021-05-28 NOTE — Assessment & Plan Note (Signed)
Continues on metoprolol and lipitor.  No chest pain.  Breathing stable.  Continue risk factor modification.

## 2021-05-28 NOTE — Assessment & Plan Note (Signed)
Has seen cardiology. On metoprolol.  Stable.  Follow.

## 2021-05-28 NOTE — Assessment & Plan Note (Signed)
Continue metoprolol.  Blood pressure as outlined.  No changes in medication.  Follow pressures.  Follow metabolic panel.  

## 2021-05-28 NOTE — Assessment & Plan Note (Signed)
S/p excision and XRT.  Followed by Dr Bary Castilla.  Last evaluated 08/2020.  Stable.  Recommended f/u in 11 months.

## 2021-05-28 NOTE — Assessment & Plan Note (Signed)
Evaluated by urology - 02/28/21 - cystoscopy - normal.  Follow urine. CT urogram if hematuria worsens or if develops gross hematuria.  F/u two years.  

## 2021-05-28 NOTE — Assessment & Plan Note (Signed)
Continues on zoloft.  Overall appears to be handling things well.  Does not feel needs any further intervention.  Follow.  

## 2021-05-28 NOTE — Assessment & Plan Note (Signed)
Low cholesterol diet and exercise.  Continue lipitor. Follow lipid panel and liver function tests.   

## 2021-06-20 ENCOUNTER — Other Ambulatory Visit: Payer: Self-pay | Admitting: Internal Medicine

## 2021-06-21 ENCOUNTER — Other Ambulatory Visit: Payer: Self-pay | Admitting: Internal Medicine

## 2021-07-02 ENCOUNTER — Other Ambulatory Visit: Payer: Self-pay | Admitting: General Surgery

## 2021-07-02 DIAGNOSIS — D0510 Intraductal carcinoma in situ of unspecified breast: Secondary | ICD-10-CM

## 2021-07-25 ENCOUNTER — Other Ambulatory Visit: Payer: Self-pay

## 2021-07-25 ENCOUNTER — Other Ambulatory Visit (INDEPENDENT_AMBULATORY_CARE_PROVIDER_SITE_OTHER): Payer: Medicare HMO

## 2021-07-25 DIAGNOSIS — D696 Thrombocytopenia, unspecified: Secondary | ICD-10-CM | POA: Diagnosis not present

## 2021-07-25 DIAGNOSIS — D0510 Intraductal carcinoma in situ of unspecified breast: Secondary | ICD-10-CM | POA: Diagnosis not present

## 2021-07-25 LAB — CBC WITH DIFFERENTIAL/PLATELET
Basophils Absolute: 0.1 10*3/uL (ref 0.0–0.1)
Basophils Relative: 2 % (ref 0.0–3.0)
Eosinophils Absolute: 0.1 10*3/uL (ref 0.0–0.7)
Eosinophils Relative: 1.9 % (ref 0.0–5.0)
HCT: 41.6 % (ref 36.0–46.0)
Hemoglobin: 14.1 g/dL (ref 12.0–15.0)
Lymphocytes Relative: 37.8 % (ref 12.0–46.0)
Lymphs Abs: 2.3 10*3/uL (ref 0.7–4.0)
MCHC: 33.9 g/dL (ref 30.0–36.0)
MCV: 90.3 fl (ref 78.0–100.0)
Monocytes Absolute: 0.4 10*3/uL (ref 0.1–1.0)
Monocytes Relative: 6.5 % (ref 3.0–12.0)
Neutro Abs: 3.2 10*3/uL (ref 1.4–7.7)
Neutrophils Relative %: 51.8 % (ref 43.0–77.0)
Platelets: 134 10*3/uL — ABNORMAL LOW (ref 150.0–400.0)
RBC: 4.61 Mil/uL (ref 3.87–5.11)
RDW: 13.6 % (ref 11.5–15.5)
WBC: 6.2 10*3/uL (ref 4.0–10.5)

## 2021-08-03 ENCOUNTER — Encounter: Payer: Self-pay | Admitting: *Deleted

## 2021-08-14 ENCOUNTER — Ambulatory Visit: Admission: RE | Admit: 2021-08-14 | Payer: Medicare HMO | Source: Ambulatory Visit

## 2021-08-14 ENCOUNTER — Ambulatory Visit
Admission: RE | Admit: 2021-08-14 | Discharge: 2021-08-14 | Disposition: A | Discharge status: Home / Self Care | Payer: Medicare HMO | Source: Ambulatory Visit | Attending: General Surgery | Admitting: General Surgery

## 2021-08-14 ENCOUNTER — Other Ambulatory Visit: Payer: Self-pay

## 2021-08-14 DIAGNOSIS — D0510 Intraductal carcinoma in situ of unspecified breast: Secondary | ICD-10-CM

## 2021-08-14 DIAGNOSIS — Z853 Personal history of malignant neoplasm of breast: Secondary | ICD-10-CM | POA: Diagnosis not present

## 2021-08-14 DIAGNOSIS — R922 Inconclusive mammogram: Secondary | ICD-10-CM | POA: Diagnosis not present

## 2021-08-20 IMAGING — MG DIGITAL DIAGNOSTIC BILAT W/ TOMO W/ CAD
6 of 9 series · 6 of 25 positions shown · non-contrast
Comparison: Previous exam(s).

CLINICAL DATA: LEFT lumpectomy with radiation treatment in Thursday January, 2020. Patient takes tamoxifen.

EXAM:
DIGITAL DIAGNOSTIC BILATERAL MAMMOGRAM WITH TOMOSYNTHESIS AND CAD
TECHNIQUE: Bilateral digital diagnostic mammography and breast tomosynthesis
was performed. The images were evaluated with computer-aided
detection.

[L MLO]
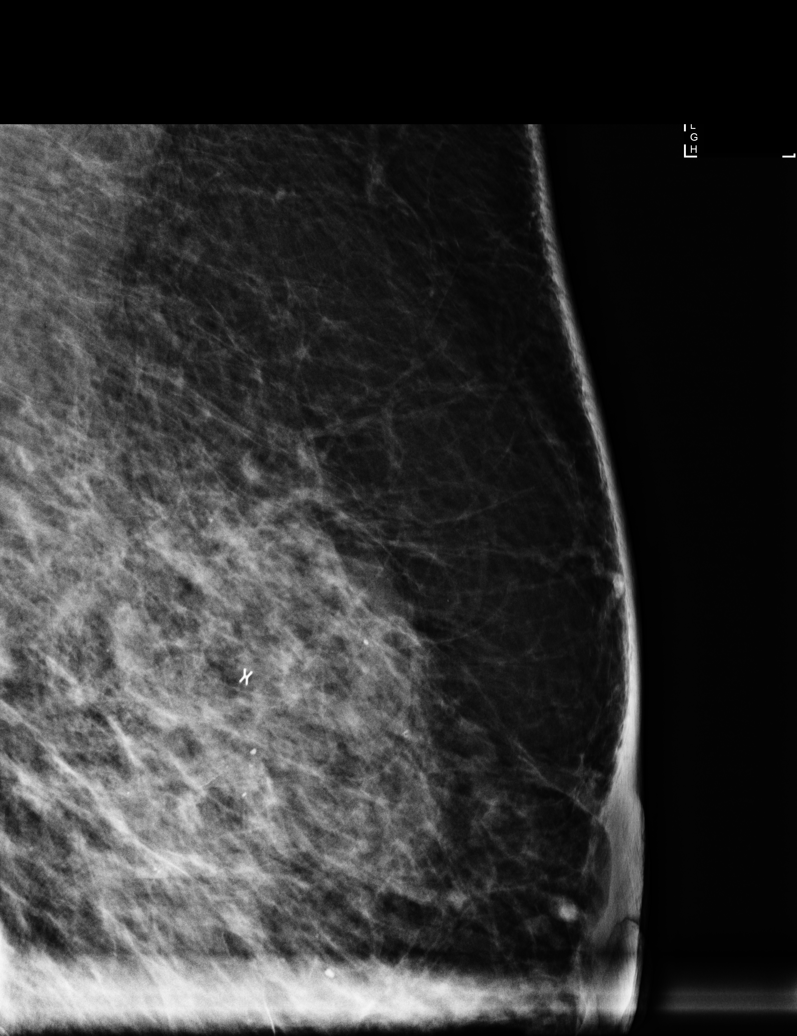

[R CC synth-2D]
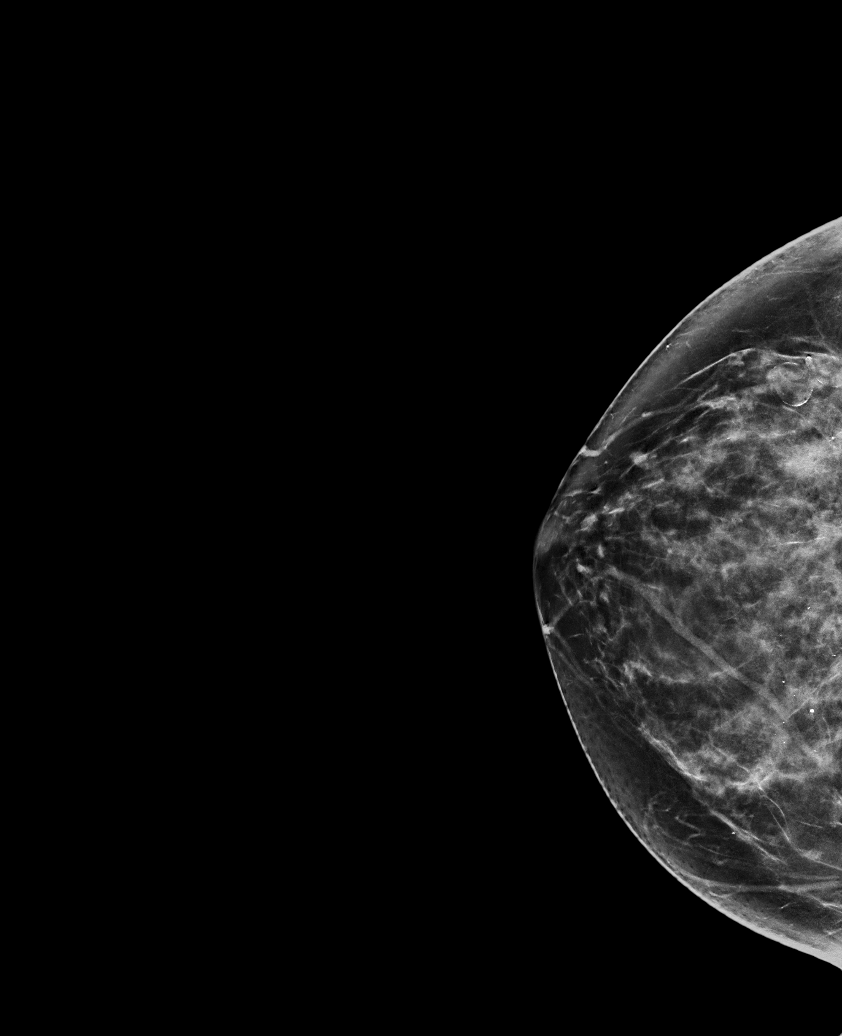

[L CC synth-2D]
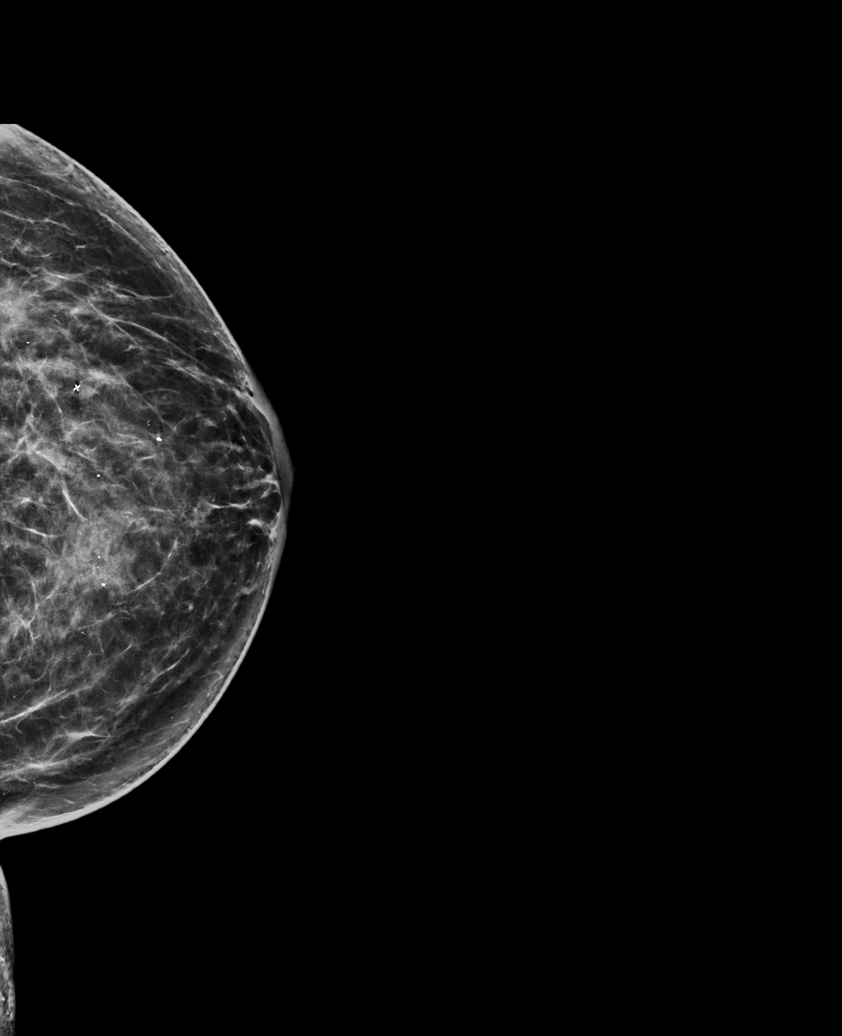

[L MLO synth-2D]
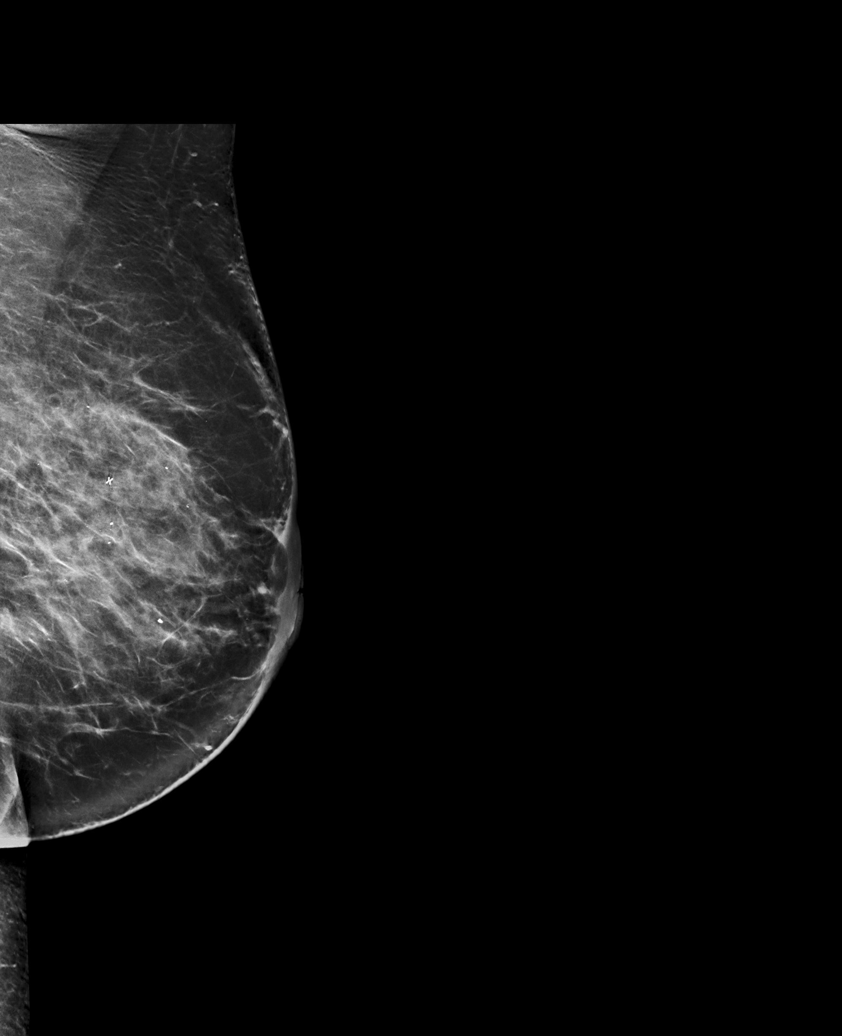

[R MLO synth-2D]
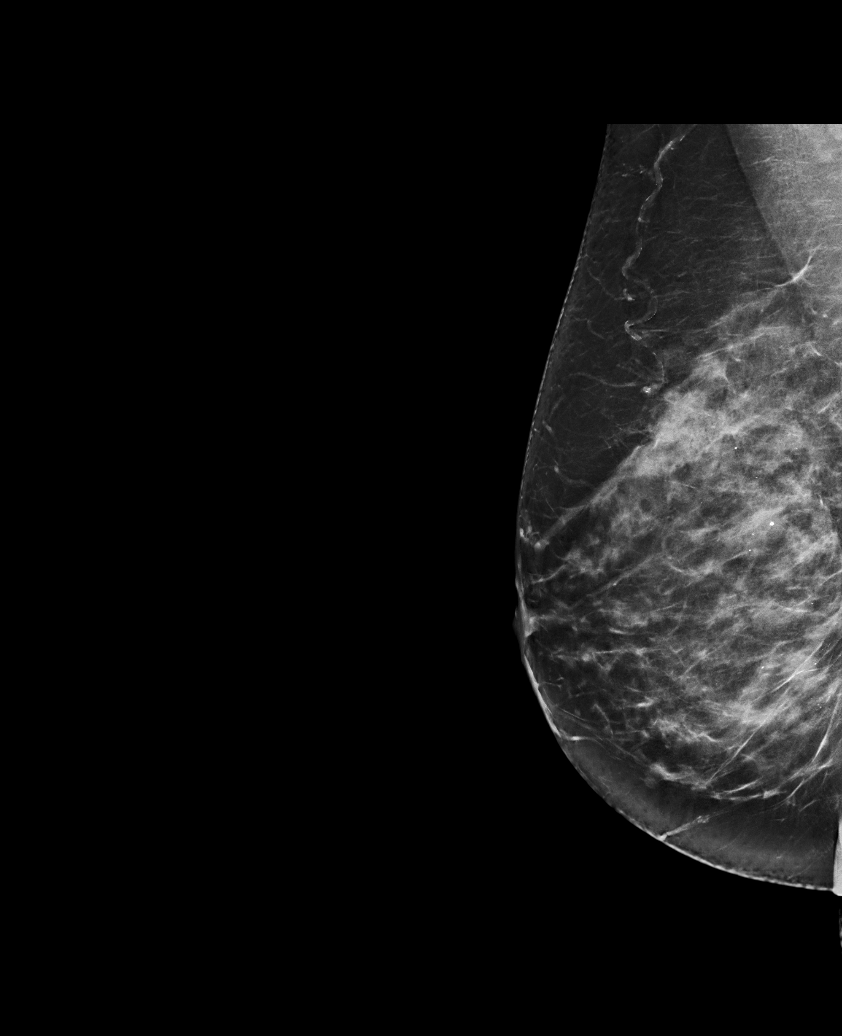

[L CC tomo · tomo slice 38/75.0]
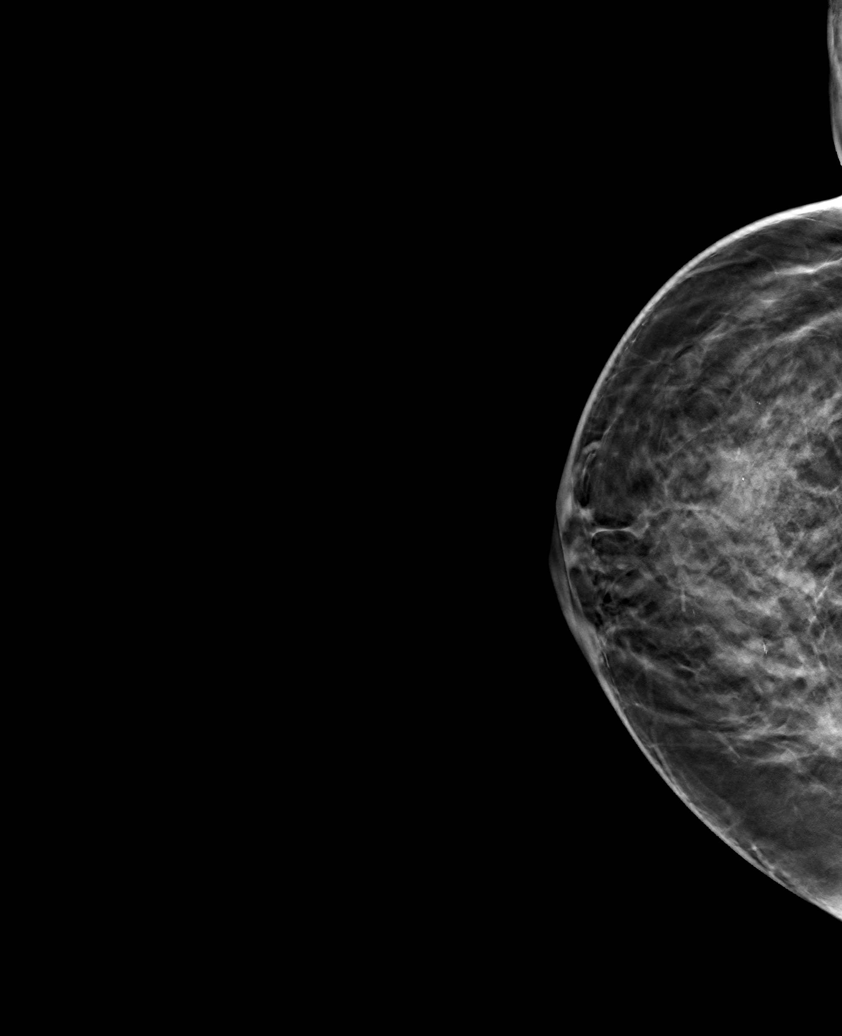

[6 of 25 positions shown; findings below may reference images not displayed]

ACR Breast Density Category c: The breast tissue is heterogeneously
dense, which may obscure small masses.
FINDINGS: Post operative changes are seen in the LEFTbreast. No suspicious
mass, distortion, or microcalcifications are identified to suggest
presence of malignancy.
IMPRESSION: No mammographic evidence for malignancy.

RECOMMENDATION:
Diagnostic mammogram is suggested in 1 year. (Code:NX-8-PP2)

I have discussed the findings and recommendations with the patient.
If applicable, a reminder letter will be sent to the patient
regarding the next appointment.

BI-RADS CATEGORY  2: Benign.

## 2021-08-28 DIAGNOSIS — D0512 Intraductal carcinoma in situ of left breast: Secondary | ICD-10-CM | POA: Diagnosis not present

## 2021-09-24 ENCOUNTER — Encounter: Payer: Self-pay | Admitting: Internal Medicine

## 2021-09-24 ENCOUNTER — Ambulatory Visit (INDEPENDENT_AMBULATORY_CARE_PROVIDER_SITE_OTHER): Payer: Medicare HMO | Admitting: Internal Medicine

## 2021-09-24 VITALS — BP 122/70 | HR 64 | Temp 97.9°F | Resp 21 | Ht 65.0 in | Wt 167.8 lb

## 2021-09-24 DIAGNOSIS — I421 Obstructive hypertrophic cardiomyopathy: Secondary | ICD-10-CM

## 2021-09-24 DIAGNOSIS — F439 Reaction to severe stress, unspecified: Secondary | ICD-10-CM | POA: Diagnosis not present

## 2021-09-24 DIAGNOSIS — Z0000 Encounter for general adult medical examination without abnormal findings: Secondary | ICD-10-CM | POA: Diagnosis not present

## 2021-09-24 DIAGNOSIS — I10 Essential (primary) hypertension: Secondary | ICD-10-CM | POA: Diagnosis not present

## 2021-09-24 DIAGNOSIS — D0512 Intraductal carcinoma in situ of left breast: Secondary | ICD-10-CM

## 2021-09-24 DIAGNOSIS — Z1159 Encounter for screening for other viral diseases: Secondary | ICD-10-CM | POA: Diagnosis not present

## 2021-09-24 DIAGNOSIS — E782 Mixed hyperlipidemia: Secondary | ICD-10-CM | POA: Diagnosis not present

## 2021-09-24 DIAGNOSIS — I25118 Atherosclerotic heart disease of native coronary artery with other forms of angina pectoris: Secondary | ICD-10-CM

## 2021-09-24 DIAGNOSIS — D696 Thrombocytopenia, unspecified: Secondary | ICD-10-CM | POA: Diagnosis not present

## 2021-09-24 DIAGNOSIS — R011 Cardiac murmur, unspecified: Secondary | ICD-10-CM

## 2021-09-24 DIAGNOSIS — R319 Hematuria, unspecified: Secondary | ICD-10-CM

## 2021-09-24 DIAGNOSIS — R69 Illness, unspecified: Secondary | ICD-10-CM | POA: Diagnosis not present

## 2021-09-24 LAB — CBC WITH DIFFERENTIAL/PLATELET
Basophils Absolute: 0.1 10*3/uL (ref 0.0–0.1)
Basophils Relative: 1 % (ref 0.0–3.0)
Eosinophils Absolute: 0.1 10*3/uL (ref 0.0–0.7)
Eosinophils Relative: 1.7 % (ref 0.0–5.0)
HCT: 40.1 % (ref 36.0–46.0)
Hemoglobin: 14 g/dL (ref 12.0–15.0)
Lymphocytes Relative: 30.2 % (ref 12.0–46.0)
Lymphs Abs: 1.9 10*3/uL (ref 0.7–4.0)
MCHC: 34.8 g/dL (ref 30.0–36.0)
MCV: 90.8 fl (ref 78.0–100.0)
Monocytes Absolute: 0.4 10*3/uL (ref 0.1–1.0)
Monocytes Relative: 6.4 % (ref 3.0–12.0)
Neutro Abs: 3.8 10*3/uL (ref 1.4–7.7)
Neutrophils Relative %: 60.7 % (ref 43.0–77.0)
Platelets: 151 10*3/uL (ref 150.0–400.0)
RBC: 4.41 Mil/uL (ref 3.87–5.11)
RDW: 13.7 % (ref 11.5–15.5)
WBC: 6.3 10*3/uL (ref 4.0–10.5)

## 2021-09-24 LAB — LIPID PANEL
Cholesterol: 108 mg/dL (ref 0–200)
HDL: 40.4 mg/dL (ref >39.00)
LDL Cholesterol: 50 mg/dL (ref 0–99)
NonHDL: 67.97
Total CHOL/HDL Ratio: 3
Triglycerides: 88 mg/dL (ref 0.0–149.0)
VLDL: 17.6 mg/dL (ref 0.0–40.0)

## 2021-09-24 LAB — BASIC METABOLIC PANEL
BUN: 19 mg/dL (ref 6–23)
CO2: 25 mEq/L (ref 19–32)
Calcium: 10.4 mg/dL (ref 8.4–10.5)
Chloride: 110 mEq/L (ref 96–112)
Creatinine, Ser: 0.79 mg/dL (ref 0.40–1.20)
GFR: 77.72 mL/min (ref >60.00)
Glucose, Bld: 96 mg/dL (ref 70–99)
Potassium: 4.1 mEq/L (ref 3.5–5.1)
Sodium: 141 mEq/L (ref 135–145)

## 2021-09-24 LAB — HEPATIC FUNCTION PANEL
ALT: 37 U/L — ABNORMAL HIGH (ref 0–35)
AST: 29 U/L (ref 0–37)
Albumin: 4 g/dL (ref 3.5–5.2)
Alkaline Phosphatase: 58 U/L (ref 39–117)
Bilirubin, Direct: 0.1 mg/dL (ref 0.0–0.3)
Total Bilirubin: 0.7 mg/dL (ref 0.2–1.2)
Total Protein: 6.3 g/dL (ref 6.0–8.3)

## 2021-09-24 LAB — TSH: TSH: 1.32 u[IU]/mL (ref 0.35–5.50)

## 2021-09-24 NOTE — Progress Notes (Signed)
aroPatient ID: Mary Walter, female   DOB: June 15, 1956, 66 y.o.   MRN: [id number] ? ? ?Subjective:  ? ? [name], female    DOB: 09/19/1955, 66 y.o.   MRN: [id number] ? ?This visit occurred during the SARS-CoV-2 public health emergency.  Safety protocols were in place, including screening questions prior to the visit, additional usage of staff PPE, and extensive cleaning of exam room while observing appropriate contact time as indicated for disinfecting solutions.  ? ?Patient here for her physical exam.  ? ?Chief Complaint  ?Patient presents with  ? Annual Exam  ?  CPE - no pap  ? .  ? ?HPI ?Increased stress - husband in ICU.  Doing some better.  Will probably need rehab.  Handling stress relatively well.  No change in cardiac symptoms.  No cough or congestion.  No acid reflux.  No abdominal pain.  Bowels moving.  Saw Dr Bary Castilla 08/28/21- stable.  Recommended continuing tamoxifen.  F/u with Dr Lysle Pearl in one year.  Some snoring.  Discussed sleep apnea and evaluation with [facility].  Will notify me when agreeable.  Will avoid otc sedating medication   ? ? ?Past Medical History:  ?Diagnosis Date  ? Breast cancer ([place])   ? Chest pain   ? a. 10/2015 MV: abnl ECG w/ inf/antlat ST depression w/ exercise (in setting of LVH @ baseline). No ischemia/infarct.  ? Dyspnea   ? WITH EXERTION DUE TO ENLARGED HEART  ? Family history of adverse reaction to anesthesia   ? SON-LUNGS FILLED UP WITH FLUID AFTER APPENDECTOMY  ? Hematuria   ? Hypercholesterolemia   ? Hypertension   ? Hypertrophic cardiomegaly   ? a. 10/2019 Echo: EF 70-75%, mod LVH. No rwma. Mild systolic anterior motion of the mitral valve with mild LVOT obstruction (peak gradient 20 mmHg at rest and 26 mmHg with Valsalva).  Normal RV systolic function.  Normal PASP.  Mildly dilated LA.  ? Personal history of radiation therapy   ? Systolic murmur   ? ?Past Surgical History:  ?Procedure Laterality Date  ? BREAST BIOPSY Left 10/27/2019  ? Affirm bx-distortion-      clip-path ADH  ? BREAST BIOPSY Left 01/07/2020  ? Affim bx 1 area/ x clip and ribbon clip/ ADH  ? BREAST LUMPECTOMY    ? BREAST LUMPECTOMY WITH NEEDLE LOCALIZATION Left 02/14/2020  ? Procedure: BREAST LUMPECTOMY WITH NEEDLE LOCALIZATION;  Surgeon: Robert Bellow, MD;  Location: [facility] ORS;  Service: General;  Laterality: Left;  ? CHOLECYSTECTOMY  10/2020  ? FACIAL FRACTURE SURGERY    ? X 2  ? ?Family History  ?Problem Relation Age of Onset  ? Diabetes Mother   ? Heart disease Mother   ? Hypertension Mother   ? Cancer Father   ?     prostate   ? Diverticulitis Father   ? CVA Father   ? Heart disease Father   ? Stroke Father   ? Hypertension Father   ? Heart murmur Father   ? Heart attack Brother   ? CVA Paternal Grandmother   ? Breast cancer Cousin   ? ?Social History  ? ?Socioeconomic History  ? Marital status: Married  ?  Spouse name: Not on file  ? Number of children: 2  ? Years of education: Not on file  ? Highest education level: Not on file  ?Occupational History  ? Not on file  ?Tobacco Use  ? Smoking status: Never  ? Smokeless tobacco: Never  ?  Vaping Use  ? Vaping Use: Never used  ?Substance and Sexual Activity  ? Alcohol use: No  ?  Alcohol/week: 0.0 standard drinks  ? Drug use: No  ? Sexual activity: Not Currently  ?Other Topics Concern  ? Not on file  ?Social History Narrative  ? Not on file  ? ?Social Determinants of Health  ? ?Financial Resource Strain: Not on file  ?Food Insecurity: Not on file  ?Transportation Needs: Not on file  ?Physical Activity: Not on file  ?Stress: Not on file  ?Social Connections: Not on file  ? ? ? ?Review of Systems  ?Constitutional:  Negative for appetite change and unexpected weight change.  ?HENT:  Negative for congestion, sinus pressure and sore throat.   ?Eyes:  Negative for pain and visual disturbance.  ?Respiratory:  Negative for cough, chest tightness and shortness of breath.   ?Cardiovascular:  Negative for chest pain, palpitations and leg swelling.   ?Gastrointestinal:  Negative for abdominal pain, diarrhea, nausea and vomiting.  ?Genitourinary:  Negative for difficulty urinating and dysuria.  ?Musculoskeletal:  Negative for joint swelling and myalgias.  ?Skin:  Negative for color change and rash.  ?Neurological:  Negative for dizziness, light-headedness and headaches.  ?Hematological:  Negative for adenopathy. Does not bruise/bleed easily.  ?Psychiatric/Behavioral:  Negative for agitation and dysphoric mood.   ?     Increased stress as outlined.   ? ?   ?Objective:  ?  ? ?BP 122/70 (BP Location: Left Arm, Patient Position: Sitting, Cuff Size: Small)   Pulse 64   Temp 97.9 ?F (36.6 ?C) (Temporal)   Resp (!) 21   Ht '5\' 5"'$  (1.651 m)   Wt 167 lb 12.8 oz (76.1 kg)   LMP 06/24/2010   SpO2 99%   BMI 27.92 kg/m?  ?Wt Readings from Last 3 Encounters:  ?09/24/21 167 lb 12.8 oz (76.1 kg)  ?05/24/21 166 lb 2 oz (75.4 kg)  ?05/03/21 164 lb 14.4 oz (74.8 kg)  ? ? ?Physical Exam ?Vitals reviewed.  ?Constitutional:   ?   General: She is not in acute distress. ?   Appearance: Normal appearance. She is well-developed.  ?HENT:  ?   Head: Normocephalic and atraumatic.  ?   Right Ear: External ear normal.  ?   Left Ear: External ear normal.  ?Eyes:  ?   General: No scleral icterus.    ?   Right eye: No discharge.     ?   Left eye: No discharge.  ?   Conjunctiva/sclera: Conjunctivae normal.  ?Neck:  ?   Thyroid: No thyromegaly.  ?Cardiovascular:  ?   Rate and Rhythm: Normal rate and regular rhythm.  ?   Comments: 3-4/7 systolic murmur.  ?Pulmonary:  ?   Effort: No tachypnea, accessory muscle usage or respiratory distress.  ?   Breath sounds: Normal breath sounds. No decreased breath sounds or wheezing.  ?Chest:  ?Breasts: ?   Right: No inverted nipple, mass, nipple discharge or tenderness (no axillary adenopathy).  ?   Left: No inverted nipple, mass, nipple discharge or tenderness (no axilarry adenopathy).  ?Abdominal:  ?   General: Bowel sounds are normal.  ?   Palpations:  Abdomen is soft.  ?   Tenderness: There is no abdominal tenderness.  ?Musculoskeletal:     ?   General: No swelling or tenderness.  ?   Cervical back: Neck supple.  ?Lymphadenopathy:  ?   Cervical: No cervical adenopathy.  ?Skin: ?   Findings: No erythema  or rash.  ?Neurological:  ?   Mental Status: She is alert and oriented to person, place, and time.  ?Psychiatric:     ?   Mood and Affect: Mood normal.     ?   Behavior: Behavior normal.  ? ? ? ?Outpatient Encounter Medications as of 09/24/2021  ?Medication Sig  ? aspirin EC 81 MG EC tablet Take 1 tablet (81 mg total) by mouth daily.  ? atorvastatin (LIPITOR) 40 MG tablet Take 1 tablet (40 mg total) by mouth daily.  ? Calcium Carbonate-Vitamin D3 600-400 MG-UNIT TABS Take by mouth.  ? Magnesium 250 MG TABS Take by mouth.  ? metoprolol succinate (TOPROL XL) 25 MG 24 hr tablet Take 1 tablet (25 mg total) by mouth daily. May take additional half pill for chest tightness  ? Multiple Vitamin (MULTIVITAMIN) tablet Take 1 tablet by mouth daily.  ? sertraline (ZOLOFT) 50 MG tablet Take 1 tablet by mouth once daily  ? tamoxifen (NOLVADEX) 20 MG tablet Take 20 mg by mouth daily.  ? Vitamin D, Cholecalciferol, 10 MCG (400 UNIT) CAPS Take by mouth.  ? ?No facility-administered encounter medications on file as of 09/24/2021.  ?  ? ?Lab Results  ?Component Value Date  ? WBC 6.3 09/24/2021  ? HGB 14.0 09/24/2021  ? HCT 40.1 09/24/2021  ? PLT 151.0 09/24/2021  ? GLUCOSE 96 09/24/2021  ? CHOL 108 09/24/2021  ? TRIG 88.0 09/24/2021  ? HDL 40.40 09/24/2021  ? LDLDIRECT 57 01/19/2020  ? [place] 50 09/24/2021  ? ALT 37 (H) 09/24/2021  ? AST 29 09/24/2021  ? NA 141 09/24/2021  ? K 4.1 09/24/2021  ? CL 110 09/24/2021  ? CREATININE 0.79 09/24/2021  ? BUN 19 09/24/2021  ? CO2 25 09/24/2021  ? TSH 1.32 09/24/2021  ? INR 1.0 11/19/2019  ? HGBA1C 5.2 03/30/2013  ? ? ?MM DIAG BREAST TOMO BILATERAL ? ?Result Date: 08/14/2021 ?CLINICAL DATA:  Personal history of left breast cancer status post  lumpectomy 2020 EXAM: DIGITAL DIAGNOSTIC BILATERAL MAMMOGRAM WITH TOMOSYNTHESIS AND CAD TECHNIQUE: Bilateral digital diagnostic mammography and breast tomosynthesis was performed. The images were evaluated with computer-aided d

## 2021-09-25 ENCOUNTER — Other Ambulatory Visit: Payer: Self-pay

## 2021-09-25 DIAGNOSIS — R748 Abnormal levels of other serum enzymes: Secondary | ICD-10-CM

## 2021-09-25 LAB — HEPATITIS C ANTIBODY
Hepatitis C Ab: NONREACTIVE
SIGNAL TO CUT-OFF: 0.05 (ref <1.00)

## 2021-09-29 ENCOUNTER — Encounter: Payer: Self-pay | Admitting: Internal Medicine

## 2021-09-29 NOTE — Assessment & Plan Note (Addendum)
Recheck cbc to confirm platelet count stable.   ?

## 2021-09-29 NOTE — Assessment & Plan Note (Signed)
S/p excision and XRT.  Followed by Dr Byrnett.  Last evaluated 08/14/21 - Birads II.   Recommended f/u in one year.  

## 2021-09-29 NOTE — Assessment & Plan Note (Signed)
Has seen cardiology. On metoprolol.  Follow.  

## 2021-09-29 NOTE — Assessment & Plan Note (Signed)
Continues on zoloft.  Overall appears to be handling things well.  Does not feel needs any further intervention.  Follow.  

## 2021-09-29 NOTE — Assessment & Plan Note (Signed)
Low cholesterol diet and exercise.  Continue lipitor. Follow lipid panel and liver function tests.   

## 2021-09-29 NOTE — Assessment & Plan Note (Signed)
Continues on metoprolol and lipitor.  No chest pain.  Breathing stable.  Continue risk factor modification.  ?

## 2021-09-29 NOTE — Assessment & Plan Note (Signed)
Physical today 09/24/21.  Colonoscopy 11/2016 - few small polyps and internal hemorrhoids.  Recommended f/u colonoscopy in 10 years.  Being followed by Dr Bary Castilla for her breast.  ?

## 2021-09-29 NOTE — Assessment & Plan Note (Signed)
Continue metoprolol.  Blood pressure as outlined.  No changes in medication.  Follow pressures.  Follow metabolic panel.  

## 2021-09-29 NOTE — Assessment & Plan Note (Signed)
Dr Bary Castilla noticed on exam.  In reviewing it appears has been documented previously.  Sees cardiology. ECHO 2021:  Left ventricular ejection fraction, by estimation, is 70 to 75%. The left ventricle has ?hyperdynamic function. The left ventricle has no regional wall motion abnormalities. There ?is moderate left ventricular hypertrophy. Left ventricular diastolic parameters are ?indeterminate. Mild systolic anterior motion of the mitral valve with mild LVOT obstruction. ?Peak gradient of 20 mm Hg at rest and 26 mm Hg with Valsalva. ?2. Right ventricular systolic function is normal. The right ventricular size is normal. There is ?normal pulmonary artery systolic pressure. ?3. Left atrial size was mildly dilated. ?4. The mitral valve is normal in structure. No evidence of mitral valve regurgitation. No ?evidence of mitral stenosis. ?5. The aortic valve is normal in structure. Aortic valve regurgitation is not visualized. No aortic ?stenosis is present. ?6. The inferior vena cava is normal in size with greater than 50% respiratory variability, ?suggesting right atrial pressure of 3 mmHg. ?

## 2021-09-29 NOTE — Assessment & Plan Note (Signed)
Evaluated by urology - 02/28/21 - cystoscopy - normal.  Follow urine. CT urogram if hematuria worsens or if develops gross hematuria.  F/u two years.  

## 2021-10-26 ENCOUNTER — Other Ambulatory Visit: Payer: Medicare HMO

## 2021-11-06 ENCOUNTER — Other Ambulatory Visit (INDEPENDENT_AMBULATORY_CARE_PROVIDER_SITE_OTHER): Payer: Medicare HMO

## 2021-11-06 DIAGNOSIS — R748 Abnormal levels of other serum enzymes: Secondary | ICD-10-CM | POA: Diagnosis not present

## 2021-11-06 LAB — HEPATIC FUNCTION PANEL
ALT: 39 U/L — ABNORMAL HIGH (ref 0–35)
AST: 30 U/L (ref 0–37)
Albumin: 3.9 g/dL (ref 3.5–5.2)
Alkaline Phosphatase: 60 U/L (ref 39–117)
Bilirubin, Direct: 0.2 mg/dL (ref 0.0–0.3)
Total Bilirubin: 0.7 mg/dL (ref 0.2–1.2)
Total Protein: 6 g/dL (ref 6.0–8.3)

## 2021-11-07 ENCOUNTER — Other Ambulatory Visit: Payer: Self-pay

## 2021-11-07 DIAGNOSIS — R748 Abnormal levels of other serum enzymes: Secondary | ICD-10-CM

## 2021-11-07 DIAGNOSIS — I10 Essential (primary) hypertension: Secondary | ICD-10-CM

## 2021-11-07 DIAGNOSIS — E782 Mixed hyperlipidemia: Secondary | ICD-10-CM

## 2021-11-07 DIAGNOSIS — D696 Thrombocytopenia, unspecified: Secondary | ICD-10-CM

## 2022-01-02 ENCOUNTER — Other Ambulatory Visit (INDEPENDENT_AMBULATORY_CARE_PROVIDER_SITE_OTHER): Payer: Medicare HMO

## 2022-01-02 DIAGNOSIS — E782 Mixed hyperlipidemia: Secondary | ICD-10-CM | POA: Diagnosis not present

## 2022-01-02 DIAGNOSIS — I10 Essential (primary) hypertension: Secondary | ICD-10-CM

## 2022-01-02 DIAGNOSIS — R748 Abnormal levels of other serum enzymes: Secondary | ICD-10-CM | POA: Diagnosis not present

## 2022-01-02 DIAGNOSIS — D696 Thrombocytopenia, unspecified: Secondary | ICD-10-CM

## 2022-01-02 LAB — BASIC METABOLIC PANEL
BUN: 18 mg/dL (ref 6–23)
CO2: 25 mEq/L (ref 19–32)
Calcium: 9.4 mg/dL (ref 8.4–10.5)
Chloride: 112 mEq/L (ref 96–112)
Creatinine, Ser: 0.87 mg/dL (ref 0.40–1.20)
GFR: 69.09 mL/min (ref >60.00)
Glucose, Bld: 88 mg/dL (ref 70–99)
Potassium: 3.9 mEq/L (ref 3.5–5.1)
Sodium: 141 mEq/L (ref 135–145)

## 2022-01-02 LAB — CBC WITH DIFFERENTIAL/PLATELET
Basophils Absolute: 0.1 10*3/uL (ref 0.0–0.1)
Basophils Relative: 1.3 % (ref 0.0–3.0)
Eosinophils Absolute: 0.1 10*3/uL (ref 0.0–0.7)
Eosinophils Relative: 2.1 % (ref 0.0–5.0)
HCT: 38.6 % (ref 36.0–46.0)
Hemoglobin: 13 g/dL (ref 12.0–15.0)
Lymphocytes Relative: 33.2 % (ref 12.0–46.0)
Lymphs Abs: 1.9 10*3/uL (ref 0.7–4.0)
MCHC: 33.7 g/dL (ref 30.0–36.0)
MCV: 93.1 fl (ref 78.0–100.0)
Monocytes Absolute: 0.4 10*3/uL (ref 0.1–1.0)
Monocytes Relative: 6.8 % (ref 3.0–12.0)
Neutro Abs: 3.2 10*3/uL (ref 1.4–7.7)
Neutrophils Relative %: 56.6 % (ref 43.0–77.0)
Platelets: 109 10*3/uL — ABNORMAL LOW (ref 150.0–400.0)
RBC: 4.15 Mil/uL (ref 3.87–5.11)
RDW: 13.7 % (ref 11.5–15.5)
WBC: 5.6 10*3/uL (ref 4.0–10.5)

## 2022-01-02 LAB — LIPID PANEL
Cholesterol: 108 mg/dL (ref 0–200)
HDL: 39.9 mg/dL (ref >39.00)
LDL Cholesterol: 55 mg/dL (ref 0–99)
NonHDL: 68.59
Total CHOL/HDL Ratio: 3
Triglycerides: 70 mg/dL (ref 0.0–149.0)
VLDL: 14 mg/dL (ref 0.0–40.0)

## 2022-01-02 LAB — HEPATIC FUNCTION PANEL
ALT: 33 U/L (ref 0–35)
AST: 27 U/L (ref 0–37)
Albumin: 3.8 g/dL (ref 3.5–5.2)
Alkaline Phosphatase: 57 U/L (ref 39–117)
Bilirubin, Direct: 0.1 mg/dL (ref 0.0–0.3)
Total Bilirubin: 0.6 mg/dL (ref 0.2–1.2)
Total Protein: 5.8 g/dL — ABNORMAL LOW (ref 6.0–8.3)

## 2022-01-08 ENCOUNTER — Other Ambulatory Visit: Payer: Self-pay

## 2022-01-08 ENCOUNTER — Encounter: Payer: Self-pay | Admitting: Internal Medicine

## 2022-01-08 ENCOUNTER — Ambulatory Visit (INDEPENDENT_AMBULATORY_CARE_PROVIDER_SITE_OTHER): Payer: Medicare HMO | Admitting: Internal Medicine

## 2022-01-08 ENCOUNTER — Telehealth: Payer: Self-pay

## 2022-01-08 VITALS — BP 122/68 | HR 65 | Temp 97.8°F | Resp 21 | Ht 65.0 in | Wt 170.4 lb

## 2022-01-08 DIAGNOSIS — H0289 Other specified disorders of eyelid: Secondary | ICD-10-CM | POA: Diagnosis not present

## 2022-01-08 DIAGNOSIS — I25118 Atherosclerotic heart disease of native coronary artery with other forms of angina pectoris: Secondary | ICD-10-CM

## 2022-01-08 DIAGNOSIS — R0789 Other chest pain: Secondary | ICD-10-CM | POA: Insufficient documentation

## 2022-01-08 DIAGNOSIS — D0512 Intraductal carcinoma in situ of left breast: Secondary | ICD-10-CM | POA: Diagnosis not present

## 2022-01-08 DIAGNOSIS — I421 Obstructive hypertrophic cardiomyopathy: Secondary | ICD-10-CM

## 2022-01-08 DIAGNOSIS — D696 Thrombocytopenia, unspecified: Secondary | ICD-10-CM | POA: Diagnosis not present

## 2022-01-08 DIAGNOSIS — F439 Reaction to severe stress, unspecified: Secondary | ICD-10-CM

## 2022-01-08 DIAGNOSIS — I10 Essential (primary) hypertension: Secondary | ICD-10-CM | POA: Diagnosis not present

## 2022-01-08 DIAGNOSIS — R319 Hematuria, unspecified: Secondary | ICD-10-CM

## 2022-01-08 DIAGNOSIS — R69 Illness, unspecified: Secondary | ICD-10-CM | POA: Diagnosis not present

## 2022-01-08 DIAGNOSIS — E782 Mixed hyperlipidemia: Secondary | ICD-10-CM | POA: Diagnosis not present

## 2022-01-08 LAB — PLATELET COUNT: Platelets: 151 10*3/uL (ref 140–400)

## 2022-01-08 NOTE — Telephone Encounter (Signed)
Error

## 2022-01-08 NOTE — Progress Notes (Unsigned)
Patient ID: JACULIN RASMUS, female   DOB: 1956-04-10, 66 y.o.   MRN: [id number]   Subjective:    Patient ID: VENESHA PETRAITIS, female    DOB: [birth year], 66 y.o.   MRN: [id number]   Patient here for a scheduled follow up.   Chief Complaint  Patient presents with   Hypertension   .   HPI Reports increased fatigue.  States she has noticed increased fatigue, chest pressure and left arm sensation change with increased exertion. Does not feel like going out and doing things.  Some drainage, but no significant chest congestion or cough.  No acid reflux.  Sees Dr Rockey Situ.  Last evaluated 03/2021.  Note reviewed.  Also reports noticing - left eye - lower lid - localized erythema - question of sty.  No headache. No rash.  Increased stress.  Husband is doing some better.     Past Medical History:  Diagnosis Date   Breast cancer ([place])    Chest pain    a. 10/2015 MV: abnl ECG w/ inf/antlat ST depression w/ exercise (in setting of LVH @ baseline). No ischemia/infarct.   Dyspnea    WITH EXERTION DUE TO ENLARGED HEART   Family history of adverse reaction to anesthesia    SON-LUNGS FILLED UP WITH FLUID AFTER APPENDECTOMY   Hematuria    Hypercholesterolemia    Hypertension    Hypertrophic cardiomegaly    a. 10/2019 Echo: EF 70-75%, mod LVH. No rwma. Mild systolic anterior motion of the mitral valve with mild LVOT obstruction (peak gradient 20 mmHg at rest and 26 mmHg with Valsalva).  Normal RV systolic function.  Normal PASP.  Mildly dilated LA.   Personal history of radiation therapy    Systolic murmur    Past Surgical History:  Procedure Laterality Date   BREAST BIOPSY Left 10/27/2019   Affirm bx-distortion-     clip-path ADH   BREAST BIOPSY Left 01/07/2020   Affim bx 1 area/ x clip and ribbon clip/ ADH   BREAST LUMPECTOMY     BREAST LUMPECTOMY WITH NEEDLE LOCALIZATION Left 02/14/2020   Procedure: BREAST LUMPECTOMY WITH NEEDLE LOCALIZATION;  Surgeon: Robert Bellow, MD;  Location:  [facility] ORS;  Service: General;  Laterality: Left;   CHOLECYSTECTOMY  10/2020   FACIAL FRACTURE SURGERY     X 2   Family History  Problem Relation Age of Onset   Diabetes Mother    Heart disease Mother    Hypertension Mother    Cancer Father        prostate    Diverticulitis Father    CVA Father    Heart disease Father    Stroke Father    Hypertension Father    Heart murmur Father    Heart attack Brother    CVA Paternal Grandmother    Breast cancer Cousin    Social History   Socioeconomic History   Marital status: Married    Spouse name: Not on file   Number of children: 2   Years of education: Not on file   Highest education level: Not on file  Occupational History   Not on file  Tobacco Use   Smoking status: Never   Smokeless tobacco: Never  Vaping Use   Vaping Use: Never used  Substance and Sexual Activity   Alcohol use: No    Alcohol/week: 0.0 standard drinks of alcohol   Drug use: No   Sexual activity: Not Currently  Other Topics Concern   Not on file  Social History Narrative   Not on file   Social Determinants of Health   Financial Resource Strain: Not on file  Food Insecurity: Not on file  Transportation Needs: Not on file  Physical Activity: Not on file  Stress: Not on file  Social Connections: Not on file     Review of Systems  Constitutional:  Positive for fatigue. Negative for appetite change and unexpected weight change.  HENT:  Positive for postnasal drip. Negative for sinus pressure.   Respiratory:  Negative for cough.        Some sob with exertion.   Cardiovascular:  Negative for palpitations and leg swelling.       Describes chest pressure as outlined.   Gastrointestinal:  Negative for abdominal pain, diarrhea, nausea and vomiting.  Genitourinary:  Negative for difficulty urinating and dysuria.  Musculoskeletal:  Negative for joint swelling and myalgias.  Skin:  Negative for color change and rash.  Neurological:  Negative for  dizziness and headaches.  Psychiatric/Behavioral:  Negative for agitation and dysphoric mood.        Objective:     BP 122/68   Pulse 65   Temp 97.8 F (36.6 C) (Oral)   Resp (!) 21   Ht '5\' 5"'$  (1.651 m)   Wt 170 lb 6.4 oz (77.3 kg)   LMP 06/24/2010   SpO2 97%   BMI 28.36 kg/m  Wt Readings from Last 3 Encounters:  01/08/22 170 lb 6.4 oz (77.3 kg)  09/24/21 167 lb 12.8 oz (76.1 kg)  05/24/21 166 lb 2 oz (75.4 kg)    Physical Exam Vitals reviewed.  Constitutional:      General: She is not in acute distress.    Appearance: Normal appearance.  HENT:     Head: Normocephalic and atraumatic.     Right Ear: External ear normal.     Left Ear: External ear normal.  Eyes:     General: No scleral icterus.       Right eye: No discharge.        Left eye: No discharge.     Conjunctiva/sclera: Conjunctivae normal.  Neck:     Thyroid: No thyromegaly.  Cardiovascular:     Rate and Rhythm: Normal rate and regular rhythm.     Comments: 1/6 systolic murmur Pulmonary:     Effort: No respiratory distress.     Breath sounds: Normal breath sounds. No wheezing.  Abdominal:     General: Bowel sounds are normal.     Palpations: Abdomen is soft.     Tenderness: There is no abdominal tenderness.  Musculoskeletal:        General: No swelling or tenderness.     Cervical back: Neck supple. No tenderness.  Lymphadenopathy:     Cervical: No cervical adenopathy.  Skin:    Findings: No erythema or rash.  Neurological:     Mental Status: She is alert.  Psychiatric:        Mood and Affect: Mood normal.        Behavior: Behavior normal.      Outpatient Encounter Medications as of 01/08/2022  Medication Sig   aspirin EC 81 MG EC tablet Take 1 tablet (81 mg total) by mouth daily.   atorvastatin (LIPITOR) 40 MG tablet Take 1 tablet (40 mg total) by mouth daily.   Calcium Carbonate-Vitamin D3 600-400 MG-UNIT TABS Take by mouth.   Magnesium 250 MG TABS Take by mouth.   metoprolol succinate  (TOPROL XL) 25 MG 24 hr  tablet Take 1 tablet (25 mg total) by mouth daily. May take additional half pill for chest tightness   Multiple Vitamin (MULTIVITAMIN) tablet Take 1 tablet by mouth daily.   sertraline (ZOLOFT) 50 MG tablet Take 1 tablet by mouth once daily   tamoxifen (NOLVADEX) 20 MG tablet Take 20 mg by mouth daily.   tobramycin-dexamethasone (TOBRADEX) ophthalmic ointment Place 1 Application into the left eye 2 (two) times daily.   Vitamin D, Cholecalciferol, 10 MCG (400 UNIT) CAPS Take by mouth.   No facility-administered encounter medications on file as of 01/08/2022.     Lab Results  Component Value Date   WBC 5.6 01/02/2022   HGB 13.0 01/02/2022   HCT 38.6 01/02/2022   PLT 151 01/08/2022   GLUCOSE 88 01/02/2022   CHOL 108 01/02/2022   TRIG 70.0 01/02/2022   HDL 39.90 01/02/2022   LDLDIRECT 57 01/19/2020   LDLCALC 55 01/02/2022   ALT 33 01/02/2022   AST 27 01/02/2022   NA 141 01/02/2022   K 3.9 01/02/2022   CL 112 01/02/2022   CREATININE 0.87 01/02/2022   BUN 18 01/02/2022   CO2 25 01/02/2022   TSH 1.32 09/24/2021   INR 1.0 11/19/2019   HGBA1C 5.2 03/30/2013    MM DIAG BREAST TOMO BILATERAL  Result Date: 08/14/2021 CLINICAL DATA:  Personal history of left breast cancer status post lumpectomy 2020 EXAM: DIGITAL DIAGNOSTIC BILATERAL MAMMOGRAM WITH TOMOSYNTHESIS AND CAD TECHNIQUE: Bilateral digital diagnostic mammography and breast tomosynthesis was performed. The images were evaluated with computer-aided detection. COMPARISON:  Prior films ACR Breast Density Category c: The breast tissue is heterogeneously dense, which may obscure small masses. FINDINGS: Cc and MLO views of bilateral breasts, spot tangential view left breast submitted. Postsurgical changes are identified in the left breast. No suspicious abnormalities identified bilaterally. IMPRESSION: Benign findings. RECOMMENDATION: Bilateral diagnostic mammogram 1 year. I have discussed the findings and  recommendations with the patient. If applicable, a reminder letter will be sent to the patient regarding the next appointment. BI-RADS CATEGORY  2: Benign. Electronically Signed   By: Abelardo Diesel M.D.   On: 08/14/2021 10:19      Assessment & Plan:   Problem List Items Addressed This Visit     Chest pressure    Chest pressure and symptoms as outlined.  EKG as outlined.  Sees Dr Rockey Situ.  Given symptoms and history, get her back in with cardiology to confirm if further cardiac testing is warranted.  Discussed if any change or worsening symptoms or persistent symptoms, needs to be evaluated.        Relevant Orders   EKG 12-Lead (Completed)   Coronary artery disease of native artery of native heart with stable angina pectoris ([place])    Continues on metoprolol and lipitor.  Reports the chest pressure, fatigue and sob with exertion as outlined.  EKG Normal sinus rhythm LVH, diffuse ST-T wave abnormality - no significant change when compared to previous.  Given symptoms and history, will get her back in with cardiology to determine of further testing warranted.  Continue risk factor modification.       DCIS (ductal carcinoma in situ)    S/p excision and XRT.  Followed by Dr Bary Castilla.  Last evaluated 08/14/21 - Birads II.   Recommended f/u in one year.       Essential hypertension    Continue metoprolol.  Blood pressure as outlined.  No changes in medication.  Follow pressures.  Follow metabolic panel.  Hematuria    Evaluated by urology - 02/28/21 - cystoscopy - normal.  Follow urine. CT urogram if hematuria worsens or if develops gross hematuria.  F/u two years.       Hyperlipidemia    Low cholesterol diet and exercise.  Continue lipitor.  Follow lipid panel and liver function tests.   Lab Results  Component Value Date   CHOL 108 01/02/2022   HDL 39.90 01/02/2022   LDLCALC 55 01/02/2022   LDLDIRECT 57 01/19/2020   TRIG 70.0 01/02/2022   CHOLHDL 3 01/02/2022       Irritation of  eyelid    Question - sty.  tobradex ointment sent in.  F/u with ophthalmology as discussed.       Obstructive hypertrophic cardiomyopathy ([place])    Has seen cardiology. On metoprolol.  Follow.       Stress    Continues on zoloft.  Overall appears to be handling things well.  Does not feel needs any further intervention.  Follow.       Thrombocytopenia ([facility]) - Primary    Platelet count decreased on recent lab check - 109.   Recheck today.        Relevant Orders   Platelet count (Completed)     Einar Pheasant, MD

## 2022-01-09 ENCOUNTER — Encounter: Payer: Self-pay | Admitting: Internal Medicine

## 2022-01-09 ENCOUNTER — Telehealth: Payer: Self-pay | Admitting: Cardiovascular Disease

## 2022-01-09 ENCOUNTER — Telehealth: Payer: Self-pay | Admitting: Internal Medicine

## 2022-01-09 DIAGNOSIS — H0289 Other specified disorders of eyelid: Secondary | ICD-10-CM | POA: Insufficient documentation

## 2022-01-09 MED ORDER — TOBRADEX 0.3-0.1 % OP OINT: 1.0000 | TOPICAL_OINTMENT | Freq: Two times a day (BID) | OPHTHALMIC | 0 refills | Status: DC

## 2022-01-09 NOTE — Telephone Encounter (Signed)
Yes - pt advised of phone conversation with [facility] office. Pt was advised of different medication being sent in for her eye, ointment to be applied to lid as opposed to a drop. Instructed on warm compresses. Pt also stated she will call over to [facility] to make f/u - doesn't think she needs referral.  I did also ask pt just in case, if she has not heard from Dr Gwenyth Ober office within the next week or so, to please call me to let me know. Pt agreeable to all of the above.

## 2022-01-09 NOTE — Telephone Encounter (Signed)
[facility] called and wanted to schedule patient an appt asap due to patient having chest pain. Office said call patient back to discuss

## 2022-01-09 NOTE — Telephone Encounter (Signed)
Please hold message or make note to confirm appt scheduled.  Also, was Ms Mcmannis notified of remainder of message.

## 2022-01-09 NOTE — Assessment & Plan Note (Signed)
Evaluated by urology - 02/28/21 - cystoscopy - normal.  Follow urine. CT urogram if hematuria worsens or if develops gross hematuria.  F/u two years.

## 2022-01-09 NOTE — Assessment & Plan Note (Signed)
Continues on metoprolol and lipitor.  Reports the chest pressure, fatigue and sob with exertion as outlined.  EKG Normal sinus rhythm LVH, diffuse ST-T wave abnormality - no significant change when compared to previous.  Given symptoms and history, will get her back in with cardiology to determine of further testing warranted.  Continue risk factor modification.

## 2022-01-09 NOTE — Assessment & Plan Note (Signed)
Chest pressure and symptoms as outlined.  EKG as outlined.  Sees Dr Rockey Situ.  Given symptoms and history, get her back in with cardiology to confirm if further cardiac testing is warranted.  Discussed if any change or worsening symptoms or persistent symptoms, needs to be evaluated.

## 2022-01-09 NOTE — Assessment & Plan Note (Signed)
Platelet count decreased on recent lab check - 109.   Recheck today.

## 2022-01-09 NOTE — Telephone Encounter (Signed)
See office note.  Rx canceled.  Prescribed a different abx ointment.

## 2022-01-09 NOTE — Assessment & Plan Note (Signed)
Continues on zoloft.  Overall appears to be handling things well.  Does not feel needs any further intervention.  Follow.  

## 2022-01-09 NOTE — Assessment & Plan Note (Signed)
S/p excision and XRT.  Followed by Dr Byrnett.  Last evaluated 08/14/21 - Birads II.   Recommended f/u in one year.  

## 2022-01-09 NOTE — Assessment & Plan Note (Signed)
Has seen cardiology. On metoprolol.  Follow.

## 2022-01-09 NOTE — Assessment & Plan Note (Signed)
Question - sty.  tobradex ointment sent in.  F/u with ophthalmology as discussed.

## 2022-01-09 NOTE — Assessment & Plan Note (Signed)
Low cholesterol diet and exercise.  Continue lipitor.  Follow lipid panel and liver function tests.   Lab Results  Component Value Date   CHOL 108 01/02/2022   HDL 39.90 01/02/2022   LDLCALC 55 01/02/2022   LDLDIRECT 57 01/19/2020   TRIG 70.0 01/02/2022   CHOLHDL 3 01/02/2022

## 2022-01-09 NOTE — Telephone Encounter (Signed)
S/w Dr Donivan Scull office - was told the soonest they could see the patient is September with a PA. I was advised that a note was left for Dr. Donivan Scull nurse to see if they can fit her in sooner. Was advised that when Dr Donivan Scull nurse responds to the message, they will call the patient to schedule.

## 2022-01-09 NOTE — Assessment & Plan Note (Signed)
Continue metoprolol.  Blood pressure as outlined.  No changes in medication.  Follow pressures.  Follow metabolic panel.  

## 2022-01-09 NOTE — Telephone Encounter (Signed)
Mary Walter sees Dr Rockey Situ.  Needs a f/u appt scheduled as soon as possible.  She presented with noticed of chest pressure (and left arm sensation), fatigue and sob with exertion.  EKG (Normal sinus rhythm LVH, diffuse ST-T wave abnormality consistent with strain)  - no significant change when compared to previous EKG.  Given symptoms, please schedule appt with cardiology (ok to see PA or NP).  Also notify that I have sent in an eye ointment.  I sent in different abx then previously prescribed.  This will be an ointment to apply to lid rather than eye drops.  Also instruct on warm compresses.  She has been evaluated at [facility] previously.  We had discussed the need for f/u - for eye irritation and eye exam. Please confirm that she wants [place] to go ahead and place order for referral.

## 2022-01-10 NOTE — Telephone Encounter (Signed)
Pt c/o of Chest Pain: STAT if CP now or developed within 24 hours  1. Are you having CP right now? no  2. Are you experiencing any other symptoms (ex. SOB, nausea, vomiting, sweating)? Describes as heaviness Sob fatigue n/v back pain L shoulder blade pain heaviness in L arm   3. How long have you been experiencing CP? A while   4. Is your CP continuous or coming and going? Comes and goes only with exertion patient reports at 4/10 pain she rests and it goes away   5. Have you taken Nitroglycerin? no ?  Spoke with triage ok to schedule  added to DOD slot tomorrow at [place]

## 2022-01-10 NOTE — Telephone Encounter (Addendum)
Spoke with the patient. Patient is currently asymptomatic. Patient is scheduled with our DOD Dr. Garen Lah tomorrow.  Patient given ED precautions. Adv the patient to seek emergent care if symptoms return prior to her scheduled appt. Patient verbalized understanding.

## 2022-01-11 ENCOUNTER — Encounter: Payer: Self-pay | Admitting: Cardiology

## 2022-01-11 ENCOUNTER — Ambulatory Visit (INDEPENDENT_AMBULATORY_CARE_PROVIDER_SITE_OTHER): Payer: Medicare HMO

## 2022-01-11 ENCOUNTER — Ambulatory Visit: Payer: Medicare HMO | Admitting: Cardiology

## 2022-01-11 ENCOUNTER — Other Ambulatory Visit
Admission: RE | Admit: 2022-01-11 | Discharge: 2022-01-11 | Disposition: A | Discharge status: Home / Self Care | Payer: Medicare HMO | Source: Ambulatory Visit | Attending: Cardiology | Admitting: Cardiology

## 2022-01-11 VITALS — BP 138/78 | HR 71 | Ht 65.0 in | Wt 172.1 lb

## 2022-01-11 DIAGNOSIS — I2510 Atherosclerotic heart disease of native coronary artery without angina pectoris: Secondary | ICD-10-CM | POA: Diagnosis not present

## 2022-01-11 DIAGNOSIS — R072 Precordial pain: Secondary | ICD-10-CM

## 2022-01-11 DIAGNOSIS — I421 Obstructive hypertrophic cardiomyopathy: Secondary | ICD-10-CM | POA: Diagnosis not present

## 2022-01-11 LAB — HEMOGLOBIN AND HEMATOCRIT, BLOOD
HCT: 40.2 % (ref 36.0–46.0)
Hemoglobin: 13.5 g/dL (ref 12.0–15.0)

## 2022-01-11 MED ORDER — METOPROLOL SUCCINATE ER 50 MG PO TB24: 50.0000 mg | ORAL_TABLET | Freq: Every day | ORAL | 3 refills | Status: DC

## 2022-01-11 NOTE — Progress Notes (Signed)
Cardiology Office Note:    Date:  01/11/2022   ID:  TALAJAH SLIMP, DOB May 06, 1956, MRN [id number]  PCP:  Einar Pheasant, [place] Providers Cardiologist:  Ida Rogue, MD     Referring MD: Einar Pheasant, MD   Chief Complaint  Patient presents with   Chest Pain    Patient c/o chest pressure, left arm pain that radiates into her left mid-back with any amount of activity. Medications reviewed by the patient verbally.     History of Present Illness:    ANGELYS YETMAN is a 66 y.o. female with a hx of HOCM, nonobstructive CAD CCTA 2021 with mild prox LAD disease), presenting with chest pain.  States having symptoms of chest pain with minimal exertion ongoing over the past several weeks.  Symptoms usually occur when she works outside, associated with radiation down her left arm and back.  Denies dizziness, syncope.  Has a history of HTN, takes Toprol-XL, states her father had HCM.  She denies dizziness, palpitations, syncope.  Takes Toprol-XL 25 mg daily.  Prior notes Echo 10/2019 EF 70%, SAM, LVOT obstruction Coronary CTA 11/2019 calcium score 182, mild proximal LAD disease.  Past Medical History:  Diagnosis Date   Breast cancer ([place])    Chest pain    a. 10/2015 MV: abnl ECG w/ inf/antlat ST depression w/ exercise (in setting of LVH @ baseline). No ischemia/infarct.   Dyspnea    WITH EXERTION DUE TO ENLARGED HEART   Family history of adverse reaction to anesthesia    SON-LUNGS FILLED UP WITH FLUID AFTER APPENDECTOMY   Hematuria    Hypercholesterolemia    Hypertension    Hypertrophic cardiomegaly    a. 10/2019 Echo: EF 70-75%, mod LVH. No rwma. Mild systolic anterior motion of the mitral valve with mild LVOT obstruction (peak gradient 20 mmHg at rest and 26 mmHg with Valsalva).  Normal RV systolic function.  Normal PASP.  Mildly dilated LA.   Personal history of radiation therapy    Systolic murmur     Past Surgical History:  Procedure Laterality  Date   BREAST BIOPSY Left 10/27/2019   Affirm bx-distortion-     clip-path ADH   BREAST BIOPSY Left 01/07/2020   Affim bx 1 area/ x clip and ribbon clip/ ADH   BREAST LUMPECTOMY     BREAST LUMPECTOMY WITH NEEDLE LOCALIZATION Left 02/14/2020   Procedure: BREAST LUMPECTOMY WITH NEEDLE LOCALIZATION;  Surgeon: Robert Bellow, MD;  Location: [facility] ORS;  Service: General;  Laterality: Left;   CHOLECYSTECTOMY  10/2020   FACIAL FRACTURE SURGERY     X 2    Current Medications: Current Meds  Medication Sig   aspirin EC 81 MG EC tablet Take 1 tablet (81 mg total) by mouth daily.   atorvastatin (LIPITOR) 40 MG tablet Take 1 tablet (40 mg total) by mouth daily.   Calcium Carbonate-Vitamin D3 600-400 MG-UNIT TABS Take by mouth.   Magnesium 250 MG TABS Take by mouth.   Multiple Vitamin (MULTIVITAMIN) tablet Take 1 tablet by mouth daily.   sertraline (ZOLOFT) 50 MG tablet Take 1 tablet by mouth once daily   tamoxifen (NOLVADEX) 20 MG tablet Take 20 mg by mouth daily.   tobramycin-dexamethasone (TOBRADEX) ophthalmic ointment Place 1 Application into the left eye 2 (two) times daily.   Vitamin D, Cholecalciferol, 10 MCG (400 UNIT) CAPS Take by mouth.   [DISCONTINUED] metoprolol succinate (TOPROL XL) 25 MG 24 hr tablet Take 1 tablet (25 mg total)  by mouth daily. May take additional half pill for chest tightness     Allergies:   Macrobid [nitrofurantoin macrocrystal] and Penicillins   Social History   Socioeconomic History   Marital status: Married    Spouse name: Not on file   Number of children: 2   Years of education: Not on file   Highest education level: Not on file  Occupational History   Not on file  Tobacco Use   Smoking status: Never   Smokeless tobacco: Never  Vaping Use   Vaping Use: Never used  Substance and Sexual Activity   Alcohol use: No    Alcohol/week: 0.0 standard drinks of alcohol   Drug use: No   Sexual activity: Not Currently  Other Topics Concern   Not on file   Social History Narrative   Not on file   Social Determinants of Health   Financial Resource Strain: Not on file  Food Insecurity: Not on file  Transportation Needs: Not on file  Physical Activity: Not on file  Stress: Not on file  Social Connections: Not on file     Family History: The patient's family history includes Breast cancer in her cousin; CVA in her father and paternal grandmother; Cancer in her father; Diabetes in her mother; Diverticulitis in her father; Heart attack in her brother; Heart disease in her father and mother; Heart murmur in her father; Hypertension in her father and mother; Stroke in her father.  ROS:   Please see the history of present illness.     All other systems reviewed and are negative.  EKGs/Labs/Other Studies Reviewed:    The following studies were reviewed today:   EKG:  EKG is  ordered today.  The ekg ordered today demonstrates sinus rhythm, LVH, heart rate 71  Recent Labs: 09/24/2021: TSH 1.32 01/02/2022: ALT 33; BUN 18; Creatinine, Ser 0.87; Hemoglobin 13.0; Potassium 3.9; Sodium 141 01/08/2022: Platelets 151  Recent Lipid Panel    Component Value Date/Time   CHOL 108 01/02/2022 0840   CHOL 126 05/01/2020 1004   TRIG 70.0 01/02/2022 0840   HDL 39.90 01/02/2022 0840   HDL 44 05/01/2020 1004   CHOLHDL 3 01/02/2022 0840   VLDL 14.0 01/02/2022 0840   LDLCALC 55 01/02/2022 0840   LDLCALC 60 05/01/2020 1004   LDLDIRECT 57 01/19/2020 0920   LDLDIRECT 144.1 03/30/2013 0829     Risk Assessment/Calculations:          Physical Exam:    VS:  BP 138/78 (BP Location: Left Arm, Patient Position: Sitting, Cuff Size: Normal)   Pulse 71   Ht '5\' 5"'$  (1.651 m)   Wt 172 lb 2 oz (78.1 kg)   LMP 06/24/2010   SpO2 98%   BMI 28.64 kg/m     Wt Readings from Last 3 Encounters:  01/11/22 172 lb 2 oz (78.1 kg)  01/08/22 170 lb 6.4 oz (77.3 kg)  09/24/21 167 lb 12.8 oz (76.1 kg)     GEN:  Well nourished, well developed in no acute  distress HEENT: Normal NECK: No JVD; No carotid bruits CARDIAC: RRR, 2/6 systolic murmur RESPIRATORY:  Clear to auscultation without rales, wheezing or rhonchi  ABDOMEN: Soft, non-tender, non-distended MUSCULOSKELETAL:  No edema; No deformity  SKIN: Warm and dry NEUROLOGIC:  Alert and oriented x 3 PSYCHIATRIC:  Normal affect   ASSESSMENT:    1. Precordial pain   2. HOCM (hypertrophic obstructive cardiomyopathy) ([place])   3. Coronary artery disease involving native coronary artery of native  heart without angina pectoris    PLAN:    In order of problems listed above:  Chest pain, history of nonobstructive CAD in 2021.  Repeat coronary CTA to evaluate possible CAD progression.  Continue aspirin, Lipitor 40 mg daily. HOCM, previous echocardiogram reviewed by myself showing SAM and LVOT obstruction.  Target heart rate 55-60.  Increase Toprol-XL to 50 mg daily.  Place cardiac monitor to evaluate any significant arrhythmias.  Obtain CMR to evaluate presence of fibrosis and risk stratification.  Consider referral to [place] [facility] (?[place] consideration). History of nonobstructive CAD, aspirin, Lipitor.     Follow-up in 6 weeks with APP or Dr. Rockey Situ   Medication Adjustments/Labs and Tests Ordered: Current medicines are reviewed at length with the patient today.  Concerns regarding medicines are outlined above.  Orders Placed This Encounter  Procedures   MR CARDIAC MORPHOLOGY W WO CONTRAST   CT CORONARY MORPH W/CTA COR W/SCORE W/CA W/CM &/OR WO/CM   Hemoglobin and hematocrit, blood   LONG TERM MONITOR (3-14 DAYS)   EKG 12-Lead   Meds ordered this encounter  Medications   metoprolol succinate (TOPROL XL) 50 MG 24 hr tablet    Sig: Take 1 tablet (50 mg total) by mouth daily.    Dispense:  30 tablet    Refill:  3    Patient Instructions  Medication Instructions:   Your physician has recommended you make the following change in your medication:    INCREASE your  Metoprolol Succinate (Toprol XL) to 50 MG once a day.  *If you need a refill on your cardiac medications before your next appointment, please call your pharmacy*   Lab Work:  Please go to the [facility] for an H/H lab draw after your appointment today.   Testing/Procedures:  Your physician has requested that you have cardiac CT. Cardiac computed tomography (CT) is a painless test that uses an x-ray machine to take clear, detailed pictures of your heart.    Your cardiac CT will be scheduled at:  [facility]  Monday 01/14/22 at 11:00  Please arrive 15 mins early for check-in and test prep.    Please follow these instructions carefully (unless otherwise directed):    On the Night Before the Test: Be sure to Drink plenty of water. Do not consume any caffeinated/decaffeinated beverages or chocolate 12 hours prior to your test.   On the Day of the Test: Drink plenty of water until 1 hour prior to the test. Do not eat any food 4 hours prior to the test. You may take your regular medications prior to the test.  Take metoprolol succinate 100 MG two hours prior to test. (This is double your normal dose) FEMALES- please wear underwire-free bra if available, avoid dresses & tight clothing        After the Test: Drink plenty of water. After receiving IV contrast, you may experience a mild flushed feeling. This is normal. On occasion, you may experience a mild rash up to 24 hours after the test. This is not dangerous. If this occurs, you can take Benadryl 25 mg and increase your fluid intake. If you experience trouble breathing, this can be serious. If it is severe call 911 IMMEDIATELY. If it is mild, please call our office. If you take any of these medications: Glipizide/Metformin, Avandament, Glucavance, please do not take 48 hours after completing test unless otherwise instructed.  Please allow 2-4 weeks for scheduling of routine cardiac CTs. Some insurance companies  require a pre-authorization which may delay scheduling of this test.   For non-scheduling related questions, please contact the cardiac imaging nurse navigator should you have any questions/concerns: Marchia Bond, Cardiac Imaging Nurse Navigator Gordy Clement, Cardiac Imaging Nurse Navigator [facility] Direct Office Dial: [phone]   For scheduling needs, including cancellations and rescheduling, please call [place], [phone].    2.  You are scheduled for Cardiac MRI on ___________ Please arrive at [place] at ___________. ? -Someone will call you to schedule this   Magnetic resonance imaging (MRI) is a painless test that produces images of the inside of the body without using X-rays. During an MRI, strong magnets and radio waves work together in a Research officer, political party to form detailed images. MRI images may provide more details about a medical condition than X-rays, CT scans, and ultrasounds can provide.  You may be given earphones to listen for instructions.  You may eat a light breakfast and take your regular medications as ordered. If a contrast material will be used, an IV will be inserted into one of your veins. Contrast material will be injected into your IV.  You will be asked to remove all metal, including: Watch, jewelry, and other metal objects including hearing aids, hair pieces and dentures. (Braces and fillings normally are not a problem.)  If contrast material was used:  It will leave your body through your urine within a day. You may be told to drink plenty of fluids to help flush the contrast material out of your system.  TEST WILL TAKE APPROXIMATELY 1 HOUR  PLEASE NOTIFY SCHEDULING AT LEAST 24 HOURS IN ADVANCE IF YOU ARE UNABLE TO KEEP YOUR APPOINTMENT.      3.   Your physician has recommended that you wear a Zio XT monitor for 2 weeks. This will be mailed to your home address in 4-5 business days.   Your clinician has requested a Zio  heart rhythm monitor by [employer] to be mailed to your home for you to wear for 14 days. You should expect a small box to arrive via USPS (or FedEx in some cases) within this next week. If you do not receive it please call [employer] at [phone].  Closely watching your heart at this time will help your care team understand more and provide information needed to develop your plan of care.  Please apply your Zio patch monitor the day you receive it. Keep this packaging, you will use this to return your Zio monitor.  You will easily be able to apply the monitor with the instructions provided in the Patient Guide.  If you need assistance, [employer] representatives are available 24/7 at [phone].  You can also download the [facility] app on your phone to view detailed application instructions and log symptoms.  After you wear your monitor for 14 days, place it back in the blue box or envelope, along with your Symptom Log.  To send your monitor back: Simply use the pre-addressed and pre-paid box/envelope.  Send it back through [employer] the same day you remove it via your local post office or by placing it in your mailbox.  As soon as we receive the results, they will be reviewed and your clinician will contact you.  For the first 24 hours- it is essential to not shower or exercise, to allow the patch to adhere to your skin. Avoid excessive sweating to help maximize wear time. Do not submerge the device, no hot tubs, and no  swimming pools. Keep any lotions or oils away from the patch. After 24 hours you may shower with the patch on. Take brief showers with your back facing the shower head.  Do not remove patch once it has been placed because that will interrupt data and decrease adhesive wear time. Push the button when you have any symptoms and write down what you were feeling. Once you have completed wearing your monitor, remove and place into box which has postage paid and place in your  outgoing mailbox.  If for some reason you have misplaced your box then call our office and we can provide another box and/or mail it off for you.     Follow-Up: At [facility], you and your health needs are our priority.  As part of our continuing mission to provide you with exceptional heart care, we have created designated Provider Care Teams.  These Care Teams include your primary Cardiologist (physician) and Advanced Practice Providers (APPs -  Physician Assistants and Nurse Practitioners) who all work together to provide you with the care you need, when you need it.  We recommend signing up for the patient portal called "MyChart".  Sign up information is provided on this After Visit Summary.  MyChart is used to connect with patients for Virtual Visits (Telemedicine).  Patients are able to view lab/test results, encounter notes, upcoming appointments, etc.  Non-urgent messages can be sent to your provider as well.   To learn more about what you can do with MyChart, go to [email or web address].    Your next appointment:   6 week(s)  The format for your next appointment:   In Person  Provider:   You may see Ida Rogue, MD or one of the following Advanced Practice Providers on your designated Care Team:   Murray Hodgkins, NP    Other Instructions   Important Information About Sugar         Signed, Kate Sable, MD  01/11/2022 5:04 PM    [place]

## 2022-01-11 NOTE — Patient Instructions (Signed)
Medication Instructions:   Your physician has recommended you make the following change in your medication:    INCREASE your Metoprolol Succinate (Toprol XL) to 50 MG once a day.  *If you need a refill on your cardiac medications before your next appointment, please call your pharmacy*   Lab Work:  Please go to the [facility] for an H/H lab draw after your appointment today.   Testing/Procedures:  Your physician has requested that you have cardiac CT. Cardiac computed tomography (CT) is a painless test that uses an x-ray machine to take clear, detailed pictures of your heart.    Your cardiac CT will be scheduled at:  [place] [facility]  Monday 01/14/22 at 11:00  Please arrive 15 mins early for check-in and test prep.    Please follow these instructions carefully (unless otherwise directed):    On the Night Before the Test: Be sure to Drink plenty of water. Do not consume any caffeinated/decaffeinated beverages or chocolate 12 hours prior to your test.   On the Day of the Test: Drink plenty of water until 1 hour prior to the test. Do not eat any food 4 hours prior to the test. You may take your regular medications prior to the test.  Take metoprolol succinate 100 MG two hours prior to test. (This is double your normal dose) FEMALES- please wear underwire-free bra if available, avoid dresses & tight clothing        After the Test: Drink plenty of water. After receiving IV contrast, you may experience a mild flushed feeling. This is normal. On occasion, you may experience a mild rash up to 24 hours after the test. This is not dangerous. If this occurs, you can take Benadryl 25 mg and increase your fluid intake. If you experience trouble breathing, this can be serious. If it is severe call 911 IMMEDIATELY. If it is mild, please call our office. If you take any of these medications: Glipizide/Metformin, Avandament, Glucavance, please do not take 48 hours after  completing test unless otherwise instructed.  Please allow 2-4 weeks for scheduling of routine cardiac CTs. Some insurance companies require a pre-authorization which may delay scheduling of this test.   For non-scheduling related questions, please contact the cardiac imaging nurse navigator should you have any questions/concerns: Marchia Bond, Cardiac Imaging Nurse Navigator Gordy Clement, Cardiac Imaging Nurse Navigator [facility] Direct Office Dial: [phone]   For scheduling needs, including cancellations and rescheduling, please call [place], [phone].    2.  You are scheduled for Cardiac MRI on ___________ Please arrive at [facility] [place] at ___________. ? -Someone will call you to schedule this   Magnetic resonance imaging (MRI) is a painless test that produces images of the inside of the body without using X-rays. During an MRI, strong magnets and radio waves work together in a Research officer, political party to form detailed images. MRI images may provide more details about a medical condition than X-rays, CT scans, and ultrasounds can provide.  You may be given earphones to listen for instructions.  You may eat a light breakfast and take your regular medications as ordered. If a contrast material will be used, an IV will be inserted into one of your veins. Contrast material will be injected into your IV.  You will be asked to remove all metal, including: Watch, jewelry, and other metal objects including hearing aids, hair pieces and dentures. (Braces and fillings normally are not a problem.)  If contrast material was  used:  It will leave your body through your urine within a day. You may be told to drink plenty of fluids to help flush the contrast material out of your system.  TEST WILL TAKE APPROXIMATELY 1 HOUR  PLEASE NOTIFY SCHEDULING AT LEAST 24 HOURS IN ADVANCE IF YOU ARE UNABLE TO KEEP YOUR APPOINTMENT.      3.   Your physician has recommended that you wear  a Zio XT monitor for 2 weeks. This will be mailed to your home address in 4-5 business days.   Your clinician has requested a Zio heart rhythm monitor by [employer] to be mailed to your home for you to wear for 14 days. You should expect a small box to arrive via USPS (or FedEx in some cases) within this next week. If you do not receive it please call [employer] at [phone].  Closely watching your heart at this time will help your care team understand more and provide information needed to develop your plan of care.  Please apply your Zio patch monitor the day you receive it. Keep this packaging, you will use this to return your Zio monitor.  You will easily be able to apply the monitor with the instructions provided in the Patient Guide.  If you need assistance, [employer] representatives are available 24/7 at [phone].  You can also download the [facility] app on your phone to view detailed application instructions and log symptoms.  After you wear your monitor for 14 days, place it back in the blue box or envelope, along with your Symptom Log.  To send your monitor back: Simply use the pre-addressed and pre-paid box/envelope.  Send it back through [employer] the same day you remove it via your local post office or by placing it in your mailbox.  As soon as we receive the results, they will be reviewed and your clinician will contact you.  For the first 24 hours- it is essential to not shower or exercise, to allow the patch to adhere to your skin. Avoid excessive sweating to help maximize wear time. Do not submerge the device, no hot tubs, and no swimming pools. Keep any lotions or oils away from the patch. After 24 hours you may shower with the patch on. Take brief showers with your back facing the shower head.  Do not remove patch once it has been placed because that will interrupt data and decrease adhesive wear time. Push the button when you have any symptoms and write down what you  were feeling. Once you have completed wearing your monitor, remove and place into box which has postage paid and place in your outgoing mailbox.  If for some reason you have misplaced your box then call our office and we can provide another box and/or mail it off for you.     Follow-Up: At [facility], you and your health needs are our priority.  As part of our continuing mission to provide you with exceptional heart care, we have created designated Provider Care Teams.  These Care Teams include your primary Cardiologist (physician) and Advanced Practice Providers (APPs -  Physician Assistants and Nurse Practitioners) who all work together to provide you with the care you need, when you need it.  We recommend signing up for the patient portal called "MyChart".  Sign up information is provided on this After Visit Summary.  MyChart is used to connect with patients for Virtual Visits (Telemedicine).  Patients are able to view lab/test results, encounter notes, upcoming  appointments, etc.  Non-urgent messages can be sent to your provider as well.   To learn more about what you can do with MyChart, go to [email or web address].    Your next appointment:   6 week(s)  The format for your next appointment:   In Person  Provider:   You may see Ida Rogue, MD or one of the following Advanced Practice Providers on your designated Care Team:   Murray Hodgkins, NP    Other Instructions   Important Information About Sugar

## 2022-01-14 ENCOUNTER — Ambulatory Visit
Admission: RE | Admit: 2022-01-14 | Discharge: 2022-01-14 | Disposition: A | Discharge status: Home / Self Care | Payer: Medicare HMO | Source: Ambulatory Visit | Attending: Cardiology | Admitting: Cardiology

## 2022-01-14 DIAGNOSIS — R072 Precordial pain: Secondary | ICD-10-CM | POA: Insufficient documentation

## 2022-01-14 MED ORDER — NITROGLYCERIN 0.4 MG SL SUBL
0.8000 mg | SUBLINGUAL_TABLET | Freq: Once | SUBLINGUAL | Status: AC
Start: 2022-01-14 — End: 2022-01-14
  Administered 2022-01-14: 0.8 mg via SUBLINGUAL
  Filled 2022-01-14: qty 25

## 2022-01-14 MED ORDER — IOHEXOL 350 MG/ML SOLN
100.0000 mL | Freq: Once | INTRAVENOUS | Status: AC | PRN
  Administered 2022-01-14: 100 mL via INTRAVENOUS

## 2022-01-14 MED ORDER — NITROGLYCERIN 0.4 MG SL SUBL
SUBLINGUAL_TABLET | SUBLINGUAL | Status: AC
  Filled 2022-01-14: qty 2

## 2022-01-14 NOTE — Progress Notes (Signed)
Patient tolerated procedure well. Ambulate w/o difficulty. Denies any lightheadedness or being dizzy. Pt denies any pain at this time. Sitting in chair, drinking water provided. P is encouraged to drink additional water throughout the day and reason explained to patient. Patient verbalized understanding and all questions answered. ABC intact. No further needs at this time. Discharge from procedure area w/o issues.  °

## 2022-01-15 DIAGNOSIS — I421 Obstructive hypertrophic cardiomyopathy: Secondary | ICD-10-CM | POA: Diagnosis not present

## 2022-01-29 ENCOUNTER — Telehealth (HOSPITAL_COMMUNITY): Payer: Self-pay | Admitting: *Deleted

## 2022-01-29 NOTE — Telephone Encounter (Signed)
Attempted to call patient regarding upcoming cardiac MRI appointment. Left message on voicemail with name and callback number  Winston Sobczyk RN Navigator Cardiac Imaging  Heart and Vascular Services [phone] Office [phone] Cell  

## 2022-01-30 ENCOUNTER — Ambulatory Visit
Admission: RE | Admit: 2022-01-30 | Discharge: 2022-01-30 | Disposition: A | Discharge status: Home / Self Care | Payer: Medicare HMO | Source: Ambulatory Visit | Attending: Cardiology | Admitting: Cardiology

## 2022-01-30 ENCOUNTER — Other Ambulatory Visit: Payer: Self-pay | Admitting: Cardiology

## 2022-01-30 DIAGNOSIS — R072 Precordial pain: Secondary | ICD-10-CM | POA: Diagnosis not present

## 2022-01-30 DIAGNOSIS — I421 Obstructive hypertrophic cardiomyopathy: Secondary | ICD-10-CM | POA: Diagnosis not present

## 2022-01-30 MED ORDER — GADOBUTROL 1 MMOL/ML IV SOLN
10.0000 mL | Freq: Once | INTRAVENOUS | Status: AC | PRN
  Administered 2022-01-30: 10 mL via INTRAVENOUS

## 2022-02-01 ENCOUNTER — Ambulatory Visit (INDEPENDENT_AMBULATORY_CARE_PROVIDER_SITE_OTHER): Payer: Medicare HMO

## 2022-02-01 VITALS — Ht 65.0 in | Wt 172.0 lb

## 2022-02-01 DIAGNOSIS — Z0000 Encounter for general adult medical examination without abnormal findings: Secondary | ICD-10-CM | POA: Diagnosis not present

## 2022-02-01 NOTE — Patient Instructions (Addendum)
  Mary Walter , Thank you for taking time to come for your Medicare Wellness Visit. I appreciate your ongoing commitment to your health goals. Please review the following plan we discussed and let me know if I can assist you in the future.   These are the goals we discussed:  Goals      Maintain healthy lifestyle     Stay active Healthy diet        This is a list of the screening recommended for you and due dates:  Health Maintenance  Topic Date Due   Flu Shot  01/22/2022   COVID-19 Vaccine (4 - Pfizer risk series) 02/17/2022*   Tetanus Vaccine  03/24/2022*   Zoster (Shingles) Vaccine (1 of 2) 05/04/2022*   Mammogram  08/14/2022   Colon Cancer Screening  12/18/2026   Pneumonia Vaccine  Completed   DEXA scan (bone density measurement)  Completed   Hepatitis C Screening: USPSTF Recommendation to screen - Ages 4-79 yo.  Completed   HPV Vaccine  Aged Out  *Topic was postponed. The date shown is not the original due date.

## 2022-02-01 NOTE — Progress Notes (Addendum)
Subjective:   Mary Walter is a 66 y.o. female who presents for an Initial Medicare Annual Wellness Visit.  Review of Systems    No ROS.  Medicare Wellness Virtual Visit.  Visual/audio telehealth visit, UTA vital signs.   See social history for additional risk factors.   Cardiac Risk Factors include: advanced age (>42mn, >>62women);hypertension     Objective:    Today's Vitals   02/01/22 1158  Weight: 172 lb (78 kg)  Height: '5\' 5"'$  (1.651 m)   Body mass index is 28.62 kg/m.     02/01/2022   11:22 AM 05/03/2021   10:52 AM 11/08/2020   12:21 PM 11/07/2020   11:21 AM 10/25/2020    2:02 PM 02/14/2020    9:38 AM 11/19/2019    4:19 PM  Advanced Directives  Does Patient Have a Medical Advance Directive? No No No No No No No  Would patient like information on creating a medical advance directive? No - Patient declined No - Patient declined No - Patient declined No - Patient declined  No - Patient declined     Current Medications (verified) Outpatient Encounter Medications as of 02/01/2022  Medication Sig   aspirin EC 81 MG EC tablet Take 1 tablet (81 mg total) by mouth daily.   atorvastatin (LIPITOR) 40 MG tablet Take 1 tablet (40 mg total) by mouth daily.   Calcium Carbonate-Vitamin D3 600-400 MG-UNIT TABS Take by mouth.   Magnesium 250 MG TABS Take by mouth.   metoprolol succinate (TOPROL XL) 50 MG 24 hr tablet Take 1 tablet (50 mg total) by mouth daily.   Multiple Vitamin (MULTIVITAMIN) tablet Take 1 tablet by mouth daily.   sertraline (ZOLOFT) 50 MG tablet Take 1 tablet by mouth once daily   tamoxifen (NOLVADEX) 20 MG tablet Take 20 mg by mouth daily.   tobramycin-dexamethasone (TOBRADEX) ophthalmic ointment Place 1 Application into the left eye 2 (two) times daily.   Vitamin D, Cholecalciferol, 10 MCG (400 UNIT) CAPS Take by mouth.   No facility-administered encounter medications on file as of 02/01/2022.    Allergies (verified) Macrobid [nitrofurantoin  macrocrystal] and Penicillins   History: Past Medical History:  Diagnosis Date   Breast cancer ([place]    Chest pain    a. 10/2015 MV: abnl ECG w/ inf/antlat ST depression w/ exercise (in setting of LVH @ baseline). No ischemia/infarct.   Dyspnea    WITH EXERTION DUE TO ENLARGED HEART   Family history of adverse reaction to anesthesia    SON-LUNGS FILLED UP WITH FLUID AFTER APPENDECTOMY   Hematuria    Hypercholesterolemia    Hypertension    Hypertrophic cardiomegaly    a. 10/2019 Echo: EF 70-75%, mod LVH. No rwma. Mild systolic anterior motion of the mitral valve with mild LVOT obstruction (peak gradient 20 mmHg at rest and 26 mmHg with Valsalva).  Normal RV systolic function.  Normal PASP.  Mildly dilated LA.   Personal history of radiation therapy    Systolic murmur    Past Surgical History:  Procedure Laterality Date   BREAST BIOPSY Left 10/27/2019   Affirm bx-distortion-     clip-path ADH   BREAST BIOPSY Left 01/07/2020   Affim bx 1 area/ x clip and ribbon clip/ ADH   BREAST LUMPECTOMY     BREAST LUMPECTOMY WITH NEEDLE LOCALIZATION Left 02/14/2020   Procedure: BREAST LUMPECTOMY WITH NEEDLE LOCALIZATION;  Surgeon: BRobert Bellow MD;  Location: [facility] ORS;  Service: General;  Laterality: Left;  CHOLECYSTECTOMY  10/2020   FACIAL FRACTURE SURGERY     X 2   Family History  Problem Relation Age of Onset   Diabetes Mother    Heart disease Mother    Hypertension Mother    Cancer Father        prostate    Diverticulitis Father    CVA Father    Heart disease Father    Stroke Father    Hypertension Father    Heart murmur Father    Breast cancer Sister    Heart attack Brother    CVA Paternal Grandmother    Breast cancer Cousin    Social History   Socioeconomic History   Marital status: Married    Spouse name: Not on file   Number of children: 2   Years of education: Not on file   Highest education level: Not on file  Occupational History   Not on file  Tobacco Use    Smoking status: Never   Smokeless tobacco: Never  Vaping Use   Vaping Use: Never used  Substance and Sexual Activity   Alcohol use: No    Alcohol/week: 0.0 standard drinks of alcohol   Drug use: No   Sexual activity: Not Currently  Other Topics Concern   Not on file  Social History Narrative   Not on file   Social Determinants of Health   Financial Resource Strain: Low Risk  (02/01/2022)   Overall Financial Resource Strain (CARDIA)    Difficulty of Paying Living Expenses: Not hard at all  Food Insecurity: No Food Insecurity (02/01/2022)   Hunger Vital Sign    Worried About Running Out of Food in the Last Year: Never true    Ran Out of Food in the Last Year: Never true  Transportation Needs: No Transportation Needs (02/01/2022)   PRAPARE - Hydrologist (Medical): No    Lack of Transportation (Non-Medical): No  Physical Activity: Not on file  Stress: No Stress Concern Present (02/01/2022)   Mary Walter    Feeling of Stress : Not at all  Social Connections: Not on file    Tobacco Counseling Counseling given: Not Answered   Clinical Intake:                         Activities of Daily Living    02/01/2022   11:26 AM  In your present state of health, do you have any difficulty performing the following activities:  Hearing? 0  Vision? 0  Difficulty concentrating or making decisions? 0  Walking or climbing stairs? 0  Dressing or bathing? 0  Doing errands, shopping? 0  Preparing Food and eating ? N  Using the Toilet? N  In the past six months, have you accidently leaked urine? N  Do you have problems with loss of bowel control? N  Comment Diarrhea over the last 3 weeks.Follow up appointment offered, declined. Agrees to keep scheduled appointment 02/22/22.Denies pain, bleeding, weakness.  Managing your Medications? N  Managing your Finances? N  Housekeeping or  managing your Housekeeping? N    Patient Care Team: Einar Pheasant, MD as PCP - General (Internal Medicine) Minna Merritts, MD as PCP - Cardiology (Cardiology) Noreene Filbert, MD as Radiation Oncologist (Radiation Oncology) Bary Castilla Forest Gleason, MD as Consulting Physician (General Surgery)  Indicate any recent Medical Services you may have received from other than [facility] providers in the past  year (date may be approximate).     Assessment:   This is a routine wellness examination for Mary Walter.  Virtual Visit via Telephone Note  I connected with  Mary Walter on 02/01/22 at 11:15 AM EDT by telephone and verified that I am speaking with the correct person using two identifiers.  Location: Patient: home  Provider: office Persons participating in the virtual visit: patient/Nurse Health Advisor   I discussed the limitations of performing an evaluation and management service by telehealth. We continued and completed visit with audio only. Some vital signs may be absent or patient reported.   Hearing/Vision screen Hearing Screening - Comments:: Difficulty hearing L ear. Audiology deferred per patient in the last year. Agrees to follow up with PCP as needed.  Vision Screening - Comments:: Wears readers only Plans to follow up with ophthalmologist for annual exam.  Dietary issues and exercise activities discussed: Current Exercise Habits: Home exercise routine, Type of exercise: walking, Intensity: Mild Healthy diet Good water intake   Goals Addressed             This Visit's Progress    Maintain healthy lifestyle       Stay active Healthy diet       Depression Screen    02/01/2022   11:21 AM 01/08/2022    9:29 AM 09/24/2021   10:27 AM 05/24/2021    9:25 AM 01/11/2021    9:05 AM 06/29/2020    3:43 PM 04/20/2019    9:39 AM  PHQ 2/9 Scores  PHQ - 2 Score 0 0 0 0 0 0 0    Fall Risk    02/01/2022   11:24 AM 01/08/2022    9:29 AM 09/24/2021   10:27 AM 05/24/2021    9:25  AM 01/11/2021    9:05 AM  Fall Risk   Falls in the past year? 0 0 0 0 0  Number falls in past yr: 0   0 0  Injury with Fall?    0 0  Risk for fall due to :  No Fall Risks No Fall Risks No Fall Risks   Follow up Falls evaluation completed Falls evaluation completed Falls evaluation completed Falls evaluation completed Falls evaluation completed    [place]: Home free of loose throw rugs in walkways, pet beds, electrical cords, etc? Yes  Adequate lighting in your home to reduce risk of falls? Yes   ASSISTIVE DEVICES UTILIZED TO PREVENT FALLS: Life alert? No  Use of a cane, walker or w/c? No   TIMED UP AND GO: Was the test performed? No .   Cognitive Function:  Patient is alert and oriented x3.  Denies difficulty focusing, memory loss.  Active with weekly employment.       Immunizations Immunization History  Administered Date(s) Administered   Influenza Split 05/31/2013   Influenza, High Dose Seasonal PF 04/20/2021   Influenza, Quadrivalent, Recombinant, Inj, Pf 04/02/2019   Influenza,inj,Quad PF,6+ Mos 03/08/2014   Influenza,inj,quad, With Preservative 04/22/2017   Influenza-Unspecified 02/24/2016, 04/03/2018, 06/12/2020   PFIZER(Purple Top)SARS-COV-2 Vaccination 09/14/2019, 10/05/2019, 08/11/2020   PNEUMOCOCCAL CONJUGATE-20 01/11/2021   Zoster, Live 02/24/2016    TDAP status: Due, Education has been provided regarding the importance of this vaccine. Advised may receive this vaccine at local pharmacy or Health Dept. Aware to provide a copy of the vaccination record if obtained from local pharmacy or Health Dept. Verbalized acceptance and understanding.  Shingrix Completed?: No.    Education has been  provided regarding the importance of this vaccine. Patient has been advised to call insurance company to determine out of pocket expense if they have not yet received this vaccine. Advised may also receive vaccine at local pharmacy or Health  Dept. Verbalized acceptance and understanding.  Screening Tests Health Maintenance  Topic Date Due   INFLUENZA VACCINE  01/22/2022   COVID-19 Vaccine (4 - Pfizer risk series) 02/17/2022 (Originally 10/06/2020)   TETANUS/TDAP  03/24/2022 (Originally 11/20/1973)   Zoster Vaccines- Shingrix (1 of 2) 05/04/2022 (Originally 11/20/1973)   MAMMOGRAM  08/14/2022   COLONOSCOPY (Pts 45-16yr Insurance coverage will need to be confirmed)  12/18/2026   Pneumonia Vaccine 66 Years old  Completed   DEXA SCAN  Completed   Hepatitis C Screening  Completed   HPV VACCINES  Aged Out   Health Maintenance Health Maintenance Due  Topic Date Due   INFLUENZA VACCINE  01/22/2022   Lung Cancer Screening: (Low Dose CT Chest recommended if Age 67-80years, 30 pack-year currently smoking OR have quit w/in 15years.) does not qualify.   Vision Screening: Recommended annual ophthalmology exams for early detection of glaucoma and other disorders of the eye.  Dental Screening: Recommended annual dental exams for proper oral hygiene  Community Resource Referral / Chronic Care Management: CRR required this visit?  No   CCM required this visit?  No      Plan:     I have personally reviewed and noted the following in the patient's chart:   Medical and social history Use of alcohol, tobacco or illicit drugs  Current medications and supplements including opioid prescriptions. Patient is not currently taking opioid prescriptions. Functional ability and status Nutritional status Physical activity Advanced directives List of other physicians Hospitalizations, surgeries, and ER visits in previous 12 months Vitals Screenings to include cognitive, depression, and falls Referrals and appointments  In addition, I have reviewed and discussed with patient certain preventive protocols, quality metrics, and best practice recommendations. A written personalized care plan for preventive services as well as general  preventive health recommendations were provided to patient.     OVarney Biles LPN   [id number]

## 2022-02-05 DIAGNOSIS — I421 Obstructive hypertrophic cardiomyopathy: Secondary | ICD-10-CM | POA: Diagnosis not present

## 2022-02-08 ENCOUNTER — Telehealth: Payer: Self-pay | Admitting: *Deleted

## 2022-02-08 NOTE — Telephone Encounter (Signed)
Left voicemail message to call back for review of results.  

## 2022-02-08 NOTE — Telephone Encounter (Signed)
Patient returning call.

## 2022-02-08 NOTE — Telephone Encounter (Signed)
-----   Message from Kate Sable, MD sent at 02/07/2022  5:21 PM EDT ----- Paroxysmal SVTs, 3 short runs of VT lasting 4 beats noted. Overall no significant/persistent arrhythmias. Patient triggered events associated with rare PACs. Keep follow-up appointment with Dr. Rockey Situ or APP

## 2022-02-11 NOTE — Telephone Encounter (Signed)
The patient has been notified of the result and verbalized understanding.  All questions (if any) were answered. Darrell Jewel, RN 02/11/2022 8:46 AM    Confirmed appt date and time with patient.

## 2022-02-22 ENCOUNTER — Ambulatory Visit (INDEPENDENT_AMBULATORY_CARE_PROVIDER_SITE_OTHER): Payer: Medicare HMO | Admitting: Internal Medicine

## 2022-02-22 ENCOUNTER — Encounter: Payer: Self-pay | Admitting: Internal Medicine

## 2022-02-22 DIAGNOSIS — I25118 Atherosclerotic heart disease of native coronary artery with other forms of angina pectoris: Secondary | ICD-10-CM | POA: Diagnosis not present

## 2022-02-22 DIAGNOSIS — D696 Thrombocytopenia, unspecified: Secondary | ICD-10-CM | POA: Diagnosis not present

## 2022-02-22 DIAGNOSIS — E782 Mixed hyperlipidemia: Secondary | ICD-10-CM

## 2022-02-22 DIAGNOSIS — I10 Essential (primary) hypertension: Secondary | ICD-10-CM

## 2022-02-22 DIAGNOSIS — I421 Obstructive hypertrophic cardiomyopathy: Secondary | ICD-10-CM | POA: Diagnosis not present

## 2022-02-22 DIAGNOSIS — R197 Diarrhea, unspecified: Secondary | ICD-10-CM

## 2022-02-22 LAB — COMPREHENSIVE METABOLIC PANEL
ALT: 42 U/L — ABNORMAL HIGH (ref 0–35)
AST: 31 U/L (ref 0–37)
Albumin: 3.7 g/dL (ref 3.5–5.2)
Alkaline Phosphatase: 62 U/L (ref 39–117)
BUN: 16 mg/dL (ref 6–23)
CO2: 26 mEq/L (ref 19–32)
Calcium: 10.5 mg/dL (ref 8.4–10.5)
Chloride: 109 mEq/L (ref 96–112)
Creatinine, Ser: 0.88 mg/dL (ref 0.40–1.20)
GFR: 68.08 mL/min (ref >60.00)
Glucose, Bld: 80 mg/dL (ref 70–99)
Potassium: 4.1 mEq/L (ref 3.5–5.1)
Sodium: 142 mEq/L (ref 135–145)
Total Bilirubin: 0.9 mg/dL (ref 0.2–1.2)
Total Protein: 6.1 g/dL (ref 6.0–8.3)

## 2022-02-22 LAB — CBC WITH DIFFERENTIAL/PLATELET
Basophils Absolute: 0.1 10*3/uL (ref 0.0–0.1)
Basophils Relative: 0.8 % (ref 0.0–3.0)
Eosinophils Absolute: 0.1 10*3/uL (ref 0.0–0.7)
Eosinophils Relative: 0.9 % (ref 0.0–5.0)
HCT: 41.8 % (ref 36.0–46.0)
Hemoglobin: 14.2 g/dL (ref 12.0–15.0)
Lymphocytes Relative: 28.3 % (ref 12.0–46.0)
Lymphs Abs: 2.4 10*3/uL (ref 0.7–4.0)
MCHC: 33.9 g/dL (ref 30.0–36.0)
MCV: 92.3 fl (ref 78.0–100.0)
Monocytes Absolute: 0.6 10*3/uL (ref 0.1–1.0)
Monocytes Relative: 7.2 % (ref 3.0–12.0)
Neutro Abs: 5.2 10*3/uL (ref 1.4–7.7)
Neutrophils Relative %: 62.8 % (ref 43.0–77.0)
Platelets: 136 10*3/uL — ABNORMAL LOW (ref 150.0–400.0)
RBC: 4.53 Mil/uL (ref 3.87–5.11)
RDW: 13.4 % (ref 11.5–15.5)
WBC: 8.3 10*3/uL (ref 4.0–10.5)

## 2022-02-22 NOTE — Patient Instructions (Signed)
Benefiber - daily   Examples of probiotics:  culturelle, florastor or align

## 2022-02-22 NOTE — Progress Notes (Unsigned)
Patient ID: CARALYN TWINING, female   DOB: [birth year], 66 y.o.   MRN: [id number]   Subjective:    Patient ID: ALISABETH SELKIRK, female    DOB: 03-28-1956, 66 y.o.   MRN: [id number]  This visit occurred during the SARS-CoV-2 public health emergency.  Safety protocols were in place, including screening questions prior to the visit, additional usage of staff PPE, and extensive cleaning of exam room while observing appropriate contact time as indicated for disinfecting solutions.   Patient here for  Chief Complaint  Patient presents with   Follow-up    6-8 week follow up & diarrhea for 30 days- 1-6 bouts a day   .   HPI    Past Medical History:  Diagnosis Date   Breast cancer ([place])    Chest pain    a. 10/2015 MV: abnl ECG w/ inf/antlat ST depression w/ exercise (in setting of LVH @ baseline). No ischemia/infarct.   Dyspnea    WITH EXERTION DUE TO ENLARGED HEART   Family history of adverse reaction to anesthesia    SON-LUNGS FILLED UP WITH FLUID AFTER APPENDECTOMY   Hematuria    Hypercholesterolemia    Hypertension    Hypertrophic cardiomegaly    a. 10/2019 Echo: EF 70-75%, mod LVH. No rwma. Mild systolic anterior motion of the mitral valve with mild LVOT obstruction (peak gradient 20 mmHg at rest and 26 mmHg with Valsalva).  Normal RV systolic function.  Normal PASP.  Mildly dilated LA.   Personal history of radiation therapy    Systolic murmur    Past Surgical History:  Procedure Laterality Date   BREAST BIOPSY Left 10/27/2019   Affirm bx-distortion-     clip-path ADH   BREAST BIOPSY Left 01/07/2020   Affim bx 1 area/ x clip and ribbon clip/ ADH   BREAST LUMPECTOMY     BREAST LUMPECTOMY WITH NEEDLE LOCALIZATION Left 02/14/2020   Procedure: BREAST LUMPECTOMY WITH NEEDLE LOCALIZATION;  Surgeon: Robert Bellow, MD;  Location: [facility] ORS;  Service: General;  Laterality: Left;   CHOLECYSTECTOMY  10/2020   FACIAL FRACTURE SURGERY     X 2   Family History  Problem Relation  Age of Onset   Diabetes Mother    Heart disease Mother    Hypertension Mother    Cancer Father        prostate    Diverticulitis Father    CVA Father    Heart disease Father    Stroke Father    Hypertension Father    Heart murmur Father    Breast cancer Sister    Heart attack Brother    CVA Paternal Grandmother    Breast cancer Cousin    Social History   Socioeconomic History   Marital status: Married    Spouse name: Not on file   Number of children: 2   Years of education: Not on file   Highest education level: Not on file  Occupational History   Not on file  Tobacco Use   Smoking status: Never   Smokeless tobacco: Never  Vaping Use   Vaping Use: Never used  Substance and Sexual Activity   Alcohol use: No    Alcohol/week: 0.0 standard drinks of alcohol   Drug use: No   Sexual activity: Not Currently  Other Topics Concern   Not on file  Social History Narrative   Not on file   Social Determinants of Health   Financial Resource Strain: Low Risk  (02/01/2022)  Overall Financial Resource Strain (CARDIA)    Difficulty of Paying Living Expenses: Not hard at all  Food Insecurity: No Food Insecurity (02/01/2022)   Hunger Vital Sign    Worried About Running Out of Food in the Last Year: Never true    Ran Out of Food in the Last Year: Never true  Transportation Needs: No Transportation Needs (02/01/2022)   PRAPARE - Hydrologist (Medical): No    Lack of Transportation (Non-Medical): No  Physical Activity: Not on file  Stress: No Stress Concern Present (02/01/2022)   [place]    Feeling of Stress : Not at all  Social Connections: Not on file     Review of Systems     Objective:     BP 132/74 (BP Location: Left Arm, Patient Position: Sitting, Cuff Size: Normal)   Pulse 62   Temp 98.3 F (36.8 C) (Oral)   Ht '5\' 5"'$  (1.651 m)   Wt 169 lb (76.7 kg)   LMP 06/24/2010    SpO2 98%   BMI 28.12 kg/m  Wt Readings from Last 3 Encounters:  02/22/22 169 lb (76.7 kg)  02/01/22 172 lb (78 kg)  01/11/22 172 lb 2 oz (78.1 kg)    Physical Exam   Outpatient Encounter Medications as of 02/22/2022  Medication Sig   aspirin EC 81 MG EC tablet Take 1 tablet (81 mg total) by mouth daily.   atorvastatin (LIPITOR) 40 MG tablet Take 1 tablet (40 mg total) by mouth daily.   Calcium Carbonate-Vitamin D3 600-400 MG-UNIT TABS Take by mouth.   Magnesium 250 MG TABS Take by mouth.   metoprolol succinate (TOPROL XL) 50 MG 24 hr tablet Take 1 tablet (50 mg total) by mouth daily.   Multiple Vitamin (MULTIVITAMIN) tablet Take 1 tablet by mouth daily.   sertraline (ZOLOFT) 50 MG tablet Take 1 tablet by mouth once daily   tamoxifen (NOLVADEX) 20 MG tablet Take 20 mg by mouth daily.   tobramycin-dexamethasone (TOBRADEX) ophthalmic ointment Place 1 Application into the left eye 2 (two) times daily.   Vitamin D, Cholecalciferol, 10 MCG (400 UNIT) CAPS Take by mouth.   No facility-administered encounter medications on file as of 02/22/2022.     Lab Results  Component Value Date   WBC 5.6 01/02/2022   HGB 13.5 01/11/2022   HCT 40.2 01/11/2022   PLT 151 01/08/2022   GLUCOSE 88 01/02/2022   CHOL 108 01/02/2022   TRIG 70.0 01/02/2022   HDL 39.90 01/02/2022   LDLDIRECT 57 01/19/2020   LDLCALC 55 01/02/2022   ALT 33 01/02/2022   AST 27 01/02/2022   NA 141 01/02/2022   K 3.9 01/02/2022   CL 112 01/02/2022   CREATININE 0.87 01/02/2022   BUN 18 01/02/2022   CO2 25 01/02/2022   TSH 1.32 09/24/2021   INR 1.0 11/19/2019   HGBA1C 5.2 03/30/2013    MR CARDIAC VELOCITY FLOW MAP  Result Date: 01/30/2022 CLINICAL DATA:  Hx of moderate LVH, evaluate for fibrosis, evaluate for HCM EXAM: CARDIAC MRI TECHNIQUE: The patient was scanned on a 1.5 Tesla Siemens magnet. A dedicated cardiac coil was used. Functional imaging was done using Fiesta sequences. 2,3, and 4 chamber views were done to  assess for RWMA's. Modified Simpson's rule using a short axis stack was used to calculate an ejection fraction on a dedicated work Conservation officer, nature. The patient received 10 cc of Gadavist. After 10  minutes inversion recovery sequences were used to assess for infiltration and scar tissue. Phase contrast velocity mapping performed. CONTRAST:  10 cc  of Gadavist FINDINGS: 1.  Normal left ventricular size and systolic function (LVEF = 73%). There is severe asymmetric basal septal hypertrophy, basal septal segment measuring 1.6 cm. There is systolic anterior motion (SAM) of mitral valve causing LVOT obstruction and mild Mitral regurgitation. There are no regional wall motion abnormalities. There is transmural late gadolinium enhancement (scar) in the left ventricular myocardium-basal segment at the point of RV insertion. LVEDV: 184 ml LVESV: 49 ml SV: 135 ml CO: 8.8 L/min Myocardial mass: 197 g 2. Normal right ventricular size, thickness and systolic function (RVEF = 54%). There are no regional wall motion abnormalities. 3.  Normal left and right atrial size. 4. Normal size of the aortic root, ascending aorta and pulmonary artery. Systemic flow: Total volume 91.25 ml, RF 2.25%, RV 2.156m. Pulmonary flow: Total volume 89.3 ml, RF 1.76%, RV 1.673mQp/Qs: 0.98 5. Mild mitral regurgitation 6.  Normal pericardium.  No pericardial effusion. IMPRESSION: 1. Normal LV and RV function, LVEF 73% 2. Severe asymmetric LV basal septal hypertrophy, basal septal segment measuring 1.6 cm. 3. Transmural LGE/scar present in the LV basal segment (point of maximal thickness and RV insertion) 4. There is systolic anterior motion (SAM) of mitral valve causing LVOT obstruction and mild Mitral regurgitation. 5. Findings consistent with Hypertrophic obstructive Cardiomyopathy (HOCM). Electronically Signed   By: BrKate Sable.D.   On: 01/30/2022 09:48   MR CARDIAC MORPHOLOGY W WO CONTRAST  Result Date: 01/30/2022 CLINICAL  DATA:  Hx of moderate LVH, evaluate for fibrosis, evaluate for HCM EXAM: CARDIAC MRI TECHNIQUE: The patient was scanned on a 1.5 Tesla Siemens magnet. A dedicated cardiac coil was used. Functional imaging was done using Fiesta sequences. 2,3, and 4 chamber views were done to assess for RWMA's. Modified Simpson's rule using a short axis stack was used to calculate an ejection fraction on a dedicated work stConservation officer, natureThe patient received 10 cc of Gadavist. After 10 minutes inversion recovery sequences were used to assess for infiltration and scar tissue. Phase contrast velocity mapping performed. CONTRAST:  10 cc  of Gadavist FINDINGS: 1.  Normal left ventricular size and systolic function (LVEF = 73%). There is severe asymmetric basal septal hypertrophy, basal septal segment measuring 1.6 cm. There is systolic anterior motion (SAM) of mitral valve causing LVOT obstruction and mild Mitral regurgitation. There are no regional wall motion abnormalities. There is transmural late gadolinium enhancement (scar) in the left ventricular myocardium-basal segment at the point of RV insertion. LVEDV: 184 ml LVESV: 49 ml SV: 135 ml CO: 8.8 L/min Myocardial mass: 197 g 2. Normal right ventricular size, thickness and systolic function (RVEF = 54%). There are no regional wall motion abnormalities. 3.  Normal left and right atrial size. 4. Normal size of the aortic root, ascending aorta and pulmonary artery. Systemic flow: Total volume 91.25 ml, RF 2.25%, RV 2.56m54mPulmonary flow: Total volume 89.3 ml, RF 1.76%, RV 1.6ml65m/Qs: 0.98 5.  Mild mitral regurgitation 6.  Normal pericardium.  No pericardial effusion. IMPRESSION: 1. Normal LV and RV function, LVEF 73% 2. Severe asymmetric LV basal septal hypertrophy, basal septal segment measuring 1.6 cm. 3. Transmural LGE/scar present in the LV basal segment (point of maximal thickness and RV insertion) 4. There is systolic anterior motion (SAM) of mitral valve causing  LVOT obstruction and mild Mitral regurgitation. 5. Findings consistent with Hypertrophic obstructive  Cardiomyopathy (HOCM). Electronically Signed   By: Kate Sable M.D.   On: 01/30/2022 09:48       Assessment & Plan:   Problem List Items Addressed This Visit     Diarrhea   Relevant Orders   GI pathogen panel by PCR, stool     Einar Pheasant, MD

## 2022-02-23 ENCOUNTER — Encounter: Payer: Self-pay | Admitting: Internal Medicine

## 2022-02-23 ENCOUNTER — Other Ambulatory Visit: Payer: Self-pay | Admitting: Internal Medicine

## 2022-02-23 DIAGNOSIS — R7989 Other specified abnormal findings of blood chemistry: Secondary | ICD-10-CM

## 2022-02-23 NOTE — Assessment & Plan Note (Signed)
Persistent.  No increased abdominal pain.  Heme negative.  Denies recent abx use.  benefiber and probiotics as directed.  GI pathogen panel.  Follow

## 2022-02-23 NOTE — Assessment & Plan Note (Signed)
Recently evaluated by cardiology for chest pain with minimal exertion.  Recommended f/u CTA and cardiac monitor.  Monitor - Paroxysmal SVTs, 3 short runs of VT lasting 4 beats noted. Overall no significant/persistent arrhythmias. CTA - Stable 4 mm pulmonary nodule in the right lower lobe. This is stable since June 2021 and is likely benign. No follow-up imaging recommended. Coronary calcium score of 216.  Calcified plaque - proximal LAD mild stenosis - 35-49%.  Minimal stenosis - ostial RCA - mild non obstructive CAD.  Recommended continuing aspirin, lipitor and toprol.  MRCM - findings c/w hypertrophic obstructive cardiomyopathy.  Has felt better with increase in toprol.  Continues f/u with cardiology.

## 2022-02-23 NOTE — Assessment & Plan Note (Signed)
Low cholesterol diet and exercise.  Continue lipitor.  Follow lipid panel and liver function tests.   Lab Results  Component Value Date   CHOL 108 01/02/2022   HDL 39.90 01/02/2022   LDLCALC 55 01/02/2022   LDLDIRECT 57 01/19/2020   TRIG 70.0 01/02/2022   CHOLHDL 3 01/02/2022

## 2022-02-23 NOTE — Assessment & Plan Note (Signed)
Follow cbc.  

## 2022-02-23 NOTE — Assessment & Plan Note (Addendum)
MRCM - findings c/w hypertrophic obstructive cardiomyopathy.  Has felt better with increase in toprol. Has f/u with cardiology next week.

## 2022-02-23 NOTE — Progress Notes (Signed)
Order placed for f/u liver panel.  

## 2022-02-23 NOTE — Assessment & Plan Note (Signed)
Continue metoprolol.  Blood pressure as outlined.  No changes in medication.  Follow pressures.  Follow metabolic panel.  

## 2022-02-24 ENCOUNTER — Encounter: Payer: Self-pay | Admitting: *Deleted

## 2022-02-26 ENCOUNTER — Telehealth: Payer: Self-pay

## 2022-02-26 NOTE — Telephone Encounter (Signed)
I called and gave lab results and patient wanted the provider to know her diarrhea as stopped, she is good. Tab Rylee,cma

## 2022-02-27 ENCOUNTER — Ambulatory Visit: Payer: Medicare HMO | Attending: Nurse Practitioner | Admitting: Nurse Practitioner

## 2022-02-27 ENCOUNTER — Encounter: Payer: Self-pay | Admitting: Internal Medicine

## 2022-02-27 ENCOUNTER — Encounter: Payer: Self-pay | Admitting: Nurse Practitioner

## 2022-02-27 VITALS — BP 122/60 | HR 70 | Ht 65.0 in | Wt 172.0 lb

## 2022-02-27 DIAGNOSIS — I421 Obstructive hypertrophic cardiomyopathy: Secondary | ICD-10-CM

## 2022-02-27 DIAGNOSIS — I10 Essential (primary) hypertension: Secondary | ICD-10-CM | POA: Diagnosis not present

## 2022-02-27 DIAGNOSIS — R002 Palpitations: Secondary | ICD-10-CM

## 2022-02-27 MED ORDER — METOPROLOL SUCCINATE ER 50 MG PO TB24: ORAL_TABLET | ORAL | 6 refills | Status: DC

## 2022-02-27 NOTE — Patient Instructions (Signed)
Medication Instructions:  - Your physician has recommended you make the following change in your medication:   1) INCREASE Metoprolol Succinate (Toprol XL) 50 mg: - take 1.5 tablets (75 mg) by mouth once daily   *If you need a refill on your cardiac medications before your next appointment, please call your pharmacy*   Lab Work: - none ordered  If you have labs (blood work) drawn today and your tests are completely normal, you will receive your results only by: [place] (if you have MyChart) OR A paper copy in the mail If you have any lab test that is abnormal or we need to change your treatment, we will call you to review the results.   Testing/Procedures: - You have been referred to: Dr. Tyrell Antonio (for evaluation of Hypertrophic Obstructive Cardiomyopathy)  [address]       [place], [place] [address]        Phone: [phone]  You should receive a call within the next 2 weeks to schedule your appointment. However, if you have not heard anything, please call our office at [phone] to follow up on this for you.    Follow-Up: At [facility], you and your health needs are our priority.  As part of our continuing mission to provide you with exceptional heart care, we have created designated Provider Care Teams.  These Care Teams include your primary Cardiologist (physician) and Advanced Practice Providers (APPs -  Physician Assistants and Nurse Practitioners) who all work together to provide you with the care you need, when you need it.  We recommend signing up for the patient portal called "MyChart".  Sign up information is provided on this After Visit Summary.  MyChart is used to connect with patients for Virtual Visits (Telemedicine).  Patients are able to view lab/test results, encounter notes, upcoming appointments, etc.  Non-urgent messages can be sent to your provider as well.   To learn more about what you can do with MyChart, go  to [email or web address].    Your next appointment:   Pending your appointment with Dr. Gasper Sells    The format for your next appointment:   In Person  Provider:   You may see Ida Rogue, MD or one of the following Advanced Practice Providers on your designated Care Team:   Murray Hodgkins, NP   Other Instructions N/a  Important Information About Sugar

## 2022-02-27 NOTE — Progress Notes (Signed)
Office Visit    Patient Name: Mary Walter Date of Encounter: 02/27/2022  Primary Care Provider:  Einar Pheasant, MD Primary Cardiologist:  Ida Rogue, MD  Chief Complaint    66 year old female with a history of nonobstructive CAD, hypertrophic cardiomyopathy, hypertension, hyperlipidemia, palpitations, who presents for follow-up for hypertrophic cardiomyopathy after recent testing.  Past Medical History    Past Medical History:  Diagnosis Date   Breast cancer ([place])    Chest pain    Dyspnea    Family history of adverse reaction to anesthesia    SON-LUNGS FILLED UP WITH FLUID AFTER APPENDECTOMY   Hematuria    HOCM (hypertrophic obstructive cardiomyopathy) ([place])    a. 10/2019 Echo: EF 70-75%, mod LVH. No rwma. Mild SAM, mild LVOT obs(peak grad 20 @ rest 26 w/ Valsalva); b. 01/2022 cMRI: EF 73%, sev asymm LV basal septal hypertrophy- basal segment 1.6cm. Transmural LGE/scar in LV basal segment (point of max thickness & RV insertion). SAM->mild MR & LVOT obs-->HOCM.   Hypercholesterolemia    Hypertension    Nonobstructive CAD (coronary artery disease)    a.10/2015 MV: No ischemia/infarct; b. 11/2019 Cor CTA: mild nonobs LAD dzs; c. 12/2021 Cor CTA: Ca2+ = 216 (87th%'ile). LM nl, LAD 25-49p, LCX nondom, nl, RCA <25ost.   Palpitations    a. 12/2021 Zio: RSR @ 72 (52-182). 3 runs NSVT (longest 5 beats, fastest 152 x 4 beats). 10 SVT runs (longest 15.1 secs @ 103, fastest 4 beats @ 182). Rare PACs/PVCs.   Personal history of radiation therapy    Pulmonary nodule    a. 12/2021 CT chest: stable 15m RLL pulm nodule, likely benign, No f/u imaging req.   Systolic murmur    Past Surgical History:  Procedure Laterality Date   BREAST BIOPSY Left 10/27/2019   Affirm bx-distortion-     clip-path ADH   BREAST BIOPSY Left 01/07/2020   Affim bx 1 area/ x clip and ribbon clip/ ADH   BREAST LUMPECTOMY     BREAST LUMPECTOMY WITH NEEDLE LOCALIZATION Left 02/14/2020   Procedure: BREAST  LUMPECTOMY WITH NEEDLE LOCALIZATION;  Surgeon: BRobert Bellow MD;  Location: [facility] ORS;  Service: General;  Laterality: Left;   CHOLECYSTECTOMY  10/2020   FACIAL FRACTURE SURGERY     X 2    Allergies  Allergies  Allergen Reactions   Macrobid [Nitrofurantoin Macrocrystal]     unknown   Penicillins     Childhood allergy    History of Present Illness    66year old female with above past medical history including nonobstructive CAD, strong family history of premature CAD (mother-CABG in her 565sand brother with stents/MI in his 560s, hypertension, hyperlipidemia, LVH/hypertrophic cardiomyopathy, and palpitations.  In 2017, she was evaluated for systolic murmur and dyspnea and underwent stress testing which was nonischemic.  Echo showed normal LV function without any significant valvular abnormalities or documentation of outflow tract obstruction.  In 2021, she was seen in the emergency department with episodic chest tightness, dyspnea, and vomiting that would occur while lifting heavy items or squatting.  ECG was notable for LVH with strain.  High-sensitivity troponin was minimally elevated at 30.  She was noted to have a systolic murmur on examination that accentuated with Valsalva.  Echocardiogram showed hyperdynamic LV function with an EF of 70 to 75% with moderate LVH and mild LVOT obstruction with peak gradient of 20 mmHg at rest and 26 mmHg with Valsalva.  She was placed on beta-blocker therapy with recommendation for coronary  CT angiography.  This was performed in June 2021 showing mild nonobstructive LAD disease with a coronary calcium score of 182 (88th percentile).  She was continued on statin therapy.  In the setting of hypertrophic cardiomyopathy, she has been managed with beta-blocker therapy.  She was recently seen in July of this year secondary to progressive chest discomfort with minimal exertion.  Toprol-XL was increased to 50 mg daily.  Cardiac MRI was performed and showed an EF  of 73% with severe asymmetric LV basal septal hypertrophy and transmural LGE/scar in the LV basal segment.  Systolic anterior motion with mild MR and LVOT obstruction were noted.  Repeat coronary CT angiogram was performed and showed mild to moderate nonobstructive LAD and RCA disease.  Finally, an event monitor was placed and showed 3 brief runs of nonsustained VT and 10 brief runs of SVT.  Since her last visit and testing, she has noted some improvement in symptoms.  She thinks the additional beta-blocker has helped.  She has had fewer palpitations and also improved activity tolerance.  She does limit her activity some as she is aware of what types of activities might trigger symptoms of chest pain.  She may have had 1 or 2 episodes of chest discomfort since her last visit.  These will typically occur when she is bending down and doing something like pulling weeds in the yard.  She denies PND, orthopnea, dizziness, syncope, edema, or early satiety.  Home Medications    Current Outpatient Medications  Medication Sig Dispense Refill   aspirin EC 81 MG EC tablet Take 1 tablet (81 mg total) by mouth daily. 30 tablet 0   atorvastatin (LIPITOR) 40 MG tablet Take 1 tablet (40 mg total) by mouth daily. 90 tablet 3   Calcium Carbonate-Vitamin D3 600-400 MG-UNIT TABS Take by mouth.     Magnesium 250 MG TABS Take by mouth.     Multiple Vitamin (MULTIVITAMIN) tablet Take 1 tablet by mouth daily.     sertraline (ZOLOFT) 50 MG tablet Take 1 tablet by mouth once daily 90 tablet 1   tamoxifen (NOLVADEX) 20 MG tablet Take 20 mg by mouth daily.     tobramycin-dexamethasone (TOBRADEX) ophthalmic ointment Place 1 Application into the left eye 2 (two) times daily. 3.5 g 0   Vitamin D, Cholecalciferol, 10 MCG (400 UNIT) CAPS Take by mouth.     metoprolol succinate (TOPROL XL) 50 MG 24 hr tablet Take 1.5 tablets (75 mg) by mouth once daily 45 tablet 6   No current facility-administered medications for this visit.      Review of Systems    Improved, though still occas c/p and palpitations.  Stable DOE. She denies pnd, orthopnea, n, v, dizziness, syncope, edema, weight gain, or early satiety. .  All other systems reviewed and are otherwise negative except as noted above.    Physical Exam    VS:  BP 122/60 (BP Location: Left Arm, Patient Position: Sitting, Cuff Size: Normal)   Pulse 70   Ht '5\' 5"'$  (1.651 m)   Wt 172 lb (78 kg)   LMP 06/24/2010   SpO2 97%   BMI 28.62 kg/m  , BMI Body mass index is 28.62 kg/m.     GEN: Well nourished, well developed, in no acute distress. HEENT: normal. Neck: Supple, no JVD, carotid bruits, or masses. Cardiac: RRR, 3/6 systolic murmur heard throughout.  No rubs or gallops.  No clubbing, cyanosis, edema.  Radials/PT 2+ and equal bilaterally.  Respiratory:  Respirations regular  and unlabored, clear to auscultation bilaterally. GI: Soft, nontender, nondistended, BS + x 4. MS: no deformity or atrophy. Skin: warm and dry, no rash. Neuro:  Strength and sensation are intact. Psych: Normal affect.  Accessory Clinical Findings     Lab Results  Component Value Date   WBC 8.3 02/22/2022   HGB 14.2 02/22/2022   HCT 41.8 02/22/2022   MCV 92.3 02/22/2022   PLT 136.0 (L) 02/22/2022   Lab Results  Component Value Date   CREATININE 0.88 02/22/2022   BUN 16 02/22/2022   NA 142 02/22/2022   K 4.1 02/22/2022   CL 109 02/22/2022   CO2 26 02/22/2022   Lab Results  Component Value Date   ALT 42 (H) 02/22/2022   AST 31 02/22/2022   ALKPHOS 62 02/22/2022   BILITOT 0.9 02/22/2022   Lab Results  Component Value Date   CHOL 108 01/02/2022   HDL 39.90 01/02/2022   LDLCALC 55 01/02/2022   LDLDIRECT 57 01/19/2020   TRIG 70.0 01/02/2022   CHOLHDL 3 01/02/2022    Lab Results  Component Value Date   HGBA1C 5.2 03/30/2013    Assessment & Plan    1.  Hypertrophic obstructive cardiomyopathy: Recent cardiac MRI with an EF of 73% and severe asymmetric left  ventricular basal septal hypertrophy with transmural LGE suggestive of hypertrophic obstructive cardiomyopathy. SAM and mild mitral regurgitation also noted with LV outflow tract obstruction.  At last visit, metoprolol therapy was increased to 50 mg once a day and since then, patient has noted some improvement in activity tolerance with fewer episodes of chest discomfort, dyspnea, and palpitations.  I am going to increase her metoprolol XL further to 75 mg once a day today.  We also discussed options for additional management and evaluation and she agreed to a referral to Dr. Gasper Sells in [place].  Stressed importance of adequate hydration and avoidance of activities that resulted in bearing down.  2.  Essential hypertension: Stable.  Titrating metoprolol in the setting of above.  3.  Nonobstructive CAD: Recent coronary CT angiogram showed mild to moderate nonobstructive LAD and RCA disease.  She remains on aspirin, beta-blocker, and statin therapy.  4.  Palpitations: Recent event monitoring with 3 brief runs of nonsustained VT and 10 relatively brief runs of SVT.  She feels that symptoms are overall improved with higher dose of beta-blocker and I will titrate further to 75 mg daily today.  5.  Disposition: Follow-up with Dr. Gasper Sells in [place] for additional evaluation and management of hypertrophic cardiomyopathy.  Murray Hodgkins, NP 02/27/2022, 6:00 PM

## 2022-03-01 ENCOUNTER — Other Ambulatory Visit
Admission: RE | Admit: 2022-03-01 | Discharge: 2022-03-01 | Disposition: A | Discharge status: Home / Self Care | Payer: Medicare HMO | Source: Ambulatory Visit | Attending: Internal Medicine | Admitting: Internal Medicine

## 2022-03-01 DIAGNOSIS — R197 Diarrhea, unspecified: Secondary | ICD-10-CM | POA: Diagnosis present

## 2022-03-01 LAB — GASTROINTESTINAL PANEL BY PCR, STOOL (REPLACES STOOL CULTURE)

## 2022-03-11 ENCOUNTER — Other Ambulatory Visit (INDEPENDENT_AMBULATORY_CARE_PROVIDER_SITE_OTHER): Payer: Medicare HMO

## 2022-03-11 DIAGNOSIS — I10 Essential (primary) hypertension: Secondary | ICD-10-CM

## 2022-03-11 DIAGNOSIS — R7989 Other specified abnormal findings of blood chemistry: Secondary | ICD-10-CM

## 2022-03-11 DIAGNOSIS — E782 Mixed hyperlipidemia: Secondary | ICD-10-CM | POA: Diagnosis not present

## 2022-03-11 LAB — HEPATIC FUNCTION PANEL
ALT: 37 U/L — ABNORMAL HIGH (ref 0–35)
AST: 29 U/L (ref 0–37)
Albumin: 3.7 g/dL (ref 3.5–5.2)
Alkaline Phosphatase: 63 U/L (ref 39–117)
Bilirubin, Direct: 0.2 mg/dL (ref 0.0–0.3)
Total Bilirubin: 0.8 mg/dL (ref 0.2–1.2)
Total Protein: 6.1 g/dL (ref 6.0–8.3)

## 2022-03-11 NOTE — Addendum Note (Signed)
Addended by: Leeanne Rio on: 03/11/2022 09:23 AM   Modules accepted: Orders

## 2022-03-13 ENCOUNTER — Telehealth: Payer: Self-pay | Admitting: Internal Medicine

## 2022-03-13 ENCOUNTER — Other Ambulatory Visit: Payer: Self-pay | Admitting: Internal Medicine

## 2022-03-13 DIAGNOSIS — R7989 Other specified abnormal findings of blood chemistry: Secondary | ICD-10-CM

## 2022-03-13 NOTE — Progress Notes (Signed)
Order placed for abdominal ultrasound.   

## 2022-03-13 NOTE — Telephone Encounter (Signed)
Pt needs a call regarding diarrhea since the middle of July. Pt states it was better now has come back. Please advise and Thank you!  Call pt # [phone].

## 2022-03-13 NOTE — Telephone Encounter (Signed)
LMTCB to see if I could get patient scheduled with Dr. Nicki Reaper or another provider to evaluate.

## 2022-03-14 NOTE — Telephone Encounter (Signed)
Called and informed patient that she could schedule appt to come back into office, but patient declined because she just does not know what else could be done in office?

## 2022-03-15 ENCOUNTER — Other Ambulatory Visit: Payer: Self-pay | Admitting: Internal Medicine

## 2022-03-19 ENCOUNTER — Other Ambulatory Visit: Payer: Self-pay | Admitting: Internal Medicine

## 2022-03-24 NOTE — Addendum Note (Signed)
Encounter addended by: Einar Pheasant, MD on: 03/24/2022 2:44 PM  Actions taken: Problem List modified

## 2022-03-26 ENCOUNTER — Telehealth: Payer: Self-pay | Admitting: Internal Medicine

## 2022-03-26 DIAGNOSIS — R140 Abdominal distension (gaseous): Secondary | ICD-10-CM

## 2022-03-26 DIAGNOSIS — R197 Diarrhea, unspecified: Secondary | ICD-10-CM

## 2022-03-26 NOTE — Telephone Encounter (Signed)
I called pt to sch US abdomen. Pt mentioned that she's having diarrhea and wanted to now if she have a scan for that added to the scan now? Please advise and Thank you!

## 2022-03-26 NOTE — Telephone Encounter (Signed)
Attempted to call patient twice & phone only rang. Will try back.

## 2022-03-26 NOTE — Telephone Encounter (Signed)
Need more information.  How many episodes of diarrhea?  Any fever, abdominal pain, etc.?  How long going on?

## 2022-03-27 NOTE — Telephone Encounter (Signed)
I called and spoke wit patient she stated that these episodes happed 2-5 times a day. Just twice on a good day & started back in July. BM's are usually watery but occasionally just soft. She has not fever & no pain really, but states that she feels that she stays bloated.

## 2022-03-27 NOTE — Telephone Encounter (Signed)
Instead of ultrasound, given symptoms, I recommend CT abdomen and pelvis.  Please confirm with pt no allergy to contrast dye, shellfish, iodine.  Have already checked previous stool studies and after the test, she had stated symptoms were better.  With persistent symptoms - if agreeable - would obtain CT instead of ultrasound.  Let me know if agreeable.

## 2022-03-28 NOTE — Addendum Note (Signed)
Addended by: Alisa Graff on: 03/28/2022 09:28 PM   Modules accepted: Orders

## 2022-03-28 NOTE — Telephone Encounter (Signed)
Order placed for CT scan.  

## 2022-03-28 NOTE — Telephone Encounter (Signed)
Pt was agreeable to CT as opposed to [place]. She also confirmed no allergies to contrast dyes, iodine or shellfish.

## 2022-04-08 ENCOUNTER — Ambulatory Visit
Admission: RE | Admit: 2022-04-08 | Discharge: 2022-04-08 | Disposition: A | Discharge status: Home / Self Care | Payer: Medicare HMO | Source: Ambulatory Visit | Attending: Internal Medicine | Admitting: Internal Medicine

## 2022-04-08 DIAGNOSIS — R197 Diarrhea, unspecified: Secondary | ICD-10-CM | POA: Insufficient documentation

## 2022-04-08 DIAGNOSIS — N281 Cyst of kidney, acquired: Secondary | ICD-10-CM | POA: Diagnosis not present

## 2022-04-08 DIAGNOSIS — R140 Abdominal distension (gaseous): Secondary | ICD-10-CM | POA: Diagnosis not present

## 2022-04-08 DIAGNOSIS — K760 Fatty (change of) liver, not elsewhere classified: Secondary | ICD-10-CM | POA: Diagnosis not present

## 2022-04-08 MED ORDER — IOHEXOL 300 MG/ML  SOLN
100.0000 mL | Freq: Once | INTRAMUSCULAR | Status: AC | PRN
  Administered 2022-04-08: 100 mL via INTRAVENOUS

## 2022-04-10 ENCOUNTER — Other Ambulatory Visit: Payer: Self-pay | Admitting: Internal Medicine

## 2022-04-10 DIAGNOSIS — R197 Diarrhea, unspecified: Secondary | ICD-10-CM

## 2022-04-10 DIAGNOSIS — R935 Abnormal findings on diagnostic imaging of other abdominal regions, including retroperitoneum: Secondary | ICD-10-CM

## 2022-04-10 NOTE — Progress Notes (Unsigned)
Cardiology Office Note:    Date:  04/11/2022   ID:  Mary Walter, DOB 1956/06/18, MRN [id number]  PCP:  Einar Pheasant, [place] Providers Cardiologist:  Ida Rogue, MD     Referring MD: Wilder Glade*   CC: [facility] Consulted for the evaluation of CMI discussion at the behest of [place]  History of Present Illness:    Mary Walter is a 66 y.o. female with a hx of HCM who presents for evaluation peak gradient in 2021 was 26 mm Hg.  Prior to starting anything therapy, she couldn't mop the floor because of fatigue, SOB, and chest pain.   She started on metoprolol and this improved.  Even with her max dose of metoprolol; she cannot walk a full [employer] because she DOE. Still have some chest pain. Notes some fatigue. Notes notes palpitations Notes notes CP. Notes no dizziness. Notes no syncope. Notable family events include father with HCM.Has children; they have not been testing. No family history of SCD.    She is at her maximally tolerate dose of metoprolol and is still having breakthrough symptoms.  Past Medical History:  Diagnosis Date   Breast cancer ([place])    Chest pain    Dyspnea    Family history of adverse reaction to anesthesia    SON-LUNGS FILLED UP WITH FLUID AFTER APPENDECTOMY   Hematuria    HOCM (hypertrophic obstructive cardiomyopathy) ([place])    a. 10/2019 Echo: EF 70-75%, mod LVH. No rwma. Mild SAM, mild LVOT obs(peak grad 20 @ rest 26 w/ Valsalva); b. 01/2022 cMRI: EF 73%, sev asymm LV basal septal hypertrophy- basal segment 1.6cm. Transmural LGE/scar in LV basal segment (point of max thickness & RV insertion). SAM->mild MR & LVOT obs-->HOCM.   Hypercholesterolemia    Hypertension    Nonobstructive CAD (coronary artery disease)    a.10/2015 MV: No ischemia/infarct; b. 11/2019 Cor CTA: mild nonobs LAD dzs; c. 12/2021 Cor CTA: Ca2+ = 216 (87th%'ile). LM nl, LAD 25-49p, LCX nondom, nl, RCA <25ost.   Palpitations    a.  12/2021 Zio: RSR @ 72 (52-182). 3 runs NSVT (longest 5 beats, fastest 152 x 4 beats). 10 SVT runs (longest 15.1 secs @ 103, fastest 4 beats @ 182). Rare PACs/PVCs.   Personal history of radiation therapy    Pulmonary nodule    a. 12/2021 CT chest: stable 81m RLL pulm nodule, likely benign, No f/u imaging req.   Systolic murmur     Past Surgical History:  Procedure Laterality Date   BREAST BIOPSY Left 10/27/2019   Affirm bx-distortion-     clip-path ADH   BREAST BIOPSY Left 01/07/2020   Affim bx 1 area/ x clip and ribbon clip/ ADH   BREAST LUMPECTOMY     BREAST LUMPECTOMY WITH NEEDLE LOCALIZATION Left 02/14/2020   Procedure: BREAST LUMPECTOMY WITH NEEDLE LOCALIZATION;  Surgeon: BRobert Bellow MD;  Location: [facility] ORS;  Service: General;  Laterality: Left;   CHOLECYSTECTOMY  10/2020   FACIAL FRACTURE SURGERY     X 2    Current Medications: Current Meds  Medication Sig   aspirin EC 81 MG EC tablet Take 1 tablet (81 mg total) by mouth daily.   atorvastatin (LIPITOR) 40 MG tablet Take 1 tablet by mouth once daily   Calcium Carbonate-Vitamin D3 600-400 MG-UNIT TABS Take by mouth.   Magnesium 250 MG TABS Take by mouth.   metoprolol succinate (TOPROL XL) 50 MG 24 hr tablet Take 1.5 tablets (75  mg) by mouth once daily   Multiple Vitamin (MULTIVITAMIN) tablet Take 1 tablet by mouth daily.   sertraline (ZOLOFT) 50 MG tablet Take 1 tablet by mouth once daily   tamoxifen (NOLVADEX) 20 MG tablet Take 20 mg by mouth daily.   tobramycin-dexamethasone (TOBRADEX) ophthalmic ointment Place 1 Application into the left eye 2 (two) times daily.   Vitamin D, Cholecalciferol, 10 MCG (400 UNIT) CAPS Take by mouth.     Allergies:   Macrobid [nitrofurantoin macrocrystal] and Penicillins   Social History   Socioeconomic History   Marital status: Married    Spouse name: Not on file   Number of children: 2   Years of education: Not on file   Highest education level: Not on file  Occupational  History   Not on file  Tobacco Use   Smoking status: Never   Smokeless tobacco: Never  Vaping Use   Vaping Use: Never used  Substance and Sexual Activity   Alcohol use: No    Alcohol/week: 0.0 standard drinks of alcohol   Drug use: No   Sexual activity: Not Currently  Other Topics Concern   Not on file  Social History Narrative   Not on file   Social Determinants of Health   Financial Resource Strain: Low Risk  (02/01/2022)   Overall Financial Resource Strain (CARDIA)    Difficulty of Paying Living Expenses: Not hard at all  Food Insecurity: No Food Insecurity (02/01/2022)   Hunger Vital Sign    Worried About Running Out of Food in the Last Year: Never true    Ran Out of Food in the Last Year: Never true  Transportation Needs: No Transportation Needs (02/01/2022)   PRAPARE - Hydrologist (Medical): No    Lack of Transportation (Non-Medical): No  Physical Activity: Not on file  Stress: No Stress Concern Present (02/01/2022)   [place]    Feeling of Stress : Not at all  Social Connections: Not on file     Family History: The patient's family history includes Breast cancer in her cousin and sister; CVA in her father and paternal grandmother; Cancer in her father; Diabetes in her mother; Diverticulitis in her father; Heart attack in her brother; Heart disease in her father and mother; Heart murmur in her father; Hypertension in her father and mother; Stroke in her father.  ROS:   Please see the history of present illness.     All other systems reviewed and are negative.  EKGs/Labs/Other Studies Reviewed:    The following studies were reviewed today:  NM Myocar Multi W/Spect W/Wall Motion / EF 11/03/2015  Narrative  Blood pressure demonstrated a hypertensive response to exercise.  Horizontal ST segment depression ST segment depression of 2 mm was noted during stress in the II,  III, aVF, V3, V4, V5 and V6 leads.  The study is normal.  This is a low risk study.  The left ventricular ejection fraction is normal (55-65%).  Abnormal exercise ECG. However, there is LVH on baseline ECG and thus these changes might represent strain. Correlate clinically.   No results found for this or any previous visit from the past 3650 days.   ECHO COMPLETE WO IMAGING ENHANCING AGENT 11/20/2019  Narrative ECHOCARDIOGRAM REPORT    Patient Name:   Mary Walter Date of Exam: 11/20/2019 Medical Rec #:  [id number]          Height:  65.0 in Accession #:    [id number]         Weight:       168.6 lb Date of Birth:  18-Apr-1956          BSA:          1.840 m Patient Age:    20 years           BP:           134/77 mmHg Patient Gender: F                  HR:           63 bpm. Exam Location:  [facility]  Procedure: 2D Echo  Indications:     Chest Pain  History:         Patient has prior history of Echocardiogram examinations. Risk Factors:Hypertension.  Sonographer:     L Thornton-Maynard Referring Phys:  [place]: Kathlyn Sacramento MD  IMPRESSIONS   1. Left ventricular ejection fraction, by estimation, is 70 to 75%. The left ventricle has hyperdynamic function. The left ventricle has no regional wall motion abnormalities. There is moderate left ventricular hypertrophy. Left ventricular diastolic parameters are indeterminate. Mild systolic anterior motion of the mitral valve with mild LVOT obstruction. Peak gradient of 20 mm Hg at rest and 26 mm Hg with Valsalva. 2. Right ventricular systolic function is normal. The right ventricular size is normal. There is normal pulmonary artery systolic pressure. 3. Left atrial size was mildly dilated. 4. The mitral valve is normal in structure. No evidence of mitral valve regurgitation. No evidence of mitral stenosis. 5. The aortic valve is normal in structure. Aortic valve regurgitation is not visualized. No  aortic stenosis is present. 6. The inferior vena cava is normal in size with greater than 50% respiratory variability, suggesting right atrial pressure of 3 mmHg.  FINDINGS Left Ventricle: Left ventricular ejection fraction, by estimation, is 70 to 75%. The left ventricle has hyperdynamic function. The left ventricle has no regional wall motion abnormalities. The left ventricular internal cavity size was normal in size. There is moderate left ventricular hypertrophy. Left ventricular diastolic parameters are indeterminate.  Right Ventricle: The right ventricular size is normal. No increase in right ventricular wall thickness. Right ventricular systolic function is normal. There is normal pulmonary artery systolic pressure. The tricuspid regurgitant velocity is 2.03 m/s, and with an assumed right atrial pressure of 10 mmHg, the estimated right ventricular systolic pressure is 38.1 mmHg.  Left Atrium: Left atrial size was mildly dilated.  Right Atrium: Right atrial size was normal in size.  Pericardium: There is no evidence of pericardial effusion.  Mitral Valve: The mitral valve is normal in structure. There is mild thickening of the mitral valve leaflet(s). Normal mobility of the mitral valve leaflets. No evidence of mitral valve regurgitation. No evidence of mitral valve stenosis.  Tricuspid Valve: The tricuspid valve is normal in structure. Tricuspid valve regurgitation is not demonstrated. No evidence of tricuspid stenosis.  Aortic Valve: The aortic valve is normal in structure. Aortic valve regurgitation is not visualized. No aortic stenosis is present. Aortic valve mean gradient measures 9.0 mmHg. Aortic valve peak gradient measures 17.6 mmHg. Aortic valve area, by VTI measures 3.18 cm.  Pulmonic Valve: The pulmonic valve was normal in structure. Pulmonic valve regurgitation is not visualized. No evidence of pulmonic stenosis.  Aorta: The aortic root is normal in size and  structure.  Venous: The inferior vena cava  is normal in size with greater than 50% respiratory variability, suggesting right atrial pressure of 3 mmHg.  IAS/Shunts: No atrial level shunt detected by color flow Doppler.   LEFT VENTRICLE PLAX 2D LVIDd:         4.19 cm  Diastology LVIDs:         2.27 cm  LV e' lateral:   4.46 cm/s LV PW:         1.88 cm  LV E/e' lateral: 16.5 LV IVS:        1.71 cm  LV e' medial:    4.68 cm/s LVOT diam:     1.90 cm  LV E/e' medial:  15.7 LV SV:         128 LV SV Index:   70 LVOT Area:     2.84 cm   RIGHT VENTRICLE RV S prime:     11.30 cm/s TAPSE (M-mode): 3.4 cm  LEFT ATRIUM             Index LA diam:        3.10 cm 1.69 cm/m LA Vol (A2C):   73.7 ml 40.06 ml/m LA Vol (A4C):   74.2 ml 40.33 ml/m LA Biplane Vol: 79.6 ml 43.27 ml/m AORTIC VALVE AV Area (Vmax):    3.00 cm AV Area (Vmean):   3.40 cm AV Area (VTI):     3.18 cm AV Vmax:           210.00 cm/s AV Vmean:          131.000 cm/s AV VTI:            0.404 m AV Peak Grad:      17.6 mmHg AV Mean Grad:      9.0 mmHg LVOT Vmax:         222.00 cm/s LVOT Vmean:        157.000 cm/s LVOT VTI:          0.452 m LVOT/AV VTI ratio: 1.12  AORTA Ao Root diam: 4.00 cm  MITRAL VALVE               TRICUSPID VALVE MV Area (PHT): 2.89 cm    TR Peak grad:   16.5 mmHg MV E velocity: 73.70 cm/s  TR Vmax:        203.00 cm/s MV A velocity: 87.80 cm/s MV E/A ratio:  0.84        SHUNTS Systemic VTI:  0.45 m Systemic Diam: 1.90 cm  Kathlyn Sacramento MD Electronically signed by Kathlyn Sacramento MD Signature Date/Time: 11/20/2019/3:52:50 PM    LONG TERM MONITOR (8-14 DAYS) INTERPRETATION 02/07/2022  Narrative Patch Wear Time:  13 days and 17 hours (2023-07-25T21:29:14-0400 to 2023-08-08T15:11:09-398)  Patient had a min HR of 52 bpm, max HR of 182 bpm, and avg HR of 72 bpm. Predominant underlying rhythm was Sinus Rhythm. Bundle Branch Block/IVCD was present. QRS morphology changes were present  throughout recording. 3 Ventricular Tachycardia runs occurred, the run with the fastest interval lasting 4 beats with a max rate of 152 bpm, the longest lasting 5 beats with an avg rate of 122 bpm. 10 Supraventricular Tachycardia runs occurred, the run with the fastest interval lasting 4 beats with a max rate of 182 bpm, the longest lasting 15.1 secs with an avg rate of 103 bpm. Isolated SVEs were rare (<1.0%), SVE Couplets were rare (<1.0%), and SVE Triplets were rare (<1.0%). Isolated VEs were rare (<1.0%), VE Couplets were rare (<1.0%), and no VE Triplets were  present.  Conclusion Paroxysmal SVTs, 3 short runs of VT lasting 4 beats noted. Overall no significant/persistent arrhythmias. Patient triggered events associated with rare PACs.   CT CORONARY MORPH W/CTA COR W/SCORE W/CA W/CM &/OR WO/CM 01/14/2022  Addendum 01/14/2022  2:36 PM ADDENDUM REPORT: 01/14/2022 14:34  EXAM: OVER-READ INTERPRETATION  CT CHEST  The following report is an over-read performed by radiologist Dr. Jason Nest [facility] on 01/14/2022. This over-read does not include interpretation of cardiac or coronary anatomy or pathology. The coronary calcium score interpretation by the cardiologist is attached.  COMPARISON:  CT 12/16/2019.  FINDINGS: Vascular: No significant cardiovascular findings. Coronary artery calcifications.  Mediastinum/Nodes: No lymphadenopathy.  Lungs/Pleura: Stable 4 mm right lower lobe pulmonary nodule (series 12, image 6). This is stable since June 2021 and benign. No new suspicious pulmonary nodules.  Upper Abdomen: No acute findings. Low-density 1.1 cm hepatic lesion consistent with a cyst, unchanged from prior exam.  Musculoskeletal: No acute osseous abnormality. No suspicious osseous lesion.  IMPRESSION: Stable 4 mm pulmonary nodule in the right lower lobe. This is stable since June 2021 and is likely benign. No follow-up imaging is recommended.  No new  suspicious pulmonary nodules.   Electronically Signed By: Maurine Simmering M.D. On: 01/14/2022 14:34  Narrative CLINICAL DATA:  Chest pain  EXAM: Cardiac/Coronary  CTA  TECHNIQUE: The patient was scanned on a [employer].  FINDINGS: A 100 kV prospective scan was triggered in the descending thoracic aorta at 111 HU's. Axial non-contrast 3 mm slices were carried out through the heart. The data set was analyzed on a dedicated work station and scored using the [place]. Gantry rotation speed was 250 msecs and collimation was .6 mm. No beta blockade and 0.8 mg of sl NTG was given. The 3D data set was reconstructed in 5% intervals of the 67-82 % of the R-R cycle. Diastolic phases were analyzed on a dedicated work station using MPR, MIP and VRT modes. The patient received 80 cc of contrast.  Aorta: Normal size. Aortic root and descending aorta calcifications. No dissection.  Aortic Valve:  Trileaflet.  No calcifications.  Coronary Arteries:  Normal coronary origin.  Right dominance.  RCA is a dominant artery that gives rise to PDA and PLA. There is calcified plaque in the ostial RCA causing minimal stenosis (<25%).  Left main gives rise to LAD and LCX arteries.  LM has no disease.  LAD has calcified plaque in the proximal segment causing mild stenosis (25-49%).  LCX is a non-dominant artery that gives rise to two obtuse marginal branches. There is no plaque.  Other findings:  Normal pulmonary vein drainage into the left atrium.  Normal left atrial appendage without a thrombus.  Normal size of the pulmonary artery.  IMPRESSION: 1. Coronary calcium score of 216. This was 87th percentile for age and sex matched control.  2. Normal coronary origin with right dominance.  3. Calcified plaque in the proximal LAD causing mild stenosis (25-49%).  4. Minimal stenosis in the ostial RCA (<25%).  5. CAD-RADS 2. Mild non-obstructive CAD (25-49%).  Consider non-atherosclerotic causes of chest pain. Consider preventive therapy and risk factor modification.  Electronically Signed: By: Kate Sable M.D. On: 01/14/2022 12:40   No results found for this or any previous visit from the past 3650 days.       MR CARDIAC MORPHOLOGY W WO CONTRAST 01/30/2022  Narrative CLINICAL DATA:  Hx of moderate LVH, evaluate for fibrosis, evaluate for HCM  EXAM: CARDIAC  MRI  TECHNIQUE: The patient was scanned on a 1.5 Tesla Siemens magnet. A dedicated cardiac coil was used. Functional imaging was done using Fiesta sequences. 2,3, and 4 chamber views were done to assess for RWMA's. Modified Simpson's rule using a short axis stack was used to calculate an ejection fraction on a dedicated work Conservation officer, nature. The patient received 10 cc of Gadavist. After 10 minutes inversion recovery sequences were used to assess for infiltration and scar tissue. Phase contrast velocity mapping performed.  CONTRAST:  10 cc  of Gadavist  FINDINGS: 1.  Normal left ventricular size and systolic function (LVEF = 73%).  There is severe asymmetric basal septal hypertrophy, basal septal segment measuring 1.6 cm.  There is systolic anterior motion (SAM) of mitral valve causing LVOT obstruction and mild Mitral regurgitation.  There are no regional wall motion abnormalities.  There is transmural late gadolinium enhancement (scar) in the left ventricular myocardium-basal segment at the point of RV insertion.  LVEDV: 184 ml  LVESV: 49 ml  SV: 135 ml  CO: 8.8 L/min  Myocardial mass: 197 g  2. Normal right ventricular size, thickness and systolic function (RVEF = 54%). There are no regional wall motion abnormalities.  3.  Normal left and right atrial size.  4. Normal size of the aortic root, ascending aorta and pulmonary artery.  Systemic flow: Total volume 91.25 ml, RF 2.25%, RV 2.17m.  Pulmonary flow: Total volume 89.3 ml, RF  1.76%, RV 1.670m Qp/Qs: 0.98  5.  Mild mitral regurgitation  6.  Normal pericardium.  No pericardial effusion.  IMPRESSION: 1. Normal LV and RV function, LVEF 73%  2. Severe asymmetric LV basal septal hypertrophy, basal septal segment measuring 1.6 cm.  3. Transmural LGE/scar present in the LV basal segment (point of maximal thickness and RV insertion)  4. There is systolic anterior motion (SAM) of mitral valve causing LVOT obstruction and mild Mitral regurgitation.  5. Findings consistent with Hypertrophic obstructive Cardiomyopathy (HOCM).   Electronically Signed By: BrKate Sable.D. On: 01/30/2022 09:48     Recent Labs: 09/24/2021: TSH 1.32 02/22/2022: BUN 16; Creatinine, Ser 0.88; Hemoglobin 14.2; Platelets 136.0; Potassium 4.1; Sodium 142 03/11/2022: ALT 37  Recent Lipid Panel    Component Value Date/Time   CHOL 108 01/02/2022 0840   CHOL 126 05/01/2020 1004   TRIG 70.0 01/02/2022 0840   HDL 39.90 01/02/2022 0840   HDL 44 05/01/2020 1004   CHOLHDL 3 01/02/2022 0840   VLDL 14.0 01/02/2022 0840   LDLCALC 55 01/02/2022 0840   LDLCALC 60 05/01/2020 1004   LDLDIRECT 57 01/19/2020 0920   LDLDIRECT 144.1 03/30/2013 0829    Physical Exam:    VS:  BP (!) 122/58   Pulse 63   Ht '5\' 5"'$  (1.651 m)   Wt 172 lb (78 kg)   LMP 06/24/2010   SpO2 98%   BMI 28.62 kg/m     Wt Readings from Last 3 Encounters:  04/11/22 172 lb (78 kg)  02/27/22 172 lb (78 kg)  02/22/22 169 lb (76.7 kg)    GEN:  Well nourished, well developed in no acute distress HEENT: Normal NECK: No JVD; No carotid bruits LYMPHATICS: No lymphadenopathy CARDIAC: RRR, harsh systolic murmur worse with standing, no rubs, gallops RESPIRATORY:  Clear to auscultation without rales, wheezing or rhonchi  ABDOMEN: Soft, non-tender, non-distended MUSCULOSKELETAL:  No edema; No deformity  SKIN: Warm and dry NEUROLOGIC:  Alert and oriented x 3 PSYCHIATRIC:  Normal affect  ASSESSMENT:    1. HOCM  (hypertrophic obstructive cardiomyopathy) ([place])    PLAN:    Hypertrophic Cardiomyopathy - with presents for discussion of mavacamten and myosin modulation medication - discussed - Gradient type: borderline resting gradient in 2021, CMR shows LVOT gradient (study not optimized to quantify) - NYHA Class II on maximally tolerated BB - Notable CYP modulation including none - Insurance is [employer] - discussed the benefits in quality of life and fitness (pVO2), and discussed the benefits of starting mavacamten - given NYHA Class II, I do not think SRT or ASA are best options, but we have discuss risks and benefits of this  - discussed the REMS program:  will need to strictly keep up with medication and supplement checks due to potential interactions - if critical illness (Sepsis, AF RVR, new HFrEF) medication will be stopped and we will see the patient in follow up for potential safe restart - no medication to be augmented Summary: Will do stress echo for inducible gradients; scheduled for 04/22/22 If she has a significant LVOT gradient (> 30 mm Hg) we will attempt mavacamten 5 mg We will not start her therapy around her 06/07/22 [place] trip. ~ 16 week f/u with me   CAD - refill for atorvastatin sent  Time Spent Directly with Patient:   I have spent a total of 60 with the patient reviewing notes, imaging, EKGs, labs and examining the patient as well as establishing an assessment and plan that was discussed personally with the patient.  > 50% of time was spent in direct patient care and family and reviewing imaging with patient. SDM tool used for discussion       Medication Adjustments/Labs and Tests Ordered: Current medicines are reviewed at length with the patient today.  Concerns regarding medicines are outlined above.  Orders Placed This Encounter  Procedures   ECHOCARDIOGRAM STRESS TEST   No orders of the defined types were placed in this encounter.   Patient Instructions   Medication Instructions:  Your physician recommends that you continue on your current medications as directed. Please refer to the Current Medication list given to you today. You may be started on mavacamten [employer]) after you have your Stress Echocardiogram  *If you need a refill on your cardiac medications before your next appointment, please call your pharmacy*   Lab Work: NONE If you have labs (blood work) drawn today and your tests are completely normal, you will receive your results only by: [place] (if you have MyChart) OR A paper copy in the mail If you have any lab test that is abnormal or we need to change your treatment, we will call you to review the results.   Testing/Procedures: Your physician has requested that you have a stress echocardiogram. For further information please visit [email or web address]. Please follow instruction sheet as given.    Follow-Up: At [facility], you and your health needs are our priority.  As part of our continuing mission to provide you with exceptional heart care, we have created designated Provider Care Teams.  These Care Teams include your primary Cardiologist (physician) and Advanced Practice Providers (APPs -  Physician Assistants and Nurse Practitioners) who all work together to provide you with the care you need, when you need it.  We recommend signing up for the patient portal called "MyChart".  Sign up information is provided on this After Visit Summary.  MyChart is used to connect with patients for Virtual Visits (Telemedicine).  Patients are able to view  lab/test results, encounter notes, upcoming appointments, etc.  Non-urgent messages can be sent to your provider as well.   To learn more about what you can do with MyChart, go to [email or web address].    Your next appointment:   16 week(s)  The format for your next appointment:   In Person  Provider:   Rudean Haskell, MD   Important Information  About [place]         Signed, Werner Lean, MD  04/11/2022 12:31 PM    [place]

## 2022-04-10 NOTE — Progress Notes (Signed)
Order placed for GI referral and for pelvic ultrasound

## 2022-04-11 ENCOUNTER — Telehealth: Payer: Self-pay

## 2022-04-11 ENCOUNTER — Other Ambulatory Visit: Payer: Self-pay | Admitting: Cardiovascular Disease

## 2022-04-11 ENCOUNTER — Encounter: Payer: Self-pay | Admitting: Internal Medicine

## 2022-04-11 ENCOUNTER — Ambulatory Visit: Payer: Medicare HMO | Attending: Internal Medicine | Admitting: Internal Medicine

## 2022-04-11 VITALS — BP 122/58 | HR 63 | Ht 65.0 in | Wt 172.0 lb

## 2022-04-11 DIAGNOSIS — I421 Obstructive hypertrophic cardiomyopathy: Secondary | ICD-10-CM

## 2022-04-11 NOTE — Telephone Encounter (Signed)
Patient states she returning call from Cheri Rous, [place].  I was unable to reach Sarah at the time of the call, so I let patient know that I will leave a message letting Judson Roch know that she returned her call.

## 2022-04-11 NOTE — Patient Instructions (Signed)
Medication Instructions:  Your physician recommends that you continue on your current medications as directed. Please refer to the Current Medication list given to you today. You may be started on mavacamten [employer]) after you have your Stress Echocardiogram  *If you need a refill on your cardiac medications before your next appointment, please call your pharmacy*   Lab Work: NONE If you have labs (blood work) drawn today and your tests are completely normal, you will receive your results only by: [place] (if you have MyChart) OR A paper copy in the mail If you have any lab test that is abnormal or we need to change your treatment, we will call you to review the results.   Testing/Procedures: Your physician has requested that you have a stress echocardiogram. For further information please visit [email or web address]. Please follow instruction sheet as given.    Follow-Up: At [facility], you and your health needs are our priority.  As part of our continuing mission to provide you with exceptional heart care, we have created designated Provider Care Teams.  These Care Teams include your primary Cardiologist (physician) and Advanced Practice Providers (APPs -  Physician Assistants and Nurse Practitioners) who all work together to provide you with the care you need, when you need it.  We recommend signing up for the patient portal called "MyChart".  Sign up information is provided on this After Visit Summary.  MyChart is used to connect with patients for Virtual Visits (Telemedicine).  Patients are able to view lab/test results, encounter notes, upcoming appointments, etc.  Non-urgent messages can be sent to your provider as well.   To learn more about what you can do with MyChart, go to [email or web address].    Your next appointment:   16 week(s)  The format for your next appointment:   In Person  Provider:   Rudean Haskell, MD   Important Information About  [place]

## 2022-04-12 NOTE — Telephone Encounter (Signed)
See result note.  

## 2022-04-15 ENCOUNTER — Telehealth (HOSPITAL_COMMUNITY): Payer: Self-pay | Admitting: *Deleted

## 2022-04-15 NOTE — Telephone Encounter (Signed)
Patient given detailed instructions per Stress Test Requisition Sheet for test on 04/22/2022 at 2:30.Patient Notified to arrive 30 minutes early, and that it is imperative to arrive on time for appointment to keep from having the test rescheduled.  Patient verbalized understanding. Mary Walter

## 2022-04-16 ENCOUNTER — Other Ambulatory Visit: Payer: Self-pay | Admitting: Internal Medicine

## 2022-04-17 ENCOUNTER — Ambulatory Visit
Admission: RE | Admit: 2022-04-17 | Discharge: 2022-04-17 | Disposition: A | Discharge status: Home / Self Care | Payer: Medicare HMO | Source: Ambulatory Visit | Attending: Internal Medicine | Admitting: Internal Medicine

## 2022-04-17 DIAGNOSIS — N888 Other specified noninflammatory disorders of cervix uteri: Secondary | ICD-10-CM | POA: Diagnosis not present

## 2022-04-17 DIAGNOSIS — R935 Abnormal findings on diagnostic imaging of other abdominal regions, including retroperitoneum: Secondary | ICD-10-CM | POA: Insufficient documentation

## 2022-04-17 DIAGNOSIS — D252 Subserosal leiomyoma of uterus: Secondary | ICD-10-CM | POA: Diagnosis not present

## 2022-04-17 DIAGNOSIS — D251 Intramural leiomyoma of uterus: Secondary | ICD-10-CM | POA: Diagnosis not present

## 2022-04-18 ENCOUNTER — Other Ambulatory Visit: Payer: Self-pay | Admitting: Cardiovascular Disease

## 2022-04-18 ENCOUNTER — Telehealth: Payer: Self-pay | Admitting: Internal Medicine

## 2022-04-18 NOTE — Telephone Encounter (Signed)
Diane from [facility] called with report. Phone call was accidentally dropped. Notified Elita Quick and he checked patient's chart for results. Dr Nicki Reaper reviewed results yesterday,and office will call patient.

## 2022-04-19 ENCOUNTER — Other Ambulatory Visit: Payer: Self-pay | Admitting: Internal Medicine

## 2022-04-19 DIAGNOSIS — R9389 Abnormal findings on diagnostic imaging of other specified body structures: Secondary | ICD-10-CM

## 2022-04-19 NOTE — Progress Notes (Signed)
Order placed for gyn referral.  

## 2022-04-22 ENCOUNTER — Ambulatory Visit (HOSPITAL_COMMUNITY): Payer: Medicare HMO

## 2022-04-22 ENCOUNTER — Telehealth: Payer: Self-pay

## 2022-04-22 ENCOUNTER — Ambulatory Visit (HOSPITAL_COMMUNITY): Payer: Medicare HMO | Attending: Internal Medicine

## 2022-04-22 DIAGNOSIS — I421 Obstructive hypertrophic cardiomyopathy: Secondary | ICD-10-CM | POA: Insufficient documentation

## 2022-04-22 LAB — ECHOCARDIOGRAM STRESS TEST
AV Mean grad: 13 mmHg
AV Peak grad: 26 mmHg
Ao pk vel: 2.55 m/s
Area-P 1/2: 2.72 cm2
MV M vel: 5.2 m/s
MV Peak grad: 108 mmHg
S' Lateral: 3.2 cm

## 2022-04-22 MED ORDER — MAVACAMTEN 5 MG PO CAPS: 5.0000 mg | ORAL_CAPSULE | Freq: Every day | ORAL | 0 refills | Status: DC

## 2022-04-22 NOTE — Telephone Encounter (Signed)
Pt information entered into Camzyos portal per MD request. "Chandrasekhar, Terisa Starr, MD  Precious Gilding, RN LVEF 70%  LVOT Gradient 79 mm Hg on average  She already filled out the paperwork  I talked with her after her stress echo   Script sent into [place] for free trial.  Paperwork left on Melissa pharmacist desk to follow up.

## 2022-04-23 ENCOUNTER — Telehealth: Payer: Self-pay | Admitting: Cardiovascular Disease

## 2022-04-23 MED ORDER — METOPROLOL SUCCINATE ER 50 MG PO TB24: ORAL_TABLET | ORAL | 0 refills | Status: DC

## 2022-04-23 NOTE — Telephone Encounter (Signed)
*  STAT* If patient is at the pharmacy, call can be transferred to refill team.   1. Which medications need to be refilled? (please list name of each medication and dose if known)   metoprolol succinate (TOPROL XL) 50 MG 24 hr tablet  2. Which pharmacy/location (including street and city if local pharmacy) is medication to be sent to?  [place] ([facility] [place] [address]  3. Do they need a 30 day or 90 day supply?  90 day  Patient stated she is completely out of this medication.

## 2022-04-23 NOTE — Telephone Encounter (Signed)
Requested Prescriptions   Signed Prescriptions Disp Refills   metoprolol succinate (TOPROL XL) 50 MG 24 hr tablet 135 tablet 0    Sig: Take 1.5 tablets (75 mg) by mouth once daily    Authorizing Provider: Minna Merritts    Ordering User: Raelene Bott, Benford Asch L

## 2022-04-25 ENCOUNTER — Telehealth: Payer: Self-pay | Admitting: Pharmacist

## 2022-04-25 NOTE — Telephone Encounter (Signed)
PA for Camzyos submitted  Key: [id number]

## 2022-04-25 NOTE — Telephone Encounter (Addendum)
PA approved through 06/23/22, start form faxed to [email or web address].

## 2022-04-29 ENCOUNTER — Other Ambulatory Visit: Payer: Self-pay | Admitting: General Surgery

## 2022-04-29 DIAGNOSIS — Z79811 Long term (current) use of aromatase inhibitors: Secondary | ICD-10-CM

## 2022-04-29 NOTE — Telephone Encounter (Signed)
Patient has been transferred for help getting patient assistance for [facility]

## 2022-04-30 DIAGNOSIS — R9389 Abnormal findings on diagnostic imaging of other specified body structures: Secondary | ICD-10-CM | POA: Diagnosis not present

## 2022-04-30 DIAGNOSIS — Z79810 Long term (current) use of selective estrogen receptor modulators (SERMs): Secondary | ICD-10-CM | POA: Diagnosis not present

## 2022-05-03 ENCOUNTER — Ambulatory Visit: Payer: Medicare HMO | Admitting: Radiation Oncology

## 2022-05-03 ENCOUNTER — Telehealth: Payer: Self-pay | Admitting: Internal Medicine

## 2022-05-03 DIAGNOSIS — I421 Obstructive hypertrophic cardiomyopathy: Secondary | ICD-10-CM

## 2022-05-03 NOTE — Telephone Encounter (Signed)
Patient returning call. She states she has three questions. First she needs to know if she needs the EKG. Then she says she wants to know if she still takes the metoprolol after the new medication arrives. Lastly she wants to know if the new medication is here by Tuesday, can she go ahead and get a flu shot this weekend.

## 2022-05-03 NOTE — Telephone Encounter (Signed)
Called pt advised to continue to take metoprolol once camzyos is started. Pt will need f/u echocardiograms once start date for Camzyos is determined.  Pt reports medication to be mailed out on Tuesday 05/07/22.  Will check with MD to determine start date.

## 2022-05-03 NOTE — Telephone Encounter (Signed)
Pt c/o medication issue:  1. Name of Medication:  metoprolol succinate (TOPROL XL) 50 MG 24 hr tablet   2. How are you currently taking this medication (dosage and times per day)? As prescribed   3. Are you having a reaction (difficulty breathing--STAT)? No   4. What is your medication issue? Marieli is calling wanting to know if she is to continue this medication once she begins taking Camzyos. She states she was also advised she needs an EKG prior to starting this. Please advise.

## 2022-05-03 NOTE — Telephone Encounter (Signed)
Left a message to call back.

## 2022-05-04 ENCOUNTER — Encounter: Payer: Self-pay | Admitting: Internal Medicine

## 2022-05-04 DIAGNOSIS — R9389 Abnormal findings on diagnostic imaging of other specified body structures: Secondary | ICD-10-CM | POA: Insufficient documentation

## 2022-05-06 NOTE — Telephone Encounter (Signed)
Called pt advised of MD recommendation to start Camzyos on 05/08/22.  Will send a message to Echo scheduler to have 4, 8, 12 week echo scheduled. Pt is aware of need for f/u Echo.

## 2022-05-10 ENCOUNTER — Ambulatory Visit: Payer: Medicare HMO | Admitting: Radiation Oncology

## 2022-05-23 ENCOUNTER — Other Ambulatory Visit: Payer: Self-pay | Admitting: General Surgery

## 2022-05-23 DIAGNOSIS — Z79811 Long term (current) use of aromatase inhibitors: Secondary | ICD-10-CM

## 2022-05-24 ENCOUNTER — Other Ambulatory Visit (INDEPENDENT_AMBULATORY_CARE_PROVIDER_SITE_OTHER): Payer: Medicare HMO

## 2022-05-24 DIAGNOSIS — R7989 Other specified abnormal findings of blood chemistry: Secondary | ICD-10-CM

## 2022-05-24 DIAGNOSIS — I10 Essential (primary) hypertension: Secondary | ICD-10-CM | POA: Diagnosis not present

## 2022-05-24 DIAGNOSIS — E782 Mixed hyperlipidemia: Secondary | ICD-10-CM

## 2022-05-24 LAB — BASIC METABOLIC PANEL
BUN: 15 mg/dL (ref 6–23)
CO2: 25 mEq/L (ref 19–32)
Calcium: 10 mg/dL (ref 8.4–10.5)
Chloride: 111 mEq/L (ref 96–112)
Creatinine, Ser: 0.84 mg/dL (ref 0.40–1.20)
GFR: 71.86 mL/min (ref >60.00)
Glucose, Bld: 94 mg/dL (ref 70–99)
Potassium: 4 mEq/L (ref 3.5–5.1)
Sodium: 142 mEq/L (ref 135–145)

## 2022-05-24 LAB — HEPATIC FUNCTION PANEL
ALT: 35 U/L (ref 0–35)
AST: 24 U/L (ref 0–37)
Albumin: 3.8 g/dL (ref 3.5–5.2)
Alkaline Phosphatase: 69 U/L (ref 39–117)
Bilirubin, Direct: 0.1 mg/dL (ref 0.0–0.3)
Total Bilirubin: 0.5 mg/dL (ref 0.2–1.2)
Total Protein: 5.8 g/dL — ABNORMAL LOW (ref 6.0–8.3)

## 2022-05-24 LAB — LIPID PANEL
Cholesterol: 98 mg/dL (ref 0–200)
HDL: 39.2 mg/dL (ref >39.00)
LDL Cholesterol: 36 mg/dL (ref 0–99)
NonHDL: 58.47
Total CHOL/HDL Ratio: 2
Triglycerides: 112 mg/dL (ref 0.0–149.0)
VLDL: 22.4 mg/dL (ref 0.0–40.0)

## 2022-05-29 ENCOUNTER — Encounter: Payer: Self-pay | Admitting: Internal Medicine

## 2022-05-29 ENCOUNTER — Ambulatory Visit (INDEPENDENT_AMBULATORY_CARE_PROVIDER_SITE_OTHER): Payer: Medicare HMO | Admitting: Internal Medicine

## 2022-05-29 VITALS — BP 128/78 | HR 67 | Temp 98.6°F | Resp 15 | Ht 65.0 in | Wt 169.8 lb

## 2022-05-29 DIAGNOSIS — E782 Mixed hyperlipidemia: Secondary | ICD-10-CM | POA: Diagnosis not present

## 2022-05-29 DIAGNOSIS — I25118 Atherosclerotic heart disease of native coronary artery with other forms of angina pectoris: Secondary | ICD-10-CM

## 2022-05-29 DIAGNOSIS — D696 Thrombocytopenia, unspecified: Secondary | ICD-10-CM

## 2022-05-29 DIAGNOSIS — R9389 Abnormal findings on diagnostic imaging of other specified body structures: Secondary | ICD-10-CM

## 2022-05-29 DIAGNOSIS — R197 Diarrhea, unspecified: Secondary | ICD-10-CM

## 2022-05-29 DIAGNOSIS — I10 Essential (primary) hypertension: Secondary | ICD-10-CM | POA: Diagnosis not present

## 2022-05-29 DIAGNOSIS — F439 Reaction to severe stress, unspecified: Secondary | ICD-10-CM

## 2022-05-29 DIAGNOSIS — I421 Obstructive hypertrophic cardiomyopathy: Secondary | ICD-10-CM | POA: Diagnosis not present

## 2022-05-29 DIAGNOSIS — D0512 Intraductal carcinoma in situ of left breast: Secondary | ICD-10-CM | POA: Diagnosis not present

## 2022-05-29 DIAGNOSIS — R7989 Other specified abnormal findings of blood chemistry: Secondary | ICD-10-CM | POA: Diagnosis not present

## 2022-05-29 DIAGNOSIS — R69 Illness, unspecified: Secondary | ICD-10-CM | POA: Diagnosis not present

## 2022-05-29 MED ORDER — ATORVASTATIN CALCIUM 40 MG PO TABS: 40.0000 mg | ORAL_TABLET | Freq: Every day | ORAL | 1 refills | Status: DC

## 2022-05-29 NOTE — Progress Notes (Signed)
[name], female   DOB: 05-Jul-1955, 66 y.o.   MRN: [id number]   Subjective:    [name], female    DOB: Apr 28, 1956, 66 y.o.   MRN: [id number]   Patient here for  Chief Complaint  Patient presents with   Follow-up   Hypertension   .   HPI Here to follow up regarding hypercholesterolemia, CAD and hypertension.  Seeing cardiology.  CT angiogram was performed and showed mild to moderate nonobstructive LAD and RCA disease. An event monitor was placed and showed 3 brief runs of nonsustained VT and 10 brief runs of SVT.  Saw Dr Gasper Sells for evaluation of hypertrophic cardiomyopathy - to assess - started mavacamten.  Feels better.  SOB has improved.  Has f/p echo next week. Saw gyn - evaluation - endometrial thickening.  Elected f/u ultrasound in 3-6 months.  (Evaluated 11/7/230.  Saw Dr Bary Castilla 05/27/22 - recommended f/u 08/2022 - Dr Lysle Pearl. Bone density scheduled 08/01/22.    Past Medical History:  Diagnosis Date   Breast cancer ([place])    Chest pain    Dyspnea    Family history of adverse reaction to anesthesia    SON-LUNGS FILLED UP WITH FLUID AFTER APPENDECTOMY   Hematuria    HOCM (hypertrophic obstructive cardiomyopathy) ([place])    a. 10/2019 Echo: EF 70-75%, mod LVH. No rwma. Mild SAM, mild LVOT obs(peak grad 20 @ rest 26 w/ Valsalva); b. 01/2022 cMRI: EF 73%, sev asymm LV basal septal hypertrophy- basal segment 1.6cm. Transmural LGE/scar in LV basal segment (point of max thickness & RV insertion). SAM->mild MR & LVOT obs-->HOCM.   Hypercholesterolemia    Hypertension    Nonobstructive CAD (coronary artery disease)    a.10/2015 MV: No ischemia/infarct; b. 11/2019 Cor CTA: mild nonobs LAD dzs; c. 12/2021 Cor CTA: Ca2+ = 216 (87th%'ile). LM nl, LAD 25-49p, LCX nondom, nl, RCA <25ost.   Palpitations    a. 12/2021 Zio: RSR @ 72 (52-182). 3 runs NSVT (longest 5 beats, fastest 152 x 4 beats). 10 SVT runs (longest 15.1 secs @ 103, fastest 4 beats @ 182). Rare  PACs/PVCs.   Personal history of radiation therapy    Pulmonary nodule    a. 12/2021 CT chest: stable 16m RLL pulm nodule, likely benign, No f/u imaging req.   Systolic murmur    Past Surgical History:  Procedure Laterality Date   BREAST BIOPSY Left 10/27/2019   Affirm bx-distortion-     clip-path ADH   BREAST BIOPSY Left 01/07/2020   Affim bx 1 area/ x clip and ribbon clip/ ADH   BREAST LUMPECTOMY     BREAST LUMPECTOMY WITH NEEDLE LOCALIZATION Left 02/14/2020   Procedure: BREAST LUMPECTOMY WITH NEEDLE LOCALIZATION;  Surgeon: BRobert Bellow MD;  Location: [facility] ORS;  Service: General;  Laterality: Left;   CHOLECYSTECTOMY  10/2020   FACIAL FRACTURE SURGERY     X 2   Family History  Problem Relation Age of Onset   Diabetes Mother    Heart disease Mother    Hypertension Mother    Cancer Father        prostate    Diverticulitis Father    CVA Father    Heart disease Father    Stroke Father    Hypertension Father    Heart murmur Father    Breast cancer Sister    Heart attack Brother    CVA Paternal Grandmother    Breast cancer Cousin    Social History  Socioeconomic History   Marital status: Married    Spouse name: Not on file   Number of children: 2   Years of education: Not on file   Highest education level: Not on file  Occupational History   Not on file  Tobacco Use   Smoking status: Never   Smokeless tobacco: Never  Vaping Use   Vaping Use: Never used  Substance and Sexual Activity   Alcohol use: No    Alcohol/week: 0.0 standard drinks of alcohol   Drug use: No   Sexual activity: Not Currently  Other Topics Concern   Not on file  Social History Narrative   Not on file   Social Determinants of Health   Financial Resource Strain: Low Risk  (02/01/2022)   Overall Financial Resource Strain (CARDIA)    Difficulty of Paying Living Expenses: Not hard at all  Food Insecurity: No Food Insecurity (02/01/2022)   Hunger Vital Sign    Worried About Running Out  of Food in the Last Year: Never true    Ran Out of Food in the Last Year: Never true  Transportation Needs: No Transportation Needs (02/01/2022)   PRAPARE - Hydrologist (Medical): No    Lack of Transportation (Non-Medical): No  Physical Activity: Not on file  Stress: No Stress Concern Present (02/01/2022)   [place]    Feeling of Stress : Not at all  Social Connections: Not on file     Review of Systems  Constitutional:  Negative for appetite change and unexpected weight change.  HENT:  Negative for congestion and sinus pressure.   Respiratory:  Negative for cough and chest tightness.        SOB is better.   Cardiovascular:  Negative for chest pain, palpitations and leg swelling.  Gastrointestinal:  Negative for abdominal pain, nausea and vomiting.  Genitourinary:  Negative for difficulty urinating and dysuria.  Musculoskeletal:  Negative for joint swelling and myalgias.  Skin:  Negative for color change and rash.  Neurological:  Negative for dizziness, light-headedness and headaches.  Psychiatric/Behavioral:  Negative for agitation and dysphoric mood.        Objective:     BP 128/78   Pulse 67   Temp 98.6 F (37 C) (Temporal)   Resp 15   Ht '5\' 5"'$  (1.651 m)   Wt 169 lb 12.8 oz (77 kg)   LMP 06/24/2010   SpO2 98%   BMI 28.26 kg/m  Wt Readings from Last 3 Encounters:  05/29/22 169 lb 12.8 oz (77 kg)  04/11/22 172 lb (78 kg)  02/27/22 172 lb (78 kg)    Physical Exam Vitals reviewed.  Constitutional:      General: She is not in acute distress.    Appearance: Normal appearance.  HENT:     Head: Normocephalic and atraumatic.     Right Ear: External ear normal.     Left Ear: External ear normal.  Eyes:     General: No scleral icterus.       Right eye: No discharge.        Left eye: No discharge.     Conjunctiva/sclera: Conjunctivae normal.  Neck:     Thyroid: No  thyromegaly.  Cardiovascular:     Rate and Rhythm: Normal rate and regular rhythm.  Pulmonary:     Effort: No respiratory distress.     Breath sounds: Normal breath sounds. No wheezing.  Abdominal:  General: Bowel sounds are normal.     Palpations: Abdomen is soft.     Tenderness: There is no abdominal tenderness.  Musculoskeletal:        General: No swelling or tenderness.     Cervical back: Neck supple. No tenderness.  Lymphadenopathy:     Cervical: No cervical adenopathy.  Skin:    Findings: No erythema or rash.  Neurological:     Mental Status: She is alert.  Psychiatric:        Mood and Affect: Mood normal.        Behavior: Behavior normal.      Outpatient Encounter Medications as of 05/29/2022  Medication Sig   aspirin EC 81 MG EC tablet Take 1 tablet (81 mg total) by mouth daily.   Calcium Carbonate-Vitamin D3 600-400 MG-UNIT TABS Take by mouth.   Magnesium 250 MG TABS Take by mouth.   Mavacamten 5 MG CAPS Take 5 mg by mouth daily.   metoprolol succinate (TOPROL XL) 50 MG 24 hr tablet Take 1.5 tablets (75 mg) by mouth once daily   Multiple Vitamin (MULTIVITAMIN) tablet Take 1 tablet by mouth daily.   sertraline (ZOLOFT) 50 MG tablet Take 1 tablet by mouth once daily   tamoxifen (NOLVADEX) 20 MG tablet Take 20 mg by mouth daily.   tobramycin-dexamethasone (TOBRADEX) ophthalmic ointment Place 1 Application into the left eye 2 (two) times daily.   Vitamin D, Cholecalciferol, 10 MCG (400 UNIT) CAPS Take by mouth.   [DISCONTINUED] atorvastatin (LIPITOR) 40 MG tablet Take 1 tablet by mouth once daily   atorvastatin (LIPITOR) 40 MG tablet Take 1 tablet (40 mg total) by mouth daily.   No facility-administered encounter medications on file as of 05/29/2022.     Lab Results  Component Value Date   WBC 8.3 02/22/2022   HGB 14.2 02/22/2022   HCT 41.8 02/22/2022   PLT 136.0 (L) 02/22/2022   GLUCOSE 94 05/24/2022   CHOL 98 05/24/2022   TRIG 112.0 05/24/2022   HDL 39.20  05/24/2022   LDLDIRECT 57 01/19/2020   LDLCALC 36 05/24/2022   ALT 35 05/24/2022   AST 24 05/24/2022   NA 142 05/24/2022   K 4.0 05/24/2022   CL 111 05/24/2022   CREATININE 0.84 05/24/2022   BUN 15 05/24/2022   CO2 25 05/24/2022   TSH 1.32 09/24/2021   INR 1.0 11/19/2019   HGBA1C 5.2 03/30/2013    US Pelvic Complete With Transvaginal  Result Date: 04/17/2022 CLINICAL DATA:  Abnormal uterus on CT, heterogeneously enhancing question fibroids, postmenopausal, history of tamoxifen use EXAM: TRANSABDOMINAL AND TRANSVAGINAL ULTRASOUND OF PELVIS TECHNIQUE: Both transabdominal and transvaginal ultrasound examinations of the pelvis were performed. Transabdominal technique was performed for global imaging of the pelvis including uterus, ovaries, adnexal regions, and pelvic cul-de-sac. It was necessary to proceed with endovaginal exam following the transabdominal exam to visualize the uterus, endometrium, and ovaries. COMPARISON:  None Available. FINDINGS: Uterus Measurements: 7.6 x 3.4 x 4.7 cm = volume: 65 mL. Anteverted. Heterogeneous myometrium. Multiple small nodular foci likely representing small leiomyomata. These include a 19 mm fundal leiomyoma subserosal, intramural leiomyoma anterior LEFT upper uterus 12 mm diameter, and a posterior intramural leiomyoma 15 mm diameter. Nabothian cysts at cervix. Endometrium Thickness: 9 mm. Mildly heterogeneous. Small endometrial cyst mid uterus 4 mm diameter. No discrete mass or fluid. Right ovary Measurements: 1.5 x 1.2 x 1.7 cm = volume: 2.7 mL. Normal morphology without mass Left ovary Not visualized, likely obscured by bowel Other findings  No free pelvic fluid or adnexal masses. IMPRESSION: Multiple small uterine leiomyomata. Nonvisualization of LEFT ovary. 9 mm thick endometrial complex; endometrial thickness is considered abnormal for an asymptomatic post-menopausal female and endometrial sampling should be considered to exclude carcinoma. (: Radiological  Reasoning: Algorithmic Workup of Abnormal. AJR. These results will be called to the ordering clinician or representative by the Radiologist Assistant, and communication documented in the PACS or [employer]. Electronically Signed   By: Lavonia Dana M.D.   On: 04/17/2022 17:28       Assessment & Plan:   Problem List Items Addressed This Visit     Coronary artery disease of native artery of native heart with stable angina pectoris [facility])    Recently evaluated by cardiology for chest pain with minimal exertion.  Recommended f/u CTA and cardiac monitor.  Monitor - Paroxysmal SVTs, 3 short runs of VT lasting 4 beats noted. Overall no significant/persistent arrhythmias. CTA - Stable 4 mm pulmonary nodule in the right lower lobe. This is stable since June 2021 and is likely benign. No follow-up imaging recommended. Coronary calcium score of 216.  Calcified plaque - proximal LAD mild stenosis - 35-49%.  Minimal stenosis - ostial RCA - mild non obstructive CAD.  Recommended continuing aspirin, lipitor and toprol.  MRCM - findings c/w hypertrophic obstructive cardiomyopathy.  Felt better with increase in toprol.  Saw Dr Gasper Sells for evaluation of hypertrophic cardiomyopathy - to assess - started mavacamten.  Feels better.  SOB has improved. F/u ECHO next week.       Relevant Medications   atorvastatin (LIPITOR) 40 MG tablet   DCIS (ductal carcinoma in situ)    S/p excision and XRT.  Followed by Dr Bary Castilla.  Last evaluated 08/14/21 - Birads II.   Recommended f/u in one year.       Diarrhea    Diarrhea - 1-2x/day.  GI pathogen panel negative.  Probiotic.  Has f/u in January with GI.       Endometrial thickening on ultrasound    Saw Dr Glennon Mac 04/30/22 - offered endometrial biopsy.  Elected f/u ultrasound in 3-6 months.       Essential hypertension    Continue metoprolol.  Blood pressure as outlined.  No changes in medication.  Follow pressures.  Follow metabolic panel.       Relevant  Medications   atorvastatin (LIPITOR) 40 MG tablet   Other Relevant Orders   Basic Metabolic Panel (BMET)   HOCM (hypertrophic obstructive cardiomyopathy) ([place])    Recent cardiac MRI with an EF of 73% and severe asymmetric left ventricular basal septal hypertrophy with transmural LGE suggestive of hypertrophic obstructive cardiomyopathy. Dr Gasper Sells - (04/11/22) - stress echo for inducible gradients; scheduled for 04/22/22. If she has a significant LVOT gradient (> 30 mm Hg) we will attempt mavacamten 5 mg. Per 04/2022 note - start mavacamten. Has started.  Feeling better. No as sob.  Planning for echo next week.       Relevant Medications   atorvastatin (LIPITOR) 40 MG tablet   Hyperlipidemia    Low cholesterol diet and exercise.  Continue lipitor.  Follow lipid panel and liver function tests.   Lab Results  Component Value Date   CHOL 98 05/24/2022   HDL 39.20 05/24/2022   LDLCALC 36 05/24/2022   LDLDIRECT 57 01/19/2020   TRIG 112.0 05/24/2022   CHOLHDL 2 05/24/2022       Relevant Medications   atorvastatin (LIPITOR) 40 MG tablet   Other Relevant Orders  Lipid Profile   TSH   Stress    Continues on zoloft.  Overall appears to be handling things well.  Does not feel needs any further intervention.  Follow.       Thrombocytopenia ([place])    Follow cbc.       Relevant Orders   CBC with Differential/Platelet   Other Visit Diagnoses     Abnormal liver function test    -  Primary   Relevant Orders   Hepatic function panel        Einar Pheasant, MD

## 2022-05-30 ENCOUNTER — Ambulatory Visit: Payer: Medicare HMO | Admitting: Radiation Oncology

## 2022-06-02 NOTE — Assessment & Plan Note (Signed)
Saw Dr Glennon Mac 04/30/22 - offered endometrial biopsy.  Elected f/u ultrasound in 3-6 months.

## 2022-06-02 NOTE — Assessment & Plan Note (Signed)
Continue metoprolol.  Blood pressure as outlined.  No changes in medication.  Follow pressures.  Follow metabolic panel.

## 2022-06-02 NOTE — Assessment & Plan Note (Signed)
S/p excision and XRT.  Followed by Dr Bary Castilla.  Last evaluated 08/14/21 - Birads II.   Recommended f/u in one year.

## 2022-06-02 NOTE — Assessment & Plan Note (Signed)
Recently evaluated by cardiology for chest pain with minimal exertion.  Recommended f/u CTA and cardiac monitor.  Monitor - Paroxysmal SVTs, 3 short runs of VT lasting 4 beats noted. Overall no significant/persistent arrhythmias. CTA - Stable 4 mm pulmonary nodule in the right lower lobe. This is stable since June 2021 and is likely benign. No follow-up imaging recommended. Coronary calcium score of 216.  Calcified plaque - proximal LAD mild stenosis - 35-49%.  Minimal stenosis - ostial RCA - mild non obstructive CAD.  Recommended continuing aspirin, lipitor and toprol.  MRCM - findings c/w hypertrophic obstructive cardiomyopathy.  Felt better with increase in toprol.  Saw Dr Gasper Sells for evaluation of hypertrophic cardiomyopathy - to assess - started mavacamten.  Feels better.  SOB has improved. F/u ECHO next week.

## 2022-06-02 NOTE — Assessment & Plan Note (Signed)
Recent cardiac MRI with an EF of 73% and severe asymmetric left ventricular basal septal hypertrophy with transmural LGE suggestive of hypertrophic obstructive cardiomyopathy. Dr Gasper Sells - (04/11/22) - stress echo for inducible gradients; scheduled for 04/22/22. If she has a significant LVOT gradient (> 30 mm Hg) we will attempt mavacamten 5 mg. Per 04/2022 note - start mavacamten. Has started.  Feeling better. No as sob.  Planning for echo next week.

## 2022-06-02 NOTE — Assessment & Plan Note (Signed)
Continues on zoloft.  Overall appears to be handling things well.  Does not feel needs any further intervention.  Follow.

## 2022-06-02 NOTE — Assessment & Plan Note (Signed)
Low cholesterol diet and exercise.  Continue lipitor.  Follow lipid panel and liver function tests.   Lab Results  Component Value Date   CHOL 98 05/24/2022   HDL 39.20 05/24/2022   LDLCALC 36 05/24/2022   LDLDIRECT 57 01/19/2020   TRIG 112.0 05/24/2022   CHOLHDL 2 05/24/2022

## 2022-06-02 NOTE — Assessment & Plan Note (Signed)
Diarrhea - 1-2x/day.  GI pathogen panel negative.  Probiotic.  Has f/u in January with GI.

## 2022-06-02 NOTE — Assessment & Plan Note (Signed)
Follow cbc.  

## 2022-06-04 ENCOUNTER — Telehealth: Payer: Self-pay | Admitting: Internal Medicine

## 2022-06-04 NOTE — Telephone Encounter (Signed)
Called [facility] pharmacy as requested. Gave a verbal order for Camzyos 5 mg PO QD #30 with 1 refill.  Advised that pt is scheduled to have Echo on 06/05/22 if order changes will call back. No further needs.

## 2022-06-04 NOTE — Telephone Encounter (Signed)
Follow Up:    Please call, needs clarification on patient's Mavacamten prescription.

## 2022-06-05 ENCOUNTER — Other Ambulatory Visit (HOSPITAL_COMMUNITY): Payer: Medicare HMO

## 2022-06-07 ENCOUNTER — Other Ambulatory Visit: Payer: Self-pay

## 2022-06-07 ENCOUNTER — Telehealth: Payer: Self-pay | Admitting: Internal Medicine

## 2022-06-07 DIAGNOSIS — I421 Obstructive hypertrophic cardiomyopathy: Secondary | ICD-10-CM

## 2022-06-07 NOTE — Telephone Encounter (Signed)
Pt c/o medication issue:  1. Name of Medication: Camzyos   2. How are you currently taking this medication (dosage and times per day)?   3. Are you having a reaction (difficulty breathing--STAT)?   4. What is your medication issue? Edwena Felty from [employer] calling because pt cancelled her echo due to having COVID. She states the pt only has 5 pills left.

## 2022-06-07 NOTE — Telephone Encounter (Signed)
Spoke with patient who states she canceled ECHO due to testing positive with Covid on 06/04/22 and she is symptomatic with congestion, fever and cough.  Advised to reschedule ECHO as she cannot have refills on Camzyos until her ECHO is completed. Advised office would be reaching out to reschedule and that Dr Gasper Sells is aware she had to reschedule.  Patient verbalized understanding and did not have any questions.

## 2022-06-13 ENCOUNTER — Ambulatory Visit
Admission: RE | Admit: 2022-06-13 | Discharge: 2022-06-13 | Disposition: A | Discharge status: Home / Self Care | Payer: Medicare HMO | Source: Ambulatory Visit | Attending: Radiation Oncology | Admitting: Radiation Oncology

## 2022-06-13 ENCOUNTER — Encounter: Payer: Self-pay | Admitting: Radiation Oncology

## 2022-06-13 VITALS — BP 138/87 | HR 74 | Temp 98.0°F | Resp 16 | Wt 168.0 lb

## 2022-06-13 DIAGNOSIS — D0511 Intraductal carcinoma in situ of right breast: Secondary | ICD-10-CM | POA: Insufficient documentation

## 2022-06-13 DIAGNOSIS — Z170 Estrogen receptor positive status [ER+]: Secondary | ICD-10-CM | POA: Insufficient documentation

## 2022-06-13 DIAGNOSIS — Z79810 Long term (current) use of selective estrogen receptor modulators (SERMs): Secondary | ICD-10-CM | POA: Diagnosis not present

## 2022-06-13 DIAGNOSIS — D0512 Intraductal carcinoma in situ of left breast: Secondary | ICD-10-CM | POA: Diagnosis not present

## 2022-06-13 DIAGNOSIS — Z923 Personal history of irradiation: Secondary | ICD-10-CM | POA: Diagnosis not present

## 2022-06-13 NOTE — Progress Notes (Signed)
Radiation Oncology Follow up Note  Name: Mary Walter   Date:   06/13/2022 MRN:  [id number] DOB: 1955/11/16    This 66 y.o. female presents to the clinic today for 2-year follow-up status post whole breast radiation to her right breast for ER positive ductal carcinoma in situ.  REFERRING PROVIDER: Einar Pheasant, MD  HPI: Patient is a 66 year old female now out 2 years having completed whole breast radiation to her right breast for ER positive ductal carcinoma in situ.  Seen today in routine follow-up she is doing well.  She specifically denies breast tenderness cough or bone pain..  She is currently on tamoxifen tolerating it well without side effect except for possible hair growth.  She had a CT scan of her abdomen and pelvis for chronic diarrhea which has resolved.  Those findings were negative.  She mammograms back in February which were BI-RADS 2 benign.  Scheduled for further mammograms this February.  COMPLICATIONS OF TREATMENT: none  FOLLOW UP COMPLIANCE: keeps appointments   PHYSICAL EXAM:  BP 138/87 (BP Location: Right Arm, Patient Position: Sitting, Cuff Size: Normal)   Pulse 74   Temp 98 F (36.7 C) (Tympanic)   Resp 16   Wt 168 lb (76.2 kg)   LMP 06/24/2010   BMI 27.96 kg/m  Lungs are clear to A&P cardiac examination essentially unremarkable with regular rate and rhythm. No dominant mass or nodularity is noted in either breast in 2 positions examined. Incision is well-healed. No axillary or supraclavicular adenopathy is appreciated. Cosmetic result is excellent.  Well-developed well-nourished patient in NAD. HEENT reveals PERLA, EOMI, discs not visualized.  Oral cavity is clear. No oral mucosal lesions are identified. Neck is clear without evidence of cervical or supraclavicular adenopathy. Lungs are clear to A&P. Cardiac examination is essentially unremarkable with regular rate and rhythm without murmur rub or thrill. Abdomen is benign with no organomegaly or masses  noted. Motor sensory and DTR levels are equal and symmetric in the upper and lower extremities. Cranial nerves II through XII are grossly intact. Proprioception is intact. No peripheral adenopathy or edema is identified. No motor or sensory levels are noted. Crude visual fields are within normal range.  RADIOLOGY RESULTS: CT scans mammograms reviewed compatible with above-stated findings  PLAN: Present time patient is doing well with no evidence of disease now out 2 years from whole breast radiation and pleased with her overall progress.  Of asked to see her back in 1 year and then will discontinue follow-up care.  She has mammograms in February which I will review when they are available.  Patient is to call with any concerns.  She continues on tamoxifen without side effect.  I would like to take this opportunity to thank you for allowing me to participate in the care of your patient.Noreene Filbert, MD

## 2022-06-14 ENCOUNTER — Ambulatory Visit (HOSPITAL_COMMUNITY)
Admission: RE | Admit: 2022-06-14 | Discharge: 2022-06-14 | Disposition: A | Discharge status: Home / Self Care | Payer: Medicare HMO | Source: Ambulatory Visit | Attending: Internal Medicine | Admitting: Internal Medicine

## 2022-06-14 ENCOUNTER — Telehealth: Payer: Self-pay | Admitting: Internal Medicine

## 2022-06-14 DIAGNOSIS — R011 Cardiac murmur, unspecified: Secondary | ICD-10-CM | POA: Diagnosis not present

## 2022-06-14 DIAGNOSIS — R0600 Dyspnea, unspecified: Secondary | ICD-10-CM | POA: Diagnosis not present

## 2022-06-14 DIAGNOSIS — I421 Obstructive hypertrophic cardiomyopathy: Secondary | ICD-10-CM

## 2022-06-14 DIAGNOSIS — I2510 Atherosclerotic heart disease of native coronary artery without angina pectoris: Secondary | ICD-10-CM | POA: Diagnosis not present

## 2022-06-14 DIAGNOSIS — R079 Chest pain, unspecified: Secondary | ICD-10-CM | POA: Insufficient documentation

## 2022-06-14 DIAGNOSIS — I119 Hypertensive heart disease without heart failure: Secondary | ICD-10-CM | POA: Diagnosis not present

## 2022-06-14 DIAGNOSIS — I4510 Unspecified right bundle-branch block: Secondary | ICD-10-CM | POA: Diagnosis not present

## 2022-06-14 DIAGNOSIS — I422 Other hypertrophic cardiomyopathy: Secondary | ICD-10-CM | POA: Insufficient documentation

## 2022-06-14 LAB — ECHOCARDIOGRAM LIMITED
AR max vel: 3.35 cm2
AV Peak grad: 13.7 mmHg
Ao pk vel: 1.85 m/s
Area-P 1/2: 3.21 cm2
S' Lateral: 2.4 cm

## 2022-06-14 NOTE — Progress Notes (Signed)
  Echocardiogram 2D Echocardiogram has been performed.  Mary Walter 06/14/2022, 9:44 AM

## 2022-06-14 NOTE — Telephone Encounter (Signed)
Called Patient in regard to echo findings:  Her LVEF is 71% Her LVOT gradient is 12 mm Hg (from 79 mm Hg on stress test)  Prior to getting COVID-19 she had improvement of her exertional CP, SOB.  She was able to do more without DOE.  No syncope.  She has changed no new medications or supplements  Plan  - Mavacamten 2.5 mg PO Daily (Per protocol); needs updating in portal - Her 07/03/2022 echo will need to be a stress echocardiogram (consented, attestation order signed)    Patient had no further questions.  Rudean Haskell, MD [facility] [place], [id number], [place] [address] [phone]  12:51 PM

## 2022-06-14 NOTE — Telephone Encounter (Signed)
Called to ask that the patient portal be update for echo a patient. Please advise

## 2022-06-14 NOTE — Telephone Encounter (Signed)
Echo was completed today however not read by MD at this time.  Pt will be contacted with results once this has been completed.    Contacted [facility] at # listed.   Rep states they need a PSF but is unsure what that means.  She is contacting the pharmacy department there ([phone]) to determine need.   Valerie with [facility] states they were calling because they are aware echo had been completed today and they need the Pt Safety Form in the [facility] portal to be updated.  Advised echo has not been read by MD at this time.

## 2022-06-18 ENCOUNTER — Telehealth: Payer: Self-pay | Admitting: Internal Medicine

## 2022-06-18 NOTE — Telephone Encounter (Signed)
*  STAT* If patient is at the pharmacy, call can be transferred to refill team.   1. Which medications need to be refilled? (please list name of each medication and dose if known) Mavacamten 2.5 mg   2. Which pharmacy/location (including street and city if local pharmacy) is medication to be sent to? [facility] by [place], [place]   3. Do they need a 30 day or 90 day supply? 30 day supply   Pharmacy has not received prescription

## 2022-06-19 MED ORDER — MAVACAMTEN 2.5 MG PO CAPS: 2.5000 mg | ORAL_CAPSULE | Freq: Every day | ORAL | 2 refills | Status: DC

## 2022-06-19 NOTE — Telephone Encounter (Signed)
Called pt advised that PSF completed on [employer] portal.  Also, informed pt that 07/03/22 Echo will be canceled pt will need a stress echo around 07/08/22.  Echo for 1/10 cancelled. Script for mavacamten 2.5 mg PO QD sent to [facility].  Also, called pharmacy to ensure medication is expedited to pt; d/t zero in pt possession.

## 2022-06-21 NOTE — Telephone Encounter (Signed)
Called pt advised of stress echo appointment on 07/05/21 at 2:30 pm.  Pt reports took 1st dose of Camzyos 2.5 mg  today.  Had no questions or concerns.

## 2022-06-28 ENCOUNTER — Other Ambulatory Visit: Payer: Self-pay | Admitting: Surgery

## 2022-06-28 DIAGNOSIS — Z853 Personal history of malignant neoplasm of breast: Secondary | ICD-10-CM

## 2022-06-28 NOTE — Telephone Encounter (Signed)
LDM on voice message machine per DPR regarding her Stress Echo on 07/05/22 at 2:30. Patient was reminded to wear sneakers for the study.

## 2022-07-02 ENCOUNTER — Telehealth: Payer: Self-pay | Admitting: Internal Medicine

## 2022-07-02 NOTE — Telephone Encounter (Signed)
When I go to do PA I receive this message in [facility]  The patient currently has access to the requested medication and a Prior Authorization is not needed for the patient/medication

## 2022-07-02 NOTE — Telephone Encounter (Signed)
Cover [employer] Meds called about prior auth renewal for Camzyos.  They can be reached at [phone] option 3.

## 2022-07-03 ENCOUNTER — Other Ambulatory Visit (HOSPITAL_COMMUNITY): Payer: Medicare HMO

## 2022-07-05 ENCOUNTER — Ambulatory Visit (HOSPITAL_COMMUNITY): Payer: Medicare HMO | Attending: Internal Medicine

## 2022-07-05 ENCOUNTER — Ambulatory Visit (HOSPITAL_BASED_OUTPATIENT_CLINIC_OR_DEPARTMENT_OTHER): Payer: Medicare HMO

## 2022-07-05 DIAGNOSIS — I421 Obstructive hypertrophic cardiomyopathy: Secondary | ICD-10-CM | POA: Diagnosis not present

## 2022-07-05 LAB — ECHOCARDIOGRAM STRESS TEST: S' Lateral: 2.3 cm

## 2022-07-24 ENCOUNTER — Other Ambulatory Visit: Payer: Self-pay

## 2022-07-24 DIAGNOSIS — Z79810 Long term (current) use of selective estrogen receptor modulators (SERMs): Secondary | ICD-10-CM | POA: Diagnosis not present

## 2022-07-24 DIAGNOSIS — K760 Fatty (change of) liver, not elsewhere classified: Secondary | ICD-10-CM

## 2022-07-24 DIAGNOSIS — D696 Thrombocytopenia, unspecified: Secondary | ICD-10-CM | POA: Diagnosis not present

## 2022-07-24 DIAGNOSIS — Z83719 Family history of colon polyps, unspecified: Secondary | ICD-10-CM | POA: Diagnosis not present

## 2022-07-24 DIAGNOSIS — R198 Other specified symptoms and signs involving the digestive system and abdomen: Secondary | ICD-10-CM | POA: Diagnosis not present

## 2022-07-31 ENCOUNTER — Ambulatory Visit (HOSPITAL_COMMUNITY): Payer: Medicare HMO | Attending: Internal Medicine

## 2022-07-31 DIAGNOSIS — I421 Obstructive hypertrophic cardiomyopathy: Secondary | ICD-10-CM | POA: Diagnosis not present

## 2022-07-31 LAB — ECHOCARDIOGRAM LIMITED
Area-P 1/2: 3.42 cm2
S' Lateral: 2.4 cm

## 2022-08-01 ENCOUNTER — Ambulatory Visit
Admission: RE | Admit: 2022-08-01 | Discharge: 2022-08-01 | Disposition: A | Discharge status: Home / Self Care | Payer: Medicare HMO | Source: Ambulatory Visit | Attending: General Surgery | Admitting: General Surgery

## 2022-08-01 DIAGNOSIS — Z79811 Long term (current) use of aromatase inhibitors: Secondary | ICD-10-CM | POA: Diagnosis not present

## 2022-08-01 DIAGNOSIS — C50919 Malignant neoplasm of unspecified site of unspecified female breast: Secondary | ICD-10-CM | POA: Insufficient documentation

## 2022-08-01 DIAGNOSIS — M8589 Other specified disorders of bone density and structure, multiple sites: Secondary | ICD-10-CM | POA: Insufficient documentation

## 2022-08-01 DIAGNOSIS — Z13820 Encounter for screening for osteoporosis: Secondary | ICD-10-CM | POA: Insufficient documentation

## 2022-08-01 DIAGNOSIS — Z923 Personal history of irradiation: Secondary | ICD-10-CM | POA: Insufficient documentation

## 2022-08-02 ENCOUNTER — Telehealth: Payer: Self-pay

## 2022-08-02 DIAGNOSIS — I421 Obstructive hypertrophic cardiomyopathy: Secondary | ICD-10-CM

## 2022-08-02 MED ORDER — MAVACAMTEN 2.5 MG PO CAPS: 2.5000 mg | ORAL_CAPSULE | Freq: Every day | ORAL | 2 refills | Status: DC

## 2022-08-02 NOTE — Telephone Encounter (Signed)
-----   Message from Werner Lean, MD sent at 07/31/2022  5:33 PM EST ----- Results: Peak LVOT Gradient: 9 mm Hg LVEF : 65% Plan: Mavacamten dose 2.5   If Patient has new medications or supplements, will need medication review for potential interactions  12 week echo  Werner Lean, MD

## 2022-08-02 NOTE — Telephone Encounter (Signed)
The patient has been notified of the result and verbalized understanding.  All questions (if any) were answered. Jaterrius Ricketson N Adilyn Humes, RN 08/02/2022 11:23 AM   Pt has not started any new medications.  Reports forgot to take medication 1 day and had chest discomfort.  Pt took medication no further discomfort noted.  Advised pt if symptoms persist to notify our office. Scheduler to set pt up for 12 week echo.

## 2022-08-06 ENCOUNTER — Ambulatory Visit
Admission: RE | Admit: 2022-08-06 | Discharge: 2022-08-06 | Disposition: A | Discharge status: Home / Self Care | Payer: Medicare HMO | Source: Ambulatory Visit | Attending: Nurse Practitioner | Admitting: Nurse Practitioner

## 2022-08-06 DIAGNOSIS — D696 Thrombocytopenia, unspecified: Secondary | ICD-10-CM | POA: Insufficient documentation

## 2022-08-06 DIAGNOSIS — K760 Fatty (change of) liver, not elsewhere classified: Secondary | ICD-10-CM

## 2022-08-06 DIAGNOSIS — Z79810 Long term (current) use of selective estrogen receptor modulators (SERMs): Secondary | ICD-10-CM | POA: Diagnosis not present

## 2022-08-06 DIAGNOSIS — R198 Other specified symptoms and signs involving the digestive system and abdomen: Secondary | ICD-10-CM | POA: Insufficient documentation

## 2022-08-15 ENCOUNTER — Ambulatory Visit
Admission: RE | Admit: 2022-08-15 | Discharge: 2022-08-15 | Disposition: A | Discharge status: Home / Self Care | Payer: Medicare HMO | Source: Ambulatory Visit | Attending: Surgery | Admitting: Surgery

## 2022-08-15 DIAGNOSIS — Z853 Personal history of malignant neoplasm of breast: Secondary | ICD-10-CM | POA: Diagnosis not present

## 2022-08-20 ENCOUNTER — Ambulatory Visit (INDEPENDENT_AMBULATORY_CARE_PROVIDER_SITE_OTHER): Payer: Medicare HMO

## 2022-08-20 ENCOUNTER — Ambulatory Visit: Payer: Medicare HMO | Attending: Internal Medicine | Admitting: Internal Medicine

## 2022-08-20 ENCOUNTER — Encounter: Payer: Self-pay | Admitting: Internal Medicine

## 2022-08-20 VITALS — BP 118/68 | HR 75 | Ht 65.0 in | Wt 174.0 lb

## 2022-08-20 DIAGNOSIS — I421 Obstructive hypertrophic cardiomyopathy: Secondary | ICD-10-CM | POA: Diagnosis not present

## 2022-08-20 DIAGNOSIS — R002 Palpitations: Secondary | ICD-10-CM

## 2022-08-20 MED ORDER — METOPROLOL SUCCINATE ER 100 MG PO TB24: 100.0000 mg | ORAL_TABLET | Freq: Every day | ORAL | 3 refills | Status: DC

## 2022-08-20 NOTE — Patient Instructions (Addendum)
Medication Instructions:  Your physician has recommended you make the following change in your medication:   1) INCREASE metoprolol succinate (Toprol XL) to '100mg'$  daily  *If you need a refill on your cardiac medications before your next appointment, please call your pharmacy*  Lab Work: None  Testing/Procedures: Your physician has requested that you have a stress echocardiogram for May 2024. For further information please visit [email or web address]. Please follow instruction sheet as given.   Your physician has requested that you wear a Zio heart monitor for 7 days. This will be mailed to your home with instructions on how to apply the monitor and how to return it when finished. Please allow 2 weeks after returning the heart monitor before our office calls you with the results.   Follow-Up: At [facility], you and your health needs are our priority.  As part of our continuing mission to provide you with exceptional heart care, we have created designated Provider Care Teams.  These Care Teams include your primary Cardiologist (physician) and Advanced Practice Providers (APPs -  Physician Assistants and Nurse Practitioners) who all work together to provide you with the care you need, when you need it.  Your next appointment:   6 month(s)  Provider:   Rudean Haskell, MD  Other Instructions You have been referred to a genetic counselor, Lattie Corns. A scheduler will call you to schedule an appointment to meet with them.  ZIO XT- Long Term Monitor Instructions     Your physician has requested you wear a ZIO patch monitor for 7 days.  This is a single patch monitor. [employer] supplies one patch monitor per enrollment. Additional  stickers are not available. Please do not apply patch if you will be having a Nuclear Stress Test,  Echocardiogram, Cardiac CT, MRI, or Chest Xray during the period you would be wearing the  monitor. The patch cannot be worn during these tests. You  cannot remove and re-apply the  ZIO XT patch monitor.  Your ZIO patch monitor will be mailed 3 day USPS to your address on file. It may take 3-5 days  to receive your monitor after you have been enrolled.  Once you have received your monitor, please review the enclosed instructions. Your monitor  has already been registered assigning a specific monitor serial # to you.     Billing and Patient Assistance Program Information     We have supplied [employer] with any of your insurance information on file for billing purposes.  [employer] offers a sliding scale Patient Assistance Program for patients that do not have  insurance, or whose insurance does not completely cover the cost of the ZIO monitor.  You must apply for the Patient Assistance Program to qualify for this discounted rate.  To apply, please call [employer] at [phone], select option 4, select option 2, ask to apply for  Patient Assistance Program. Theodore Demark will ask your household income, and how many people  are in your household. They will quote your out-of-pocket cost based on that information.  [employer] will also be able to set up a 67-month interest-free payment plan if needed.     Applying the monitor     Shave hair from upper left chest.  Hold abrader disc by orange tab. Rub abrader in 40 strokes over the upper left chest as  indicated in your monitor instructions.  Clean area with 4 enclosed alcohol pads. Let dry.  Apply patch as indicated in monitor instructions. Patch will be placed under collarbone  on left  side of chest with arrow pointing upward.  Rub patch adhesive wings for 2 minutes. Remove white label marked "1". Remove the white  label marked "2". Rub patch adhesive wings for 2 additional minutes.  While looking in a mirror, press and release button in center of patch. A small green light will  flash 3-4 times. This will be your only indicator that the monitor has been turned on.  Do not shower for the first 24  hours. You may shower after the first 24 hours.  Press the button if you feel a symptom. You will hear a small click. Record Date, Time and  Symptom in the Patient Logbook.  When you are ready to remove the patch, follow instructions on the last 2 pages of Patient  Logbook. Stick patch monitor onto the last page of Patient Logbook.  Place Patient Logbook in the blue and white box. Use locking tab on box and tape box closed  securely. The blue and white box has prepaid postage on it. Please place it in the mailbox as  soon as possible. Your physician should have your test results approximately 7 days after the  monitor has been mailed back to [facility].  Call [facility] at [phone] if you have questions regarding  your ZIO XT patch monitor. Call them immediately if you see an orange light blinking on your  monitor.  If your monitor falls off in less than 4 days, contact our Monitor department at [phone].  If your monitor becomes loose or falls off after 4 days call [employer] at [phone] for  suggestions on securing your monitor.

## 2022-08-20 NOTE — Progress Notes (Signed)
Cardiology Office Note:    Date:  08/20/2022   ID:  Mary Walter, DOB 03-17-1956, MRN [id number]  PCP:  Mary Walter, [place] Prov[facility] Cardiologist:  Mary Rogue, MD     Referring MD: Mary Pheasant, MD   CC: [facility] Consulted for the evaluation of CMI discussion at the behest of [place]  History of Present Illness:    Mary Walter is a 67 y.o. female with a hx of HCM who presents for evaluation peak gradient in 2021 was 26 mm Hg.  Patient notes improved SOB at rest and  DOE She still have some DOE with walking but this has improved; when she picks up trash near her house gets some breathing issues Notes improved fatigue. Notes rare palpitations Notes atypical CP- it is improved by heating pad.. Notes no Dizziness. Notes no syncope. Notable family events include her father having HCM (no SCD). He was seen by [facility].   Past Medical History:  Diagnosis Date   Breast cancer ([place])    Chest pain    Dyspnea    Family history of adverse reaction to anesthesia    SON-LUNGS FILLED UP WITH FLUID AFTER APPENDECTOMY   Hematuria    HOCM (hypertrophic obstructive cardiomyopathy) ([place])    a. 10/2019 Echo: EF 70-75%, mod LVH. No rwma. Mild SAM, mild LVOT obs(peak grad 20 @ rest 26 w/ Valsalva); b. 01/2022 cMRI: EF 73%, sev asymm LV basal septal hypertrophy- basal segment 1.6cm. Transmural LGE/scar in LV basal segment (point of max thickness & RV insertion). SAM->mild MR & LVOT obs-->HOCM.   Hypercholesterolemia    Hypertension    Nonobstructive CAD (coronary artery disease)    a.10/2015 MV: No ischemia/infarct; b. 11/2019 Cor CTA: mild nonobs LAD dzs; c. 12/2021 Cor CTA: Ca2+ = 216 (87th%'ile). LM nl, LAD 25-49p, LCX nondom, nl, RCA <25ost.   Palpitations    a. 12/2021 Zio: RSR @ 72 (52-182). 3 runs NSVT (longest 5 beats, fastest 152 x 4 beats). 10 SVT runs (longest 15.1 secs @ 103, fastest 4 beats @ 182). Rare PACs/PVCs.   Personal history of radiation  therapy    Pulmonary nodule    a. 12/2021 CT chest: stable 106m RLL pulm nodule, likely benign, No f/u imaging req.   Systolic murmur     Past Surgical History:  Procedure Laterality Date   BREAST BIOPSY Left 10/27/2019   Affirm bx-distortion-     clip-path ADH   BREAST BIOPSY Left 01/07/2020   Affim bx 1 area/ x clip and ribbon clip/ ADH   BREAST LUMPECTOMY     BREAST LUMPECTOMY WITH NEEDLE LOCALIZATION Left 02/14/2020   Procedure: BREAST LUMPECTOMY WITH NEEDLE LOCALIZATION;  Surgeon: BRobert Bellow MD;  Location: [facility] ORS;  Service: General;  Laterality: Left;   CHOLECYSTECTOMY  10/2020   FACIAL FRACTURE SURGERY     X 2    Current Medications: Current Meds  Medication Sig   aspirin EC 81 MG EC tablet Take 1 tablet (81 mg total) by mouth daily.   atorvastatin (LIPITOR) 40 MG tablet Take 1 tablet (40 mg total) by mouth daily.   CALCIUM-MAGNESIUM-ZINC PO Take by mouth daily at 6 (six) AM.   mavacamten 2.5 MG CAPS Take 2.5 mg by mouth daily.   metoprolol succinate (TOPROL-XL) 100 MG 24 hr tablet Take 1 tablet (100 mg total) by mouth daily. Take with or immediately following a meal.   Multiple Vitamin (MULTIVITAMIN) tablet Take 1 tablet by mouth daily.  sertraline (ZOLOFT) 50 MG tablet Take 1 tablet by mouth once daily   tamoxifen (NOLVADEX) 20 MG tablet Take 20 mg by mouth daily.   tobramycin-dexamethasone (TOBRADEX) ophthalmic ointment Place 1 Application into the left eye 2 (two) times daily.   Vitamin D, Cholecalciferol, 10 MCG (400 UNIT) CAPS Take by mouth.   [DISCONTINUED] metoprolol succinate (TOPROL XL) 50 MG 24 hr tablet Take 1.5 tablets (75 mg) by mouth once daily     Allergies:   Macrobid [nitrofurantoin macrocrystal] and Penicillins   Social History   Socioeconomic History   Marital status: Married    Spouse name: Not on file   Number of children: 2   Years of education: Not on file   Highest education level: Not on file  Occupational History   Not on file   Tobacco Use   Smoking status: Never   Smokeless tobacco: Never  Vaping Use   Vaping Use: Never used  Substance and Sexual Activity   Alcohol use: No    Alcohol/week: 0.0 standard drinks of alcohol   Drug use: No   Sexual activity: Not Currently  Other Topics Concern   Not on file  Social History Narrative   Not on file   Social Determinants of Health   Financial Resource Strain: Low Risk  (02/01/2022)   Overall Financial Resource Strain (CARDIA)    Difficulty of Paying Living Expenses: Not hard at all  Food Insecurity: No Food Insecurity (02/01/2022)   Hunger Vital Sign    Worried About Running Out of Food in the Last Year: Never true    Ran Out of Food in the Last Year: Never true  Transportation Needs: No Transportation Needs (02/01/2022)   PRAPARE - Hydrologist (Medical): No    Lack of Transportation (Non-Medical): No  Physical Activity: Not on file  Stress: No Stress Concern Present (02/01/2022)   [place]    Feeling of Stress : Not at all  Social Connections: Not on file     Family History: The patient's family history includes Breast cancer in her cousin and sister; CVA in her father and paternal grandmother; Cancer in her father; Diabetes in her mother; Diverticulitis in her father; Heart attack in her brother; Heart disease in her father and mother; Heart murmur in her father; Hypertension in her father and mother; Stroke in her father.  ROS:   Please see the history of present illness.     All other systems reviewed and are negative.  EKGs/Labs/Other Studies Reviewed:    The following studies were reviewed today:  Cardiac Studies & Procedures     STRESS TESTS  ECHOCARDIOGRAM STRESS TEST 07/05/2022  Narrative EXERCISE STRESS ECHO REPORT   --------------------------------------------------------------------------------  Patient Name:   Mary Walter Date  of Exam: 07/05/2022 Medical Rec #:  [id number]          Height:       65.0 in Accession #:    [id number]         Weight:       168.0 lb Date of Birth:  Apr 20, 1956          BSA:          1.837 m Patient Age:    82 years           BP:           156/86 mmHg Patient Gender: F  HR:           65 bpm. Exam Location:  [place]  Procedure: 2D Echo and Limited Color Doppler  Indications:    I42.1 HOCM  History:        Patient has prior history of Echocardiogram examinations, most recent 06/14/2022. HOCM. Murmur. Palpitations. Chest pain. Dyspnea.  Sonographer:    Cresenciano Lick RDCS Referring Phys: [id number] [facility] A Jourdyn Hasler   Sonographer Comments: Stress Echo for HOCM  IMPRESSIONS   1. This is a negative stress echocardiogram for ischemia. 2. This is a low risk study.  Conclusion(s)/Recommendation(s): Pre-exercise images show EF 65-70% with chordal SAM. Peak resting gradient 8 mmHg/Valsalva gradient 12 mmHg before stress. Post exercise peak gradient 23 mmHg. No significant change in MR (mild) with stress. Pre stress RVSP 21 mmHg, post stress RVSP 29 mmHg. Occasional PACs and PVCs seen in recovery. FINDINGS  Exam Protocol: The patient exercised on a treadmill according to a Bruce protocol.   Patient Performance: The patient exercised for 6 minutes achieving 7 METS. The maximum stage achieved was II of the Bruce protocol. The baseline heart rate was 71 bpm. The heart rate at peak stress was 134 bpm. The target heart rate was calculated to be 130 bpm. The percentage of maximum predicted heart rate achieved was 87.9 %. The baseline blood pressure was 156/86 mmHg. The blood pressure at peak stress was 217/84 mmHg. The blood pressure response was hypertensive. The patient developed fatigue during the stress exam.  EKG: Resting EKG showed normal sinus rhythm with left ventricular hypertrophy. The patient developed baseline abnormalities during exercise.   2D  Echo Findings: The baseline ejection fraction was 65-70%. Baseline regional wall motion abnormalities were not present. There were no stress-induced wall motion abnormalities. This is a negative stress echocardiogram for ischemia.   Buford Dresser MD Electronically signed on 07/05/2022 at 5:55:06 PM      Final   ECHOCARDIOGRAM  ECHOCARDIOGRAM LIMITED 07/31/2022  Narrative ECHOCARDIOGRAM LIMITED REPORT    Patient Name:   Mary Walter Date of Exam: 07/31/2022 Medical Rec #:  [id number]          Height:       65.0 in Accession #:    [id number]         Weight:       168.0 lb Date of Birth:  1955-07-26          BSA:          1.837 m Patient Age:    29 years           BP:           128/78 mmHg Patient Gender: F                  HR:           60 bpm. Exam Location:  [place]  Procedure: 2D Echo, Limited Echo, Limited Color Doppler and Cardiac Doppler  Indications:    I42.1 HOCM [place] evaluation  History:        Patient has prior history of Echocardiogram examinations, most recent 07/05/2022. CAD, Signs/Symptoms:Chest Pain, Murmur and Dyspnea; Risk Factors:Hypertension and Dyslipidemia. Palpitations. HOCM.  Sonographer:    Cresenciano Lick RDCS Referring Phys: [id number] [facility] A Hydie Langan  IMPRESSIONS   1. Findings consistent with hypertrophic obstructive cardiomyopathy. Peak velocity 0.95 m/s. Peak gradient. Compared with the echo 05/2022, outflow obstruction has decreased. Left ventricular ejection fraction, by estimation, is 65 to 70%. The left ventricle has  normal function. The left ventricle has no regional wall motion abnormalities. There is moderate left ventricular hypertrophy. Left ventricular diastolic parameters are consistent with Grade II diastolic dysfunction (pseudonormalization). Elevated left ventricular end-diastolic pressure. 2. Right ventricular systolic function is normal. The right ventricular size is normal. There is normal pulmonary  artery systolic pressure. 3. Systolic anterior motion (SAM) of the mitral valve. The mitral valve is normal in structure. Mild mitral valve regurgitation. No evidence of mitral stenosis.  FINDINGS Left Ventricle: Findings consistent with hypertrophic obstructive cardiomyopathy. Peak velocity 0.95 m/s. Peak gradient. Compared with the echo 05/2022, outflow obstruction has decreased. Left ventricular ejection fraction, by estimation, is 65 to 70%. The left ventricle has normal function. The left ventricle has no regional wall motion abnormalities. The left ventricular internal cavity size was normal in size. There is moderate left ventricular hypertrophy. Left ventricular diastolic parameters are consistent with Grade II diastolic dysfunction (pseudonormalization). Elevated left ventricular end-diastolic pressure.  Right Ventricle: The right ventricular size is normal. No increase in right ventricular wall thickness. Right ventricular systolic function is normal. There is normal pulmonary artery systolic pressure. The tricuspid regurgitant velocity is 2.42 m/s, and with an assumed right atrial pressure of 3 mmHg, the estimated right ventricular systolic pressure is 99991111 mmHg.  Mitral Valve: Systolic anterior motion (SAM) of the mitral valve. The mitral valve is normal in structure. Mild mitral valve regurgitation. No evidence of mitral valve stenosis.  Tricuspid Valve: The tricuspid valve is normal in structure. Tricuspid valve regurgitation is mild . No evidence of tricuspid stenosis.  Pulmonic Valve: The pulmonic valve was normal in structure. Pulmonic valve regurgitation is not visualized. No evidence of pulmonic stenosis.  LEFT VENTRICLE PLAX 2D LVIDd:         3.80 cm   Diastology LVIDs:         2.40 cm   LV e' medial:    6.31 cm/s LV PW:         1.40 cm   LV E/e' medial:  16.7 LV IVS:        1.60 cm   LV e' lateral:   8.38 cm/s LVOT diam:     2.00 cm   LV E/e' lateral: 12.6 LV SV:          100 LV SV Index:   54 LVOT Area:     3.14 cm   RIGHT VENTRICLE             IVC RV S prime:     11.00 cm/s  IVC diam: 0.90 cm  LEFT ATRIUM         Index LA diam:    4.40 cm 2.40 cm/m AORTIC VALVE LVOT Vmax:   140.00 cm/s LVOT Vmean:  89.200 cm/s LVOT VTI:    0.318 m  AORTA Ao Root diam: 3.60 cm Ao Asc diam:  3.40 cm  MITRAL VALVE                TRICUSPID VALVE MV Area (PHT): 3.42 cm     TR Peak grad:   23.4 mmHg MV Decel Time: 222 msec     TR Vmax:        242.00 cm/s MV E velocity: 105.50 cm/s MV A velocity: 95.10 cm/s   SHUNTS MV E/A ratio:  1.11         Systemic VTI:  0.32 m Systemic Diam: 2.00 cm  Skeet Latch MD Electronically signed by Skeet Latch MD Signature Date/Time: 07/31/2022/2:54:29 PM  Final    MONITORS  LONG TERM MONITOR (3-14 DAYS) 02/07/2022  Narrative Patch Wear Time:  13 days and 17 hours (2023-07-25T21:29:14-0400 to 2023-08-08T15:11:09-398)  Patient had a min HR of 52 bpm, max HR of 182 bpm, and avg HR of 72 bpm. Predominant underlying rhythm was Sinus Rhythm. Bundle Branch Block/IVCD was present. QRS morphology changes were present throughout recording. 3 Ventricular Tachycardia runs occurred, the run with the fastest interval lasting 4 beats with a max rate of 152 bpm, the longest lasting 5 beats with an avg rate of 122 bpm. 10 Supraventricular Tachycardia runs occurred, the run with the fastest interval lasting 4 beats with a max rate of 182 bpm, the longest lasting 15.1 secs with an avg rate of 103 bpm. Isolated SVEs were rare (<1.0%), SVE Couplets were rare (<1.0%), and SVE Triplets were rare (<1.0%). Isolated VEs were rare (<1.0%), VE Couplets were rare (<1.0%), and no VE Triplets were present.  Conclusion Paroxysmal SVTs, 3 short runs of VT lasting 4 beats noted. Overall no significant/persistent arrhythmias. Patient triggered events associated with rare PACs.   CT SCANS  CT CORONARY MORPH W/CTA COR W/SCORE  01/14/2022  Addendum 01/14/2022  2:36 PM ADDENDUM REPORT: 01/14/2022 14:34  EXAM: OVER-READ INTERPRETATION  CT CHEST  The following report is an over-read performed by radiologist Dr. Jason Nest [facility] on 01/14/2022. This over-read does not include interpretation of cardiac or coronary anatomy or pathology. The coronary calcium score interpretation by the cardiologist is attached.  COMPARISON:  CT 12/16/2019.  FINDINGS: Vascular: No significant cardiovascular findings. Coronary artery calcifications.  Mediastinum/Nodes: No lymphadenopathy.  Lungs/Pleura: Stable 4 mm right lower lobe pulmonary nodule (series 12, image 6). This is stable since June 2021 and benign. No new suspicious pulmonary nodules.  Upper Abdomen: No acute findings. Low-density 1.1 cm hepatic lesion consistent with a cyst, unchanged from prior exam.  Musculoskeletal: No acute osseous abnormality. No suspicious osseous lesion.  IMPRESSION: Stable 4 mm pulmonary nodule in the right lower lobe. This is stable since June 2021 and is likely benign. No follow-up imaging is recommended.  No new suspicious pulmonary nodules.   Electronically Signed By: Maurine Simmering M.D. On: 01/14/2022 14:34  Narrative CLINICAL DATA:  Chest pain  EXAM: Cardiac/Coronary  CTA  TECHNIQUE: The patient was scanned on a [employer].  FINDINGS: A 100 kV prospective scan was triggered in the descending thoracic aorta at 111 HU's. Axial non-contrast 3 mm slices were carried out through the heart. The data set was analyzed on a dedicated work station and scored using the [place]. Gantry rotation speed was 250 msecs and collimation was .6 mm. No beta blockade and 0.8 mg of sl NTG was given. The 3D data set was reconstructed in 5% intervals of the 67-82 % of the R-R cycle. Diastolic phases were analyzed on a dedicated work station using MPR, MIP and VRT modes. The patient received 80 cc of  contrast.  Aorta: Normal size. Aortic root and descending aorta calcifications. No dissection.  Aortic Valve:  Trileaflet.  No calcifications.  Coronary Arteries:  Normal coronary origin.  Right dominance.  RCA is a dominant artery that gives rise to PDA and PLA. There is calcified plaque in the ostial RCA causing minimal stenosis (<25%).  Left main gives rise to LAD and LCX arteries.  LM has no disease.  LAD has calcified plaque in the proximal segment causing mild stenosis (25-49%).  LCX is a non-dominant artery that gives rise to two  obtuse marginal branches. There is no plaque.  Other findings:  Normal pulmonary vein drainage into the left atrium.  Normal left atrial appendage without a thrombus.  Normal size of the pulmonary artery.  IMPRESSION: 1. Coronary calcium score of 216. This was 87th percentile for age and sex matched control.  2. Normal coronary origin with right dominance.  3. Calcified plaque in the proximal LAD causing mild stenosis (25-49%).  4. Minimal stenosis in the ostial RCA (<25%).  5. CAD-RADS 2. Mild non-obstructive CAD (25-49%). Consider non-atherosclerotic causes of chest pain. Consider preventive therapy and risk factor modification.  Electronically Signed: By: Kate Sable M.D. On: 01/14/2022 12:40   CT SCANS  CT CORONARY MORPH W/CTA COR W/SCORE 12/16/2019  Addendum 12/16/2019  4:33 PM ADDENDUM REPORT: 12/16/2019 16:30  ADDENDUM: OVER-READ INTERPRETATION  CT CHEST  The following report is an over-read performed by radiologist Dr. Forest Gleason [facility] on 12/16/2019. This over-read does not include interpretation of cardiac or coronary anatomy or pathology. The coronary CTA interpretation by the cardiologist is attached.  COMPARISON:  Chest radiograph 11/11/2019  FINDINGS:  Trace bilateral pleural fluid. Right lower lobe pulmonary nodule of 4 mm on 07/11.  Aortic atherosclerosis. Tortuous  thoracic aorta. No central pulmonary embolism, on this non-dedicated study.  No imaged thoracic adenopathy.  Normal imaged portions of the liver, spleen, stomach.  No acute osseous abnormality. Mid to upper thoracic vertebral hemangioma.  IMPRESSION:  1.  No acute findings in the imaged extracardiac chest. 2. Trace bilateral pleural fluid. 3. 4 mm right lower lobe pulmonary nodule. No follow-up needed if patient is low-risk. Non-contrast chest CT can be considered in 12 months if patient is high-risk. This recommendation follows the consensus statement: Guidelines for Management of Incidental Pulmonary Nodules Detected on CT Images: From the Fleischner Society 2017; Radiology 2017; 284:228-243.   Electronically Signed By: Abigail Miyamoto M.D. On: 12/16/2019 16:30  Narrative CLINICAL DATA:  18F with hypertension, hyperlipidemia, family history of CAD and chest tightness.  EXAM: Cardiac/Coronary  CT  TECHNIQUE: The patient was scanned on a [employer].  FINDINGS: A 120 kV prospective scan was triggered in the descending thoracic aorta at 111 HU's. Axial non-contrast 3 mm slices were carried out through the heart. The data set was analyzed on a dedicated work station and scored using the [place]. Gantry rotation speed was 250 msecs and collimation was .6 mm. No beta blockade and 0.8 mg of sl NTG was given. The 3D data set was reconstructed in 5% intervals of the 67-82 % of the R-R cycle. Diastolic phases were analyzed on a dedicated work station using MPR, MIP and VRT modes. The patient received 80 cc of contrast.  Aorta: Normal size. Ascending aorta 3.2 cm. Mild calcification of the aortic root. No dissection.  Aortic Valve:  Trileaflet.  No calcifications.  Coronary Arteries:  Normal coronary origin.  Right dominance.  RCA is a large dominant artery that gives rise to R-PDA and two PLV branches. There is no plaque.  Left main is a large artery  that gives rise to LAD and LCX arteries.  LAD is a large vessel that is heavily calcified at the level of D1 but mildly obstructive (25-49%). D1 is a large, branching vessel with minimal (<25%) calcified plaque at the ostium and proximally.  LCX is a large non-dominant artery that gives rise to a small OM1, large OM2 and OM3. There is no plaque.  Other findings:  Normal pulmonary vein drainage  into the left atrium.  Normal let atrial appendage without a thrombus.  Normal size of the pulmonary artery.  IMPRESSION: 1. Coronary calcium score of 182. This was 88th percentile for age and sex matched control.  2. Normal coronary origin with right dominance.  3. There is mild (25-49%) calcified plaque in the proximal LAD. (CAD-RADS 2).  4. Recommend aggressive risk factor modification, including LDL goal <70.  Over-read of the non-cardiac thoracic structures by Radiology is pending.  Skeet Latch, MD  Electronically Signed: By: Skeet Latch On: 12/16/2019 15:59   CARDIAC MRI  MR CARDIAC MORPHOLOGY W WO CONTRAST 01/30/2022  Narrative CLINICAL DATA:  Hx of moderate LVH, evaluate for fibrosis, evaluate for HCM  EXAM: CARDIAC MRI  TECHNIQUE: The patient was scanned on a 1.5 Tesla Siemens magnet. A dedicated cardiac coil was used. Functional imaging was done using F[place] sequences. 2,3, and 4 chamber views were done to assess for RWMA's. Modified Simpson's rule using a short axis stack was used to calculate an ejection fraction on a dedicated work Conservation officer, nature. The patient received 10 cc of Gadavist. After 10 minutes inversion recovery sequences were used to assess for infiltration and scar tissue. Phase contrast velocity mapping performed.  CONTRAST:  10 cc  of Gadavist  FINDINGS: 1.  Normal left ventricular size and systolic function (LVEF = 73%).  There is severe asymmetric basal septal hypertrophy, basal septal segment measuring 1.6  cm.  There is systolic anterior motion (SAM) of mitral valve causing LVOT obstruction and mild Mitral regurgitation.  There are no regional wall motion abnormalities.  There is transmural late gadolinium enhancement (scar) in the left ventricular myocardium-basal segment at the point of RV insertion.  LVEDV: 184 ml  LVESV: 49 ml  SV: 135 ml  CO: 8.8 L/min  Myocardial mass: 197 g  2. Normal right ventricular size, thickness and systolic function (RVEF = 54%). There are no regional wall motion abnormalities.  3.  Normal left and right atrial size.  4. Normal size of the aortic root, ascending aorta and pulmonary artery.  Systemic flow: Total volume 91.25 ml, RF 2.25%, RV 2.41m.  Pulmonary flow: Total volume 89.3 ml, RF 1.76%, RV 1.631m Qp/Qs: 0.98  5.  Mild mitral regurgitation  6.  Normal pericardium.  No pericardial effusion.  IMPRESSION: 1. Normal LV and RV function, LVEF 73%  2. Severe asymmetric LV basal septal hypertrophy, basal septal segment measuring 1.6 cm.  3. Transmural LGE/scar present in the LV basal segment (point of maximal thickness and RV insertion)  4. There is systolic anterior motion (SAM) of mitral valve causing LVOT obstruction and mild Mitral regurgitation.  5. Findings consistent with Hypertrophic obstructive Cardiomyopathy (HOCM).   Electronically Signed By: BrKate Sable.D. On: 01/30/2022 09:48         Recent Labs: 09/24/2021: TSH 1.32 02/22/2022: Hemoglobin 14.2; Platelets 136.0 05/24/2022: ALT 35; BUN 15; Creatinine, Ser 0.84; Potassium 4.0; Sodium 142  Recent Lipid Panel    Component Value Date/Time   CHOL 98 05/24/2022 0809   CHOL 126 05/01/2020 1004   TRIG 112.0 05/24/2022 0809   HDL 39.20 05/24/2022 0809   HDL 44 05/01/2020 1004   CHOLHDL 2 05/24/2022 0809   VLDL 22.4 05/24/2022 0809   LDLCALC 36 05/24/2022 0809   LDLCALC 60 05/01/2020 1004   LDLDIRECT 57 01/19/2020 0920   LDLDIRECT 144.1 03/30/2013 0829     Physical Exam:    VS:  BP 118/68   Pulse 75  Ht '5\' 5"'$  (1.651 m)   Wt 174 lb (78.9 kg)   LMP 06/24/2010   SpO2 98%   BMI 28.96 kg/m     Wt Readings from Last 3 Encounters:  08/20/22 174 lb (78.9 kg)  06/13/22 168 lb (76.2 kg)  05/29/22 169 lb 12.8 oz (77 kg)    GEN:  Well nourished, well developed in no acute distress HEENT: Normal NECK: No JVD CARDIAC: RRR, harsh systolic murmur worse with standing, no rubs, gallops RESPIRATORY:  Clear to auscultation without rales, wheezing or rhonchi  ABDOMEN: Soft, non-tender, non-distended MUSCULOSKELETAL:  No edema; No deformity  SKIN: Warm and dry NEUROLOGIC:  Alert and oriented x 3 PSYCHIATRIC:  Normal affect   ASSESSMENT:    1. HOCM (hypertrophic obstructive cardiomyopathy) (HCC)   2. Palpitations    PLAN:    Hypertrophic Cardiomyopathy - with presents for discussion of mavacamten and myosin modulation medication - discussed - Gradient type: inducible - NYHA Class II on metoprolol 75 and mavacamten 2.5 mg - she is now on Medicare and having financial concerns about therapy; we have referred her to the patient assistance association. - normal LV function - will get metoprolol to 100 mg; stress echo for her 12 week visit; she may need 5 mg mavacamten - we have discussed [facility] SRT vs SRT here if financial issues cause her to keep of therapy - we have discussed her father's history and her two children (age 10 and 40) her daugther does not have insurance.  We have discussed screening for both.  She will see genetics for further discussion - will get non live ziopatch; presently her 5 year SCD risk is 2.12% Based on the absence of risk factors, this patient does not have an indication for an ICD (Class 3 - No Benefit)   CAD - on atorvastatin  Time Spent Directly with Patient:   I have spent a total of 60 minutes with the patient reviewing notes, imaging, EKGs, labs and examining the patient as well as establishing an  assessment and plan that was discussed personally with the patient.  > 50% of time was spent in direct patient care         Medication Adjustments/Labs and Tests Ordered: Current medicines are reviewed at length with the patient today.  Concerns regarding medicines are outlined above.  Orders Placed This Encounter  Procedures   Ambulatory referral to Genetics   Cardiac Stress Test: Informed Consent Details: Physician/Practitioner Attestation; Transcribe to consent form and obtain patient signature   LONG TERM MONITOR (3-14 DAYS)   ECHOCARDIOGRAM STRESS TEST   Meds ordered this encounter  Medications   metoprolol succinate (TOPROL-XL) 100 MG 24 hr tablet    Sig: Take 1 tablet (100 mg total) by mouth daily. Take with or immediately following a meal.    Dispense:  90 tablet    Refill:  3    Dose change (increased)    Patient Instructions  Medication Instructions:  Your physician has recommended you make the following change in your medication:   1) INCREASE metoprolol succinate (Toprol XL) to '100mg'$  daily  *If you need a refill on your cardiac medications before your next appointment, please call your pharmacy*  Lab Work: None  Testing/Procedures: Your physician has requested that you have a stress echocardiogram for May 2024. For further information please visit [email or web address]. Please follow instruction sheet as given.   Your physician has requested that you wear a Zio heart monitor for 7 days. This will be  mailed to your home with instructions on how to apply the monitor and how to return it when finished. Please allow 2 weeks after returning the heart monitor before our office calls you with the results.   Follow-Up: At [facility], you and your health needs are our priority.  As part of our continuing mission to provide you with exceptional heart care, we have created designated Provider Care Teams.  These Care Teams include your primary Cardiologist  (physician) and Advanced Practice Providers (APPs -  Physician Assistants and Nurse Practitioners) who all work together to provide you with the care you need, when you need it.  Your next appointment:   6 month(s)  Provider:   Rudean Haskell, MD  Other Instructions You have been referred to a genetic counselor, Lattie Corns. A scheduler will call you to schedule an appointment to meet with them.  ZIO XT- Long Term Monitor Instructions     Your physician has requested you wear a ZIO patch monitor for 7 days.  This is a single patch monitor. [employer] supplies one patch monitor per enrollment. Additional  stickers are not available. Please do not apply patch if you will be having a Nuclear Stress Test,  Echocardiogram, Cardiac CT, MRI, or Chest Xray during the period you would be wearing the  monitor. The patch cannot be worn during these tests. You cannot remove and re-apply the  ZIO XT patch monitor.  Your ZIO patch monitor will be mailed 3 day USPS to your address on file. It may take 3-5 days  to receive your monitor after you have been enrolled.  Once you have received your monitor, please review the enclosed instructions. Your monitor  has already been registered assigning a specific monitor serial # to you.     Billing and Patient Assistance Program Information     We have supplied [employer] with any of your insurance information on file for billing purposes.  [employer] offers a sliding scale Patient Assistance Program for patients that do not have  insurance, or whose insurance does not completely cover the cost of the ZIO monitor.  You must apply for the Patient Assistance Program to qualify for this discounted rate.  To apply, please call [employer] at [phone], select option 4, select option 2, ask to apply for  Patient Assistance Program. Theodore Demark will ask your household income, and how many people  are in your household. They will quote your out-of-pocket cost based on that  information.  [employer] will also be able to set up a 110-month interest-free payment plan if needed.     Applying the monitor     Shave hair from upper left chest.  Hold abrader disc by orange tab. Rub abrader in 40 strokes over the upper left chest as  indicated in your monitor instructions.  Clean area with 4 enclosed alcohol pads. Let dry.  Apply patch as indicated in monitor instructions. Patch will be placed under collarbone on left  side of chest with arrow pointing upward.  Rub patch adhesive wings for 2 minutes. Remove white label marked "1". Remove the white  label marked "2". Rub patch adhesive wings for 2 additional minutes.  While looking in a mirror, press and release button in center of patch. A small green light will  flash 3-4 times. This will be your only indicator that the monitor has been turned on.  Do not shower for the first 24 hours. You may shower after the first 24 hours.  Press  the button if you feel a symptom. You will hear a small click. Record Date, Time and  Symptom in the Patient Logbook.  When you are ready to remove the patch, follow instructions on the last 2 pages of Patient  Logbook. Stick patch monitor onto the last page of Patient Logbook.  Place Patient Logbook in the blue and white box. Use locking tab on box and tape box closed  securely. The blue and white box has prepaid postage on it. Please place it in the mailbox as  soon as possible. Your physician should have your test results approximately 7 days after the  monitor has been mailed back to [facility].  Call [place] at [phone] if you have questions regarding  your ZIO XT patch monitor. Call them immediately if you see an orange light blinking on your  monitor.  If your monitor falls off in less than 4 days, contact our Monitor department at [phone].  If your monitor becomes loose or falls off after 4 days call [employer] at [phone] for  suggestions on  securing your monitor.     Signed, Werner Lean, MD  08/20/2022 3:57 PM    [place]

## 2022-08-20 NOTE — Progress Notes (Unsigned)
Enrolled for [employer] to mail a ZIO XT long term holter monitor to the patients address on file.  

## 2022-08-21 DIAGNOSIS — D0512 Intraductal carcinoma in situ of left breast: Secondary | ICD-10-CM | POA: Diagnosis not present

## 2022-08-25 DIAGNOSIS — R002 Palpitations: Secondary | ICD-10-CM

## 2022-08-27 DIAGNOSIS — H2513 Age-related nuclear cataract, bilateral: Secondary | ICD-10-CM | POA: Diagnosis not present

## 2022-08-27 DIAGNOSIS — M3501 Sicca syndrome with keratoconjunctivitis: Secondary | ICD-10-CM | POA: Diagnosis not present

## 2022-09-02 DIAGNOSIS — Z79810 Long term (current) use of selective estrogen receptor modulators (SERMs): Secondary | ICD-10-CM | POA: Diagnosis not present

## 2022-09-02 DIAGNOSIS — N888 Other specified noninflammatory disorders of cervix uteri: Secondary | ICD-10-CM | POA: Diagnosis not present

## 2022-09-02 DIAGNOSIS — N882 Stricture and stenosis of cervix uteri: Secondary | ICD-10-CM | POA: Diagnosis not present

## 2022-09-02 DIAGNOSIS — D259 Leiomyoma of uterus, unspecified: Secondary | ICD-10-CM | POA: Diagnosis not present

## 2022-09-02 DIAGNOSIS — R9389 Abnormal findings on diagnostic imaging of other specified body structures: Secondary | ICD-10-CM | POA: Diagnosis not present

## 2022-09-10 DIAGNOSIS — R002 Palpitations: Secondary | ICD-10-CM | POA: Diagnosis not present

## 2022-09-19 ENCOUNTER — Telehealth: Payer: Self-pay | Admitting: Internal Medicine

## 2022-09-19 NOTE — Telephone Encounter (Signed)
Sertraline has been sent to [facility]

## 2022-09-19 NOTE — Telephone Encounter (Signed)
Pt called stating she need a refill on sertraline sent to [facility]

## 2022-09-26 ENCOUNTER — Other Ambulatory Visit (INDEPENDENT_AMBULATORY_CARE_PROVIDER_SITE_OTHER): Payer: Medicare HMO

## 2022-09-26 DIAGNOSIS — I10 Essential (primary) hypertension: Secondary | ICD-10-CM

## 2022-09-26 DIAGNOSIS — D696 Thrombocytopenia, unspecified: Secondary | ICD-10-CM | POA: Diagnosis not present

## 2022-09-26 DIAGNOSIS — E782 Mixed hyperlipidemia: Secondary | ICD-10-CM | POA: Diagnosis not present

## 2022-09-26 DIAGNOSIS — R7989 Other specified abnormal findings of blood chemistry: Secondary | ICD-10-CM

## 2022-09-26 LAB — CBC WITH DIFFERENTIAL/PLATELET
Basophils Absolute: 0.1 10*3/uL (ref 0.0–0.1)
Basophils Relative: 1 % (ref 0.0–3.0)
Eosinophils Absolute: 0.2 10*3/uL (ref 0.0–0.7)
Eosinophils Relative: 2.7 % (ref 0.0–5.0)
HCT: 43.6 % (ref 36.0–46.0)
Hemoglobin: 14.7 g/dL (ref 12.0–15.0)
Lymphocytes Relative: 36.8 % (ref 12.0–46.0)
Lymphs Abs: 2.7 10*3/uL (ref 0.7–4.0)
MCHC: 33.7 g/dL (ref 30.0–36.0)
MCV: 93 fl (ref 78.0–100.0)
Monocytes Absolute: 0.5 10*3/uL (ref 0.1–1.0)
Monocytes Relative: 7.3 % (ref 3.0–12.0)
Neutro Abs: 3.8 10*3/uL (ref 1.4–7.7)
Neutrophils Relative %: 52.2 % (ref 43.0–77.0)
Platelets: 152 10*3/uL (ref 150.0–400.0)
RBC: 4.69 Mil/uL (ref 3.87–5.11)
RDW: 13.4 % (ref 11.5–15.5)
WBC: 7.4 10*3/uL (ref 4.0–10.5)

## 2022-09-26 LAB — HEPATIC FUNCTION PANEL
ALT: 34 U/L (ref 0–35)
AST: 25 U/L (ref 0–37)
Albumin: 3.9 g/dL (ref 3.5–5.2)
Alkaline Phosphatase: 83 U/L (ref 39–117)
Bilirubin, Direct: 0.1 mg/dL (ref 0.0–0.3)
Total Bilirubin: 0.6 mg/dL (ref 0.2–1.2)
Total Protein: 6 g/dL (ref 6.0–8.3)

## 2022-09-26 LAB — BASIC METABOLIC PANEL WITH GFR
BUN: 14 mg/dL (ref 6–23)
CO2: 27 meq/L (ref 19–32)
Calcium: 10.9 mg/dL — ABNORMAL HIGH (ref 8.4–10.5)
Chloride: 110 meq/L (ref 96–112)
Creatinine, Ser: 0.9 mg/dL (ref 0.40–1.20)
GFR: 66 mL/min
Glucose, Bld: 101 mg/dL — ABNORMAL HIGH (ref 70–99)
Potassium: 4.3 meq/L (ref 3.5–5.1)
Sodium: 145 meq/L (ref 135–145)

## 2022-09-26 LAB — LIPID PANEL
Cholesterol: 111 mg/dL (ref 0–200)
HDL: 39.5 mg/dL (ref >39.00)
LDL Cholesterol: 50 mg/dL (ref 0–99)
NonHDL: 71.54
Total CHOL/HDL Ratio: 3
Triglycerides: 108 mg/dL (ref 0.0–149.0)
VLDL: 21.6 mg/dL (ref 0.0–40.0)

## 2022-09-26 LAB — TSH: TSH: 1.75 u[IU]/mL (ref 0.35–5.50)

## 2022-09-30 ENCOUNTER — Ambulatory Visit (INDEPENDENT_AMBULATORY_CARE_PROVIDER_SITE_OTHER): Payer: Medicare HMO | Admitting: Internal Medicine

## 2022-09-30 ENCOUNTER — Encounter: Payer: Self-pay | Admitting: Internal Medicine

## 2022-09-30 VITALS — BP 118/70 | HR 73 | Temp 98.0°F | Resp 16 | Ht 65.0 in | Wt 175.4 lb

## 2022-09-30 DIAGNOSIS — R319 Hematuria, unspecified: Secondary | ICD-10-CM

## 2022-09-30 DIAGNOSIS — Z0000 Encounter for general adult medical examination without abnormal findings: Secondary | ICD-10-CM

## 2022-09-30 DIAGNOSIS — H9192 Unspecified hearing loss, left ear: Secondary | ICD-10-CM

## 2022-09-30 DIAGNOSIS — D696 Thrombocytopenia, unspecified: Secondary | ICD-10-CM | POA: Diagnosis not present

## 2022-09-30 DIAGNOSIS — I10 Essential (primary) hypertension: Secondary | ICD-10-CM

## 2022-09-30 DIAGNOSIS — I25118 Atherosclerotic heart disease of native coronary artery with other forms of angina pectoris: Secondary | ICD-10-CM | POA: Diagnosis not present

## 2022-09-30 DIAGNOSIS — E782 Mixed hyperlipidemia: Secondary | ICD-10-CM

## 2022-09-30 DIAGNOSIS — F439 Reaction to severe stress, unspecified: Secondary | ICD-10-CM

## 2022-09-30 DIAGNOSIS — D0512 Intraductal carcinoma in situ of left breast: Secondary | ICD-10-CM | POA: Diagnosis not present

## 2022-09-30 DIAGNOSIS — I421 Obstructive hypertrophic cardiomyopathy: Secondary | ICD-10-CM | POA: Diagnosis not present

## 2022-09-30 DIAGNOSIS — R69 Illness, unspecified: Secondary | ICD-10-CM | POA: Diagnosis not present

## 2022-09-30 DIAGNOSIS — R9389 Abnormal findings on diagnostic imaging of other specified body structures: Secondary | ICD-10-CM

## 2022-09-30 NOTE — Progress Notes (Signed)
Subjective:    Patient ID: Vivia EwingMartha H Valenza, female    DOB: April 09, 1956, 67 y.o.   MRN: [id number]  Patient here for  Chief Complaint  Patient presents with   Annual Exam    HPI Reports she is doing relatively well.  Trying to stay active.  No chest pain reported.  Breathing stable.  Saw cardiology 08/20/22 - f/u HOCM.  Planning stress echo and echo - 10/24/22.  Continues on metoprolol and mavacamten. Saw gyn 3/1/124 - endometrial thickening.  Attempted endometrial biopsy - unsuccessful.  Elected to f/u pelvic ultrasound in 3 months.  Also saw Dr Tonna BoehringerSakai 08/21/22 - f/u DCIS left breast.  On tamoxifen. No abdominal pain or bowel change reported.  Scheduled for colonoscopy.   Past Medical History:  Diagnosis Date   Breast cancer    Chest pain    Dyspnea    Family history of adverse reaction to anesthesia    SON-LUNGS FILLED UP WITH FLUID AFTER APPENDECTOMY   Hematuria    HOCM (hypertrophic obstructive cardiomyopathy)    a. 10/2019 Echo: EF 70-75%, mod LVH. No rwma. Mild SAM, mild LVOT obs(peak grad 20 @ rest 26 w/ Valsalva); b. 01/2022 cMRI: EF 73%, sev asymm LV basal septal hypertrophy- basal segment 1.6cm. Transmural LGE/scar in LV basal segment (point of max thickness & RV insertion). SAM->mild MR & LVOT obs-->HOCM.   Hypercholesterolemia    Hypertension    Nonobstructive CAD (coronary artery disease)    a.10/2015 MV: No ischemia/infarct; b. 11/2019 Cor CTA: mild nonobs LAD dzs; c. 12/2021 Cor CTA: Ca2+ = 216 (87th%'ile). LM nl, LAD 25-49p, LCX nondom, nl, RCA <25ost.   Palpitations    a. 12/2021 Zio: RSR @ 72 (52-182). 3 runs NSVT (longest 5 beats, fastest 152 x 4 beats). 10 SVT runs (longest 15.1 secs @ 103, fastest 4 beats @ 182). Rare PACs/PVCs.   Personal history of radiation therapy    Pulmonary nodule    a. 12/2021 CT chest: stable 4mm RLL pulm nodule, likely benign, No f/u imaging req.   Systolic murmur    Past Surgical History:  Procedure Laterality Date   BREAST BIOPSY Left  10/27/2019   Affirm bx-distortion-     clip-path ADH   BREAST BIOPSY Left 01/07/2020   Affim bx 1 area/ x clip and ribbon clip/ ADH   BREAST LUMPECTOMY     BREAST LUMPECTOMY WITH NEEDLE LOCALIZATION Left 02/14/2020   Procedure: BREAST LUMPECTOMY WITH NEEDLE LOCALIZATION;  Surgeon: Earline MayotteByrnett, Jeffrey W, MD;  Location: [facility] ORS;  Service: General;  Laterality: Left;   CHOLECYSTECTOMY  10/2020   FACIAL FRACTURE SURGERY     X 2   Family History  Problem Relation Age of Onset   Diabetes Mother    Heart disease Mother    Hypertension Mother    Cancer Father        prostate    Diverticulitis Father    CVA Father    Heart disease Father    Stroke Father    Hypertension Father    Heart murmur Father    Breast cancer Sister    Heart attack Brother    CVA Paternal Grandmother    Breast cancer Cousin    Social History   Socioeconomic History   Marital status: Married    Spouse name: Not on file   Number of children: 2   Years of education: Not on file   Highest education level: Not on file  Occupational History   Not on file  Tobacco Use   Smoking status: Never   Smokeless tobacco: Never  Vaping Use   Vaping Use: Never used  Substance and Sexual Activity   Alcohol use: No    Alcohol/week: 0.0 standard drinks of alcohol   Drug use: No   Sexual activity: Not Currently  Other Topics Concern   Not on file  Social History Narrative   Not on file   Social Determinants of Health   Financial Resource Strain: Low Risk  (02/01/2022)   Overall Financial Resource Strain (CARDIA)    Difficulty of Paying Living Expenses: Not hard at all  Food Insecurity: No Food Insecurity (02/01/2022)   Hunger Vital Sign    Worried About Running Out of Food in the Last Year: Never true    Ran Out of Food in the Last Year: Never true  Transportation Needs: No Transportation Needs (02/01/2022)   PRAPARE - Administrator, Civil Service (Medical): No    Lack of Transportation (Non-Medical):  No  Physical Activity: Not on file  Stress: No Stress Concern Present (02/01/2022)   [employer] of Occupational Health - Occupational Stress Questionnaire    Feeling of Stress : Not at all  Social Connections: Not on file     Review of Systems  Constitutional:  Negative for appetite change and unexpected weight change.  HENT:  Negative for congestion, sinus pressure and sore throat.   Eyes:  Negative for pain and visual disturbance.  Respiratory:  Negative for cough, chest tightness and shortness of breath.   Cardiovascular:  Negative for chest pain and palpitations.  Gastrointestinal:  Negative for abdominal pain, diarrhea, nausea and vomiting.  Genitourinary:  Negative for difficulty urinating and dysuria.  Musculoskeletal:  Negative for joint swelling and myalgias.  Skin:  Negative for color change and rash.  Neurological:  Negative for dizziness and headaches.  Hematological:  Negative for adenopathy. Does not bruise/bleed easily.  Psychiatric/Behavioral:  Negative for agitation and dysphoric mood.        Objective:     BP 118/70   Pulse 73   Temp 98 F (36.7 C)   Resp 16   Ht  (1.651 m)   Wt 175 lb 6.4 oz (79.6 kg)   LMP 06/24/2010   SpO2 98%   BMI 29.19 kg/m  Wt Readings from Last 3 Encounters:  09/30/22 175 lb 6.4 oz (79.6 kg)  08/20/22 174 lb (78.9 kg)  06/13/22 168 lb (76.2 kg)    Physical Exam Vitals reviewed.  Constitutional:      General: She is not in acute distress.    Appearance: Normal appearance.  HENT:     Head: Normocephalic and atraumatic.     Right Ear: External ear normal.     Left Ear: External ear normal.  Eyes:     General: No scleral icterus.       Right eye: No discharge.        Left eye: No discharge.     Conjunctiva/sclera: Conjunctivae normal.  Neck:     Thyroid: No thyromegaly.  Cardiovascular:     Rate and Rhythm: Normal rate and regular rhythm.  Pulmonary:     Effort: No respiratory distress.     Breath  sounds: Normal breath sounds. No wheezing.  Abdominal:     General: Bowel sounds are normal.     Palpations: Abdomen is soft.     Tenderness: There is no abdominal tenderness.  Musculoskeletal:        General:  No swelling or tenderness.     Cervical back: Neck supple. No tenderness.  Lymphadenopathy:     Cervical: No cervical adenopathy.  Skin:    Findings: No erythema or rash.  Neurological:     Mental Status: She is alert.  Psychiatric:        Mood and Affect: Mood normal.        Behavior: Behavior normal.      Outpatient Encounter Medications as of 09/30/2022  Medication Sig   aspirin EC 81 MG EC tablet Take 1 tablet (81 mg total) by mouth daily.   atorvastatin (LIPITOR) 40 MG tablet Take 1 tablet (40 mg total) by mouth daily.   CALCIUM-MAGNESIUM-ZINC PO Take by mouth daily at 6 (six) AM.   mavacamten 2.5 MG CAPS Take 2.5 mg by mouth daily.   metoprolol succinate (TOPROL-XL) 100 MG 24 hr tablet Take 1 tablet (100 mg total) by mouth daily. Take with or immediately following a meal.   Multiple Vitamin (MULTIVITAMIN) tablet Take 1 tablet by mouth daily.   sertraline (ZOLOFT) 50 MG tablet Take 1 tablet by mouth once daily   tamoxifen (NOLVADEX) 20 MG tablet Take 20 mg by mouth daily.   tobramycin-dexamethasone (TOBRADEX) ophthalmic ointment Place 1 Application into the left eye 2 (two) times daily.   Vitamin D, Cholecalciferol, 10 MCG (400 UNIT) CAPS Take by mouth.   No facility-administered encounter medications on file as of 09/30/2022.     Lab Results  Component Value Date   WBC 7.4 09/26/2022   HGB 14.7 09/26/2022   HCT 43.6 09/26/2022   PLT 152.0 09/26/2022   GLUCOSE 101 (H) 09/26/2022   CHOL 111 09/26/2022   TRIG 108.0 09/26/2022   HDL 39.50 09/26/2022   LDLDIRECT 57 01/19/2020   LDLCALC 50 09/26/2022   ALT 34 09/26/2022   AST 25 09/26/2022   NA 145 09/26/2022   K 4.3 09/26/2022   CL 110 09/26/2022   CREATININE 0.90 09/26/2022   BUN 14 09/26/2022   CO2 27  09/26/2022   TSH 1.75 09/26/2022   INR 1.0 11/19/2019   HGBA1C 5.2 03/30/2013    MM DIAG BREAST TOMO BILATERAL  Result Date: 08/15/2022 CLINICAL DATA:  67 year old female for annual follow-up. History of LEFT breast cancer and lumpectomy in 2021. EXAM: DIGITAL DIAGNOSTIC BILATERAL MAMMOGRAM WITH TOMOSYNTHESIS TECHNIQUE: Bilateral digital diagnostic mammography and breast tomosynthesis was performed. COMPARISON:  Previous exam(s). ACR Breast Density Category c: The breasts are heterogeneously dense, which may obscure small masses. FINDINGS: Full field views of both breasts and a magnification view of the lumpectomy site demonstrate no suspicious mass, nonsurgical distortion or worrisome calcifications. LEFT lumpectomy changes are again noted. IMPRESSION: No evidence of breast malignancy. RECOMMENDATION: Per protocol, as the patient is now 2 or more years status post lumpectomy, she may return to annual screening mammography in 1 year. However, given the history of breast cancer, the patient remains eligible for annual diagnostic mammography if preferred. (Code:[id number]) I have discussed the findings and recommendations with the patient. If applicable, a reminder letter will be sent to the patient regarding the next appointment. BI-RADS CATEGORY  2: Benign. Electronically Signed   By: Harmon Pier M.D.   On: 08/15/2022 14:35       Assessment & Plan:  Routine general medical examination at a health care facility  Hypercalcemia Assessment & Plan: Recheck calcium level.  Slightly elevated on recent lab check.   Orders: -     Calcium; Future  Coronary artery disease of  native artery of native heart with stable angina pectoris Assessment & Plan: Previously evaluated by cardiology for chest pain with minimal exertion.  To review, recommended f/u CTA and cardiac monitor.  Monitor - Paroxysmal SVTs, 3 short runs of VT lasting 4 beats noted. Overall no significant/persistent arrhythmias. CTA - Stable 4  mm pulmonary nodule in the right lower lobe. This is stable since June 2021 and is likely benign. No follow-up imaging recommended. Coronary calcium score of 216.  Calcified plaque - proximal LAD mild stenosis - 35-49%.  Minimal stenosis - ostial RCA - mild non obstructive CAD.  Recommended continuing aspirin, lipitor and toprol.  MRCM - findings c/w hypertrophic obstructive cardiomyopathy.  Felt better with increase in toprol.  Saw Dr Izora Ribas for evaluation of hypertrophic cardiomyopathy - to assess - started mavacamten.  Feels better.  SOB has improved. Scheduled for f/u echo and stress echo 10/24/22.  Currently stable.    Ductal carcinoma in situ (DCIS) of left breast Assessment & Plan: S/p excision and XRT.  Now followed by Dr Tonna Boehringer. Saw Dr Lemar Livings - 08/28/21 - tamoxifen for 5 years total.(dx 01/2020)  F/u one year diagnostic mammogram.    Recommended f/u in one year.    Endometrial thickening on ultrasound Assessment & Plan: Saw gyn 3/1/124 - endometrial thickening.  Attempted endometrial biopsy - unsuccessful.  Elected to f/u pelvic ultrasound in 3 months.    Essential hypertension Assessment & Plan: Continue metoprolol.  Blood pressure as outlined.  No changes in medication.  Follow pressures.  Follow metabolic panel.    Hematuria, unspecified type Assessment & Plan: Evaluated by urology - 02/28/21 - cystoscopy - normal.  Follow urine. CT urogram if hematuria worsens or if develops gross hematuria.  F/u two years.    HOCM (hypertrophic obstructive cardiomyopathy) Assessment & Plan: Recent cardiac MRI with an EF of 73% and severe asymmetric left ventricular basal septal hypertrophy with transmural LGE suggestive of hypertrophic obstructive cardiomyopathy. Dr Izora Ribas - (04/11/22) - stress echo for inducible gradients; scheduled for 04/22/22. If she has a significant LVOT gradient (> 30 mm Hg) we will attempt mavacamten.  Per 04/2022 note - started mavacamten. Feeling better since  starting.  Breathing stable.  Today - symptoms stable.  Evaluated 07/2022.  Planning for f/u echo 10/24/22.     Mixed hyperlipidemia Assessment & Plan: Low cholesterol diet and exercise.  Continue lipitor.  Follow lipid panel and liver function tests.   Lab Results  Component Value Date   CHOL 111 09/26/2022   HDL 39.50 09/26/2022   LDLCALC 50 09/26/2022   LDLDIRECT 57 01/19/2020   TRIG 108.0 09/26/2022   CHOLHDL 3 09/26/2022     Stress Assessment & Plan: Continues on zoloft.  Overall appears to be handling things well.  Does not feel needs any further intervention.  Follow.    Thrombocytopenia Assessment & Plan: Follow cbc.    Decreased hearing of left ear Assessment & Plan: Notify if desires ENT evaluation.    Health care maintenance Assessment & Plan: Physical today 49/24.  Colonoscopy 11/2016 - few small polyps and internal hemorrhoids.  Recommended f/u colonoscopy in 10 years.  Mammogram 07/2022 - Birads II.       Dale Panola, MD

## 2022-10-06 ENCOUNTER — Encounter: Payer: Self-pay | Admitting: Internal Medicine

## 2022-10-06 DIAGNOSIS — H9190 Unspecified hearing loss, unspecified ear: Secondary | ICD-10-CM | POA: Insufficient documentation

## 2022-10-06 NOTE — Assessment & Plan Note (Signed)
Saw gyn 3/1/124 - endometrial thickening.  Attempted endometrial biopsy - unsuccessful.  Elected to f/u pelvic ultrasound in 3 months.

## 2022-10-06 NOTE — Assessment & Plan Note (Signed)
Evaluated by urology - 02/28/21 - cystoscopy - normal.  Follow urine. CT urogram if hematuria worsens or if develops gross hematuria.  F/u two years.  

## 2022-10-06 NOTE — Assessment & Plan Note (Signed)
Low cholesterol diet and exercise.  Continue lipitor.  Follow lipid panel and liver function tests.   Lab Results  Component Value Date   CHOL 111 09/26/2022   HDL 39.50 09/26/2022   LDLCALC 50 09/26/2022   LDLDIRECT 57 01/19/2020   TRIG 108.0 09/26/2022   CHOLHDL 3 09/26/2022

## 2022-10-06 NOTE — Assessment & Plan Note (Signed)
Recent cardiac MRI with an EF of 73% and severe asymmetric left ventricular basal septal hypertrophy with transmural LGE suggestive of hypertrophic obstructive cardiomyopathy. Dr Izora Ribas - (04/11/22) - stress echo for inducible gradients; scheduled for 04/22/22. If she has a significant LVOT gradient (> 30 mm Hg) we will attempt mavacamten.  Per 04/2022 note - started mavacamten. Feeling better since starting.  Breathing stable.  Today - symptoms stable.  Evaluated 07/2022.  Planning for f/u echo 10/24/22.

## 2022-10-06 NOTE — Assessment & Plan Note (Signed)
Notify if desires ENT evaluation.

## 2022-10-06 NOTE — Assessment & Plan Note (Signed)
Continues on zoloft.  Overall appears to be handling things well.  Does not feel needs any further intervention.  Follow.  

## 2022-10-06 NOTE — Assessment & Plan Note (Signed)
Follow cbc.  

## 2022-10-06 NOTE — Assessment & Plan Note (Signed)
Continue metoprolol.  Blood pressure as outlined.  No changes in medication.  Follow pressures.  Follow metabolic panel.  ?

## 2022-10-06 NOTE — Assessment & Plan Note (Signed)
Physical today 49/24.  Colonoscopy 11/2016 - few small polyps and internal hemorrhoids.  Recommended f/u colonoscopy in 10 years.  Mammogram 07/2022 - Birads II.

## 2022-10-06 NOTE — Assessment & Plan Note (Addendum)
S/p excision and XRT.  Now followed by Dr Tonna Boehringer. Saw Dr Lemar Livings - 08/28/21 - tamoxifen for 5 years total.(dx 01/2020)  F/u one year diagnostic mammogram.    Recommended f/u in one year.

## 2022-10-06 NOTE — Assessment & Plan Note (Signed)
Previously evaluated by cardiology for chest pain with minimal exertion.  To review, recommended f/u CTA and cardiac monitor.  Monitor - Paroxysmal SVTs, 3 short runs of VT lasting 4 beats noted. Overall no significant/persistent arrhythmias. CTA - Stable 4 mm pulmonary nodule in the right lower lobe. This is stable since June 2021 and is likely benign. No follow-up imaging recommended. Coronary calcium score of 216.  Calcified plaque - proximal LAD mild stenosis - 35-49%.  Minimal stenosis - ostial RCA - mild non obstructive CAD.  Recommended continuing aspirin, lipitor and toprol.  MRCM - findings c/w hypertrophic obstructive cardiomyopathy.  Felt better with increase in toprol.  Saw Dr Izora Ribas for evaluation of hypertrophic cardiomyopathy - to assess - started mavacamten.  Feels better.  SOB has improved. Scheduled for f/u echo and stress echo 10/24/22.  Currently stable.

## 2022-10-06 NOTE — Assessment & Plan Note (Signed)
Recheck calcium level.  Slightly elevated on recent lab check.

## 2022-10-11 ENCOUNTER — Telehealth: Payer: Self-pay | Admitting: Internal Medicine

## 2022-10-11 NOTE — Telephone Encounter (Signed)
Pt c/o medication issue:  1. Name of Medication: Camzyos  2. How are you currently taking this medication (dosage and times per day)?   3. Are you having a reaction (difficulty breathing--STAT)? No  4. What is your medication issue? Pt states that medication needs authorization before she's able to get refills. Please advise

## 2022-10-11 NOTE — Telephone Encounter (Signed)
Called pt has a shipment of Camzyos coming on Tuesday.  Thinks she was told needs a prior auth for medication.

## 2022-10-14 NOTE — Telephone Encounter (Signed)
Tried to submit new prior auth request, received the following message: "Information regarding your request The patient currently has access to the requested medication and a Prior Authorization is not needed for the patient/medication."  Rx should be processing ok at pharmacy, plan not requiring prior auth currently.

## 2022-10-16 ENCOUNTER — Telehealth (HOSPITAL_COMMUNITY): Payer: Self-pay | Admitting: *Deleted

## 2022-10-16 NOTE — Telephone Encounter (Signed)
Per DPR left instructions for stress echo with daughter, Crystal.

## 2022-10-17 ENCOUNTER — Encounter: Payer: Self-pay | Admitting: *Deleted

## 2022-10-18 ENCOUNTER — Ambulatory Visit: Payer: Medicare HMO | Admitting: Anesthesiology

## 2022-10-18 ENCOUNTER — Other Ambulatory Visit: Payer: Self-pay

## 2022-10-18 ENCOUNTER — Ambulatory Visit
Admission: RE | Admit: 2022-10-18 | Discharge: 2022-10-18 | Disposition: A | Discharge status: Home / Self Care | Payer: Medicare HMO | Attending: Gastroenterology | Admitting: Gastroenterology

## 2022-10-18 ENCOUNTER — Encounter
Admission: RE | Disposition: A | Discharge status: Home / Self Care | Payer: Self-pay | Source: Home / Self Care | Attending: Gastroenterology

## 2022-10-18 ENCOUNTER — Encounter: Payer: Self-pay | Admitting: *Deleted

## 2022-10-18 DIAGNOSIS — Z83719 Family history of colon polyps, unspecified: Secondary | ICD-10-CM | POA: Insufficient documentation

## 2022-10-18 DIAGNOSIS — I10 Essential (primary) hypertension: Secondary | ICD-10-CM | POA: Insufficient documentation

## 2022-10-18 DIAGNOSIS — Z853 Personal history of malignant neoplasm of breast: Secondary | ICD-10-CM | POA: Insufficient documentation

## 2022-10-18 DIAGNOSIS — K640 First degree hemorrhoids: Secondary | ICD-10-CM | POA: Diagnosis not present

## 2022-10-18 DIAGNOSIS — D175 Benign lipomatous neoplasm of intra-abdominal organs: Secondary | ICD-10-CM | POA: Diagnosis not present

## 2022-10-18 DIAGNOSIS — Z79899 Other long term (current) drug therapy: Secondary | ICD-10-CM | POA: Diagnosis not present

## 2022-10-18 DIAGNOSIS — R198 Other specified symptoms and signs involving the digestive system and abdomen: Secondary | ICD-10-CM | POA: Diagnosis not present

## 2022-10-18 DIAGNOSIS — I2510 Atherosclerotic heart disease of native coronary artery without angina pectoris: Secondary | ICD-10-CM | POA: Diagnosis not present

## 2022-10-18 DIAGNOSIS — R194 Change in bowel habit: Secondary | ICD-10-CM | POA: Diagnosis not present

## 2022-10-18 DIAGNOSIS — E7800 Pure hypercholesterolemia, unspecified: Secondary | ICD-10-CM | POA: Diagnosis not present

## 2022-10-18 DIAGNOSIS — F32A Depression, unspecified: Secondary | ICD-10-CM | POA: Insufficient documentation

## 2022-10-18 DIAGNOSIS — Z9049 Acquired absence of other specified parts of digestive tract: Secondary | ICD-10-CM | POA: Insufficient documentation

## 2022-10-18 DIAGNOSIS — K649 Unspecified hemorrhoids: Secondary | ICD-10-CM | POA: Diagnosis not present

## 2022-10-18 HISTORY — PX: COLONOSCOPY WITH PROPOFOL: SHX5780

## 2022-10-18 SURGERY — COLONOSCOPY WITH PROPOFOL
Anesthesia: General

## 2022-10-18 MED ORDER — PROPOFOL 10 MG/ML IV BOLUS
INTRAVENOUS | Status: DC | PRN
  Administered 2022-10-18: 70 mg via INTRAVENOUS
  Administered 2022-10-18: 30 mg via INTRAVENOUS

## 2022-10-18 MED ORDER — LIDOCAINE HCL (CARDIAC) PF 100 MG/5ML IV SOSY
PREFILLED_SYRINGE | INTRAVENOUS | Status: DC | PRN
  Administered 2022-10-18: 100 mg via INTRAVENOUS

## 2022-10-18 MED ORDER — PROPOFOL 500 MG/50ML IV EMUL
INTRAVENOUS | Status: DC | PRN
  Administered 2022-10-18: 165 ug/kg/min via INTRAVENOUS

## 2022-10-18 MED ORDER — SODIUM CHLORIDE 0.9 % IV SOLN: INTRAVENOUS | Status: DC

## 2022-10-18 NOTE — Op Note (Addendum)
[facility] Gastroenterology Patient Name: Mary Walter Procedure Date: 10/18/2022 3:00 PM MRN: [id number] Account #: [id number] Date of Birth: 01-22-1956 Admit Type: Outpatient Age: 67 Room: [facility] ENDO ROOM 3 Gender: Female Note Status: Supervisor Override Instrument Name: Nelda Marseille [id number] Procedure:             Colonoscopy Indications:           Family history of colon polyps, Change in bowel                         habits, Incidental diarrhea noted Providers:             Eather Colas MD, MD Referring MD:          Dale Rossmoyne, MD (Referring MD) Medicines:             Monitored Anesthesia Care Complications:         No immediate complications. Estimated blood loss:                         Minimal. Procedure:             Pre-Anesthesia Assessment:                        - Prior to the procedure, a History and Physical was                         performed, and patient medications and allergies were                         reviewed. The patient is competent. The risks and                         benefits of the procedure and the sedation options and                         risks were discussed with the patient. All questions                         were answered and informed consent was obtained.                         Patient identification and proposed procedure were                         verified by the physician, the nurse, the                         anesthesiologist, the anesthetist and the technician                         in the endoscopy suite. Mental Status Examination:                         alert and oriented. Airway Examination: normal                         oropharyngeal airway and neck mobility. Respiratory  Examination: clear to auscultation. CV Examination:                         normal. Prophylactic Antibiotics: The patient does not                         require prophylactic antibiotics. Prior                          Anticoagulants: The patient has taken no anticoagulant                         or antiplatelet agents. ASA Grade Assessment: III - A                         patient with severe systemic disease. After reviewing                         the risks and benefits, the patient was deemed in                         satisfactory condition to undergo the procedure. The                         anesthesia plan was to use monitored anesthesia care                         (MAC). Immediately prior to administration of                         medications, the patient was re-assessed for adequacy                         to receive sedatives. The heart rate, respiratory                         rate, oxygen saturations, blood pressure, adequacy of                         pulmonary ventilation, and response to care were                         monitored throughout the procedure. The physical                         status of the patient was re-assessed after the                         procedure.                        After obtaining informed consent, the colonoscope was                         passed under direct vision. Throughout the procedure,                         the patient's blood pressure, pulse, and oxygen  saturations were monitored continuously. The                         Colonoscope was introduced through the anus and                         advanced to the the terminal ileum. The colonoscopy                         was somewhat difficult due to a tortuous colon. The                         patient tolerated the procedure well. The quality of                         the bowel preparation was good. The terminal ileum,                         ileocecal valve, appendiceal orifice, and rectum were                         photographed. Findings:      The perianal and digital rectal examinations were normal.      The terminal ileum appeared normal.      Normal  mucosa was found in the entire colon. Biopsies for histology were       taken with a cold forceps from the entire colon for evaluation of       microscopic colitis. Estimated blood loss was minimal.      There was a small lipoma, 12 mm in diameter, in the proximal transverse       colon.      Internal hemorrhoids were found during retroflexion. The hemorrhoids       were Grade I (internal hemorrhoids that do not prolapse).      The exam was otherwise without abnormality on direct and retroflexion       views. Impression:            - The examined portion of the ileum was normal.                        - Normal mucosa in the entire examined colon. Biopsied.                        - Internal hemorrhoids.                        - The examination was otherwise normal on direct and                         retroflexion views. Recommendation:        - Discharge patient to home.                        - Resume previous diet.                        - Continue present medications.                        - Await pathology results.                        -  Repeat colonoscopy in 10 years for screening                         purposes.                        - Return to referring physician as previously                         scheduled. Procedure Code(s):     --- Professional ---                        [phone], Colonoscopy, flexible; with biopsy, single or                         multiple Diagnosis Code(s):     --- Professional ---                        K64.0, First degree hemorrhoids                        R19.4, Change in bowel habit CPT copyright 2022 [employer]. All rights reserved. The codes documented in this report are preliminary and upon coder review may  be revised to meet current compliance requirements. Eather Colas MD, MD 10/18/2022 3:28:49 PM Number of Addenda: 0 Note Initiated On: 10/18/2022 3:00 PM Scope Withdrawal Time: 0 hours 9 minutes 4 seconds  Total  Procedure Duration: 0 hours 15 minutes 59 seconds  Estimated Blood Loss:  Estimated blood loss was minimal.      [facility]

## 2022-10-18 NOTE — Transfer of Care (Signed)
Immediate Anesthesia Transfer of Care Note  Patient: AYLEEN MCKINSTRY  Procedure(s) Performed: COLONOSCOPY WITH PROPOFOL  Patient Location: Endoscopy Unit  Anesthesia Type:General  Level of Consciousness: awake, drowsy, and patient cooperative  Airway & Oxygen Therapy: Patient Spontanous Breathing and Patient connected to face mask  Post-op Assessment: Report given to RN and Post -op Vital signs reviewed and stable  Post vital signs: Reviewed and stable  Last Vitals:  Vitals Value Taken Time  BP 109/48 10/18/22 1537  Temp 36.4 C 10/18/22 1528  Pulse 59 10/18/22 1537  Resp 14 10/18/22 1537  SpO2 97 % 10/18/22 1537    Last Pain:  Vitals:   10/18/22 1537  TempSrc:   PainSc: Asleep         Complications: No notable events documented.

## 2022-10-18 NOTE — Anesthesia Preprocedure Evaluation (Addendum)
Anesthesia Evaluation  Patient identified by MRN, date of birth, ID band Patient awake    Reviewed: Allergy & Precautions, NPO status , Patient's Chart, lab work & pertinent test results  History of Anesthesia Complications Negative for: history of anesthetic complications  Airway Mallampati: III  TM Distance: >3 FB Neck ROM: full    Dental no notable dental hx.    Pulmonary neg pulmonary ROS   Pulmonary exam normal        Cardiovascular hypertension, On Medications and On Home Beta Blockers (-) angina + CAD  Normal cardiovascular exam+ dysrhythmias + Valvular Problems/Murmurs   HOCM  ECHO 2/24 IMPRESSIONS     1. Findings consistent with hypertrophic obstructive cardiomyopathy. Peak  velocity 0.95 m/s. Peak gradient. Compared with the echo 05/2022, outflow  obstruction has decreased. Left ventricular ejection fraction, by  estimation, is 65 to 70%. The left  ventricle has normal function. The left ventricle has no regional wall  motion abnormalities. There is moderate left ventricular hypertrophy. Left  ventricular diastolic parameters are consistent with Grade II diastolic  dysfunction (pseudonormalization).  Elevated left ventricular end-diastolic pressure.   2. Right ventricular systolic function is normal. The right ventricular  size is normal. There is normal pulmonary artery systolic pressure.   3. Systolic anterior motion (SAM) of the mitral valve. The mitral valve  is normal in structure. Mild mitral valve regurgitation. No evidence of  mitral stenosis.      Neuro/Psych  PSYCHIATRIC DISORDERS  Depression    negative neurological ROS     GI/Hepatic negative GI ROS, Neg liver ROS,,,  Endo/Other  negative endocrine ROS    Renal/GU negative Renal ROS  negative genitourinary   Musculoskeletal   Abdominal   Peds  Hematology negative hematology ROS (+)   Anesthesia Other Findings Past Medical  History: No date: Breast cancer (HCC) No date: Chest pain No date: Dyspnea No date: Family history of adverse reaction to anesthesia     Comment:  SON-LUNGS FILLED UP WITH FLUID AFTER APPENDECTOMY No date: Hematuria No date: HOCM (hypertrophic obstructive cardiomyopathy) (HCC)     Comment:  a. 10/2019 Echo: EF 70-75%, mod LVH. No rwma. Mild SAM,               mild LVOT obs(peak grad 20 @ rest 26 w/ Valsalva); b.               01/2022 cMRI: EF 73%, sev asymm LV basal septal               hypertrophy- basal segment 1.6cm. Transmural LGE/scar in               LV basal segment (point of max thickness & RV insertion).              SAM->mild MR & LVOT obs-->HOCM. No date: Hypercholesterolemia No date: Hypertension No date: Nonobstructive CAD (coronary artery disease)     Comment:  a.10/2015 MV: No ischemia/infarct; b. 11/2019 Cor CTA:               mild nonobs LAD dzs; c. 12/2021 Cor CTA: Ca2+ = 216               (87th%'ile). LM nl, LAD 25-49p, LCX nondom, nl, RCA               <25ost. No date: Palpitations     Comment:  a. 12/2021 Zio: RSR @ 72 (52-182). 3 runs NSVT (longest 5  beats, fastest 152 x 4 beats). 10 SVT runs (longest 15.1               secs @ 103, fastest 4 beats @ 182). Rare PACs/PVCs. No date: Personal history of radiation therapy No date: Pulmonary nodule     Comment:  a. 12/2021 CT chest: stable 4mm RLL pulm nodule, likely               benign, No f/u imaging req. No date: Systolic murmur  Past Surgical History: 10/27/2019: BREAST BIOPSY; Left     Comment:  Affirm bx-distortion-     clip-path ADH 01/07/2020: BREAST BIOPSY; Left     Comment:  Affim bx 1 area/ x clip and ribbon clip/ ADH No date: BREAST LUMPECTOMY 02/14/2020: BREAST LUMPECTOMY WITH NEEDLE LOCALIZATION; Left     Comment:  Procedure: BREAST LUMPECTOMY WITH NEEDLE LOCALIZATION;                Surgeon: Earline Mayotte, MD;  Location: [facility] ORS;                Service: General;  Laterality:  Left; 10/2020: CHOLECYSTECTOMY No date: FACIAL FRACTURE SURGERY     Comment:  X 2     Reproductive/Obstetrics negative OB ROS                              Anesthesia Physical Anesthesia Plan  ASA: 3  Anesthesia Plan: General   Post-op Pain Management: Minimal or no pain anticipated   Induction: Intravenous  PONV Risk Score and Plan: Propofol infusion and TIVA  Airway Management Planned: Natural Airway and Nasal Cannula  Additional Equipment:   Intra-op Plan:   Post-operative Plan:   Informed Consent: I have reviewed the patients History and Physical, chart, labs and discussed the procedure including the risks, benefits and alternatives for the proposed anesthesia with the patient or authorized representative who has indicated his/her understanding and acceptance.     Dental Advisory Given  Plan Discussed with: Anesthesiologist, CRNA and Surgeon  Anesthesia Plan Comments: (Patient consented for risks of anesthesia including but not limited to:  - adverse reactions to medications - risk of airway placement if required - damage to eyes, teeth, lips or other oral mucosa - nerve damage due to positioning  - sore throat or hoarseness - Damage to heart, brain, nerves, lungs, other parts of body or loss of life  Patient voiced understanding.)        Anesthesia Quick Evaluation

## 2022-10-18 NOTE — H&P (Signed)
Outpatient short stay form Pre-procedure 10/18/2022  Regis Bill, MD  Primary Physician: Dale Hardwick, MD  Reason for visit:  Change in bowel habits  History of present illness:    67 y/o lady wit history of HLD, hypertension, and breast cancer here for colonoscopy due to change in bowel habits.Mostly loose stools. History of cholecystectomy. No blood thinners except aspirin. No family history of GI malignancies.    Current Facility-Administered Medications:    0.9 %  sodium chloride infusion, , Intravenous, Continuous, Cataleah Stites, Rossie Muskrat, MD, Last Rate: 20 mL/hr at 10/18/22 1348, New Bag at 10/18/22 1348  Medications Prior to Admission  Medication Sig Dispense Refill Last Dose   aspirin EC 81 MG EC tablet Take 1 tablet (81 mg total) by mouth daily. 30 tablet 0 10/17/2022   atorvastatin (LIPITOR) 40 MG tablet Take 1 tablet (40 mg total) by mouth daily. 90 tablet 1 10/17/2022   CALCIUM-MAGNESIUM-ZINC PO Take by mouth daily at 6 (six) AM.   Past Week   mavacamten 2.5 MG CAPS Take 2.5 mg by mouth daily. 30 capsule 2 10/17/2022   metoprolol succinate (TOPROL-XL) 100 MG 24 hr tablet Take 1 tablet (100 mg total) by mouth daily. Take with or immediately following a meal. 90 tablet 3 10/18/2022 at 0630   Multiple Vitamin (MULTIVITAMIN) tablet Take 1 tablet by mouth daily.   10/17/2022   sertraline (ZOLOFT) 50 MG tablet Take 1 tablet by mouth once daily 90 tablet 0 10/17/2022   Vitamin D, Cholecalciferol, 10 MCG (400 UNIT) CAPS Take by mouth.   10/17/2022   tamoxifen (NOLVADEX) 20 MG tablet Take 20 mg by mouth daily.   10/16/2022   tobramycin-dexamethasone (TOBRADEX) ophthalmic ointment Place 1 Application into the left eye 2 (two) times daily. 3.5 g 0      Allergies  Allergen Reactions   Macrobid [Nitrofurantoin Macrocrystal]     unknown   Penicillins     Childhood allergy     Past Medical History:  Diagnosis Date   Breast cancer (HCC)    Chest pain    Dyspnea    Family  history of adverse reaction to anesthesia    SON-LUNGS FILLED UP WITH FLUID AFTER APPENDECTOMY   Hematuria    HOCM (hypertrophic obstructive cardiomyopathy) (HCC)    a. 10/2019 Echo: EF 70-75%, mod LVH. No rwma. Mild SAM, mild LVOT obs(peak grad 20 @ rest 26 w/ Valsalva); b. 01/2022 cMRI: EF 73%, sev asymm LV basal septal hypertrophy- basal segment 1.6cm. Transmural LGE/scar in LV basal segment (point of max thickness & RV insertion). SAM->mild MR & LVOT obs-->HOCM.   Hypercholesterolemia    Hypertension    Nonobstructive CAD (coronary artery disease)    a.10/2015 MV: No ischemia/infarct; b. 11/2019 Cor CTA: mild nonobs LAD dzs; c. 12/2021 Cor CTA: Ca2+ = 216 (87th%'ile). LM nl, LAD 25-49p, LCX nondom, nl, RCA <25ost.   Palpitations    a. 12/2021 Zio: RSR @ 72 (52-182). 3 runs NSVT (longest 5 beats, fastest 152 x 4 beats). 10 SVT runs (longest 15.1 secs @ 103, fastest 4 beats @ 182). Rare PACs/PVCs.   Personal history of radiation therapy    Pulmonary nodule    a. 12/2021 CT chest: stable 4mm RLL pulm nodule, likely benign, No f/u imaging req.   Systolic murmur     Review of systems:  Otherwise negative.    Physical Exam  Gen: Alert, oriented. Appears stated age.  HEENT: PERRLA. Lungs: No respiratory distress CV: RRR Abd: soft, benign,  no masses Ext: No edema    Planned procedures: Proceed with colonoscopy. The patient understands the nature of the planned procedure, indications, risks, alternatives and potential complications including but not limited to bleeding, infection, perforation, damage to internal organs and possible oversedation/side effects from anesthesia. The patient agrees and gives consent to proceed.  Please refer to procedure notes for findings, recommendations and patient disposition/instructions.     Regis Bill, MD [facility] Gastroenterology

## 2022-10-18 NOTE — Interval H&P Note (Signed)
History and Physical Interval Note:  10/18/2022 3:01 PM  Mary Walter  has presented today for surgery, with the diagnosis of Change in bowel habits FH Colon Polyps.  The various methods of treatment have been discussed with the patient and family. After consideration of risks, benefits and other options for treatment, the patient has consented to  Procedure(s): COLONOSCOPY WITH PROPOFOL (N/A) as a surgical intervention.  The patient's history has been reviewed, patient examined, no change in status, stable for surgery.  I have reviewed the patient's chart and labs.  Questions were answered to the patient's satisfaction.     Regis Bill  Ok to proceed with colonoscopy

## 2022-10-18 NOTE — Anesthesia Postprocedure Evaluation (Signed)
Anesthesia Post Note  Patient: Mary Walter  Procedure(s) Performed: COLONOSCOPY WITH PROPOFOL  Patient location during evaluation: Endoscopy Anesthesia Type: General Level of consciousness: awake and alert Pain management: pain level controlled Vital Signs Assessment: post-procedure vital signs reviewed and stable Respiratory status: spontaneous breathing, nonlabored ventilation, respiratory function stable and patient connected to nasal cannula oxygen Cardiovascular status: blood pressure returned to baseline and stable Postop Assessment: no apparent nausea or vomiting Anesthetic complications: no   No notable events documented.   Last Vitals:  Vitals:   10/18/22 1537 10/18/22 1547  BP: (!) 109/48 (!) 150/69  Pulse: (!) 59 61  Resp: 14 18  Temp:    SpO2: 97% 96%    Last Pain:  Vitals:   10/18/22 1547  TempSrc:   PainSc: 0-No pain                 Cleda Mccreedy Chanz Cahall

## 2022-10-21 ENCOUNTER — Encounter: Payer: Self-pay | Admitting: Gastroenterology

## 2022-10-21 ENCOUNTER — Other Ambulatory Visit (INDEPENDENT_AMBULATORY_CARE_PROVIDER_SITE_OTHER): Payer: Medicare HMO

## 2022-10-21 LAB — CALCIUM: Calcium: 10.1 mg/dL (ref 8.4–10.5)

## 2022-10-22 LAB — SURGICAL PATHOLOGY

## 2022-10-23 ENCOUNTER — Other Ambulatory Visit: Payer: Self-pay | Admitting: Internal Medicine

## 2022-10-24 ENCOUNTER — Ambulatory Visit (HOSPITAL_COMMUNITY): Payer: Medicare HMO | Attending: Cardiovascular Disease

## 2022-10-24 ENCOUNTER — Other Ambulatory Visit (HOSPITAL_COMMUNITY): Payer: Medicare HMO

## 2022-10-24 ENCOUNTER — Ambulatory Visit (HOSPITAL_COMMUNITY): Payer: Medicare HMO

## 2022-10-24 DIAGNOSIS — I421 Obstructive hypertrophic cardiomyopathy: Secondary | ICD-10-CM | POA: Diagnosis not present

## 2022-10-25 ENCOUNTER — Telehealth: Payer: Self-pay

## 2022-10-25 DIAGNOSIS — I421 Obstructive hypertrophic cardiomyopathy: Secondary | ICD-10-CM

## 2022-10-25 LAB — ECHOCARDIOGRAM STRESS TEST
Area-P 1/2: 3.2 cm2
MV M vel: 5.4 m/s
MV Peak grad: 116.5 mmHg

## 2022-10-25 MED ORDER — MAVACAMTEN 5 MG PO CAPS: 5.0000 mg | ORAL_CAPSULE | Freq: Every day | ORAL | 0 refills | Status: DC

## 2022-10-25 NOTE — Telephone Encounter (Signed)
-----   Message from Christell Constant, MD sent at 10/25/2022  1:08 PM EDT ----- Results: Peak LVOT Gradient: 74 mm Hg LVEF : 65% Plan: Mavacamten dose 5 mg - she has no new medications, is still feeling SOB - echos at 4 and 12 weeks   Christell Constant, MD

## 2022-10-25 NOTE — Telephone Encounter (Signed)
5 mg script sent to [facility] pharmacy on file.  PSF updated on [facility] portal.

## 2022-10-25 NOTE — Telephone Encounter (Signed)
The patient has been notified of the result and verbalized understanding.  All questions (if any) were answered. Macie Burows, RN 10/25/2022 5:54 PM   Advised pt pharmacy will send out mavacamten 5 mg.  Asked that pt call or send a my chart message when receives 5 mg dose.

## 2022-10-31 NOTE — Telephone Encounter (Signed)
Called [facility] to inquire why mavacamten 5 mg has not been mailed out to pt.  Was told an email was sent to pt on 10/26/22 with no response.  I asked why a called was not attempted as this is a dose change pt has no medication on hand and should be expedited per [facility].  Representative had no answer for this question.  RN called pt advised to call [facility].  Reports will call today.

## 2022-11-11 ENCOUNTER — Other Ambulatory Visit: Payer: Self-pay | Admitting: Internal Medicine

## 2022-11-26 ENCOUNTER — Telehealth: Payer: Self-pay | Admitting: Internal Medicine

## 2022-11-26 NOTE — Telephone Encounter (Signed)
Called pt who reports has about 5 pills of Camzyos left.  Echo is scheduled for 11/29/22.  Advised I will see if MD can read echo on 11/29/22 so that med can be called in and expedited.   Pt reports has camzyos 2.5 mg left if needed.  Advised pt will ask MD if this is okay.

## 2022-11-26 NOTE — Telephone Encounter (Signed)
Pt was returning nurse call and would like a callback. Please advise.  

## 2022-11-26 NOTE — Telephone Encounter (Signed)
*  STAT* If patient is at the pharmacy, call can be transferred to refill team.   1. Which medications need to be refilled? (please list name of each medication and dose if known) mavacamten 5 MG CAPS  2. Which pharmacy/location (including street and city if local pharmacy) is medication to be sent to? [facility] - [facility], [place] - [address]  3. Do they need a 30 day or 90 day supply?  30 day supply

## 2022-11-26 NOTE — Telephone Encounter (Signed)
Left a message to call back.

## 2022-11-29 ENCOUNTER — Telehealth: Payer: Self-pay | Admitting: Internal Medicine

## 2022-11-29 ENCOUNTER — Ambulatory Visit (HOSPITAL_COMMUNITY): Payer: Medicare HMO | Attending: Internal Medicine

## 2022-11-29 DIAGNOSIS — I517 Cardiomegaly: Secondary | ICD-10-CM | POA: Diagnosis not present

## 2022-11-29 DIAGNOSIS — I421 Obstructive hypertrophic cardiomyopathy: Secondary | ICD-10-CM | POA: Diagnosis not present

## 2022-11-29 LAB — ECHOCARDIOGRAM LIMITED
Area-P 1/2: 4.21 cm2
S' Lateral: 2.4 cm

## 2022-11-29 MED ORDER — MAVACAMTEN 5 MG PO CAPS: 5.0000 mg | ORAL_CAPSULE | Freq: Every day | ORAL | 2 refills | Status: DC

## 2022-11-29 NOTE — Telephone Encounter (Signed)
New message   Patient had echo today.  She said she is taking mavacamten 5mg  daily.  She has 2 pills left. However, she still has lots of 2.5mg  of the mavacamten.  Patient want to know that when she runs out of the 5mg  pills, can she take  two 2.5mg  pills until she hear from Dr Izora Ribas on what to do next?  Please call her and let her know.   Thanks

## 2022-11-29 NOTE — Addendum Note (Signed)
Addended by: Macie Burows on: 11/29/2022 05:11 PM   Modules accepted: Orders

## 2022-11-29 NOTE — Telephone Encounter (Signed)
From MD: I read the study and we talked about the results: Gradient 4; LVEF 70 planning to stay of 5 mg   [facility] portal updated, refill of camzyos 5 mg PO QD sent to specialty pharmacy on file.

## 2022-11-29 NOTE — Telephone Encounter (Signed)
Spoke with MD reports I suppose- in reality I don't know. But physiologically it should be fine   Called pt advised of this.  Advised will update the portal once MD has read echo. No further concerns voiced at this time.

## 2022-12-12 ENCOUNTER — Other Ambulatory Visit: Payer: Self-pay | Admitting: Internal Medicine

## 2023-01-01 ENCOUNTER — Other Ambulatory Visit: Payer: Self-pay | Admitting: Internal Medicine

## 2023-01-16 DIAGNOSIS — R9389 Abnormal findings on diagnostic imaging of other specified body structures: Secondary | ICD-10-CM | POA: Diagnosis not present

## 2023-01-16 DIAGNOSIS — D251 Intramural leiomyoma of uterus: Secondary | ICD-10-CM | POA: Diagnosis not present

## 2023-01-16 DIAGNOSIS — Z79810 Long term (current) use of selective estrogen receptor modulators (SERMs): Secondary | ICD-10-CM | POA: Diagnosis not present

## 2023-01-17 ENCOUNTER — Ambulatory Visit: Payer: Medicare HMO | Attending: Genetic Counselor | Admitting: Genetic Counselor

## 2023-01-17 ENCOUNTER — Ambulatory Visit (HOSPITAL_BASED_OUTPATIENT_CLINIC_OR_DEPARTMENT_OTHER): Payer: Medicare HMO

## 2023-01-17 ENCOUNTER — Encounter: Payer: Medicare HMO | Admitting: Genetic Counselor

## 2023-01-17 DIAGNOSIS — I421 Obstructive hypertrophic cardiomyopathy: Secondary | ICD-10-CM

## 2023-01-20 NOTE — Progress Notes (Signed)
Referring Provider: Riley Lam, MD  Pre Test Genetic Consult  Referral Reason  Mary Walter, a new patient at the [facility] clinic, is referred for genetic consult and testing of hypertrophic cardiomyopathy.  Personal Medical Information Mary Walter (III.2 on pedigree) is a 67 year old Caucasian woman who was noted to have a heart murmur in 2018 at age 68. In 2021 she had an abnormal EKG at her annual physical and was sent to the hospital where she underwent further cardiac imaging studies that detected sever asymmetric septal hypertrophy of the basal segment with no scar burden and LVEF of 73%.   She reports having dyspnea, fatigue, chest discomfort that was worse when she was planting flowers at the [place]. She also reports occasional heart palpitations but denies syncope or dizziness   Family history  Relation to Proband Pedigree # Current age Heart condition/age of onset Notes  Daughter IV.3 44 Chest pain for several years Echo/EKG not done  Son IV.4 41 None Echo/EKG not done  Grandkids V.1-V.3 20, 18, 17 None         Brothers, 3 III.1, III.3, III.4 71, 65, 64 III.1- Heart issues- details? III.3- M.I. @ 50, stent Echo/EKG not done  Sister III.5 60  None Echo/EKG not done  Nephews, nieces IV.1-IV.2, IV.5-IV.8 30S-40S None         Father II.4 Deceased HCM @ 18 Died @ 60 from HCM  Paternal aunts II.1-II.3 Deceased None Died of old age @ 65-90s  Paternal grandfather I.1 Deceased None Died @ 75-pancreatic cancer  Paternal grandmother I.2 Deceased None Died @ 88-poor health        Mother II.5 Deceased 5 vessel CABG @ 76 Died of sepsis from surgery for stomach ulcers @ 45  Maternal uncle II.6 Deceased None Died of M.I. @ 61  Maternal aunts, 2 II.7, II.8 Deceased None Died of old age @ 48, 20  Maternal grandfather I.3 Deceased None Died of old age @ 20  Maternal grandmother I.4 Deceased None Died of gall bladder inflammation @ 60    Genetics Mary Walter was counseled on the genetics  of hypertrophic cardiomyopathy (HCM). I explained to the patient that this is an autosomal dominant condition with incomplete penetrance i.e. not all individuals harboring the HCM mutation will present clinically with HCM, and age-related penetrance where clinical presentation of HCM increases with advanced age. Variability in clinical expression is also seen in families with HCM with affected family members presenting clinically at different ages and with symptoms ranging from mild to severe.  Since HCM is an autosomal dominant condition, first degree-relatives should seek regular surveillance for HCM.  First-degree relatives, include her daughter, son and siblings. Clinical screening of first-degree relatives involves echocardiogram and EKG at regular intervals, frequency is typically determined by age, with those in their teens undergoing screening every year until the age of 61 and those over the age of 30 getting screened every 3-5 years. Patient verbalized understanding of this.  I informed the patient that about 8-10% of HCM patients can have compound and digenic mutations for HCM. Also briefly discussed the inheritance pattern and treatment /management plans for the infiltrative cardiomyopathies that present as HCM phenocopies.   Genetic testing is a probabilistic test dependent upon age and severity of presentation, presence of risk factors for HCM and importantly family history of HCM or sudden death in first-degree relatives.   If a mutation is not identified, then all first-degree relatives should undergo regular screening for HCM. I emphasized that even if  the genetic test is negative, it does not mean that he does not have HCM. A negative test result can be due to limitations of the genetic test.   There is also the likelihood of identifying a "Variant of unknown significance". This result means that the variant has not been detected in a statistically significant number of HCM patients  and/or functional studies have not been performed to verify its pathogenicity. This VUS can be tested in the family to see if it segregates with disease. If a VUS is found, first-degree relatives should undergo regular clinical screening for HCM.  If a pathogenic variant is reported, then first-degree family members can get tested for this variant. If they test positive, it is likely they will develop HCM. In light of variable expression and incomplete penetrance associated with HCM, it is not possible to predict when they will manifest clinically with HCM. It is recommended that family members that test positive for the familial pathogenic variant pursue clinical screening for HCM. Family members that test negative for the familial mutation need not pursue periodic screening for HCM, but seek care if symptoms develop.   Impression  Mary Walter was found to have cardiac wall thickness suggestive of HCM at age 85 in the absence of other loading conditions such as uncontrolled HTN that can cause cardiac hypertrophy. In addition, she reports HCM in her father making it highly likely that she inherited this condition from her father. HCM being an autosomal dominant condition, her children and siblings are at a 50% risk of inheriting it.   Genetic testing for HCM is recommended. This test should include the sarcomeric genes, namely MYBPC3, MYH7, TNNI3, TNNT2, TPM1, ACTC1, MYL2, MYL3 implicated in HCM as well as the genes for the HCM phenocopies, such as Fabry disease (GLA), Danon disease (LAMP2), WPW syndrome (PRKAG2) and familial transthyretin amyloidosis (TTR) and phospholamban (PLN)  In addition, we discussed the protections afforded by the Genetic Information Non-Discrimination Act (GINA). I explained to the patient that GINA protects them from losing their employment or health insurance based on their genotype. However, these protections do not cover life insurance and disability. Patient verbalized  understanding of this and states that her daughter has life insurance but think her son may have it through work.  Please note that the patient has not been counseled in this visit on other personal, cultural, or ethical issues that they may face due to their heart condition.   Plan After a thorough discussion of the risk and benefits of genetic testing for HCM, Mary Walter declines HCM genetic testing due to insurance non-coverage. She will inform her son, daughter and siblings to undergo regular screening for HCM.    Sidney Ace, Ph.D, [facility] Clinical Molecular Geneticist

## 2023-01-21 ENCOUNTER — Telehealth: Payer: Self-pay | Admitting: Internal Medicine

## 2023-01-21 DIAGNOSIS — E782 Mixed hyperlipidemia: Secondary | ICD-10-CM

## 2023-01-21 NOTE — Telephone Encounter (Signed)
Orders placed for labs

## 2023-01-21 NOTE — Telephone Encounter (Signed)
Patient need lab orders.

## 2023-01-22 ENCOUNTER — Encounter (INDEPENDENT_AMBULATORY_CARE_PROVIDER_SITE_OTHER): Payer: Self-pay

## 2023-01-24 ENCOUNTER — Other Ambulatory Visit (HOSPITAL_COMMUNITY): Payer: Medicare HMO

## 2023-01-28 ENCOUNTER — Other Ambulatory Visit (INDEPENDENT_AMBULATORY_CARE_PROVIDER_SITE_OTHER): Payer: Medicare HMO

## 2023-01-28 DIAGNOSIS — E782 Mixed hyperlipidemia: Secondary | ICD-10-CM

## 2023-01-28 LAB — HEPATIC FUNCTION PANEL
ALT: 39 U/L — ABNORMAL HIGH (ref 0–35)
AST: 29 U/L (ref 0–37)
Albumin: 3.8 g/dL (ref 3.5–5.2)
Alkaline Phosphatase: 69 U/L (ref 39–117)
Bilirubin, Direct: 0.1 mg/dL (ref 0.0–0.3)
Total Bilirubin: 0.7 mg/dL (ref 0.2–1.2)
Total Protein: 6.2 g/dL (ref 6.0–8.3)

## 2023-01-28 LAB — BASIC METABOLIC PANEL
BUN: 11 mg/dL (ref 6–23)
CO2: 26 mEq/L (ref 19–32)
Calcium: 10.3 mg/dL (ref 8.4–10.5)
Chloride: 109 mEq/L (ref 96–112)
Creatinine, Ser: 0.84 mg/dL (ref 0.40–1.20)
GFR: 71.52 mL/min (ref >60.00)
Glucose, Bld: 97 mg/dL (ref 70–99)
Potassium: 4 mEq/L (ref 3.5–5.1)
Sodium: 142 mEq/L (ref 135–145)

## 2023-01-28 LAB — LIPID PANEL
Cholesterol: 108 mg/dL (ref 0–200)
HDL: 37.9 mg/dL — ABNORMAL LOW (ref >39.00)
LDL Cholesterol: 39 mg/dL (ref 0–99)
NonHDL: 70.04
Total CHOL/HDL Ratio: 3
Triglycerides: 155 mg/dL — ABNORMAL HIGH (ref 0.0–149.0)
VLDL: 31 mg/dL (ref 0.0–40.0)

## 2023-01-30 ENCOUNTER — Ambulatory Visit: Payer: Medicare HMO | Admitting: Internal Medicine

## 2023-01-30 VITALS — BP 128/70 | HR 76 | Temp 97.9°F | Resp 16 | Ht 65.0 in | Wt 181.0 lb

## 2023-01-30 DIAGNOSIS — F439 Reaction to severe stress, unspecified: Secondary | ICD-10-CM | POA: Diagnosis not present

## 2023-01-30 DIAGNOSIS — D0512 Intraductal carcinoma in situ of left breast: Secondary | ICD-10-CM | POA: Diagnosis not present

## 2023-01-30 DIAGNOSIS — R319 Hematuria, unspecified: Secondary | ICD-10-CM

## 2023-01-30 DIAGNOSIS — I421 Obstructive hypertrophic cardiomyopathy: Secondary | ICD-10-CM

## 2023-01-30 DIAGNOSIS — M79672 Pain in left foot: Secondary | ICD-10-CM

## 2023-01-30 DIAGNOSIS — D696 Thrombocytopenia, unspecified: Secondary | ICD-10-CM | POA: Diagnosis not present

## 2023-01-30 DIAGNOSIS — E782 Mixed hyperlipidemia: Secondary | ICD-10-CM | POA: Diagnosis not present

## 2023-01-30 DIAGNOSIS — I25118 Atherosclerotic heart disease of native coronary artery with other forms of angina pectoris: Secondary | ICD-10-CM

## 2023-01-30 DIAGNOSIS — R9389 Abnormal findings on diagnostic imaging of other specified body structures: Secondary | ICD-10-CM

## 2023-01-30 DIAGNOSIS — R7989 Other specified abnormal findings of blood chemistry: Secondary | ICD-10-CM | POA: Diagnosis not present

## 2023-01-30 DIAGNOSIS — I10 Essential (primary) hypertension: Secondary | ICD-10-CM | POA: Diagnosis not present

## 2023-01-30 NOTE — Progress Notes (Signed)
Subjective:    [name], female    DOB: November 30, 1955, 67 y.o.   MRN: [id number]  Patient here for  Chief Complaint  Patient presents with   Medical Management of Chronic Issues    HPI Here for follow up - Saw cardiology 08/20/22 - f/u HOCM. Had f/u stress echo and echo - 10/24/22. Continues on metoprolol and mavacamten. Recommended continuing on 5mg  dose. Has f/u echo planned for 01/17/23. Saw gyn 3/1/124 - endometrial thickening. Attempted endometrial biopsy - unsuccessful. Elected to f/u pelvic ultrasound in 3 months. Had f/u 01/16/23 - endometrial stripe is 6.34mm compared to 7.17mm on last measurement. Recommended f/u ultrasound in 6 months.  Also saw Dr Tonna Boehringer 08/21/22 - f/u DCIS left breast. On tamoxifen. Has had increased pain - left medial foot to left great toe.  No known injury to that area.  Did step on a piece of wood - left ball of foot.  Is better.  Discussed supports. Discussed further evaluation.  Wants to monitor.    Past Medical History:  Diagnosis Date   Breast cancer (HCC)    Chest pain    Dyspnea    Family history of adverse reaction to anesthesia    SON-LUNGS FILLED UP WITH FLUID AFTER APPENDECTOMY   Hematuria    HOCM (hypertrophic obstructive cardiomyopathy) (HCC)    a. 10/2019 Echo: EF 70-75%, mod LVH. No rwma. Mild SAM, mild LVOT obs(peak grad 20 @ rest 26 w/ Valsalva); b. 01/2022 cMRI: EF 73%, sev asymm LV basal septal hypertrophy- basal segment 1.6cm. Transmural LGE/scar in LV basal segment (point of max thickness & RV insertion). SAM->mild MR & LVOT obs-->HOCM.   Hypercholesterolemia    Hypertension    Nonobstructive CAD (coronary artery disease)    a.10/2015 MV: No ischemia/infarct; b. 11/2019 Cor CTA: mild nonobs LAD dzs; c. 12/2021 Cor CTA: Ca2+ = 216 (87th%'ile). LM nl, LAD 25-49p, LCX nondom, nl, RCA <25ost.   Palpitations    a. 12/2021 Zio: RSR @ 72 (52-182). 3 runs NSVT (longest 5 beats, fastest 152 x 4 beats). 10 SVT runs (longest 15.1 secs @  103, fastest 4 beats @ 182). Rare PACs/PVCs.   Personal history of radiation therapy    Pulmonary nodule    a. 12/2021 CT chest: stable 4mm RLL pulm nodule, likely benign, No f/u imaging req.   Systolic murmur    Past Surgical History:  Procedure Laterality Date   BREAST BIOPSY Left 10/27/2019   Affirm bx-distortion-     clip-path ADH   BREAST BIOPSY Left 01/07/2020   Affim bx 1 area/ x clip and ribbon clip/ ADH   BREAST LUMPECTOMY     BREAST LUMPECTOMY WITH NEEDLE LOCALIZATION Left 02/14/2020   Procedure: BREAST LUMPECTOMY WITH NEEDLE LOCALIZATION;  Surgeon: Earline Mayotte, MD;  Location: [facility] ORS;  Service: General;  Laterality: Left;   CHOLECYSTECTOMY  10/2020   COLONOSCOPY WITH PROPOFOL N/A 10/18/2022   Procedure: COLONOSCOPY WITH PROPOFOL;  Surgeon: Regis Bill, MD;  Location: [facility] ENDOSCOPY;  Service: Endoscopy;  Laterality: N/A;   FACIAL FRACTURE SURGERY     X 2   Family History  Problem Relation Age of Onset   Diabetes Mother    Heart disease Mother    Hypertension Mother    Cancer Father        prostate    Diverticulitis Father    CVA Father    Heart disease Father    Stroke Father    Hypertension Father  Heart murmur Father    Breast cancer Sister    Heart attack Brother    CVA Paternal Grandmother    Breast cancer Cousin    Social History   Socioeconomic History   Marital status: Married    Spouse name: Not on file   Number of children: 2   Years of education: Not on file   Highest education level: Not on file  Occupational History   Not on file  Tobacco Use   Smoking status: Never   Smokeless tobacco: Never  Vaping Use   Vaping status: Never Used  Substance and Sexual Activity   Alcohol use: No    Alcohol/week: 0.0 standard drinks of alcohol   Drug use: No   Sexual activity: Not Currently  Other Topics Concern   Not on file  Social History Narrative   Not on file   Social Determinants of Health   Financial Resource Strain: Low  Risk  (02/01/2022)   Overall Financial Resource Strain (CARDIA)    Difficulty of Paying Living Expenses: Not hard at all  Food Insecurity: No Food Insecurity (02/01/2022)   Hunger Vital Sign    Worried About Running Out of Food in the Last Year: Never true    Ran Out of Food in the Last Year: Never true  Transportation Needs: No Transportation Needs (02/01/2022)   PRAPARE - Administrator, Civil Service (Medical): No    Lack of Transportation (Non-Medical): No  Physical Activity: Not on file  Stress: No Stress Concern Present (02/01/2022)   [employer] of Occupational Health - Occupational Stress Questionnaire    Feeling of Stress : Not at all  Social Connections: Not on file     Review of Systems  Constitutional:  Negative for appetite change and unexpected weight change.  HENT:  Negative for congestion and sinus pressure.   Respiratory:  Negative for cough and chest tightness.        Breathing stable.    Cardiovascular:  Negative for chest pain, palpitations and leg swelling.  Gastrointestinal:  Negative for abdominal pain, diarrhea, nausea and vomiting.  Genitourinary:  Negative for difficulty urinating and dysuria.  Musculoskeletal:  Negative for joint swelling and myalgias.  Skin:  Negative for color change and rash.  Neurological:  Negative for dizziness and headaches.  Psychiatric/Behavioral:  Negative for agitation and dysphoric mood.        Objective:     BP 128/70   Pulse 76   Temp 97.9 F (36.6 C)   Resp 16   Ht 5\' 5"  (1.651 m)   Wt 181 lb (82.1 kg)   LMP 06/24/2010   SpO2 98%   BMI 30.12 kg/m  Wt Readings from Last 3 Encounters:  01/30/23 181 lb (82.1 kg)  10/18/22 176 lb (79.8 kg)  09/30/22 175 lb 6.4 oz (79.6 kg)    Physical Exam Vitals reviewed.  Constitutional:      General: She is not in acute distress.    Appearance: Normal appearance.  HENT:     Head: Normocephalic and atraumatic.     Right Ear: External ear normal.      Left Ear: External ear normal.  Eyes:     General: No scleral icterus.       Right eye: No discharge.        Left eye: No discharge.     Conjunctiva/sclera: Conjunctivae normal.  Neck:     Thyroid: No thyromegaly.  Cardiovascular:     Rate and  Rhythm: Normal rate and regular rhythm.  Pulmonary:     Effort: No respiratory distress.     Breath sounds: Normal breath sounds. No wheezing.  Abdominal:     General: Bowel sounds are normal.     Palpations: Abdomen is soft.     Tenderness: There is no abdominal tenderness.  Musculoskeletal:        General: No swelling or tenderness.     Cervical back: Neck supple. No tenderness.  Lymphadenopathy:     Cervical: No cervical adenopathy.  Skin:    Findings: No erythema or rash.  Neurological:     Mental Status: She is alert.  Psychiatric:        Mood and Affect: Mood normal.        Behavior: Behavior normal.      Outpatient Encounter Medications as of 01/30/2023  Medication Sig   aspirin EC 81 MG EC tablet Take 1 tablet (81 mg total) by mouth daily.   atorvastatin (LIPITOR) 40 MG tablet Take 1 tablet by mouth once daily   CALCIUM-MAGNESIUM-ZINC PO Take by mouth daily at 6 (six) AM.   mavacamten 5 MG CAPS Take 5 mg by mouth daily.   metoprolol succinate (TOPROL-XL) 100 MG 24 hr tablet Take 1 tablet (100 mg total) by mouth daily. Take with or immediately following a meal.   Multiple Vitamin (MULTIVITAMIN) tablet Take 1 tablet by mouth daily.   sertraline (ZOLOFT) 50 MG tablet Take 1 tablet by mouth once daily   tamoxifen (NOLVADEX) 20 MG tablet Take 20 mg by mouth daily.   tobramycin-dexamethasone (TOBRADEX) ophthalmic ointment Place 1 Application into the left eye 2 (two) times daily.   Vitamin D, Cholecalciferol, 10 MCG (400 UNIT) CAPS Take by mouth.   No facility-administered encounter medications on file as of 01/30/2023.     Lab Results  Component Value Date   WBC 7.4 09/26/2022   HGB 14.7 09/26/2022   HCT 43.6 09/26/2022    PLT 152.0 09/26/2022   GLUCOSE 97 01/28/2023   CHOL 108 01/28/2023   TRIG 155.0 (H) 01/28/2023   HDL 37.90 (L) 01/28/2023   LDLDIRECT 57 01/19/2020   LDLCALC 39 01/28/2023   ALT 39 (H) 01/28/2023   AST 29 01/28/2023   NA 142 01/28/2023   K 4.0 01/28/2023   CL 109 01/28/2023   CREATININE 0.84 01/28/2023   BUN 11 01/28/2023   CO2 26 01/28/2023   TSH 1.75 09/26/2022   INR 1.0 11/19/2019   HGBA1C 5.2 03/30/2013       Assessment & Plan:  Abnormal liver function test Assessment & Plan: Recent lab revealed a slightly elevated ALT.  Discussed.  Recheck liver panel in several weeks.  Diet and exercise.   Orders: -     Hepatic function panel; Future  Coronary artery disease of native artery of native heart with stable angina pectoris [facility]) Assessment & Plan: Previously evaluated by cardiology for chest pain with minimal exertion.  To review, recommended f/u CTA and cardiac monitor.  Monitor - Paroxysmal SVTs, 3 short runs of VT lasting 4 beats noted. Overall no significant/persistent arrhythmias. CTA - Stable 4 mm pulmonary nodule in the right lower lobe. This is stable since June 2021 and is likely benign. No follow-up imaging recommended. Coronary calcium score of 216.  Calcified plaque - proximal LAD mild stenosis - 35-49%.  Minimal stenosis - ostial RCA - mild non obstructive CAD.  Recommended continuing aspirin, lipitor and toprol.  MRCM - findings c/w hypertrophic obstructive cardiomyopathy.  Felt better with increase in toprol.  Saw Dr Izora Ribas for evaluation of hypertrophic cardiomyopathy - to assess - started mavacamten.  Feels better.  SOB has improved. Had f/u echo and stress echo 10/24/22.  Currently stable.    Ductal carcinoma in situ (DCIS) of left breast Assessment & Plan: S/p excision and XRT.  Now followed by Dr Tonna Boehringer. Saw Dr Lemar Livings - 08/28/21 - tamoxifen for 5 years total.(dx 01/2020)  F/u one year diagnostic mammogram.    Mammogram 07/2022 - Birads II>    Endometrial  thickening on ultrasound Assessment & Plan: . Saw gyn 3/1/124 - endometrial thickening. Attempted endometrial biopsy - unsuccessful. Elected to f/u pelvic ultrasound in 3 months. Had f/u 01/16/23 - endometrial stripe is 6.39mm compared to 7.29mm on last measurement. Recommended f/u ultrasound in 6 months.    Essential hypertension Assessment & Plan: Continue metoprolol.  Blood pressure as outlined.  No changes in medication.  Follow pressures.  Follow metabolic panel.    Hematuria, unspecified type Assessment & Plan: Evaluated by urology - 02/28/21 - cystoscopy - normal.  Follow urine. CT urogram if hematuria worsens or if develops gross hematuria.  F/u two years.    HOCM (hypertrophic obstructive cardiomyopathy) (HCC) Assessment & Plan: 11/29/22 f/u ECHO - LVOT gradient 4 mm Hg. LVEF 70% . Continue mavacamten 5 mg    Mixed hyperlipidemia Assessment & Plan: Low cholesterol diet and exercise.  Continue lipitor.  Follow lipid panel and liver function tests.   Lab Results  Component Value Date   CHOL 108 01/28/2023   HDL 37.90 (L) 01/28/2023   LDLCALC 39 01/28/2023   LDLDIRECT 57 01/19/2020   TRIG 155.0 (H) 01/28/2023   CHOLHDL 3 01/28/2023     Thrombocytopenia (HCC) Assessment & Plan: Follow cbc.    Stress Assessment & Plan: Continues on zoloft.  Overall appears to be handling things well.  Does not feel needs any further intervention.  Follow.    Left foot pain Assessment & Plan: Pain as outlined.  Discussed supports.  Desires no further evaluation at this time.  Wants to monitor.       Dale Traill, MD

## 2023-02-02 ENCOUNTER — Encounter: Payer: Self-pay | Admitting: Internal Medicine

## 2023-02-02 DIAGNOSIS — M79672 Pain in left foot: Secondary | ICD-10-CM | POA: Insufficient documentation

## 2023-02-02 NOTE — Assessment & Plan Note (Signed)
Pain as outlined.  Discussed supports.  Desires no further evaluation at this time.  Wants to monitor.

## 2023-02-02 NOTE — Assessment & Plan Note (Signed)
Evaluated by urology - 02/28/21 - cystoscopy - normal.  Follow urine. CT urogram if hematuria worsens or if develops gross hematuria.  F/u two years.

## 2023-02-02 NOTE — Assessment & Plan Note (Signed)
Low cholesterol diet and exercise.  Continue lipitor.  Follow lipid panel and liver function tests.   Lab Results  Component Value Date   CHOL 108 01/28/2023   HDL 37.90 (L) 01/28/2023   LDLCALC 39 01/28/2023   LDLDIRECT 57 01/19/2020   TRIG 155.0 (H) 01/28/2023   CHOLHDL 3 01/28/2023

## 2023-02-02 NOTE — Assessment & Plan Note (Signed)
S/p excision and XRT.  Now followed by Dr Tonna Boehringer. Saw Dr Lemar Livings - 08/28/21 - tamoxifen for 5 years total.(dx 01/2020)  F/u one year diagnostic mammogram.    Mammogram 07/2022 - Birads II>

## 2023-02-02 NOTE — Assessment & Plan Note (Signed)
Follow cbc.  

## 2023-02-02 NOTE — Assessment & Plan Note (Signed)
11/29/22 f/u ECHO - LVOT gradient 4 mm Hg. LVEF 70% . Continue mavacamten 5 mg

## 2023-02-02 NOTE — Assessment & Plan Note (Signed)
Continues on zoloft.  Overall appears to be handling things well.  Does not feel needs any further intervention.  Follow.  

## 2023-02-02 NOTE — Assessment & Plan Note (Signed)
.   Saw gyn 3/1/124 - endometrial thickening. Attempted endometrial biopsy - unsuccessful. Elected to f/u pelvic ultrasound in 3 months. Had f/u 01/16/23 - endometrial stripe is 6.26mm compared to 7.74mm on last measurement. Recommended f/u ultrasound in 6 months.

## 2023-02-02 NOTE — Assessment & Plan Note (Signed)
Continue metoprolol.  Blood pressure as outlined.  No changes in medication.  Follow pressures.  Follow metabolic panel.  

## 2023-02-02 NOTE — Assessment & Plan Note (Signed)
Previously evaluated by cardiology for chest pain with minimal exertion.  To review, recommended f/u CTA and cardiac monitor.  Monitor - Paroxysmal SVTs, 3 short runs of VT lasting 4 beats noted. Overall no significant/persistent arrhythmias. CTA - Stable 4 mm pulmonary nodule in the right lower lobe. This is stable since June 2021 and is likely benign. No follow-up imaging recommended. Coronary calcium score of 216.  Calcified plaque - proximal LAD mild stenosis - 35-49%.  Minimal stenosis - ostial RCA - mild non obstructive CAD.  Recommended continuing aspirin, lipitor and toprol.  MRCM - findings c/w hypertrophic obstructive cardiomyopathy.  Felt better with increase in toprol.  Saw Dr Izora Ribas for evaluation of hypertrophic cardiomyopathy - to assess - started mavacamten.  Feels better.  SOB has improved. Had f/u echo and stress echo 10/24/22.  Currently stable.

## 2023-02-02 NOTE — Assessment & Plan Note (Signed)
Recent lab revealed a slightly elevated ALT.  Discussed.  Recheck liver panel in several weeks.  Diet and exercise.

## 2023-02-04 ENCOUNTER — Other Ambulatory Visit: Payer: Self-pay

## 2023-02-04 DIAGNOSIS — I421 Obstructive hypertrophic cardiomyopathy: Secondary | ICD-10-CM

## 2023-02-04 NOTE — Progress Notes (Signed)
Ordered limited echo for pt 12 wk Camzyos f/u.  Due 10/18-10/26.

## 2023-02-05 ENCOUNTER — Other Ambulatory Visit: Payer: Self-pay | Admitting: Internal Medicine

## 2023-02-05 ENCOUNTER — Encounter (HOSPITAL_COMMUNITY): Payer: Self-pay

## 2023-02-07 ENCOUNTER — Ambulatory Visit: Payer: Medicare HMO | Admitting: *Deleted

## 2023-02-07 VITALS — Ht 65.0 in | Wt 180.0 lb

## 2023-02-07 DIAGNOSIS — Z0000 Encounter for general adult medical examination without abnormal findings: Secondary | ICD-10-CM

## 2023-02-07 NOTE — Progress Notes (Signed)
Subjective:   Mary Walter is a 67 y.o. female who presents for Medicare Annual (Subsequent) preventive examination.  Visit Complete: Virtual  I connected with  Mary Walter on 02/07/23 by a audio enabled telemedicine application and verified that I am speaking with the correct person using two identifiers.  Patient Location: Home  Provider Location: Home Office  I discussed the limitations of evaluation and management by telemedicine. The patient expressed understanding and agreed to proceed.  Vital Signs: Unable to obtain new vitals due to this being a telehealth visit.   Review of Systems     Cardiac Risk Factors include: advanced age (>61men, >46 women);dyslipidemia;hypertension;Other (see comment), Risk factor comments: murmur     Objective:    Today's Vitals   02/07/23 1042  Weight: 180 lb (81.6 kg)  Height: 5\' 5"  (1.651 m)   Body mass index is 29.95 kg/m.     02/07/2023   10:57 AM 10/18/2022    1:35 PM 06/13/2022   10:29 AM 02/01/2022   11:22 AM 05/03/2021   10:52 AM 11/08/2020   12:21 PM 11/07/2020   11:21 AM  Advanced Directives  Does Patient Have a Medical Advance Directive? No No No No No No No  Would patient like information on creating a medical advance directive? No - Patient declined No - Patient declined No - Patient declined No - Patient declined No - Patient declined No - Patient declined No - Patient declined    Current Medications (verified) Outpatient Encounter Medications as of 02/07/2023  Medication Sig   aspirin EC 81 MG EC tablet Take 1 tablet (81 mg total) by mouth daily.   atorvastatin (LIPITOR) 40 MG tablet Take 1 tablet by mouth once daily   lactobacillus acidophilus (BACID) TABS tablet Take 2 tablets by mouth daily.   magnesium gluconate (MAGONATE) 500 MG tablet Take 500 mg by mouth every other day.   mavacamten 5 MG CAPS Take 5 mg by mouth daily.   metoprolol succinate (TOPROL-XL) 100 MG 24 hr tablet Take 1 tablet (100 mg  total) by mouth daily. Take with or immediately following a meal.   Multiple Vitamin (MULTIVITAMIN) tablet Take 1 tablet by mouth daily.   sertraline (ZOLOFT) 50 MG tablet Take 1 tablet by mouth once daily   tamoxifen (NOLVADEX) 20 MG tablet Take 20 mg by mouth daily.   tobramycin-dexamethasone (TOBRADEX) ophthalmic ointment Place 1 Application into the left eye 2 (two) times daily.   Vitamin D, Cholecalciferol, 10 MCG (400 UNIT) CAPS Take by mouth.   zinc gluconate 50 MG tablet Take 50 mg by mouth every other day.   CALCIUM-MAGNESIUM-ZINC PO Take by mouth daily at 6 (six) AM. (Patient not taking: Reported on 02/07/2023)   No facility-administered encounter medications on file as of 02/07/2023.    Allergies (verified) Macrobid [nitrofurantoin macrocrystal] and Penicillins   History: Past Medical History:  Diagnosis Date   Breast cancer (HCC)    Chest pain    Dyspnea    Family history of adverse reaction to anesthesia    SON-LUNGS FILLED UP WITH FLUID AFTER APPENDECTOMY   Hematuria    HOCM (hypertrophic obstructive cardiomyopathy) (HCC)    a. 10/2019 Echo: EF 70-75%, mod LVH. No rwma. Mild SAM, mild LVOT obs(peak grad 20 @ rest 26 w/ Valsalva); b. 01/2022 cMRI: EF 73%, sev asymm LV basal septal hypertrophy- basal segment 1.6cm. Transmural LGE/scar in LV basal segment (point of max thickness & RV insertion). SAM->mild MR & LVOT obs-->HOCM.  Hypercholesterolemia    Hypertension    Nonobstructive CAD (coronary artery disease)    a.10/2015 MV: No ischemia/infarct; b. 11/2019 Cor CTA: mild nonobs LAD dzs; c. 12/2021 Cor CTA: Ca2+ = 216 (87th%'ile). LM nl, LAD 25-49p, LCX nondom, nl, RCA <25ost.   Palpitations    a. 12/2021 Zio: RSR @ 72 (52-182). 3 runs NSVT (longest 5 beats, fastest 152 x 4 beats). 10 SVT runs (longest 15.1 secs @ 103, fastest 4 beats @ 182). Rare PACs/PVCs.   Personal history of radiation therapy    Pulmonary nodule    a. 12/2021 CT chest: stable 4mm RLL pulm nodule, likely  benign, No f/u imaging req.   Systolic murmur    Past Surgical History:  Procedure Laterality Date   BREAST BIOPSY Left 10/27/2019   Affirm bx-distortion-     clip-path ADH   BREAST BIOPSY Left 01/07/2020   Affim bx 1 area/ x clip and ribbon clip/ ADH   BREAST LUMPECTOMY     BREAST LUMPECTOMY WITH NEEDLE LOCALIZATION Left 02/14/2020   Procedure: BREAST LUMPECTOMY WITH NEEDLE LOCALIZATION;  Surgeon: Earline Mayotte, MD;  Location: [facility] ORS;  Service: General;  Laterality: Left;   CHOLECYSTECTOMY  10/2020   COLONOSCOPY WITH PROPOFOL N/A 10/18/2022   Procedure: COLONOSCOPY WITH PROPOFOL;  Surgeon: Regis Bill, MD;  Location: [facility] END[facility]COPY;  Service: Endoscopy;  Laterality: N/A;   FACIAL FRACTURE SURGERY     X 2   Family History  Problem Relation Age of Onset   Diabetes Mother    Heart disease Mother    Hypertension Mother    Cancer Father        prostate    Diverticulitis Father    CVA Father    Heart disease Father    Stroke Father    Hypertension Father    Heart murmur Father    Breast cancer Sister    Heart attack Brother    CVA Paternal Grandmother    Breast cancer Cousin    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  (02/07/2023)   Overall Financial Resource Strain (CARDIA)    Difficulty of Paying Living Expenses: Not hard at all  Food Insecurity: No Food  Insecurity (02/07/2023)   Hunger Vital Sign    Worried About Running Out of Food in the Last Year: Never true    Ran Out of Food in the Last Year: Never true  Transportation Needs: No Transportation Needs (02/07/2023)   PRAPARE - Administrator, Civil Service (Medical): No    Lack of Transportation (Non-Medical): No  Physical Activity: Inactive (02/07/2023)   Exercise Vital Sign    Days of Exercise per Week: 0 days    Minutes of Exercise per Session: 0 min  Stress: No Stress Concern Present (02/07/2023)   [employer] of Occupational Health - Occupational Stress Questionnaire    Feeling of Stress : Only a little  Social Connections: Socially Integrated (02/07/2023)   Social Connection and Isolation Panel [NHANES]    Frequency of Communication with Friends and Family: More than three times a week    Frequency of Social Gatherings with Friends and Family: More than three times a week    Attends Religious Services: More than 4 times per year    Active Member of [employer] or Organizations: Yes    Attends Banker Meetings: Never    Marital Status: Married    Tobacco Counseling Counseling given: Not Answered   Clinical Intake:  Pre-visit preparation completed: Yes  Pain : No/denies pain     BMI - recorded: 29.95 Nutritional Status: BMI 25 -29 Overweight Nutritional Risks: None Diabetes: No  How often do you need to have someone help you when you read instructions, pamphlets, or other written materials from your doctor or pharmacy?: 1 - Never  Interpreter Needed?: No  Information entered by :: R. Bethzaida Boord LPN   Activities of Daily Living    02/07/2023   10:43 AM  In your present state of health, do you have any difficulty performing the following activities:  Hearing? 1  Comment wears aids, left ear [place] worse  Vision? 0  Comment readers  Difficulty concentrating or making decisions? 0  Walking or climbing stairs? 0  Dressing or bathing? 0  Doing  errands, shopping? 0  Preparing Food and eating ? N  Using the Toilet? N  In the past six months, have you accidently leaked urine? N  Do you have problems with loss of bowel control? N  Managing your Medications? N  Managing your Finances? N  Housekeeping or managing your Housekeeping? N    Patient Care Team: Dale Lutz, MD as PCP - General (Internal Medicine) Antonieta Iba, MD as PCP - Cardiology (Cardiology) Carmina Miller, MD as Radiation Oncologist (Radiation Oncology) Lemar Livings Merrily Pew, MD as Consulting Physician (General Surgery)  Indicate any recent Medical Services you may have received from other than [facility] providers in the past year (date may be approximate).     Assessment:   This is a routine wellness examination for Ann.  Hearing/Vision screen Hearing Screening - Comments:: Wears aids Vision Screening - Comments:: readers  Dietary issues and exercise activities discussed:     Goals Addressed             This Visit's Progress    Patient Stated       Would like to exercise more, wants to walk more, Wants to lose weight       Depression Screen    02/07/2023   10:51 AM 05/29/2022    9:23 AM 02/22/2022   10:36 AM 02/01/2022   11:21 AM 01/08/2022    9:29 AM 09/24/2021   10:27 AM 05/24/2021    9:25 AM  PHQ 2/9 Scores  PHQ - 2 Score 0 0 0 0 0 0 0  PHQ- 9 Score 1          Fall Risk    02/07/2023   10:46 AM 05/29/2022    9:23 AM 02/22/2022   10:36 AM 02/01/2022   11:24 AM 01/08/2022    9:29 AM  Fall Risk   Falls in the past year? 0 0 0 0 0  Number falls in past yr: 0 0 0 0   Injury  with Fall? 0 0 0    Risk for fall due to : No Fall Risks No Fall Risks No Fall Risks  No Fall Risks  Follow up Falls prevention discussed;Falls evaluation completed Falls evaluation completed Falls evaluation completed Falls evaluation completed Falls evaluation completed    MEDICARE RISK AT HOME:  Medicare Risk at Home - 02/07/23 1046     Any stairs in or around  the home? Yes    If so, are there any without handrails? No    Home free of loose throw rugs in walkways, pet beds, electrical cords, etc? Yes    Adequate lighting in your home to reduce risk of falls? Yes    Life alert? No    Use of a cane, walker or w/c? No    Grab bars in the bathroom? No    Shower chair or bench in shower? Yes    Elevated toilet seat or a handicapped toilet? No              Cognitive Function:        02/07/2023   10:58 AM  6CIT Screen  What Year? 0 points  What month? 0 points  What time? 0 points  Count back from 20 0 points  Months in reverse 0 points  Repeat phrase 0 points  Total Score 0 points    Immunizations Immunization History  Administered Date(s) Administered   Influenza Split 05/31/2013   Influenza, High Dose Seasonal PF 04/20/2021, 05/15/2022   Influenza, Quadrivalent, Recombinant, Inj, Pf 04/02/2019   Influenza,inj,Quad PF,6+ Mos 03/08/2014   Influenza,inj,quad, With Preservative 04/22/2017   Influenza-Unspecified 02/24/2016, 04/03/2018, 06/12/2020   PFIZER(Purple Top)SARS-COV-2 Vaccination 09/14/2019, 10/05/2019, 08/11/2020   PNEUMOCOCCAL CONJUGATE-20 01/11/2021   Zoster, Live 02/24/2016    TDAP status: Due, Education has been provided regarding the importance of this vaccine. Advised may receive this vaccine at local pharmacy or Health Dept. Aware to provide a copy of the vaccination record if obtained from local pharmacy or Health Dept. Verbalized acceptance and understanding.  Flu Vaccine status: Due, Education has been provided regarding the importance of this vaccine. Advised may receive this vaccine at local pharmacy or Health Dept. Aware to provide a copy of the vaccination record if obtained from local pharmacy or Health Dept. Verbalized acceptance and understanding.  Pneumococcal vaccine status: Up to date  Covid-19 vaccine status: Completed vaccines  Qualifies for Shingles Vaccine? Yes   Zostavax completed Yes    Shingrix Completed?: No.    Education has been provided regarding the importance of this vaccine. Patient has been advised to call insurance company to determine out of pocket expense if they have not yet received this vaccine. Advised may also receive vaccine at local pharmacy or Health Dept. Verbalized acceptance and understanding.  Screening Tests Health Maintenance  Topic Date Due   DTaP/Tdap/Td (1 - Tdap) Never done   Medicare Annual Wellness (AWV)  02/02/2023   COVID-19 Vaccine (4 - 2023-24 season) 02/15/2023 (Originally 02/22/2022)   Zoster Vaccines- Shingrix (1 of 2) 05/02/2023 (Originally 11/20/1974)   INFLUENZA VACCINE  09/22/2023 (Originally 01/23/2023)   MAMMOGRAM  08/16/2023   Colonoscopy  10/17/2032   Pneumonia Vaccine 79+ Years old  Completed   DEXA SCAN  Completed   Hepatitis C Screening  Completed   HPV VACCINES  Aged Out    Health Maintenance  Health Maintenance Due  Topic Date Due   DTaP/Tdap/Td (1 - Tdap) Never done   Medicare Annual Wellness (AWV)  02/02/2023  Colorectal cancer screening: Type of screening: Colonoscopy. Completed 4/24. Repeat every 10 years  Mammogram status: Completed 2/24. Repeat every year  Bone Density status: Completed 2/24. Results reflect: Bone density results: OSTEOPENIA. Repeat every 2 years.  Lung Cancer Screening: (Low Dose CT Chest recommended if Age 62-80 years, 20 pack-year currently smoking OR have quit w/in 15years.) does not qualify.   Additional Screening:  Hepatitis C Screening: does qualify; Completed 4/23  Vision Screening: Recommended annual ophthalmology exams for early detection of glaucoma and other disorders of the eye. Is the patient up to date with their annual eye exam?  Yes  Who is the provider or what is the name of the office in which the patient attends annual eye exams? [facility] If pt is not established with a provider, would they like to be referred to a provider to establish care? No .   Dental  Screening: Recommended annual dental exams for proper oral hygiene   Community Resource Referral / Chronic Care Management: CRR required this visit?  No   CCM required this visit?  No     Plan:     I have personally reviewed and noted the following in the patient's chart:   Medical and social history Use of alcohol, tobacco or illicit drugs  Current medications and supplements including opioid prescriptions. Patient is not currently taking opioid prescriptions. Functional ability and status Nutritional status Physical activity Advanced directives List of other physicians Hospitalizations, surgeries, and ER visits in previous 12 months Vitals Screenings to include cognitive, depression, and falls Referrals and appointments  In addition, I have reviewed and discussed with patient certain preventive protocols, quality metrics, and best practice recommendations. A written personalized care plan for preventive services as well as general preventive health recommendations were provided to patient.     Sydell Axon, LPN   [id number]   After Visit Summary: (MyChart) Due to this being a telephonic visit, the after visit summary with patients personalized plan was offered to patient via MyChart   Nurse Notes: None

## 2023-02-07 NOTE — Patient Instructions (Signed)
Mary Walter , Thank you for taking time to come for your Medicare Wellness Visit. I appreciate your ongoing commitment to your health goals. Please review the following plan we discussed and let me know if I can assist you in the future.   Referrals/Orders/Follow-Ups/Clinician Recommendations: None  This is a list of the screening recommended for you and due dates:  Health Maintenance  Topic Date Due   DTaP/Tdap/Td vaccine (1 - Tdap) Never done   COVID-19 Vaccine (4 - 2023-24 season) 02/15/2023*   Zoster (Shingles) Vaccine (1 of 2) 05/02/2023*   Flu Shot  09/22/2023*   Mammogram  08/16/2023   Medicare Annual Wellness Visit  02/07/2024   Colon Cancer Screening  10/17/2032   Pneumonia Vaccine  Completed   DEXA scan (bone density measurement)  Completed   Hepatitis C Screening  Completed   HPV Vaccine  Aged Out  *Topic was postponed. The date shown is not the original due date.    Advanced directives: (Declined) Advance directive discussed with you today. Even though you declined this today, please call our office should you change your mind, and we can give you the proper paperwork for you to fill out. Will pick paperwork up at the office and complete  Next Medicare Annual Wellness Visit scheduled for next year: Yes 02/13/24 @ 9:00  Preventive Care 65 Years and Older, Female Preventive care refers to lifestyle choices and visits with your health care provider that can promote health and wellness. What does preventive care include? A yearly physical exam. This is also called an annual well check. Dental exams once or twice a year. Routine eye exams. Ask your health care provider how often you should have your eyes checked. Personal lifestyle choices, including: Daily care of your teeth and gums. Regular physical activity. Eating a healthy diet. Avoiding tobacco and drug use. Limiting alcohol use. Practicing safe sex. Taking low-dose aspirin every day. Taking vitamin and mineral  supplements as recommended by your health care provider. What happens during an annual well check? The services and screenings done by your health care provider during your annual well check will depend on your age, overall health, lifestyle risk factors, and family history of disease. Counseling  Your health care provider may ask you questions about your: Alcohol use. Tobacco use. Drug use. Emotional well-being. Home and relationship well-being. Sexual activity. Eating habits. History of falls. Memory and ability to understand (cognition). Work and work Astronomer. Reproductive health. Screening  You may have the following tests or measurements: Height, weight, and BMI. Blood pressure. Lipid and cholesterol levels. These may be checked every 5 years, or more frequently if you are over 77 years old. Skin check. Lung cancer screening. You may have this screening every year starting at age 49 if you have a 30-pack-year history of smoking and currently smoke or have quit within the past 15 years. Fecal occult blood test (FOBT) of the stool. You may have this test every year starting at age 23. Flexible sigmoidoscopy or colonoscopy. You may have a sigmoidoscopy every 5 years or a colonoscopy every 10 years starting at age 12. Hepatitis C blood test. Hepatitis B blood test. Sexually transmitted disease (STD) testing. Diabetes screening. This is done by checking your blood sugar (glucose) after you have not eaten for a while (fasting). You may have this done every 1-3 years. Bone density scan. This is done to screen for osteoporosis. You may have this done starting at age 11. Mammogram. This may be done every  1-2 years. Talk to your health care provider about how often you should have regular mammograms. Talk with your health care provider about your test results, treatment options, and if necessary, the need for more tests. Vaccines  Your health care provider may recommend certain  vaccines, such as: Influenza vaccine. This is recommended every year. Tetanus, diphtheria, and acellular pertussis (Tdap, Td) vaccine. You may need a Td booster every 10 years. Zoster vaccine. You may need this after age 53. Pneumococcal 13-valent conjugate (PCV13) vaccine. One dose is recommended after age 28. Pneumococcal polysaccharide (PPSV23) vaccine. One dose is recommended after age 27. Talk to your health care provider about which screenings and vaccines you need and how often you need them. This information is not intended to replace advice given to you by your health care provider. Make sure you discuss any questions you have with your health care provider. Document Released: 07/07/2015 Document Revised: 02/28/2016 Document Reviewed: 04/11/2015 Elsevier Interactive Patient Education  2017 [employer].  Fall Prevention in the Home Falls can cause injuries. They can happen to people of all ages. There are many things you can do to make your home safe and to help prevent falls. What can I do on the outside of my home? Regularly fix the edges of walkways and driveways and fix any cracks. Remove anything that might make you trip as you walk through a door, such as a raised step or threshold. Trim any bushes or trees on the path to your home. Use bright outdoor lighting. Clear any walking paths of anything that might make someone trip, such as rocks or tools. Regularly check to see if handrails are loose or broken. Make sure that both sides of any steps have handrails. Any raised decks and porches should have guardrails on the edges. Have any leaves, snow, or ice cleared regularly. Use sand or salt on walking paths during winter. Clean up any spills in your garage right away. This includes oil or grease spills. What can I do in the bathroom? Use night lights. Install grab bars by the toilet and in the tub and shower. Do not use towel bars as grab bars. Use non-skid mats or decals in  the tub or shower. If you need to sit down in the shower, use a plastic, non-slip stool. Keep the floor dry. Clean up any water that spills on the floor as soon as it happens. Remove soap buildup in the tub or shower regularly. Attach bath mats securely with double-sided non-slip rug tape. Do not have throw rugs and other things on the floor that can make you trip. What can I do in the bedroom? Use night lights. Make sure that you have a light by your bed that is easy to reach. Do not use any sheets or blankets that are too big for your bed. They should not hang down onto the floor. Have a firm chair that has side arms. You can use this for support while you get dressed. Do not have throw rugs and other things on the floor that can make you trip. What can I do in the kitchen? Clean up any spills right away. Avoid walking on wet floors. Keep items that you use a lot in easy-to-reach places. If you need to reach something above you, use a strong step stool that has a grab bar. Keep electrical cords out of the way. Do not use floor polish or wax that makes floors slippery. If you must use wax, use non-skid  floor wax. Do not have throw rugs and other things on the floor that can make you trip. What can I do with my stairs? Do not leave any items on the stairs. Make sure that there are handrails on both sides of the stairs and use them. Fix handrails that are broken or loose. Make sure that handrails are as long as the stairways. Check any carpeting to make sure that it is firmly attached to the stairs. Fix any carpet that is loose or worn. Avoid having throw rugs at the top or bottom of the stairs. If you do have throw rugs, attach them to the floor with carpet tape. Make sure that you have a light switch at the top of the stairs and the bottom of the stairs. If you do not have them, ask someone to add them for you. What else can I do to help prevent falls? Wear shoes that: Do not have high  heels. Have rubber bottoms. Are comfortable and fit you well. Are closed at the toe. Do not wear sandals. If you use a stepladder: Make sure that it is fully opened. Do not climb a closed stepladder. Make sure that both sides of the stepladder are locked into place. Ask someone to hold it for you, if possible. Clearly mark and make sure that you can see: Any grab bars or handrails. First and last steps. Where the edge of each step is. Use tools that help you move around (mobility aids) if they are needed. These include: Canes. Walkers. Scooters. Crutches. Turn on the lights when you go into a dark area. Replace any light bulbs as soon as they burn out. Set up your furniture so you have a clear path. Avoid moving your furniture around. If any of your floors are uneven, fix them. If there are any pets around you, be aware of where they are. Review your medicines with your doctor. Some medicines can make you feel dizzy. This can increase your chance of falling. Ask your doctor what other things that you can do to help prevent falls. This information is not intended to replace advice given to you by your health care provider. Make sure you discuss any questions you have with your health care provider. Document Released: 04/06/2009 Document Revised: 11/16/2015 Document Reviewed: 07/15/2014 Elsevier Interactive Patient Education  2017 [employer].

## 2023-02-10 NOTE — Telephone Encounter (Signed)
Authorized refill of mavacamten 5 mg PO every day.

## 2023-02-19 ENCOUNTER — Other Ambulatory Visit (INDEPENDENT_AMBULATORY_CARE_PROVIDER_SITE_OTHER): Payer: Medicare HMO

## 2023-02-19 DIAGNOSIS — R7989 Other specified abnormal findings of blood chemistry: Secondary | ICD-10-CM | POA: Diagnosis not present

## 2023-02-19 LAB — HEPATIC FUNCTION PANEL
ALT: 39 U/L — ABNORMAL HIGH (ref 0–35)
AST: 29 U/L (ref 0–37)
Albumin: 3.7 g/dL (ref 3.5–5.2)
Alkaline Phosphatase: 76 U/L (ref 39–117)
Bilirubin, Direct: 0.2 mg/dL (ref 0.0–0.3)
Total Bilirubin: 0.7 mg/dL (ref 0.2–1.2)
Total Protein: 6 g/dL (ref 6.0–8.3)

## 2023-02-21 ENCOUNTER — Telehealth: Payer: Self-pay

## 2023-02-21 NOTE — Telephone Encounter (Signed)
-----   Message from [facility] sent at 02/21/2023  5:06 AM EDT ----- Please call and notify - one liver test is slightly elevated (very slight) and stable from last check.  Remainder of liver panel wnl.  Has been worked up previously.  Previous fibrous scan - mild fatty liver.  Continue diet and exercise.  We will follow.

## 2023-02-21 NOTE — Telephone Encounter (Signed)
LMTCB in regards to lab results.  

## 2023-03-14 ENCOUNTER — Encounter: Payer: Self-pay | Admitting: Internal Medicine

## 2023-03-14 ENCOUNTER — Ambulatory Visit: Payer: Medicare HMO | Attending: Internal Medicine | Admitting: Internal Medicine

## 2023-03-14 VITALS — BP 120/84 | HR 70 | Ht 65.0 in | Wt 183.4 lb

## 2023-03-14 DIAGNOSIS — I421 Obstructive hypertrophic cardiomyopathy: Secondary | ICD-10-CM | POA: Diagnosis not present

## 2023-03-14 DIAGNOSIS — E782 Mixed hyperlipidemia: Secondary | ICD-10-CM | POA: Diagnosis not present

## 2023-03-14 DIAGNOSIS — I25118 Atherosclerotic heart disease of native coronary artery with other forms of angina pectoris: Secondary | ICD-10-CM

## 2023-03-14 DIAGNOSIS — I10 Essential (primary) hypertension: Secondary | ICD-10-CM

## 2023-03-14 DIAGNOSIS — I2510 Atherosclerotic heart disease of native coronary artery without angina pectoris: Secondary | ICD-10-CM | POA: Diagnosis not present

## 2023-03-14 DIAGNOSIS — R9431 Abnormal electrocardiogram [ECG] [EKG]: Secondary | ICD-10-CM | POA: Diagnosis not present

## 2023-03-14 NOTE — Patient Instructions (Signed)
Medication Instructions:   Your physician recommends that you continue on your current medications as directed. Please refer to the Current Medication list given to you today.  *If you need a refill on your cardiac medications before your next appointment, please call your pharmacy*    Testing/Procedures:  PROCEED WITH YOUR SCHEDULED ECHO ON 04/17/23 AT 11:35 AM    Follow-Up: At [name], you and your health needs are our priority.  As part of our continuing mission to provide you with exceptional heart care, we have created designated Provider Care Teams.  These Care Teams include your primary Cardiologist (physician) and Advanced Practice Providers (APPs -  Physician Assistants and Nurse Practitioners) who all work together to provide you with the care you need, when you need it.  We recommend signing up for the patient portal called "MyChart".  Sign up information is provided on this After Visit Summary.  MyChart is used to connect with patients for Virtual Visits (Telemedicine).  Patients are able to view lab/test results, encounter notes, upcoming appointments, etc.  Non-urgent messages can be sent to your provider as well.   To learn more about what you can do with MyChart, go to [email or web address].    Your next appointment:   1 year(s)  Provider:   DR. CHANDRASEKHAR FOR HOCM FOLLOW-UP

## 2023-03-14 NOTE — Progress Notes (Signed)
F                  HR:           72 bpm. Exam Location:  [address]  Procedure: Stress Echo, Cardiac Doppler and Limited Color Doppler  Indications:    I42.2 HOCM  History:        Patient has prior history of Echocardiogram examinations, most recent 07/31/2022. Palpitations.  Sonographer:    Cathie Beams RCS Referring Phys: [id number] [facility] A Izora Ribas   Sonographer Comments: DPR signed on 3.11.14 appointing Katrina Stack (Spouse). May leave message on home machine and work voicemail jrt \\[email or web address]  IMPRESSIONS   1. Study for for evaluation of inducible LVOT gradient. 2. Prior to procedure, LVEF 65-70%. Normal RV function. Mild mitral regurgitation. Mild tricuspid regurgitation. Pseudonormal diastology. Peak resting gradient estimated at 23 mm Hg with significant artifact. 3. At peak stress horizonal ST depressions in an inferolateral distribution. Peak LVO gradient 74 mm Hg. MR signal not well evaluated. No exercise induced hypotension. 4. In recovery, LVOT gradient recovered to 22 mm Hg. 5. Study is suggestive of a severe, inducible LVOT gradient. 6. This is an inconclusive stress echocardiogram for ischemia. 7. This is an indeterminate risk study.  FINDINGS  Exam Protocol:   Patient Performance: The patient exercised for 4 minutes and 27 seconds, achieving 6.3 METS. The heart rate at peak stress was 131 bpm. The target heart rate was calculated to be 129 bpm. The percentage of maximum predicted heart rate achieved was 86.1 %. The baseline blood pressure was 137/72 mmHg. The blood pressure at peak stress was 138/56 mmHg. The patient developed fatigue during the stress  exam.  EKG: Resting EKG showed normal sinus rhythm. The patient developed No PVCs during exercise.   2D Echo Findings: Baseline regional wall motion abnormalities were not present. This is an inconclusive stress echocardiogram for ischemia.   Riley Lam MD Electronically signed on 10/25/2022 at 7:59:56 AM     Final   ECHOCARDIOGRAM  ECHOCARDIOGRAM LIMITED 01/17/2023  Narrative ECHOCARDIOGRAM LIMITED REPORT    Patient Name:   Mary Walter Date of Exam: 01/17/2023 Medical Rec #:  [id number]          Height:       65.0 in Accession #:    [id number]         Weight:       176.0 lb Date of Birth:  [birth year]          BSA:          1.874 m Patient Age:    67 years           BP:           118/70 mmHg Patient Gender: F                  HR:           62 bpm. Exam Location:  [address]  Procedure: Limited Echo, Cardiac Doppler, Limited Color Doppler, 3D Echo and Strain Analysis  Indications:    I42.2 hypertrophic obstructive cardiomyopathy - 5mg  Mevacamten  History:        Patient has prior history of Echocardiogram examinations, most recent 11/29/2022. CAD, Signs/Symptoms:Chest Pain, Shortness of Breath and Murmur; Risk Factors:Hypertension, Dyslipidemia and Family History of Coronary Artery Disease.  Sonographer:    Cathie Beams RCS Referring Phys: [id number] [facility] A Jason Hauge  IMPRESSIONS   1. Mild intracavitary gradient. Peak velocity 0.86 m/s. Peak gradient 3 mmHg.  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  (02/07/2023)   Overall Financial Resource Strain (CARDIA)     Difficulty of Paying Living Expenses: Not hard at all  Food Insecurity: No Food Insecurity (02/07/2023)   Hunger Vital Sign    Worried About Running Out of Food in the Last Year: Never true    Ran Out of Food in the Last Year: Never true  Transportation Needs: No Transportation Needs (02/07/2023)   PRAPARE - Administrator, Civil Service (Medical): No    Lack of Transportation (Non-Medical): No  Physical Activity: Inactive (02/07/2023)   Exercise Vital Sign    Days of Exercise per Week: 0 days    Minutes of Exercise per Session: 0 min  Stress: No Stress Concern Present (02/07/2023)   [employer] of Occupational Health - Occupational Stress Questionnaire    Feeling of Stress : Only a little  Social Connections: Socially Integrated (02/07/2023)   Social Connection and Isolation Panel [NHANES]    Frequency of Communication with Friends and Family: More than three times a week    Frequency of Social Gatherings with Friends and Family: More than three times a week    Attends Religious Services: More than 4 times per year    Active Member of [employer] or Organizations: Yes    Attends Banker Meetings: Never    Marital Status: Married     Family History: The patient's family history includes Breast cancer in her cousin and sister; CVA in her father and paternal grandmother; Cancer in her father; Diabetes in her mother; Diverticulitis in her father; Heart attack in her brother; Heart disease in her father and mother; Heart murmur in her father; Hypertension in her father and mother; Stroke in her father.  ROS:   Please see the history of present illness.     EKGs/Labs/Other Studies Reviewed:    The following studies were reviewed today:  Cardiac Studies & Procedures     STRESS TESTS  ECHOCARDIOGRAM STRESS TEST 10/24/2022  Narrative EXERCISE STRESS ECHO REPORT   --------------------------------------------------------------------------------  Patient  Name:   Mary Walter Date of Exam: 10/24/2022 Medical Rec #:  [id number]          Height:       65.0 in Accession #:    [id number]         Weight:       176.0 lb Date of Birth:  [birth year]          BSA:          1.874 m Patient Age:    66 years           BP:           137/72 mmHg Patient Gender:  F                  HR:           72 bpm. Exam Location:  [address]  Procedure: Stress Echo, Cardiac Doppler and Limited Color Doppler  Indications:    I42.2 HOCM  History:        Patient has prior history of Echocardiogram examinations, most recent 07/31/2022. Palpitations.  Sonographer:    Cathie Beams RCS Referring Phys: [id number] [facility] A Izora Ribas   Sonographer Comments: DPR signed on 3.11.14 appointing Katrina Stack (Spouse). May leave message on home machine and work voicemail jrt [email or web address]  IMPRESSIONS   1. Study for for evaluation of inducible LVOT gradient. 2. Prior to procedure, LVEF 65-70%. Normal RV function. Mild mitral regurgitation. Mild tricuspid regurgitation. Pseudonormal diastology. Peak resting gradient estimated at 23 mm Hg with significant artifact. 3. At peak stress horizonal ST depressions in an inferolateral distribution. Peak LVO gradient 74 mm Hg. MR signal not well evaluated. No exercise induced hypotension. 4. In recovery, LVOT gradient recovered to 22 mm Hg. 5. Study is suggestive of a severe, inducible LVOT gradient. 6. This is an inconclusive stress echocardiogram for ischemia. 7. This is an indeterminate risk study.  FINDINGS  Exam Protocol:   Patient Performance: The patient exercised for 4 minutes and 27 seconds, achieving 6.3 METS. The heart rate at peak stress was 131 bpm. The target heart rate was calculated to be 129 bpm. The percentage of maximum predicted heart rate achieved was 86.1 %. The baseline blood pressure was 137/72 mmHg. The blood pressure at peak stress was 138/56 mmHg. The patient developed fatigue during the stress  exam.  EKG: Resting EKG showed normal sinus rhythm. The patient developed No PVCs during exercise.   2D Echo Findings: Baseline regional wall motion abnormalities were not present. This is an inconclusive stress echocardiogram for ischemia.   Riley Lam MD Electronically signed on 10/25/2022 at 7:59:56 AM     Final   ECHOCARDIOGRAM  ECHOCARDIOGRAM LIMITED 01/17/2023  Narrative ECHOCARDIOGRAM LIMITED REPORT    Patient Name:   Mary Walter Date of Exam: 01/17/2023 Medical Rec #:  [id number]          Height:       65.0 in Accession #:    [id number]         Weight:       176.0 lb Date of Birth:  [birth year]          BSA:          1.874 m Patient Age:    67 years           BP:           118/70 mmHg Patient Gender: F                  HR:           62 bpm. Exam Location:  [address]  Procedure: Limited Echo, Cardiac Doppler, Limited Color Doppler, 3D Echo and Strain Analysis  Indications:    I42.2 hypertrophic obstructive cardiomyopathy - 5mg  Mevacamten  History:        Patient has prior history of Echocardiogram examinations, most recent 11/29/2022. CAD, Signs/Symptoms:Chest Pain, Shortness of Breath and Murmur; Risk Factors:Hypertension, Dyslipidemia and Family History of Coronary Artery Disease.  Sonographer:    Cathie Beams RCS Referring Phys: [id number] [facility] A Jason Hauge  IMPRESSIONS   1. Mild intracavitary gradient. Peak velocity 0.86 m/s. Peak gradient 3 mmHg.  F                  HR:           72 bpm. Exam Location:  [address]  Procedure: Stress Echo, Cardiac Doppler and Limited Color Doppler  Indications:    I42.2 HOCM  History:        Patient has prior history of Echocardiogram examinations, most recent 07/31/2022. Palpitations.  Sonographer:    Cathie Beams RCS Referring Phys: [id number] [facility] A Izora Ribas   Sonographer Comments: DPR signed on 3.11.14 appointing Katrina Stack (Spouse). May leave message on home machine and work voicemail jrt \\[email or web address]  IMPRESSIONS   1. Study for for evaluation of inducible LVOT gradient. 2. Prior to procedure, LVEF 65-70%. Normal RV function. Mild mitral regurgitation. Mild tricuspid regurgitation. Pseudonormal diastology. Peak resting gradient estimated at 23 mm Hg with significant artifact. 3. At peak stress horizonal ST depressions in an inferolateral distribution. Peak LVO gradient 74 mm Hg. MR signal not well evaluated. No exercise induced hypotension. 4. In recovery, LVOT gradient recovered to 22 mm Hg. 5. Study is suggestive of a severe, inducible LVOT gradient. 6. This is an inconclusive stress echocardiogram for ischemia. 7. This is an indeterminate risk study.  FINDINGS  Exam Protocol:   Patient Performance: The patient exercised for 4 minutes and 27 seconds, achieving 6.3 METS. The heart rate at peak stress was 131 bpm. The target heart rate was calculated to be 129 bpm. The percentage of maximum predicted heart rate achieved was 86.1 %. The baseline blood pressure was 137/72 mmHg. The blood pressure at peak stress was 138/56 mmHg. The patient developed fatigue during the stress  exam.  EKG: Resting EKG showed normal sinus rhythm. The patient developed No PVCs during exercise.   2D Echo Findings: Baseline regional wall motion abnormalities were not present. This is an inconclusive stress echocardiogram for ischemia.   Riley Lam MD Electronically signed on 10/25/2022 at 7:59:56 AM     Final   ECHOCARDIOGRAM  ECHOCARDIOGRAM LIMITED 01/17/2023  Narrative ECHOCARDIOGRAM LIMITED REPORT    Patient Name:   Mary Walter Date of Exam: 01/17/2023 Medical Rec #:  [id number]          Height:       65.0 in Accession #:    [id number]         Weight:       176.0 lb Date of Birth:  [birth year]          BSA:          1.874 m Patient Age:    67 years           BP:           118/70 mmHg Patient Gender: F                  HR:           62 bpm. Exam Location:  [address]  Procedure: Limited Echo, Cardiac Doppler, Limited Color Doppler, 3D Echo and Strain Analysis  Indications:    I42.2 hypertrophic obstructive cardiomyopathy - 5mg  Mevacamten  History:        Patient has prior history of Echocardiogram examinations, most recent 11/29/2022. CAD, Signs/Symptoms:Chest Pain, Shortness of Breath and Murmur; Risk Factors:Hypertension, Dyslipidemia and Family History of Coronary Artery Disease.  Sonographer:    Cathie Beams RCS Referring Phys: [id number] [facility] A Jason Hauge  IMPRESSIONS   1. Mild intracavitary gradient. Peak velocity 0.86 m/s. Peak gradient 3 mmHg.  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  (02/07/2023)   Overall Financial Resource Strain (CARDIA)     Difficulty of Paying Living Expenses: Not hard at all  Food Insecurity: No Food Insecurity (02/07/2023)   Hunger Vital Sign    Worried About Running Out of Food in the Last Year: Never true    Ran Out of Food in the Last Year: Never true  Transportation Needs: No Transportation Needs (02/07/2023)   PRAPARE - Administrator, Civil Service (Medical): No    Lack of Transportation (Non-Medical): No  Physical Activity: Inactive (02/07/2023)   Exercise Vital Sign    Days of Exercise per Week: 0 days    Minutes of Exercise per Session: 0 min  Stress: No Stress Concern Present (02/07/2023)   [employer] of Occupational Health - Occupational Stress Questionnaire    Feeling of Stress : Only a little  Social Connections: Socially Integrated (02/07/2023)   Social Connection and Isolation Panel [NHANES]    Frequency of Communication with Friends and Family: More than three times a week    Frequency of Social Gatherings with Friends and Family: More than three times a week    Attends Religious Services: More than 4 times per year    Active Member of [employer] or Organizations: Yes    Attends Banker Meetings: Never    Marital Status: Married     Family History: The patient's family history includes Breast cancer in her cousin and sister; CVA in her father and paternal grandmother; Cancer in her father; Diabetes in her mother; Diverticulitis in her father; Heart attack in her brother; Heart disease in her father and mother; Heart murmur in her father; Hypertension in her father and mother; Stroke in her father.  ROS:   Please see the history of present illness.     EKGs/Labs/Other Studies Reviewed:    The following studies were reviewed today:  Cardiac Studies & Procedures     STRESS TESTS  ECHOCARDIOGRAM STRESS TEST 10/24/2022  Narrative EXERCISE STRESS ECHO REPORT   --------------------------------------------------------------------------------  Patient  Name:   Mary Walter Date of Exam: 10/24/2022 Medical Rec #:  [id number]          Height:       65.0 in Accession #:    [id number]         Weight:       176.0 lb Date of Birth:  [birth year]          BSA:          1.874 m Patient Age:    66 years           BP:           137/72 mmHg Patient Gender:  F                  HR:           72 bpm. Exam Location:  [address]  Procedure: Stress Echo, Cardiac Doppler and Limited Color Doppler  Indications:    I42.2 HOCM  History:        Patient has prior history of Echocardiogram examinations, most recent 07/31/2022. Palpitations.  Sonographer:    Cathie Beams RCS Referring Phys: [id number] [facility] A Izora Ribas   Sonographer Comments: DPR signed on 3.11.14 appointing Katrina Stack (Spouse). May leave message on home machine and work voicemail jrt [email or web address]  IMPRESSIONS   1. Study for for evaluation of inducible LVOT gradient. 2. Prior to procedure, LVEF 65-70%. Normal RV function. Mild mitral regurgitation. Mild tricuspid regurgitation. Pseudonormal diastology. Peak resting gradient estimated at 23 mm Hg with significant artifact. 3. At peak stress horizonal ST depressions in an inferolateral distribution. Peak LVO gradient 74 mm Hg. MR signal not well evaluated. No exercise induced hypotension. 4. In recovery, LVOT gradient recovered to 22 mm Hg. 5. Study is suggestive of a severe, inducible LVOT gradient. 6. This is an inconclusive stress echocardiogram for ischemia. 7. This is an indeterminate risk study.  FINDINGS  Exam Protocol:   Patient Performance: The patient exercised for 4 minutes and 27 seconds, achieving 6.3 METS. The heart rate at peak stress was 131 bpm. The target heart rate was calculated to be 129 bpm. The percentage of maximum predicted heart rate achieved was 86.1 %. The baseline blood pressure was 137/72 mmHg. The blood pressure at peak stress was 138/56 mmHg. The patient developed fatigue during the stress  exam.  EKG: Resting EKG showed normal sinus rhythm. The patient developed No PVCs during exercise.   2D Echo Findings: Baseline regional wall motion abnormalities were not present. This is an inconclusive stress echocardiogram for ischemia.   Riley Lam MD Electronically signed on 10/25/2022 at 7:59:56 AM     Final   ECHOCARDIOGRAM  ECHOCARDIOGRAM LIMITED 01/17/2023  Narrative ECHOCARDIOGRAM LIMITED REPORT    Patient Name:   Mary Walter Date of Exam: 01/17/2023 Medical Rec #:  [id number]          Height:       65.0 in Accession #:    [id number]         Weight:       176.0 lb Date of Birth:  [birth year]          BSA:          1.874 m Patient Age:    67 years           BP:           118/70 mmHg Patient Gender: F                  HR:           62 bpm. Exam Location:  [address]  Procedure: Limited Echo, Cardiac Doppler, Limited Color Doppler, 3D Echo and Strain Analysis  Indications:    I42.2 hypertrophic obstructive cardiomyopathy - 5mg  Mevacamten  History:        Patient has prior history of Echocardiogram examinations, most recent 11/29/2022. CAD, Signs/Symptoms:Chest Pain, Shortness of Breath and Murmur; Risk Factors:Hypertension, Dyslipidemia and Family History of Coronary Artery Disease.  Sonographer:    Cathie Beams RCS Referring Phys: [id number] [facility] A Jason Hauge  IMPRESSIONS   1. Mild intracavitary gradient. Peak velocity 0.86 m/s. Peak gradient 3 mmHg.  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  (02/07/2023)   Overall Financial Resource Strain (CARDIA)     Difficulty of Paying Living Expenses: Not hard at all  Food Insecurity: No Food Insecurity (02/07/2023)   Hunger Vital Sign    Worried About Running Out of Food in the Last Year: Never true    Ran Out of Food in the Last Year: Never true  Transportation Needs: No Transportation Needs (02/07/2023)   PRAPARE - Administrator, Civil Service (Medical): No    Lack of Transportation (Non-Medical): No  Physical Activity: Inactive (02/07/2023)   Exercise Vital Sign    Days of Exercise per Week: 0 days    Minutes of Exercise per Session: 0 min  Stress: No Stress Concern Present (02/07/2023)   [employer] of Occupational Health - Occupational Stress Questionnaire    Feeling of Stress : Only a little  Social Connections: Socially Integrated (02/07/2023)   Social Connection and Isolation Panel [NHANES]    Frequency of Communication with Friends and Family: More than three times a week    Frequency of Social Gatherings with Friends and Family: More than three times a week    Attends Religious Services: More than 4 times per year    Active Member of [employer] or Organizations: Yes    Attends Banker Meetings: Never    Marital Status: Married     Family History: The patient's family history includes Breast cancer in her cousin and sister; CVA in her father and paternal grandmother; Cancer in her father; Diabetes in her mother; Diverticulitis in her father; Heart attack in her brother; Heart disease in her father and mother; Heart murmur in her father; Hypertension in her father and mother; Stroke in her father.  ROS:   Please see the history of present illness.     EKGs/Labs/Other Studies Reviewed:    The following studies were reviewed today:  Cardiac Studies & Procedures     STRESS TESTS  ECHOCARDIOGRAM STRESS TEST 10/24/2022  Narrative EXERCISE STRESS ECHO REPORT   --------------------------------------------------------------------------------  Patient  Name:   Mary Walter Date of Exam: 10/24/2022 Medical Rec #:  [id number]          Height:       65.0 in Accession #:    [id number]         Weight:       176.0 lb Date of Birth:  [birth year]          BSA:          1.874 m Patient Age:    66 years           BP:           137/72 mmHg Patient Gender:  F                  HR:           72 bpm. Exam Location:  [address]  Procedure: Stress Echo, Cardiac Doppler and Limited Color Doppler  Indications:    I42.2 HOCM  History:        Patient has prior history of Echocardiogram examinations, most recent 07/31/2022. Palpitations.  Sonographer:    Cathie Beams RCS Referring Phys: [id number] [facility] A Izora Ribas   Sonographer Comments: DPR signed on 3.11.14 appointing Katrina Stack (Spouse). May leave message on home machine and work voicemail jrt [email or web address]  IMPRESSIONS   1. Study for for evaluation of inducible LVOT gradient. 2. Prior to procedure, LVEF 65-70%. Normal RV function. Mild mitral regurgitation. Mild tricuspid regurgitation. Pseudonormal diastology. Peak resting gradient estimated at 23 mm Hg with significant artifact. 3. At peak stress horizonal ST depressions in an inferolateral distribution. Peak LVO gradient 74 mm Hg. MR signal not well evaluated. No exercise induced hypotension. 4. In recovery, LVOT gradient recovered to 22 mm Hg. 5. Study is suggestive of a severe, inducible LVOT gradient. 6. This is an inconclusive stress echocardiogram for ischemia. 7. This is an indeterminate risk study.  FINDINGS  Exam Protocol:   Patient Performance: The patient exercised for 4 minutes and 27 seconds, achieving 6.3 METS. The heart rate at peak stress was 131 bpm. The target heart rate was calculated to be 129 bpm. The percentage of maximum predicted heart rate achieved was 86.1 %. The baseline blood pressure was 137/72 mmHg. The blood pressure at peak stress was 138/56 mmHg. The patient developed fatigue during the stress  exam.  EKG: Resting EKG showed normal sinus rhythm. The patient developed No PVCs during exercise.   2D Echo Findings: Baseline regional wall motion abnormalities were not present. This is an inconclusive stress echocardiogram for ischemia.   Riley Lam MD Electronically signed on 10/25/2022 at 7:59:56 AM     Final   ECHOCARDIOGRAM  ECHOCARDIOGRAM LIMITED 01/17/2023  Narrative ECHOCARDIOGRAM LIMITED REPORT    Patient Name:   Mary Walter Date of Exam: 01/17/2023 Medical Rec #:  [id number]          Height:       65.0 in Accession #:    [id number]         Weight:       176.0 lb Date of Birth:  [birth year]          BSA:          1.874 m Patient Age:    67 years           BP:           118/70 mmHg Patient Gender: F                  HR:           62 bpm. Exam Location:  [address]  Procedure: Limited Echo, Cardiac Doppler, Limited Color Doppler, 3D Echo and Strain Analysis  Indications:    I42.2 hypertrophic obstructive cardiomyopathy - 5mg  Mevacamten  History:        Patient has prior history of Echocardiogram examinations, most recent 11/29/2022. CAD, Signs/Symptoms:Chest Pain, Shortness of Breath and Murmur; Risk Factors:Hypertension, Dyslipidemia and Family History of Coronary Artery Disease.  Sonographer:    Cathie Beams RCS Referring Phys: [id number] [facility] A Jason Hauge  IMPRESSIONS   1. Mild intracavitary gradient. Peak velocity 0.86 m/s. Peak gradient 3 mmHg.  F                  HR:           72 bpm. Exam Location:  [address]  Procedure: Stress Echo, Cardiac Doppler and Limited Color Doppler  Indications:    I42.2 HOCM  History:        Patient has prior history of Echocardiogram examinations, most recent 07/31/2022. Palpitations.  Sonographer:    Cathie Beams RCS Referring Phys: [id number] [facility] A Izora Ribas   Sonographer Comments: DPR signed on 3.11.14 appointing Katrina Stack (Spouse). May leave message on home machine and work voicemail [email or web address]  IMPRESSIONS   1. Study for for evaluation of inducible LVOT gradient. 2. Prior to procedure, LVEF 65-70%. Normal RV function. Mild mitral regurgitation. Mild tricuspid regurgitation. Pseudonormal diastology. Peak resting gradient estimated at 23 mm Hg with significant artifact. 3. At peak stress horizonal ST depressions in an inferolateral distribution. Peak LVO gradient 74 mm Hg. MR signal not well evaluated. No exercise induced hypotension. 4. In recovery, LVOT gradient recovered to 22 mm Hg. 5. Study is suggestive of a severe, inducible LVOT gradient. 6. This is an inconclusive stress echocardiogram for ischemia. 7. This is an indeterminate risk study.  FINDINGS  Exam Protocol:   Patient Performance: The patient exercised for 4 minutes and 27 seconds, achieving 6.3 METS. The heart rate at peak stress was 131 bpm. The target heart rate was calculated to be 129 bpm. The percentage of maximum predicted heart rate achieved was 86.1 %. The baseline blood pressure was 137/72 mmHg. The blood pressure at peak stress was 138/56 mmHg. The patient developed fatigue during the stress  exam.  EKG: Resting EKG showed normal sinus rhythm. The patient developed No PVCs during exercise.   2D Echo Findings: Baseline regional wall motion abnormalities were not present. This is an inconclusive stress echocardiogram for ischemia.   Riley Lam MD Electronically signed on 10/25/2022 at 7:59:56 AM     Final   ECHOCARDIOGRAM  ECHOCARDIOGRAM LIMITED 01/17/2023  Narrative ECHOCARDIOGRAM LIMITED REPORT    Patient Name:   Mary Walter Date of Exam: 01/17/2023 Medical Rec #:  [id number]          Height:       65.0 in Accession #:    [id number]         Weight:       176.0 lb Date of Birth:  [birth year]          BSA:          1.874 m Patient Age:    67 years           BP:           118/70 mmHg Patient Gender: F                  HR:           62 bpm. Exam Location:  [address]  Procedure: Limited Echo, Cardiac Doppler, Limited Color Doppler, 3D Echo and Strain Analysis  Indications:    I42.2 hypertrophic obstructive cardiomyopathy - 5mg  Mevacamten  History:        Patient has prior history of Echocardiogram examinations, most recent 11/29/2022. CAD, Signs/Symptoms:Chest Pain, Shortness of Breath and Murmur; Risk Factors:Hypertension, Dyslipidemia and Family History of Coronary Artery Disease.  Sonographer:    Cathie Beams RCS Referring Phys: [id number] [facility] A Jason Hauge  IMPRESSIONS   1. Mild intracavitary gradient. Peak velocity 0.86 m/s. Peak gradient 3 mmHg.

## 2023-03-15 ENCOUNTER — Other Ambulatory Visit: Payer: Self-pay | Admitting: Internal Medicine

## 2023-03-20 ENCOUNTER — Telehealth: Payer: Self-pay | Admitting: Internal Medicine

## 2023-03-20 MED ORDER — FUROSEMIDE 20 MG PO TABS: 20.0000 mg | ORAL_TABLET | Freq: Every day | ORAL | 11 refills | Status: DC | PRN

## 2023-03-20 NOTE — Telephone Encounter (Signed)
Called pt advised of MD response: Lasix 20 mg PO PRN  for leg swelling.  If taking it frequently will get BMP   Advised pt if takes furosemide on a continuous basis to notify our office we will need to draw labs.  Pt expresses understanding.

## 2023-03-20 NOTE — Telephone Encounter (Signed)
Pt c/o medication issue:  1. Name of Medication: Fluid pill  2. How are you currently taking this medication (dosage and times per day)?   3. Are you having a reaction (difficulty breathing--STAT)?   4. What is your medication issue? Patient states that at her last appt, she discussed starting a fluid pill and states this has not been called into her pharmacy yet. Requesting call back to discuss further.

## 2023-04-07 ENCOUNTER — Other Ambulatory Visit: Payer: Self-pay | Admitting: Internal Medicine

## 2023-04-17 ENCOUNTER — Ambulatory Visit (HOSPITAL_COMMUNITY): Payer: Medicare HMO | Attending: Internal Medicine

## 2023-04-17 DIAGNOSIS — I421 Obstructive hypertrophic cardiomyopathy: Secondary | ICD-10-CM | POA: Diagnosis not present

## 2023-04-17 LAB — ECHOCARDIOGRAM LIMITED
Area-P 1/2: 3.36 cm2
MV M vel: 5.07 m/s
MV Peak grad: 102.6 mm[Hg]
S' Lateral: 2.3 cm

## 2023-04-22 ENCOUNTER — Telehealth: Payer: Self-pay

## 2023-04-22 DIAGNOSIS — I421 Obstructive hypertrophic cardiomyopathy: Secondary | ICD-10-CM

## 2023-04-22 MED ORDER — CAMZYOS 5 MG PO CAPS: 5.0000 mg | ORAL_CAPSULE | Freq: Every day | ORAL | 2 refills | Status: DC

## 2023-04-22 NOTE — Telephone Encounter (Signed)
PSF updated on [facility] portal.  Camzyos 5 mg refilled.

## 2023-04-22 NOTE — Telephone Encounter (Signed)
Placed an order for limited echo due in Jan 2025.

## 2023-04-22 NOTE — Telephone Encounter (Signed)
-----   Message from Christell Constant sent at 04/20/2023  4:09 PM EDT ----- Results: Peak LVOT Gradient: 13 LVEF : 73 Plan: Mavacamten dose 5, echo in 12 weeks If Patient has new medications or supplements, will need medication review for potential interactions   Christell Constant, MD

## 2023-05-16 DIAGNOSIS — Z6830 Body mass index (BMI) 30.0-30.9, adult: Secondary | ICD-10-CM | POA: Diagnosis not present

## 2023-05-16 DIAGNOSIS — F419 Anxiety disorder, unspecified: Secondary | ICD-10-CM | POA: Diagnosis not present

## 2023-05-16 DIAGNOSIS — E669 Obesity, unspecified: Secondary | ICD-10-CM | POA: Diagnosis not present

## 2023-05-16 DIAGNOSIS — I25119 Atherosclerotic heart disease of native coronary artery with unspecified angina pectoris: Secondary | ICD-10-CM | POA: Diagnosis not present

## 2023-05-16 DIAGNOSIS — Z8249 Family history of ischemic heart disease and other diseases of the circulatory system: Secondary | ICD-10-CM | POA: Diagnosis not present

## 2023-05-16 DIAGNOSIS — D84821 Immunodeficiency due to drugs: Secondary | ICD-10-CM | POA: Diagnosis not present

## 2023-05-16 DIAGNOSIS — C50919 Malignant neoplasm of unspecified site of unspecified female breast: Secondary | ICD-10-CM | POA: Diagnosis not present

## 2023-05-16 DIAGNOSIS — Z880 Allergy status to penicillin: Secondary | ICD-10-CM | POA: Diagnosis not present

## 2023-05-16 DIAGNOSIS — R011 Cardiac murmur, unspecified: Secondary | ICD-10-CM | POA: Diagnosis not present

## 2023-05-16 DIAGNOSIS — E785 Hyperlipidemia, unspecified: Secondary | ICD-10-CM | POA: Diagnosis not present

## 2023-05-16 DIAGNOSIS — M3500 Sicca syndrome, unspecified: Secondary | ICD-10-CM | POA: Diagnosis not present

## 2023-05-16 DIAGNOSIS — I10 Essential (primary) hypertension: Secondary | ICD-10-CM | POA: Diagnosis not present

## 2023-06-03 ENCOUNTER — Other Ambulatory Visit: Payer: Self-pay

## 2023-06-03 ENCOUNTER — Other Ambulatory Visit (INDEPENDENT_AMBULATORY_CARE_PROVIDER_SITE_OTHER): Payer: Medicare HMO

## 2023-06-03 DIAGNOSIS — E782 Mixed hyperlipidemia: Secondary | ICD-10-CM

## 2023-06-03 LAB — BASIC METABOLIC PANEL
BUN: 14 mg/dL (ref 6–23)
CO2: 26 meq/L (ref 19–32)
Calcium: 10.7 mg/dL — ABNORMAL HIGH (ref 8.4–10.5)
Chloride: 109 meq/L (ref 96–112)
Creatinine, Ser: 0.87 mg/dL (ref 0.40–1.20)
GFR: 68.41 mL/min (ref >60.00)
Glucose, Bld: 97 mg/dL (ref 70–99)
Potassium: 3.9 meq/L (ref 3.5–5.1)
Sodium: 142 meq/L (ref 135–145)

## 2023-06-03 LAB — HEPATIC FUNCTION PANEL
ALT: 37 U/L — ABNORMAL HIGH (ref 0–35)
AST: 28 U/L (ref 0–37)
Albumin: 3.8 g/dL (ref 3.5–5.2)
Alkaline Phosphatase: 80 U/L (ref 39–117)
Bilirubin, Direct: 0.1 mg/dL (ref 0.0–0.3)
Total Bilirubin: 0.7 mg/dL (ref 0.2–1.2)
Total Protein: 6.3 g/dL (ref 6.0–8.3)

## 2023-06-03 LAB — LIPID PANEL
Cholesterol: 101 mg/dL (ref 0–200)
HDL: 33.3 mg/dL — ABNORMAL LOW (ref >39.00)
LDL Cholesterol: 35 mg/dL (ref 0–99)
NonHDL: 67.73
Total CHOL/HDL Ratio: 3
Triglycerides: 165 mg/dL — ABNORMAL HIGH (ref 0.0–149.0)
VLDL: 33 mg/dL (ref 0.0–40.0)

## 2023-06-05 ENCOUNTER — Ambulatory Visit (INDEPENDENT_AMBULATORY_CARE_PROVIDER_SITE_OTHER): Payer: Medicare HMO | Admitting: Internal Medicine

## 2023-06-05 VITALS — BP 118/84 | HR 72 | Temp 97.8°F | Ht 65.0 in | Wt 183.8 lb

## 2023-06-05 DIAGNOSIS — D696 Thrombocytopenia, unspecified: Secondary | ICD-10-CM

## 2023-06-05 DIAGNOSIS — E782 Mixed hyperlipidemia: Secondary | ICD-10-CM | POA: Diagnosis not present

## 2023-06-05 DIAGNOSIS — I10 Essential (primary) hypertension: Secondary | ICD-10-CM | POA: Diagnosis not present

## 2023-06-05 DIAGNOSIS — I25118 Atherosclerotic heart disease of native coronary artery with other forms of angina pectoris: Secondary | ICD-10-CM

## 2023-06-05 DIAGNOSIS — I421 Obstructive hypertrophic cardiomyopathy: Secondary | ICD-10-CM

## 2023-06-05 DIAGNOSIS — D0512 Intraductal carcinoma in situ of left breast: Secondary | ICD-10-CM

## 2023-06-05 DIAGNOSIS — R7989 Other specified abnormal findings of blood chemistry: Secondary | ICD-10-CM

## 2023-06-05 DIAGNOSIS — R21 Rash and other nonspecific skin eruption: Secondary | ICD-10-CM

## 2023-06-05 DIAGNOSIS — R319 Hematuria, unspecified: Secondary | ICD-10-CM

## 2023-06-05 DIAGNOSIS — F439 Reaction to severe stress, unspecified: Secondary | ICD-10-CM

## 2023-06-05 DIAGNOSIS — R9389 Abnormal findings on diagnostic imaging of other specified body structures: Secondary | ICD-10-CM | POA: Diagnosis not present

## 2023-06-05 DIAGNOSIS — Z1231 Encounter for screening mammogram for malignant neoplasm of breast: Secondary | ICD-10-CM

## 2023-06-05 MED ORDER — SERTRALINE HCL 50 MG PO TABS: 50.0000 mg | ORAL_TABLET | Freq: Every day | ORAL | 1 refills | Status: DC

## 2023-06-05 MED ORDER — ATORVASTATIN CALCIUM 40 MG PO TABS: 40.0000 mg | ORAL_TABLET | Freq: Every day | ORAL | 3 refills | Status: DC

## 2023-06-05 MED ORDER — CLOTRIMAZOLE-BETAMETHASONE 1-0.05 % EX CREA: 1.0000 | TOPICAL_CREAM | Freq: Two times a day (BID) | CUTANEOUS | 0 refills | Status: DC

## 2023-06-05 MED ORDER — MUPIROCIN 2 % EX OINT: 1.0000 | TOPICAL_OINTMENT | Freq: Two times a day (BID) | CUTANEOUS | 0 refills | Status: DC

## 2023-06-05 NOTE — Progress Notes (Signed)
Subjective:    [name], female    DOB: August 09, 1955, 67 y.o.   MRN: [id number]  Patient here for  Chief Complaint  Patient presents with   Medical Management of Chronic Issues    4 month f/u Discuss face rash that she ha had for 3 months as well as wt gain    HPI Here for a scheduled follow up.  Here to follow up regarding hypercholesterolemia, hypertension and increased stress. Saw gyn 3/1/124 - endometrial thickening. Attempted endometrial biopsy - unsuccessful. Elected to f/u pelvic ultrasound in 3 months. Had f/u 01/16/23 - endometrial stripe is 6.94mm compared to 7.16mm on last measurement. Recommended f/u ultrasound in 6 months. Also saw Dr Tonna Boehringer 08/21/22 - f/u DCIS left breast. On tamoxifen. Had f/u with cardiology 03/14/23 - f/u hypertrophic cardiomyopathy. Continues on mavacamten.  Stable. ECHO 03/2023 - stable.  Recommended f/u echo in 06/2023. Overall she feels she is doing relatively well. Main concern is that of persistent facial rash. Started with a small area around her lips. Has progressed - rash involves her forehead and periorbital region and well as nose and around her nose and mouth. No itching. She has been applying neosporin, cortisone cream, noxzema daily.  Also reports a circular rash - left axilla.  Due f/u urology   Past Medical History:  Diagnosis Date   Breast cancer (HCC)    Chest pain    Dyspnea    Family history of adverse reaction to anesthesia    SON-LUNGS FILLED UP WITH FLUID AFTER APPENDECTOMY   Hematuria    HOCM (hypertrophic obstructive cardiomyopathy) (HCC)    a. 10/2019 Echo: EF 70-75%, mod LVH. No rwma. Mild SAM, mild LVOT obs(peak grad 20 @ rest 26 w/ Valsalva); b. 01/2022 cMRI: EF 73%, sev asymm LV basal septal hypertrophy- basal segment 1.6cm. Transmural LGE/scar in LV basal segment (point of max thickness & RV insertion). SAM->mild MR & LVOT obs-->HOCM.   Hypercholesterolemia    Hypertension    Nonobstructive CAD (coronary artery  disease)    a.10/2015 MV: No ischemia/infarct; b. 11/2019 Cor CTA: mild nonobs LAD dzs; c. 12/2021 Cor CTA: Ca2+ = 216 (87th%'ile). LM nl, LAD 25-49p, LCX nondom, nl, RCA <25ost.   Palpitations    a. 12/2021 Zio: RSR @ 72 (52-182). 3 runs NSVT (longest 5 beats, fastest 152 x 4 beats). 10 SVT runs (longest 15.1 secs @ 103, fastest 4 beats @ 182). Rare PACs/PVCs.   Personal history of radiation therapy    Pulmonary nodule    a. 12/2021 CT chest: stable 4mm RLL pulm nodule, likely benign, No f/u imaging req.   Systolic murmur    Past Surgical History:  Procedure Laterality Date   BREAST BIOPSY Left 10/27/2019   Affirm bx-distortion-     clip-path ADH   BREAST BIOPSY Left 01/07/2020   Affim bx 1 area/ x clip and ribbon clip/ ADH   BREAST LUMPECTOMY     BREAST LUMPECTOMY WITH NEEDLE LOCALIZATION Left 02/14/2020   Procedure: BREAST LUMPECTOMY WITH NEEDLE LOCALIZATION;  Surgeon: Earline Mayotte, MD;  Location: [facility] ORS;  Service: General;  Laterality: Left;   CHOLECYSTECTOMY  10/2020   COLONOSCOPY WITH PROPOFOL N/A 10/18/2022   Procedure: COLONOSCOPY WITH PROPOFOL;  Surgeon: Regis Bill, MD;  Location: [facility] ENDOSCOPY;  Service: Endoscopy;  Laterality: N/A;   FACIAL FRACTURE SURGERY     X 2   Family History  Problem Relation Age of Onset   Diabetes Mother  Heart disease Mother    Hypertension Mother    Cancer Father        prostate    Diverticulitis Father    CVA Father    Heart disease Father    Stroke Father    Hypertension Father    Heart murmur Father    Breast cancer Sister    Heart attack Brother    CVA Paternal Grandmother    Breast cancer Cousin    Social History   Socioeconomic History   Marital status: Married    Spouse name: Not on file   Number of children: 2   Years of education: Not on file   Highest education level: Not on file  Occupational History   Not on file  Tobacco Use   Smoking status: Never   Smokeless tobacco: Never  Vaping Use   Vaping  status: Never Used  Substance and Sexual Activity   Alcohol use: No    Alcohol/week: 0.0 standard drinks of alcohol   Drug use: No   Sexual activity: Not Currently  Other Topics Concern   Not on file  Social History Narrative   Married   Works Part time   Social Drivers of Corporate investment banker Strain: Low Risk  (02/07/2023)   Overall Financial Resource Strain (CARDIA)    Difficulty of Paying Living Expenses: Not hard at all  Food Insecurity: No Food Insecurity (02/07/2023)   Hunger Vital Sign    Worried About Running Out of Food in the Last Year: Never true    Ran Out of Food in the Last Year: Never true  Transportation Needs: No Transportation Needs (02/07/2023)   PRAPARE - Administrator, Civil Service (Medical): No    Lack of Transportation (Non-Medical): No  Physical Activity: Inactive (02/07/2023)   Exercise Vital Sign    Days of Exercise per Week: 0 days    Minutes of Exercise per Session: 0 min  Stress: No Stress Concern Present (02/07/2023)   [employer] of Occupational Health - Occupational Stress Questionnaire    Feeling of Stress : Only a little  Social Connections: Socially Integrated (02/07/2023)   Social Connection and Isolation Panel [NHANES]    Frequency of Communication with Friends and Family: More than three times a week    Frequency of Social Gatherings with Friends and Family: More than three times a week    Attends Religious Services: More than 4 times per year    Active Member of [employer] or Organizations: Yes    Attends Banker Meetings: Never    Marital Status: Married     Review of Systems  Constitutional:  Negative for appetite change and unexpected weight change.  HENT:  Negative for congestion and sinus pressure.   Respiratory:  Negative for cough, chest tightness and shortness of breath.   Cardiovascular:  Negative for chest pain and palpitations.  Gastrointestinal:  Negative for abdominal pain, diarrhea, nausea  and vomiting.  Genitourinary:  Negative for difficulty urinating and dysuria.  Musculoskeletal:  Negative for joint swelling and myalgias.  Skin:  Positive for rash.       Facial rash as outlined.  Also circular rash - left axilla.   Neurological:  Negative for dizziness and headaches.  Psychiatric/Behavioral:  Negative for agitation and dysphoric mood.        Objective:     BP 118/84   Pulse 72   Temp 97.8 F (36.6 C)   Ht 5\' 5"  (1.651 m)  Wt 183 lb 12.8 oz (83.4 kg)   LMP 06/24/2010   SpO2 97%   BMI 30.59 kg/m  Wt Readings from Last 3 Encounters:  06/05/23 183 lb 12.8 oz (83.4 kg)  03/14/23 183 lb 6.4 oz (83.2 kg)  02/07/23 180 lb (81.6 kg)    Physical Exam Vitals reviewed.  Constitutional:      General: She is not in acute distress.    Appearance: Normal appearance.  HENT:     Head: Normocephalic and atraumatic.     Right Ear: External ear normal.     Left Ear: External ear normal.  Eyes:     General: No scleral icterus.       Right eye: No discharge.        Left eye: No discharge.     Conjunctiva/sclera: Conjunctivae normal.  Neck:     Thyroid: No thyromegaly.  Cardiovascular:     Rate and Rhythm: Normal rate and regular rhythm.  Pulmonary:     Effort: No respiratory distress.     Breath sounds: Normal breath sounds. No wheezing.  Abdominal:     General: Bowel sounds are normal.     Palpations: Abdomen is soft.     Tenderness: There is no abdominal tenderness.  Musculoskeletal:        General: No swelling or tenderness.     Cervical back: Neck supple. No tenderness.  Lymphadenopathy:     Cervical: No cervical adenopathy.  Skin:    Comments: Increased erythematous rash - forehead, periorbital region and perioral region. Dry, scaling. Circular rash - left axilla.   Neurological:     Mental Status: She is alert.  Psychiatric:        Mood and Affect: Mood normal.        Behavior: Behavior normal.      Outpatient Encounter Medications as of  06/05/2023  Medication Sig   aspirin EC 81 MG EC tablet Take 1 tablet (81 mg total) by mouth daily.   clotrimazole-betamethasone (LOTRISONE) cream Apply 1 Application topically 2 (two) times daily. Apply to left axilla   furosemide (LASIX) 20 MG tablet Take 1 tablet (20 mg total) by mouth daily as needed (leg swelling).   lactobacillus acidophilus (BACID) TABS tablet Take 1 tablet by mouth daily.   magnesium gluconate (MAGONATE) 500 MG tablet Take 500 mg by mouth every other day.   mavacamten (CAMZYOS) 5 MG CAPS capsule Take 1 capsule (5 mg total) by mouth daily.   metoprolol succinate (TOPROL-XL) 100 MG 24 hr tablet Take 1 tablet (100 mg total) by mouth daily. Take with or immediately following a meal.   Multiple Vitamin (MULTIVITAMIN) tablet Take 1 tablet by mouth daily.   mupirocin ointment (BACTROBAN) 2 % Apply 1 Application topically 2 (two) times daily. Around nose.   tamoxifen (NOLVADEX) 20 MG tablet Take 20 mg by mouth daily.   tobramycin-dexamethasone (TOBRADEX) ophthalmic ointment Place 1 Application into the left eye 2 (two) times daily. (Patient taking differently: Place 1 Application into the left eye as needed.)   Vitamin D, Cholecalciferol, 10 MCG (400 UNIT) CAPS Take by mouth.   zinc gluconate 50 MG tablet Take 50 mg by mouth every other day.   atorvastatin (LIPITOR) 40 MG tablet Take 1 tablet (40 mg total) by mouth daily.   sertraline (ZOLOFT) 50 MG tablet Take 1 tablet (50 mg total) by mouth daily.   [DISCONTINUED] atorvastatin (LIPITOR) 40 MG tablet Take 1 tablet by mouth once daily   [DISCONTINUED] CALCIUM-MAGNESIUM-ZINC PO  Take by mouth daily at 6 (six) AM. (Patient not taking: Reported on 02/07/2023)   [DISCONTINUED] sertraline (ZOLOFT) 50 MG tablet Take 1 tablet by mouth once daily   No facility-administered encounter medications on file as of 06/05/2023.     Lab Results  Component Value Date   WBC 7.4 09/26/2022   HGB 14.7 09/26/2022   HCT 43.6 09/26/2022   PLT  152.0 09/26/2022   GLUCOSE 97 06/03/2023   CHOL 101 06/03/2023   TRIG 165.0 (H) 06/03/2023   HDL 33.30 (L) 06/03/2023   LDLDIRECT 57 01/19/2020   LDLCALC 35 06/03/2023   ALT 37 (H) 06/03/2023   AST 28 06/03/2023   NA 142 06/03/2023   K 3.9 06/03/2023   CL 109 06/03/2023   CREATININE 0.87 06/03/2023   BUN 14 06/03/2023   CO2 26 06/03/2023   TSH 1.75 09/26/2022   INR 1.0 11/19/2019   HGBA1C 5.2 03/30/2013       Assessment & Plan:  Mixed hyperlipidemia Assessment & Plan: Low cholesterol diet and exercise.  Continue lipitor.  Follow lipid panel and liver function tests.   Lab Results  Component Value Date   CHOL 101 06/03/2023   HDL 33.30 (L) 06/03/2023   LDLCALC 35 06/03/2023   LDLDIRECT 57 01/19/2020   TRIG 165.0 (H) 06/03/2023   CHOLHDL 3 06/03/2023    Orders: -     CBC with Differential/Platelet; Future -     Basic metabolic panel; Future -     Hepatic function panel; Future -     TSH; Future  Visit for screening mammogram -     3D Screening Mammogram, Left and Right; Future  Rash Assessment & Plan: Persistent facial rash as outlined.  Stop the cortisone, neosporin and noxzema.  Question if continuing to apply these topically is aggravating.  Bactroban - nares and just below nose. Treat the axillary lesion with lotrisone cream. Refer to dermatology. Call with update.   Orders: -     Ambulatory referral to Dermatology  Coronary artery disease of native artery of native heart with stable angina pectoris [facility]) Assessment & Plan: Previously evaluated by cardiology for chest pain with minimal exertion.  To review, recommended f/u CTA and cardiac monitor.  Monitor - Paroxysmal SVTs, 3 short runs of VT lasting 4 beats noted. Overall no significant/persistent arrhythmias. CTA - Stable 4 mm pulmonary nodule in the right lower lobe. This is stable since June 2021 and is likely benign. No follow-up imaging recommended. Coronary calcium score of 216.  Calcified plaque -  proximal LAD mild stenosis - 35-49%.  Minimal stenosis - ostial RCA - mild non obstructive CAD.  Recommended continuing aspirin, lipitor and toprol.  MRCM - findings c/w hypertrophic obstructive cardiomyopathy.  Felt better with increase in toprol.  Saw Dr Izora Ribas for evaluation of hypertrophic cardiomyopathy - to assess - started mavacamten.  Feels better.  SOB has improved. Had f/u echo and stress echo 10/24/22.  Currently stable.    Abnormal liver function test Assessment & Plan: Recent lab revealed a slightly elevated ALT.  Discussed.  Recheck liver panel in several weeks.  Diet and exercise.    Ductal carcinoma in situ (DCIS) of left breast Assessment & Plan: S/p excision and XRT.  Now followed by Dr Tonna Boehringer. Saw Dr Lemar Livings - 08/28/21 - tamoxifen for 5 years total.(dx 01/2020)  F/u one year diagnostic mammogram.    Mammogram 07/2022 - Birads II.    Endometrial thickening on ultrasound Assessment & Plan: Saw gyn 3/1/124 -  endometrial thickening. Attempted endometrial biopsy - unsuccessful. Elected to f/u pelvic ultrasound in 3 months. Had f/u 01/16/23 - endometrial stripe is 6.18mm compared to 7.1mm on last measurement. Recommended f/u ultrasound in 6 months.   Essential hypertension Assessment & Plan: Continue metoprolol.  Blood pressure as outlined.  No changes in medication.  Follow pressures.  Follow metabolic panel.    Hematuria, unspecified type Assessment & Plan: Evaluated by urology - 02/28/21 - cystoscopy - normal.  Follow urine. CT urogram if hematuria worsens or if develops gross hematuria.  F/u two years.    HOCM (hypertrophic obstructive cardiomyopathy) (HCC) Assessment & Plan: Had f/u with cardiology 03/14/23 - f/u hypertrophic cardiomyopathy. Continues on mavacamten.  Stable. ECHO 03/2023 - stable.  Recommended f/u echo in 06/2023.    Thrombocytopenia (HCC) Assessment & Plan: Follow cbc.    Stress Assessment & Plan: Continues on zoloft.  Overall appears to be  handling things well.  Does not feel needs any further intervention.  Follow.    Other orders -     Atorvastatin Calcium; Take 1 tablet (40 mg total) by mouth daily.  Dispense: 90 tablet; Refill: 3 -     Sertraline HCl; Take 1 tablet (50 mg total) by mouth daily.  Dispense: 90 tablet; Refill: 1 -     Mupirocin; Apply 1 Application topically 2 (two) times daily. Around nose.  Dispense: 22 g; Refill: 0 -     Clotrimazole-Betamethasone; Apply 1 Application topically 2 (two) times daily. Apply to left axilla  Dispense: 60 g; Refill: 0     Dale Tama, MD

## 2023-06-08 ENCOUNTER — Encounter: Payer: Self-pay | Admitting: Internal Medicine

## 2023-06-08 NOTE — Assessment & Plan Note (Signed)
.   Saw gyn 3/1/124 - endometrial thickening. Attempted endometrial biopsy - unsuccessful. Elected to f/u pelvic ultrasound in 3 months. Had f/u 01/16/23 - endometrial stripe is 6.26mm compared to 7.74mm on last measurement. Recommended f/u ultrasound in 6 months.

## 2023-06-08 NOTE — Assessment & Plan Note (Signed)
Persistent facial rash as outlined.  Stop the cortisone, neosporin and noxzema.  Question if continuing to apply these topically is aggravating.  Bactroban - nares and just below nose. Treat the axillary lesion with lotrisone cream. Refer to dermatology. Call with update.

## 2023-06-08 NOTE — Assessment & Plan Note (Signed)
Recent lab revealed a slightly elevated ALT.  Discussed.  Recheck liver panel in several weeks.  Diet and exercise.

## 2023-06-08 NOTE — Assessment & Plan Note (Signed)
Low cholesterol diet and exercise.  Continue lipitor.  Follow lipid panel and liver function tests.   Lab Results  Component Value Date   CHOL 101 06/03/2023   HDL 33.30 (L) 06/03/2023   LDLCALC 35 06/03/2023   LDLDIRECT 57 01/19/2020   TRIG 165.0 (H) 06/03/2023   CHOLHDL 3 06/03/2023

## 2023-06-08 NOTE — Assessment & Plan Note (Signed)
S/p excision and XRT.  Now followed by Dr Tonna Boehringer. Saw Dr Lemar Livings - 08/28/21 - tamoxifen for 5 years total.(dx 01/2020)  F/u one year diagnostic mammogram.    Mammogram 07/2022 - Birads II>

## 2023-06-08 NOTE — Assessment & Plan Note (Signed)
Follow cbc.  

## 2023-06-08 NOTE — Assessment & Plan Note (Signed)
Previously evaluated by cardiology for chest pain with minimal exertion.  To review, recommended f/u CTA and cardiac monitor.  Monitor - Paroxysmal SVTs, 3 short runs of VT lasting 4 beats noted. Overall no significant/persistent arrhythmias. CTA - Stable 4 mm pulmonary nodule in the right lower lobe. This is stable since June 2021 and is likely benign. No follow-up imaging recommended. Coronary calcium score of 216.  Calcified plaque - proximal LAD mild stenosis - 35-49%.  Minimal stenosis - ostial RCA - mild non obstructive CAD.  Recommended continuing aspirin, lipitor and toprol.  MRCM - findings c/w hypertrophic obstructive cardiomyopathy.  Felt better with increase in toprol.  Saw Dr Izora Ribas for evaluation of hypertrophic cardiomyopathy - to assess - started mavacamten.  Feels better.  SOB has improved. Had f/u echo and stress echo 10/24/22.  Currently stable.

## 2023-06-08 NOTE — Assessment & Plan Note (Signed)
Continue metoprolol.  Blood pressure as outlined.  No changes in medication.  Follow pressures.  Follow metabolic panel.  

## 2023-06-08 NOTE — Assessment & Plan Note (Signed)
Continues on zoloft.  Overall appears to be handling things well.  Does not feel needs any further intervention.  Follow.  

## 2023-06-08 NOTE — Assessment & Plan Note (Signed)
Evaluated by urology - 02/28/21 - cystoscopy - normal.  Follow urine. CT urogram if hematuria worsens or if develops gross hematuria.  F/u two years.

## 2023-06-08 NOTE — Assessment & Plan Note (Signed)
Had f/u with cardiology 03/14/23 - f/u hypertrophic cardiomyopathy. Continues on mavacamten.  Stable. ECHO 03/2023 - stable.  Recommended f/u echo in 06/2023.

## 2023-06-23 ENCOUNTER — Encounter: Payer: Self-pay | Admitting: Radiation Oncology

## 2023-06-23 ENCOUNTER — Ambulatory Visit
Admission: RE | Admit: 2023-06-23 | Discharge: 2023-06-23 | Disposition: A | Discharge status: Home / Self Care | Payer: Medicare HMO | Source: Ambulatory Visit | Attending: Radiation Oncology | Admitting: Radiation Oncology

## 2023-06-23 VITALS — BP 134/76 | HR 69 | Temp 98.8°F | Resp 16 | Wt 184.0 lb

## 2023-06-23 DIAGNOSIS — Z170 Estrogen receptor positive status [ER+]: Secondary | ICD-10-CM | POA: Diagnosis not present

## 2023-06-23 DIAGNOSIS — Z79810 Long term (current) use of selective estrogen receptor modulators (SERMs): Secondary | ICD-10-CM | POA: Insufficient documentation

## 2023-06-23 DIAGNOSIS — Z923 Personal history of irradiation: Secondary | ICD-10-CM | POA: Diagnosis not present

## 2023-06-23 DIAGNOSIS — D0511 Intraductal carcinoma in situ of right breast: Secondary | ICD-10-CM | POA: Diagnosis not present

## 2023-06-23 DIAGNOSIS — D0512 Intraductal carcinoma in situ of left breast: Secondary | ICD-10-CM | POA: Diagnosis not present

## 2023-06-23 NOTE — Progress Notes (Signed)
Radiation Oncology Follow up Note  Name: Mary Walter   Date:   06/23/2023 MRN:  [id number] DOB: 19-Mar-1956    This 67 y.o. female presents to the clinic today for 2-year follow-up status post whole breast radiation to her right breast for ER positive ductal carcinoma in situ.  REFERRING PROVIDER: Dale , MD  HPI: Patient is a 67 year old female now out over 3 years having completed whole breast radiation to her right breast for ER positive ductal carcinoma in situ.  Seen today in routine follow-up she is doing well specifically denies breast tenderness cough or bone pain..  Mammograms back in February which I have reviewed were BI-RADS 2 benign.  She has further mammograms this February.  She is currently on tamoxifen tolerating that well without side effect.  COMPLICATIONS OF TREATMENT: none  FOLLOW UP COMPLIANCE: keeps appointments   PHYSICAL EXAM:  BP 134/76   Pulse 69   Temp 98.8 F (37.1 C) (Tympanic)   Resp 16   Wt 184 lb (83.5 kg)   LMP 06/24/2010   BMI 30.62 kg/m  Lungs are clear to A&P cardiac examination essentially unremarkable with regular rate and rhythm. No dominant mass or nodularity is noted in either breast in 2 positions examined. Incision is well-healed. No axillary or supraclavicular adenopathy is appreciated. Cosmetic result is excellent.  Well-developed well-nourished patient in NAD. HEENT reveals PERLA, EOMI, discs not visualized.  Oral cavity is clear. No oral mucosal lesions are identified. Neck is clear without evidence of cervical or supraclavicular adenopathy. Lungs are clear to A&P. Cardiac examination is essentially unremarkable with regular rate and rhythm without murmur rub or thrill. Abdomen is benign with no organomegaly or masses noted. Motor sensory and DTR levels are equal and symmetric in the upper and lower extremities. Cranial nerves II through XII are grossly intact. Proprioception is intact. No peripheral adenopathy or edema is  identified. No motor or sensory levels are noted. Crude visual fields are within normal range.  RADIOLOGY RESULTS: Mammograms reviewed  PLAN: Present time patient is doing well now at over 3 years with no evidence of disease.  She continues seeing her surgeon as well as her family doctor.  I am going to turn follow-up care over to them.  She is having mammograms ordered through her surgeons office.  Patient knows to call with any concerns at any time.  I would like to take this opportunity to thank you for allowing me to participate in the care of your patient.Carmina Miller, MD

## 2023-07-02 ENCOUNTER — Other Ambulatory Visit (INDEPENDENT_AMBULATORY_CARE_PROVIDER_SITE_OTHER): Payer: Medicare HMO

## 2023-07-02 DIAGNOSIS — E782 Mixed hyperlipidemia: Secondary | ICD-10-CM | POA: Diagnosis not present

## 2023-07-02 LAB — BASIC METABOLIC PANEL
BUN: 15 mg/dL (ref 6–23)
CO2: 28 meq/L (ref 19–32)
Calcium: 11.1 mg/dL — ABNORMAL HIGH (ref 8.4–10.5)
Chloride: 108 meq/L (ref 96–112)
Creatinine, Ser: 0.88 mg/dL (ref 0.40–1.20)
GFR: 67.44 mL/min (ref >60.00)
Glucose, Bld: 86 mg/dL (ref 70–99)
Potassium: 3.9 meq/L (ref 3.5–5.1)
Sodium: 143 meq/L (ref 135–145)

## 2023-07-02 LAB — CBC WITH DIFFERENTIAL/PLATELET
Basophils Absolute: 0.1 10*3/uL (ref 0.0–0.1)
Basophils Relative: 1.1 % (ref 0.0–3.0)
Eosinophils Absolute: 0.2 10*3/uL (ref 0.0–0.7)
Eosinophils Relative: 2.9 % (ref 0.0–5.0)
HCT: 44.2 % (ref 36.0–46.0)
Hemoglobin: 14.8 g/dL (ref 12.0–15.0)
Lymphocytes Relative: 41.7 % (ref 12.0–46.0)
Lymphs Abs: 3.1 10*3/uL (ref 0.7–4.0)
MCHC: 33.5 g/dL (ref 30.0–36.0)
MCV: 94.1 fL (ref 78.0–100.0)
Monocytes Absolute: 0.6 10*3/uL (ref 0.1–1.0)
Monocytes Relative: 8 % (ref 3.0–12.0)
Neutro Abs: 3.4 10*3/uL (ref 1.4–7.7)
Neutrophils Relative %: 46.3 % (ref 43.0–77.0)
Platelets: 165 10*3/uL (ref 150.0–400.0)
RBC: 4.69 Mil/uL (ref 3.87–5.11)
RDW: 13.7 % (ref 11.5–15.5)
WBC: 7.4 10*3/uL (ref 4.0–10.5)

## 2023-07-02 LAB — TSH: TSH: 2.08 u[IU]/mL (ref 0.35–5.50)

## 2023-07-02 LAB — HEPATIC FUNCTION PANEL
ALT: 38 U/L — ABNORMAL HIGH (ref 0–35)
AST: 30 U/L (ref 0–37)
Albumin: 4.1 g/dL (ref 3.5–5.2)
Alkaline Phosphatase: 85 U/L (ref 39–117)
Bilirubin, Direct: 0.1 mg/dL (ref 0.0–0.3)
Total Bilirubin: 0.7 mg/dL (ref 0.2–1.2)
Total Protein: 6.6 g/dL (ref 6.0–8.3)

## 2023-07-04 ENCOUNTER — Telehealth: Payer: Self-pay | Admitting: Internal Medicine

## 2023-07-04 ENCOUNTER — Other Ambulatory Visit: Payer: Self-pay

## 2023-07-04 ENCOUNTER — Ambulatory Visit (HOSPITAL_COMMUNITY): Payer: Medicare HMO | Attending: Cardiovascular Disease

## 2023-07-04 DIAGNOSIS — I421 Obstructive hypertrophic cardiomyopathy: Secondary | ICD-10-CM

## 2023-07-04 LAB — ECHOCARDIOGRAM LIMITED
MV M vel: 5.29 m/s
MV Peak grad: 111.7 mm[Hg]
Radius: 0.5 cm
S' Lateral: 2.55 cm

## 2023-07-04 NOTE — Progress Notes (Signed)
Pt aware. Orders placed. 

## 2023-07-04 NOTE — Telephone Encounter (Signed)
 Patient notes that she is doing .   Since last visit notes has no symptoms (NYHA I) Echo: LVEF 60%. Mild, eccentric, MR with splay. LVOT gradient 7 mm HG.  No chest pain or pressure .  No SOB/DOE and no PND/Orthopnea.  No weight gain or leg swelling.  No palpitations or syncope.  No new medications.  Mavacamten  dose continues Echo in 12 weeks.   Stanly Leavens, MD FASE [facility] Cardiologist [facility]  [address] [place], [address] [place], [place] [address] [phone]  5:57 PM

## 2023-07-07 MED ORDER — CAMZYOS 5 MG PO CAPS: 5.0000 mg | ORAL_CAPSULE | Freq: Every day | ORAL | 2 refills | Status: DC

## 2023-07-07 NOTE — Telephone Encounter (Signed)
 [facility] portal updated, refill of mavacamten 5 mg sent to specialty pharmacy on file.

## 2023-07-07 NOTE — Addendum Note (Signed)
 Addended by: Macie Burows on: 07/07/2023 12:38 PM   Modules accepted: Orders

## 2023-07-10 ENCOUNTER — Other Ambulatory Visit (INDEPENDENT_AMBULATORY_CARE_PROVIDER_SITE_OTHER): Payer: Medicare HMO

## 2023-07-10 LAB — VITAMIN D 25 HYDROXY (VIT D DEFICIENCY, FRACTURES): VITD: 48.12 ng/mL (ref 30.00–100.00)

## 2023-07-10 LAB — CALCIUM: Calcium: 11.2 mg/dL — ABNORMAL HIGH (ref 8.4–10.5)

## 2023-07-11 LAB — PARATHYROID HORMONE, INTACT (NO CA): PTH: 43 pg/mL (ref 16–77)

## 2023-07-11 NOTE — Addendum Note (Signed)
Addended by: Macie Burows on: 07/11/2023 12:35 PM   Modules accepted: Orders

## 2023-07-16 NOTE — Addendum Note (Signed)
Addended by: Charm Barges on: 07/16/2023 07:43 PM   Modules accepted: Orders

## 2023-07-18 ENCOUNTER — Other Ambulatory Visit (HOSPITAL_COMMUNITY): Payer: Self-pay

## 2023-07-18 ENCOUNTER — Telehealth: Payer: Self-pay | Admitting: Internal Medicine

## 2023-07-18 ENCOUNTER — Telehealth: Payer: Self-pay | Admitting: Pharmacy Technician

## 2023-07-18 NOTE — Telephone Encounter (Signed)
Pt c/o medication issue:  1. Name of Medication: mavacamten (CAMZYOS) 5 MG CAPS capsule   2. How are you currently taking this medication (dosage and times per day)? a  3. Are you having a reaction (difficulty breathing--STAT)? no  4. What is your medication issue? Calling for prior auth for medication. Please advise

## 2023-07-18 NOTE — Telephone Encounter (Signed)
Pharmacy Patient Advocate Encounter   Received notification from Pt Calls Messages that prior authorization for Camzyos 5MG  capsules is required/requested.   Insurance verification completed.   The patient is insured through [employer] [facility] .   Per test claim: PA required; PA submitted to above mentioned insurance via CoverMyMeds Key/confirmation #/EOC [id number] Status is pending

## 2023-07-18 NOTE — Telephone Encounter (Signed)
Pharmacy Patient Advocate Encounter  Received notification from [employer] that Prior Authorization for Camzyos 5MG  capsules  has been APPROVED from 06/25/23 to 06/23/24. Ran test claim, Copay is $1,998.60. This test claim was processed through [facility]- copay amounts may vary at other pharmacies due to pharmacy/plan contracts, or as the patient moves through the different stages of their insurance plan.

## 2023-07-25 DIAGNOSIS — R9389 Abnormal findings on diagnostic imaging of other specified body structures: Secondary | ICD-10-CM | POA: Diagnosis not present

## 2023-08-19 ENCOUNTER — Ambulatory Visit
Admission: RE | Admit: 2023-08-19 | Discharge: 2023-08-19 | Disposition: A | Discharge status: Home / Self Care | Payer: Medicare HMO | Source: Ambulatory Visit | Attending: Internal Medicine | Admitting: Internal Medicine

## 2023-08-19 DIAGNOSIS — Z1231 Encounter for screening mammogram for malignant neoplasm of breast: Secondary | ICD-10-CM | POA: Insufficient documentation

## 2023-08-21 ENCOUNTER — Other Ambulatory Visit: Payer: Self-pay | Admitting: Internal Medicine

## 2023-08-21 DIAGNOSIS — R921 Mammographic calcification found on diagnostic imaging of breast: Secondary | ICD-10-CM

## 2023-08-21 DIAGNOSIS — R928 Other abnormal and inconclusive findings on diagnostic imaging of breast: Secondary | ICD-10-CM

## 2023-08-21 DIAGNOSIS — N6489 Other specified disorders of breast: Secondary | ICD-10-CM

## 2023-08-25 ENCOUNTER — Ambulatory Visit
Admission: RE | Admit: 2023-08-25 | Discharge: 2023-08-25 | Disposition: A | Discharge status: Home / Self Care | Payer: Medicare HMO | Source: Ambulatory Visit | Attending: Internal Medicine | Admitting: Internal Medicine

## 2023-08-25 ENCOUNTER — Other Ambulatory Visit: Payer: Self-pay | Admitting: Internal Medicine

## 2023-08-25 DIAGNOSIS — R921 Mammographic calcification found on diagnostic imaging of breast: Secondary | ICD-10-CM

## 2023-08-25 DIAGNOSIS — R928 Other abnormal and inconclusive findings on diagnostic imaging of breast: Secondary | ICD-10-CM

## 2023-08-25 DIAGNOSIS — N6489 Other specified disorders of breast: Secondary | ICD-10-CM

## 2023-08-25 DIAGNOSIS — R92333 Mammographic heterogeneous density, bilateral breasts: Secondary | ICD-10-CM | POA: Diagnosis not present

## 2023-08-26 DIAGNOSIS — Z853 Personal history of malignant neoplasm of breast: Secondary | ICD-10-CM | POA: Diagnosis not present

## 2023-08-28 DIAGNOSIS — L821 Other seborrheic keratosis: Secondary | ICD-10-CM | POA: Diagnosis not present

## 2023-08-28 DIAGNOSIS — D2271 Melanocytic nevi of right lower limb, including hip: Secondary | ICD-10-CM | POA: Diagnosis not present

## 2023-08-28 DIAGNOSIS — D2272 Melanocytic nevi of left lower limb, including hip: Secondary | ICD-10-CM | POA: Diagnosis not present

## 2023-08-28 DIAGNOSIS — C44612 Basal cell carcinoma of skin of right upper limb, including shoulder: Secondary | ICD-10-CM | POA: Diagnosis not present

## 2023-08-28 DIAGNOSIS — D485 Neoplasm of uncertain behavior of skin: Secondary | ICD-10-CM | POA: Diagnosis not present

## 2023-08-28 DIAGNOSIS — L820 Inflamed seborrheic keratosis: Secondary | ICD-10-CM | POA: Diagnosis not present

## 2023-08-28 DIAGNOSIS — D225 Melanocytic nevi of trunk: Secondary | ICD-10-CM | POA: Diagnosis not present

## 2023-08-28 DIAGNOSIS — L439 Lichen planus, unspecified: Secondary | ICD-10-CM | POA: Diagnosis not present

## 2023-08-28 DIAGNOSIS — L309 Dermatitis, unspecified: Secondary | ICD-10-CM | POA: Diagnosis not present

## 2023-08-28 DIAGNOSIS — D2262 Melanocytic nevi of left upper limb, including shoulder: Secondary | ICD-10-CM | POA: Diagnosis not present

## 2023-08-28 DIAGNOSIS — D2261 Melanocytic nevi of right upper limb, including shoulder: Secondary | ICD-10-CM | POA: Diagnosis not present

## 2023-08-29 ENCOUNTER — Inpatient Hospital Stay: Admission: RE | Admit: 2023-08-29 | Source: Ambulatory Visit

## 2023-09-01 ENCOUNTER — Ambulatory Visit
Admission: RE | Admit: 2023-09-01 | Discharge: 2023-09-01 | Disposition: A | Discharge status: Home / Self Care | Source: Ambulatory Visit | Attending: Internal Medicine | Admitting: Internal Medicine

## 2023-09-01 DIAGNOSIS — R928 Other abnormal and inconclusive findings on diagnostic imaging of breast: Secondary | ICD-10-CM | POA: Insufficient documentation

## 2023-09-01 DIAGNOSIS — N6022 Fibroadenosis of left breast: Secondary | ICD-10-CM | POA: Insufficient documentation

## 2023-09-01 DIAGNOSIS — N641 Fat necrosis of breast: Secondary | ICD-10-CM | POA: Diagnosis not present

## 2023-09-01 DIAGNOSIS — R921 Mammographic calcification found on diagnostic imaging of breast: Secondary | ICD-10-CM | POA: Insufficient documentation

## 2023-09-01 HISTORY — PX: BREAST BIOPSY: SHX20

## 2023-09-01 MED ORDER — LIDOCAINE 1 % OPTIME INJ - NO CHARGE
5.0000 mL | Freq: Once | INTRAMUSCULAR | Status: AC
  Administered 2023-09-01: 5 mL
  Filled 2023-09-01: qty 6

## 2023-09-01 MED ORDER — LIDOCAINE-EPINEPHRINE 1 %-1:100000 IJ SOLN
20.0000 mL | Freq: Once | INTRAMUSCULAR | Status: AC
Start: 2023-09-01 — End: 2023-09-01
  Administered 2023-09-01: 20 mL
  Filled 2023-09-01: qty 20

## 2023-09-02 LAB — SURGICAL PATHOLOGY

## 2023-09-05 ENCOUNTER — Other Ambulatory Visit: Payer: Self-pay | Admitting: Internal Medicine

## 2023-09-10 DIAGNOSIS — H0014 Chalazion left upper eyelid: Secondary | ICD-10-CM | POA: Diagnosis not present

## 2023-09-15 DIAGNOSIS — E210 Primary hyperparathyroidism: Secondary | ICD-10-CM | POA: Diagnosis not present

## 2023-09-25 ENCOUNTER — Other Ambulatory Visit: Payer: Self-pay

## 2023-09-25 DIAGNOSIS — E782 Mixed hyperlipidemia: Secondary | ICD-10-CM

## 2023-09-26 ENCOUNTER — Telehealth: Payer: Self-pay | Admitting: Internal Medicine

## 2023-09-26 ENCOUNTER — Ambulatory Visit (HOSPITAL_COMMUNITY): Payer: Medicare HMO | Attending: Internal Medicine

## 2023-09-26 DIAGNOSIS — I421 Obstructive hypertrophic cardiomyopathy: Secondary | ICD-10-CM | POA: Diagnosis not present

## 2023-09-26 LAB — ECHOCARDIOGRAM LIMITED
Area-P 1/2: 3.31 cm2
MV M vel: 4.78 m/s
MV Peak grad: 91.2 mmHg
S' Lateral: 2 cm

## 2023-09-26 MED ORDER — CAMZYOS 5 MG PO CAPS: 5.0000 mg | ORAL_CAPSULE | Freq: Every day | ORAL | 2 refills | Status: DC

## 2023-09-26 NOTE — Addendum Note (Signed)
 Addended by: Macie Burows on: 09/26/2023 04:52 PM   Modules accepted: Orders

## 2023-09-26 NOTE — Telephone Encounter (Signed)
PSF updated on Camzyos [facility] portal. Refill of Camzyos 5 mg sent to specialty pharmacy on file.

## 2023-09-26 NOTE — Telephone Encounter (Signed)
 Called [facility] pharmacy gave verbal order for mavacamten 5 mg PO every day #35 2 refills.

## 2023-09-26 NOTE — Telephone Encounter (Signed)
 Called patient  Patient notes that she is doing well   Since last visit notes no symptoms.  Working to getting back to walking. Was briefly on a facial skin cream.  No new medications presently. Echo: LVEF 63%.  LVOT gradient 3 mm Hg  No chest pain or pressure .  No SOB/DOE and no PND/Orthopnea.  No weight gain or leg swelling.  No palpitations or syncope .  Continue current mavacamten dose Echo in 12 weeks.  Patient had no further questions.  Riley Lam, MD Cardiologist [facility]  [address] [place], [address] [place], [place] [address] [phone]  4:18 PM

## 2023-09-29 NOTE — Addendum Note (Signed)
 Addended by: Macie Burows on: 09/29/2023 10:28 AM   Modules accepted: Orders

## 2023-10-02 DIAGNOSIS — L820 Inflamed seborrheic keratosis: Secondary | ICD-10-CM | POA: Diagnosis not present

## 2023-10-02 DIAGNOSIS — L2989 Other pruritus: Secondary | ICD-10-CM | POA: Diagnosis not present

## 2023-10-02 DIAGNOSIS — L309 Dermatitis, unspecified: Secondary | ICD-10-CM | POA: Diagnosis not present

## 2023-10-02 DIAGNOSIS — C44612 Basal cell carcinoma of skin of right upper limb, including shoulder: Secondary | ICD-10-CM | POA: Diagnosis not present

## 2023-10-02 DIAGNOSIS — L538 Other specified erythematous conditions: Secondary | ICD-10-CM | POA: Diagnosis not present

## 2023-10-02 DIAGNOSIS — R208 Other disturbances of skin sensation: Secondary | ICD-10-CM | POA: Diagnosis not present

## 2023-10-03 ENCOUNTER — Other Ambulatory Visit (INDEPENDENT_AMBULATORY_CARE_PROVIDER_SITE_OTHER): Payer: Medicare HMO

## 2023-10-03 DIAGNOSIS — E782 Mixed hyperlipidemia: Secondary | ICD-10-CM

## 2023-10-03 LAB — HEPATIC FUNCTION PANEL
ALT: 34 U/L (ref 0–35)
AST: 27 U/L (ref 0–37)
Albumin: 4.1 g/dL (ref 3.5–5.2)
Alkaline Phosphatase: 77 U/L (ref 39–117)
Bilirubin, Direct: 0.2 mg/dL (ref 0.0–0.3)
Total Bilirubin: 0.8 mg/dL (ref 0.2–1.2)
Total Protein: 6.2 g/dL (ref 6.0–8.3)

## 2023-10-03 LAB — BASIC METABOLIC PANEL WITH GFR
BUN: 17 mg/dL (ref 6–23)
CO2: 27 meq/L (ref 19–32)
Calcium: 10.5 mg/dL (ref 8.4–10.5)
Chloride: 108 meq/L (ref 96–112)
Creatinine, Ser: 0.8 mg/dL (ref 0.40–1.20)
GFR: 75.47 mL/min (ref >60.00)
Glucose, Bld: 100 mg/dL — ABNORMAL HIGH (ref 70–99)
Potassium: 4.1 meq/L (ref 3.5–5.1)
Sodium: 141 meq/L (ref 135–145)

## 2023-10-03 LAB — LIPID PANEL
Cholesterol: 112 mg/dL (ref 0–200)
HDL: 36 mg/dL — ABNORMAL LOW (ref >39.00)
LDL Cholesterol: 46 mg/dL (ref 0–99)
NonHDL: 76.29
Total CHOL/HDL Ratio: 3
Triglycerides: 152 mg/dL — ABNORMAL HIGH (ref 0.0–149.0)
VLDL: 30.4 mg/dL (ref 0.0–40.0)

## 2023-10-09 ENCOUNTER — Ambulatory Visit (INDEPENDENT_AMBULATORY_CARE_PROVIDER_SITE_OTHER): Payer: Medicare HMO | Admitting: Internal Medicine

## 2023-10-09 ENCOUNTER — Encounter: Payer: Self-pay | Admitting: Internal Medicine

## 2023-10-09 VITALS — BP 120/68 | HR 76 | Temp 98.0°F | Resp 16 | Ht 65.0 in | Wt 187.0 lb

## 2023-10-09 DIAGNOSIS — F439 Reaction to severe stress, unspecified: Secondary | ICD-10-CM

## 2023-10-09 DIAGNOSIS — R9389 Abnormal findings on diagnostic imaging of other specified body structures: Secondary | ICD-10-CM

## 2023-10-09 DIAGNOSIS — I25118 Atherosclerotic heart disease of native coronary artery with other forms of angina pectoris: Secondary | ICD-10-CM

## 2023-10-09 DIAGNOSIS — R7989 Other specified abnormal findings of blood chemistry: Secondary | ICD-10-CM

## 2023-10-09 DIAGNOSIS — E782 Mixed hyperlipidemia: Secondary | ICD-10-CM | POA: Diagnosis not present

## 2023-10-09 DIAGNOSIS — D696 Thrombocytopenia, unspecified: Secondary | ICD-10-CM

## 2023-10-09 DIAGNOSIS — R319 Hematuria, unspecified: Secondary | ICD-10-CM | POA: Diagnosis not present

## 2023-10-09 DIAGNOSIS — I10 Essential (primary) hypertension: Secondary | ICD-10-CM

## 2023-10-09 DIAGNOSIS — D0512 Intraductal carcinoma in situ of left breast: Secondary | ICD-10-CM | POA: Diagnosis not present

## 2023-10-09 DIAGNOSIS — I421 Obstructive hypertrophic cardiomyopathy: Secondary | ICD-10-CM | POA: Diagnosis not present

## 2023-10-09 DIAGNOSIS — E210 Primary hyperparathyroidism: Secondary | ICD-10-CM

## 2023-10-09 DIAGNOSIS — Z0000 Encounter for general adult medical examination without abnormal findings: Secondary | ICD-10-CM | POA: Diagnosis not present

## 2023-10-09 MED ORDER — SERTRALINE HCL 50 MG PO TABS: 50.0000 mg | ORAL_TABLET | Freq: Every day | ORAL | 1 refills | Status: DC

## 2023-10-09 MED ORDER — ATORVASTATIN CALCIUM 40 MG PO TABS: 40.0000 mg | ORAL_TABLET | Freq: Every day | ORAL | 3 refills | Status: DC

## 2023-10-09 NOTE — Assessment & Plan Note (Signed)
 Physical today 10/09/23.  Colonoscopy 11/2016 - few small polyps and internal hemorrhoids.  Recommended f/u colonoscopy in 10 years.  Mammogram 08/21/23 - recommended f/u views and subsequent biopsy.  S/p breast biopsy 09/01/23 - ok. Recommended annual screening mammogram - due 07/2024.

## 2023-10-09 NOTE — Patient Instructions (Signed)
 Dr Rosea Conch - Monday 10/13/23 - 11:00

## 2023-10-09 NOTE — Progress Notes (Signed)
 Subjective:    [name], female    DOB: 1956/01/02, 68 y.o.   MRN: [id number]  Patient here for  Chief Complaint  Patient presents with   Annual Exam    HPI Here for a physical exam. Evaluated by endocrinology for hypercalcemia. Labs are c/w primary hyperparathyroidism. Continuing to monitor. Discussed with her today. Recommended f/u bone density in 07/2024. Had f/u with Dr Rosea Conch 08/26/23 - f/u breast cancer s/p left lumpectomy 2021. Continues on tamoxifen. Recommended f/u colonoscopy 2028. S/p breast biopsy 09/01/23 - ok. Recommended annual screening mammogram - due 07/2024. She is noticing a persistent nodule left breast - around biopsy site. No pain. Saw Dr Cleora Daft 07/25/23 - endometrial thickening on ultrasound - stable. Continue to follow. Recommended f/u in 8 months. Had f/u with cardiology 03/14/23 - f/u hypertrophic cardiomyopathy. Continues on mavacamten . Stable. ECHO 03/2023 - stable. Had f/u ECHO 09/2023 - EF 63%, Grade I DD, no significant valve abnormality. No chest pain reported. Breathing stable. Discussed diet and exercise.    Past Medical History:  Diagnosis Date   Breast cancer (HCC)    Chest pain    Dyspnea    Family history of adverse reaction to anesthesia    SON-LUNGS FILLED UP WITH FLUID AFTER APPENDECTOMY   Hematuria    HOCM (hypertrophic obstructive cardiomyopathy) (HCC)    a. 10/2019 Echo: EF 70-75%, mod LVH. No rwma. Mild SAM, mild LVOT obs(peak grad 20 @ rest 26 w/ Valsalva); b. 01/2022 cMRI: EF 73%, sev asymm LV basal septal hypertrophy- basal segment 1.6cm. Transmural LGE/scar in LV basal segment (point of max thickness & RV insertion). SAM->mild MR & LVOT obs-->HOCM.   Hypercholesterolemia    Hypertension    Nonobstructive CAD (coronary artery disease)    a.10/2015 MV: No ischemia/infarct; b. 11/2019 Cor CTA: mild nonobs LAD dzs; c. 12/2021 Cor CTA: Ca2+ = 216 (87th%'ile). LM nl, LAD 25-49p, LCX nondom, nl, RCA <25ost.   Palpitations    a. 12/2021  Zio: RSR @ 72 (52-182). 3 runs NSVT (longest 5 beats, fastest 152 x 4 beats). 10 SVT runs (longest 15.1 secs @ 103, fastest 4 beats @ 182). Rare PACs/PVCs.   Personal history of radiation therapy    Pulmonary nodule    a. 12/2021 CT chest: stable 4mm RLL pulm nodule, likely benign, No f/u imaging req.   Systolic murmur    Past Surgical History:  Procedure Laterality Date   BREAST BIOPSY Left 10/27/2019   Affirm bx-distortion-     clip-path ADH   BREAST BIOPSY Left 01/07/2020   Affim bx 1 area/ x clip and ribbon clip/ ADH   BREAST BIOPSY Left 09/01/2023   MM LT BREAST BX W LOC DEV 1ST LESION IMAGE BX SPEC STEREO GUIDE 09/01/2023 [facility]   BREAST LUMPECTOMY     BREAST LUMPECTOMY WITH NEEDLE LOCALIZATION Left 02/14/2020   Procedure: BREAST LUMPECTOMY WITH NEEDLE LOCALIZATION;  Surgeon: Marshall Skeeter, MD;  Location: [facility] ORS;  Service: General;  Laterality: Left;   CHOLECYSTECTOMY  10/2020   COLONOSCOPY WITH PROPOFOL  N/A 10/18/2022   Procedure: COLONOSCOPY WITH PROPOFOL ;  Surgeon: Shane Darling, MD;  Location: [facility] ENDOSCOPY;  Service: Endoscopy;  Laterality: N/A;   FACIAL FRACTURE SURGERY     X 2   Family History  Problem Relation Age of Onset   Diabetes Mother    Heart disease Mother    Hypertension Mother    Cancer Father        prostate  Diverticulitis Father    CVA Father    Heart disease Father    Stroke Father    Hypertension Father    Heart murmur Father    Breast cancer Sister    Heart attack Brother    CVA Paternal Grandmother    Breast cancer Cousin    Social History   Socioeconomic History   Marital status: Married    Spouse name: Not on file   Number of children: 2   Years of education: Not on file   Highest education level: Not on file  Occupational History   Not on file  Tobacco Use   Smoking status: Never   Smokeless tobacco: Never  Vaping Use   Vaping status: Never Used  Substance and Sexual Activity   Alcohol use: No     Alcohol/week: 0.0 standard drinks of alcohol   Drug use: No   Sexual activity: Not Currently  Other Topics Concern   Not on file  Social History Narrative   Married   Works Part time   Social Drivers of Corporate investment banker Strain: Low Risk  (09/08/2023)   Received from [facility]   Overall Financial Resource Strain (CARDIA)    Difficulty of Paying Living Expenses: Not very hard  Food Insecurity: No Food Insecurity (09/08/2023)   Received from [facility]   Hunger Vital Sign    Worried About Running Out of Food in the Last Year: Never true    Ran Out of Food in the Last Year: Never true  Transportation Needs: No Transportation Needs (09/08/2023)   Received from [facility] - Transportation    In the past 12 months, has lack of transportation kept you from medical appointments or from getting medications?: No    Lack of Transportation (Non-Medical): No  Physical Activity: Inactive (02/07/2023)   Exercise Vital Sign    Days of Exercise per Week: 0 days    Minutes of Exercise per Session: 0 min  Stress: No Stress Concern Present (02/07/2023)   [employer] of Occupational Health - Occupational Stress Questionnaire    Feeling of Stress : Only a little  Social Connections: Socially Integrated (02/07/2023)   Social Connection and Isolation Panel [NHANES]    Frequency of Communication with Friends and Family: More than three times a week    Frequency of Social Gatherings with Friends and Family: More than three times a week    Attends Religious Services: More than 4 times per year    Active Member of [employer] or Organizations: Yes    Attends Banker Meetings: Never    Marital Status: Married     Review of Systems  Constitutional:  Negative for appetite change and unexpected weight change.  HENT:  Negative for congestion and sinus pressure.   Respiratory:  Negative for cough and chest tightness.         Breathing stable.   Cardiovascular:  Negative for chest pain, palpitations and leg swelling.  Gastrointestinal:  Negative for abdominal pain, diarrhea, nausea and vomiting.  Genitourinary:  Negative for difficulty urinating and dysuria.  Musculoskeletal:  Negative for joint swelling and myalgias.  Skin:  Negative for color change and rash.  Neurological:  Negative for dizziness and headaches.  Psychiatric/Behavioral:  Negative for agitation and dysphoric mood.        Objective:     BP 120/68   Pulse 76   Temp 98 F (36.7 C)  Resp 16   Ht 5\' 5"  (1.651 m)   Wt 187 lb (84.8 kg)   LMP 06/24/2010   SpO2 98%   BMI 31.12 kg/m  Wt Readings from Last 3 Encounters:  10/09/23 187 lb (84.8 kg)  06/23/23 184 lb (83.5 kg)  06/05/23 183 lb 12.8 oz (83.4 kg)    Physical Exam Vitals reviewed.  Constitutional:      General: She is not in acute distress.    Appearance: Normal appearance.  HENT:     Head: Normocephalic and atraumatic.     Right Ear: External ear normal.     Left Ear: External ear normal.     Mouth/Throat:     Pharynx: No oropharyngeal exudate or posterior oropharyngeal erythema.  Eyes:     General: No scleral icterus.       Right eye: No discharge.        Left eye: No discharge.     Conjunctiva/sclera: Conjunctivae normal.  Neck:     Thyroid : No thyromegaly.  Cardiovascular:     Rate and Rhythm: Normal rate and regular rhythm.  Pulmonary:     Effort: No respiratory distress.     Breath sounds: Normal breath sounds. No wheezing.     Comments: Breasts:  no nipple discharge or nipple retraction present. Palpable small nodule - left breast - around biopsy site. No pain. No axillary adenopathy.  Abdominal:     General: Bowel sounds are normal.     Palpations: Abdomen is soft.     Tenderness: There is no abdominal tenderness.  Musculoskeletal:        General: No swelling or tenderness.     Cervical back: Neck supple. No tenderness.  Lymphadenopathy:      Cervical: No cervical adenopathy.  Skin:    Findings: No erythema or rash.  Neurological:     Mental Status: She is alert.  Psychiatric:        Mood and Affect: Mood normal.        Behavior: Behavior normal.         Outpatient Encounter Medications as of 10/09/2023  Medication Sig   aspirin  EC 81 MG EC tablet Take 1 tablet (81 mg total) by mouth daily.   atorvastatin  (LIPITOR) 40 MG tablet Take 1 tablet (40 mg total) by mouth daily.   clotrimazole -betamethasone  (LOTRISONE ) cream Apply 1 Application topically 2 (two) times daily. Apply to left axilla   furosemide  (LASIX ) 20 MG tablet Take 1 tablet (20 mg total) by mouth daily as needed (leg swelling).   lactobacillus acidophilus (BACID) TABS tablet Take 1 tablet by mouth daily.   magnesium gluconate (MAGONATE) 500 MG tablet Take 500 mg by mouth every other day.   mavacamten  (CAMZYOS ) 5 MG CAPS capsule Take 1 capsule (5 mg total) by mouth daily.   metoprolol  succinate (TOPROL -XL) 100 MG 24 hr tablet Take 1 tablet (100 mg total) by mouth daily. Take with or immediately following a meal.   Multiple Vitamin (MULTIVITAMIN) tablet Take 1 tablet by mouth daily.   mupirocin  ointment (BACTROBAN ) 2 % Apply 1 Application topically 2 (two) times daily. Around nose.   sertraline  (ZOLOFT ) 50 MG tablet Take 1 tablet (50 mg total) by mouth daily.   tamoxifen (NOLVADEX) 20 MG tablet Take 20 mg by mouth daily.   tobramycin-dexamethasone  (TOBRADEX ) ophthalmic ointment Place 1 Application into the left eye 2 (two) times daily. (Patient taking differently: Place 1 Application into the left eye as needed.)   Vitamin D , Cholecalciferol, 10  MCG (400 UNIT) CAPS Take by mouth.   zinc gluconate 50 MG tablet Take 50 mg by mouth every other day.   [DISCONTINUED] atorvastatin  (LIPITOR) 40 MG tablet Take 1 tablet (40 mg total) by mouth daily.   [DISCONTINUED] sertraline  (ZOLOFT ) 50 MG tablet Take 1 tablet (50 mg total) by mouth daily.   No facility-administered  encounter medications on file as of 10/09/2023.     Lab Results  Component Value Date   WBC 7.4 07/02/2023   HGB 14.8 07/02/2023   HCT 44.2 07/02/2023   PLT 165.0 07/02/2023   GLUCOSE 100 (H) 10/03/2023   CHOL 112 10/03/2023   TRIG 152.0 (H) 10/03/2023   HDL 36.00 (L) 10/03/2023   LDLDIRECT 57 01/19/2020   LDLCALC 46 10/03/2023   ALT 34 10/03/2023   AST 27 10/03/2023   NA 141 10/03/2023   K 4.1 10/03/2023   CL 108 10/03/2023   CREATININE 0.80 10/03/2023   BUN 17 10/03/2023   CO2 27 10/03/2023   TSH 2.08 07/02/2023   INR 1.0 11/19/2019   HGBA1C 5.2 03/30/2013    MM LT BREAST BX W LOC DEV 1ST LESION IMAGE BX SPEC STEREO GUIDE Addendum Date: 09/02/2023 ADDENDUM REPORT: 09/02/2023 11:49 ADDENDUM: PATHOLOGY revealed: 1. Breast, left, needle core biopsy, upper outer calcifications : BENIGN BREAST TISSUE WITH FAT NECROSIS, PSEUDOCYST FORMATION AND CALCIFICATIONS. FOCAL USUAL DUCTAL HYPERPLASIA AND ADENOSIS. NEGATIVE FOR ATYPIA OR MALIGNANCY. Pathology results are CONCORDANT with imaging findings, per Dr. Mercie Stalker. Pathology results and recommendations were discussed with patient via telephone on 09/02/2023 by Ladonna Pickup RN. Patient reported biopsy site doing well with no adverse symptoms, and only slight tenderness at the site. Post biopsy care instructions were reviewed, questions were answered and my direct phone number was provided. Patient was instructed to call [facility] for any additional questions or concerns related to biopsy site. RECOMMENDATION: Patient instructed to resume annual bilateral screening mammogram due February 2026. Pathology results reported by Ladonna Pickup RN on 09/02/2023. Electronically Signed   By: Mercie Stalker M.D.   On: 09/02/2023 11:49   Result Date: 09/02/2023 CLINICAL DATA:  Indeterminate calcifications in the upper-outer left breast at middle depth. EXAM: LEFT BREAST STEREOTACTIC CORE NEEDLE BIOPSY COMPARISON:  Previous exam(s). FINDINGS: The  patient and I discussed the procedure of stereotactic-guided biopsy including benefits and alternatives. We discussed the high likelihood of a successful procedure. We discussed the risks of the procedure including infection, bleeding, tissue injury, clip migration, and inadequate sampling. Informed written consent was given. The usual time out protocol was performed immediately prior to the procedure. Using sterile technique and 1% Lidocaine  as local anesthetic, under stereotactic guidance, a 9 gauge vacuum assisted device was used to perform core needle biopsy of calcifications in the upper outer left breast at middle depth using a lateral approach. Specimen radiograph was performed showing representative calcifications in 4 of the 6 samples. Specimens with calcifications are identified for pathology. Lesion quadrant: Upper outer quadrant At the conclusion of the procedure, a coil shaped tissue marker clip was deployed into the biopsy cavity. Follow-up 2-view mammogram was performed and dictated separately. IMPRESSION: Stereotactic-guided biopsy of calcifications in the upper outer left breast at middle depth. No apparent complications. Electronically Signed: By: Mercie Stalker M.D. On: 09/01/2023 11:48   MM CLIP PLACEMENT LEFT Result Date: 09/01/2023 CLINICAL DATA:  Postprocedure mammogram. EXAM: 3D DIAGNOSTIC LEFT MAMMOGRAM POST STEREOTACTIC BIOPSY COMPARISON:  Previous exam(s). ACR Breast Density Category b: There are scattered areas of fibroglandular  density. FINDINGS: 3D Mammographic images were obtained following stereotactic guided biopsy of calcifications in the upper outer left breast at middle depth. The biopsy marking clip is in expected position at the site of biopsy. IMPRESSION: Appropriate positioning of the coil shaped biopsy marking clip at the site of biopsy in the upper outer left breast at middle depth. Final Assessment: Post Procedure Mammograms for Marker Placement Electronically Signed   By:  Mercie Stalker M.D.   On: 09/01/2023 11:53       Assessment & Plan:  Health care maintenance Assessment & Plan: Physical today 10/09/23.  Colonoscopy 11/2016 - few small polyps and internal hemorrhoids.  Recommended f/u colonoscopy in 10 years.  Mammogram 08/21/23 - recommended f/u views and subsequent biopsy.  S/p breast biopsy 09/01/23 - ok. Recommended annual screening mammogram - due 07/2024.    Mixed hyperlipidemia Assessment & Plan: Continue lipitor. Continue low cholesterol diet and exercise. Follow lipid panel and liver function tests.   Orders: -     Lipid panel; Future -     Hepatic function panel; Future -     Basic metabolic panel with GFR; Future  Abnormal liver function test Assessment & Plan: Liver panel checked 10/03/23 - wnl.    Coronary artery disease of native artery of native heart with stable angina pectoris [facility]) Assessment & Plan: Previously evaluated by cardiology for chest pain with minimal exertion.  To review, recommended f/u CTA and cardiac monitor.  Monitor - Paroxysmal SVTs, 3 short runs of VT lasting 4 beats noted. Overall no significant/persistent arrhythmias. CTA - Stable 4 mm pulmonary nodule in the right lower lobe. This is stable since June 2021 and is likely benign. No follow-up imaging recommended. Coronary calcium  score of 216.  Calcified plaque - proximal LAD mild stenosis - 35-49%.  Minimal stenosis - ostial RCA - mild non obstructive CAD.  Recommended continuing aspirin , lipitor and toprol .  MRCM - findings c/w hypertrophic obstructive cardiomyopathy.  Felt better with increase in toprol .  Saw Dr Paulita Boss for evaluation of hypertrophic cardiomyopathy - to assess - started mavacamten .  Feels better.  SOB has improved. Had f/u with cardiology 03/14/23 - f/u hypertrophic cardiomyopathy. Continues on mavacamten . Stable. ECHO 03/2023 - stable. Had f/u ECHO 09/2023 - EF 63%, Grade I DD, no significant valve abnormality. Currently stable. No chest pain.  Breathing stable.    Ductal carcinoma in situ (DCIS) of left breast Assessment & Plan: S/p excision and XRT.  Now followed by Dr Rosea Conch. Saw Dr Marquita Situ - 08/28/21 - tamoxifen for 5 years total.(dx 01/2020)  F/u one year diagnostic mammogram.    Mammogram 07/2022 - Birads II.  Had f/u with Dr Rosea Conch 08/26/23 - S/p breast biopsy 09/01/23 - ok. Recommended annual screening mammogram - due 07/2024.  She is noticing a persistent nodule left breast - around biopsy site. Discussed possible scar tissue. Will have surgery evaluate. Appt for Monday 10/13/23 with Dr Rosea Conch.    Endometrial thickening on ultrasound Assessment & Plan: Saw Dr Cleora Daft 07/25/23 - endometrial thickening on ultrasound - stable. Continue to follow. Recommended f/u in 8 months.    Essential hypertension Assessment & Plan: Continue metoprolol .  Blood pressure as outlined.  No changes in medication. Follow pressures. Follow metabolic panel.    Hematuria, unspecified type Assessment & Plan: Evaluated by urology - 02/28/21 - cystoscopy - normal.  Follow urine. CT urogram if hematuria worsens or if develops gross hematuria.  F/u two years. Needs f/u with urology or f/u urinalysis.  HOCM (hypertrophic obstructive cardiomyopathy) (HCC) Assessment & Plan: Had f/u with cardiology 03/14/23 - f/u hypertrophic cardiomyopathy. Continues on mavacamten .  Stable. ECHO 03/2023 - stable. Had f/u ECHO 09/2023 - EF 63%, Grade I DD, no significant valve abnormality. Breathing stable. Continue f/u with cardiology.    Stress Assessment & Plan: Remains on zoloft . Stable. Follow.    Thrombocytopenia (HCC) Assessment & Plan: Follow cbc.    Primary hyperparathyroidism [facility]) Assessment & Plan: Evaluated by endocrinology. Recent calcium  10.5. plan for f/u bone density 07/2024.    Other orders -     Atorvastatin  Calcium ; Take 1 tablet (40 mg total) by mouth daily.  Dispense: 90 tablet; Refill: 3 -     Sertraline  HCl; Take 1 tablet (50 mg total) by mouth  daily.  Dispense: 90 tablet; Refill: 1     Dellar Fenton, MD

## 2023-10-11 ENCOUNTER — Encounter: Payer: Self-pay | Admitting: Internal Medicine

## 2023-10-11 DIAGNOSIS — E210 Primary hyperparathyroidism: Secondary | ICD-10-CM | POA: Insufficient documentation

## 2023-10-11 NOTE — Assessment & Plan Note (Signed)
 S/p excision and XRT.  Now followed by Dr Rosea Conch. Saw Dr Marquita Situ - 08/28/21 - tamoxifen for 5 years total.(dx 01/2020)  F/u one year diagnostic mammogram.    Mammogram 07/2022 - Birads II.  Had f/u with Dr Rosea Conch 08/26/23 - S/p breast biopsy 09/01/23 - ok. Recommended annual screening mammogram - due 07/2024.  She is noticing a persistent nodule left breast - around biopsy site. Discussed possible scar tissue. Will have surgery evaluate. Appt for Monday 10/13/23 with Dr Rosea Conch.

## 2023-10-11 NOTE — Assessment & Plan Note (Signed)
 Remains on zoloft . Stable. Follow.

## 2023-10-11 NOTE — Assessment & Plan Note (Signed)
 Evaluated by endocrinology. Recent calcium  10.5. plan for f/u bone density 07/2024.

## 2023-10-11 NOTE — Assessment & Plan Note (Signed)
 Follow cbc.

## 2023-10-11 NOTE — Assessment & Plan Note (Signed)
Continue metoprolol.  Blood pressure as outlined.  No changes in medication.  Follow pressures.  Follow metabolic panel.  

## 2023-10-11 NOTE — Assessment & Plan Note (Signed)
 Evaluated by urology - 02/28/21 - cystoscopy - normal.  Follow urine. CT urogram if hematuria worsens or if develops gross hematuria.  F/u two years. Needs f/u with urology or f/u urinalysis.

## 2023-10-11 NOTE — Assessment & Plan Note (Signed)
 Had f/u with cardiology 03/14/23 - f/u hypertrophic cardiomyopathy. Continues on mavacamten .  Stable. ECHO 03/2023 - stable. Had f/u ECHO 09/2023 - EF 63%, Grade I DD, no significant valve abnormality. Breathing stable. Continue f/u with cardiology.

## 2023-10-11 NOTE — Assessment & Plan Note (Signed)
 Liver panel checked 10/03/23 - wnl.

## 2023-10-11 NOTE — Assessment & Plan Note (Signed)
 Continue lipitor. Continue low cholesterol diet and exercise. Follow lipid panel and liver function tests.

## 2023-10-11 NOTE — Assessment & Plan Note (Signed)
 Previously evaluated by cardiology for chest pain with minimal exertion.  To review, recommended f/u CTA and cardiac monitor.  Monitor - Paroxysmal SVTs, 3 short runs of VT lasting 4 beats noted. Overall no significant/persistent arrhythmias. CTA - Stable 4 mm pulmonary nodule in the right lower lobe. This is stable since June 2021 and is likely benign. No follow-up imaging recommended. Coronary calcium  score of 216.  Calcified plaque - proximal LAD mild stenosis - 35-49%.  Minimal stenosis - ostial RCA - mild non obstructive CAD.  Recommended continuing aspirin , lipitor and toprol .  MRCM - findings c/w hypertrophic obstructive cardiomyopathy.  Felt better with increase in toprol .  Saw Dr Paulita Boss for evaluation of hypertrophic cardiomyopathy - to assess - started mavacamten .  Feels better.  SOB has improved. Had f/u with cardiology 03/14/23 - f/u hypertrophic cardiomyopathy. Continues on mavacamten . Stable. ECHO 03/2023 - stable. Had f/u ECHO 09/2023 - EF 63%, Grade I DD, no significant valve abnormality. Currently stable. No chest pain. Breathing stable.

## 2023-10-11 NOTE — Assessment & Plan Note (Signed)
 Saw Dr Cleora Daft 07/25/23 - endometrial thickening on ultrasound - stable. Continue to follow. Recommended f/u in 8 months.

## 2023-10-13 DIAGNOSIS — N6321 Unspecified lump in the left breast, upper outer quadrant: Secondary | ICD-10-CM | POA: Diagnosis not present

## 2023-12-19 ENCOUNTER — Ambulatory Visit (HOSPITAL_COMMUNITY)
Admission: RE | Admit: 2023-12-19 | Discharge: 2023-12-19 | Disposition: A | Discharge status: Home / Self Care | Source: Ambulatory Visit | Attending: Internal Medicine | Admitting: Internal Medicine

## 2023-12-19 DIAGNOSIS — I421 Obstructive hypertrophic cardiomyopathy: Secondary | ICD-10-CM

## 2023-12-19 LAB — ECHOCARDIOGRAM LIMITED: S' Lateral: 2.8 cm

## 2023-12-21 ENCOUNTER — Ambulatory Visit: Payer: Self-pay | Admitting: Internal Medicine

## 2023-12-21 DIAGNOSIS — I421 Obstructive hypertrophic cardiomyopathy: Secondary | ICD-10-CM

## 2023-12-22 MED ORDER — CAMZYOS 5 MG PO CAPS: 5.0000 mg | ORAL_CAPSULE | Freq: Every day | ORAL | 5 refills | Status: DC

## 2023-12-22 NOTE — Telephone Encounter (Signed)
 The patient has been notified of the result and verbalized understanding.  All questions (if any) were answered. Mary SAILOR Talar Fraley, RN 12/22/2023 10:10 AM     PT reports has not started any new medications.  Has noted swelling to right ankle.  Has propped leg up to help with swelling.  Reports 25 lb weight gain in a little over a year. No increased salt intake.  No recent gain of 2-3 lbs in 24 hours or 5 lbs in a week.  Takes furosemide  20 mg PO every day not PRN.  BP avg 120.  Advised will send to MD to review.

## 2023-12-22 NOTE — Telephone Encounter (Signed)
 PSF updated on Camzyos  REMS portal.  Mavacamten  (Camzyos ) 5 mg refilled with 5 fills available to pt.

## 2024-01-08 ENCOUNTER — Other Ambulatory Visit: Payer: Self-pay | Admitting: Pharmacist

## 2024-01-08 MED ORDER — CAMZYOS 5 MG PO CAPS: 5.0000 mg | ORAL_CAPSULE | Freq: Every day | ORAL | 5 refills | Status: DC

## 2024-01-15 DIAGNOSIS — M3500 Sicca syndrome, unspecified: Secondary | ICD-10-CM | POA: Diagnosis not present

## 2024-01-15 DIAGNOSIS — Z008 Encounter for other general examination: Secondary | ICD-10-CM | POA: Diagnosis not present

## 2024-01-15 DIAGNOSIS — R32 Unspecified urinary incontinence: Secondary | ICD-10-CM | POA: Diagnosis not present

## 2024-01-15 DIAGNOSIS — I10 Essential (primary) hypertension: Secondary | ICD-10-CM | POA: Diagnosis not present

## 2024-01-15 DIAGNOSIS — I422 Other hypertrophic cardiomyopathy: Secondary | ICD-10-CM | POA: Diagnosis not present

## 2024-01-15 DIAGNOSIS — Z8249 Family history of ischemic heart disease and other diseases of the circulatory system: Secondary | ICD-10-CM | POA: Diagnosis not present

## 2024-01-15 DIAGNOSIS — D696 Thrombocytopenia, unspecified: Secondary | ICD-10-CM | POA: Diagnosis not present

## 2024-01-15 DIAGNOSIS — E669 Obesity, unspecified: Secondary | ICD-10-CM | POA: Diagnosis not present

## 2024-01-15 DIAGNOSIS — E213 Hyperparathyroidism, unspecified: Secondary | ICD-10-CM | POA: Diagnosis not present

## 2024-01-15 DIAGNOSIS — I25119 Atherosclerotic heart disease of native coronary artery with unspecified angina pectoris: Secondary | ICD-10-CM | POA: Diagnosis not present

## 2024-01-15 DIAGNOSIS — E785 Hyperlipidemia, unspecified: Secondary | ICD-10-CM | POA: Diagnosis not present

## 2024-01-15 DIAGNOSIS — F325 Major depressive disorder, single episode, in full remission: Secondary | ICD-10-CM | POA: Diagnosis not present

## 2024-01-15 DIAGNOSIS — D84821 Immunodeficiency due to drugs: Secondary | ICD-10-CM | POA: Diagnosis not present

## 2024-02-10 ENCOUNTER — Other Ambulatory Visit (INDEPENDENT_AMBULATORY_CARE_PROVIDER_SITE_OTHER)

## 2024-02-10 ENCOUNTER — Telehealth: Payer: Self-pay

## 2024-02-10 DIAGNOSIS — E782 Mixed hyperlipidemia: Secondary | ICD-10-CM

## 2024-02-10 LAB — BASIC METABOLIC PANEL WITH GFR
BUN: 11 mg/dL (ref 6–23)
CO2: 27 meq/L (ref 19–32)
Calcium: 10.3 mg/dL (ref 8.4–10.5)
Chloride: 108 meq/L (ref 96–112)
Creatinine, Ser: 0.84 mg/dL (ref 0.40–1.20)
GFR: 71 mL/min (ref >60.00)
Glucose, Bld: 110 mg/dL — ABNORMAL HIGH (ref 70–99)
Potassium: 3.9 meq/L (ref 3.5–5.1)
Sodium: 142 meq/L (ref 135–145)

## 2024-02-10 LAB — HEPATIC FUNCTION PANEL
ALT: 33 U/L (ref 0–35)
AST: 26 U/L (ref 0–37)
Albumin: 3.9 g/dL (ref 3.5–5.2)
Alkaline Phosphatase: 78 U/L (ref 39–117)
Bilirubin, Direct: 0.2 mg/dL (ref 0.0–0.3)
Total Bilirubin: 0.7 mg/dL (ref 0.2–1.2)
Total Protein: 6 g/dL (ref 6.0–8.3)

## 2024-02-10 LAB — LIPID PANEL
Cholesterol: 114 mg/dL (ref 0–200)
HDL: 35 mg/dL — ABNORMAL LOW (ref >39.00)
LDL Cholesterol: 47 mg/dL (ref 0–99)
NonHDL: 79.32
Total CHOL/HDL Ratio: 3
Triglycerides: 162 mg/dL — ABNORMAL HIGH (ref 0.0–149.0)
VLDL: 32.4 mg/dL (ref 0.0–40.0)

## 2024-02-10 NOTE — Telephone Encounter (Signed)
 Called pt left a message asking if can come in for an OV for HOCM f/u with Orren Fabry, PA on 03/15/24.  Asked that pt call or send a My Chart message with response.

## 2024-02-11 ENCOUNTER — Ambulatory Visit: Payer: Self-pay | Admitting: Internal Medicine

## 2024-02-12 ENCOUNTER — Ambulatory Visit: Admitting: Internal Medicine

## 2024-02-13 NOTE — Telephone Encounter (Signed)
Patient calling back for update. Please advise  

## 2024-02-13 NOTE — Telephone Encounter (Signed)
 Called and spoke to patient and appt made 9/22 @3 :35 with Orren Fabry. Understanding verbalized.

## 2024-02-19 ENCOUNTER — Ambulatory Visit (INDEPENDENT_AMBULATORY_CARE_PROVIDER_SITE_OTHER): Admitting: Internal Medicine

## 2024-02-19 VITALS — BP 126/72 | HR 80 | Resp 16 | Ht 65.0 in | Wt 185.8 lb

## 2024-02-19 DIAGNOSIS — E210 Primary hyperparathyroidism: Secondary | ICD-10-CM | POA: Diagnosis not present

## 2024-02-19 DIAGNOSIS — R319 Hematuria, unspecified: Secondary | ICD-10-CM | POA: Diagnosis not present

## 2024-02-19 DIAGNOSIS — R9389 Abnormal findings on diagnostic imaging of other specified body structures: Secondary | ICD-10-CM

## 2024-02-19 DIAGNOSIS — R739 Hyperglycemia, unspecified: Secondary | ICD-10-CM

## 2024-02-19 DIAGNOSIS — E782 Mixed hyperlipidemia: Secondary | ICD-10-CM | POA: Diagnosis not present

## 2024-02-19 DIAGNOSIS — I25118 Atherosclerotic heart disease of native coronary artery with other forms of angina pectoris: Secondary | ICD-10-CM | POA: Diagnosis not present

## 2024-02-19 DIAGNOSIS — D696 Thrombocytopenia, unspecified: Secondary | ICD-10-CM

## 2024-02-19 DIAGNOSIS — D0512 Intraductal carcinoma in situ of left breast: Secondary | ICD-10-CM

## 2024-02-19 DIAGNOSIS — I421 Obstructive hypertrophic cardiomyopathy: Secondary | ICD-10-CM | POA: Diagnosis not present

## 2024-02-19 DIAGNOSIS — I10 Essential (primary) hypertension: Secondary | ICD-10-CM | POA: Diagnosis not present

## 2024-02-19 DIAGNOSIS — F439 Reaction to severe stress, unspecified: Secondary | ICD-10-CM | POA: Diagnosis not present

## 2024-02-19 MED ORDER — SERTRALINE HCL 50 MG PO TABS: 50.0000 mg | ORAL_TABLET | Freq: Every day | ORAL | 1 refills | Status: DC

## 2024-02-19 NOTE — Progress Notes (Signed)
 Subjective:    [name], female    DOB: [birth year], 68 y.o.   MRN: [id number]  Patient here for  Chief Complaint  Patient presents with   Medical Management of Chronic Issues    HPI Here for a scheduled follow up. Evaluated by endocrinology for hypercalcemia. Labs are c/w primary hyperparathyroidism. Continuing to monitor. Recommended f/u bond density 07/2024. Had f/u with Dr Tye 08/26/23 - f/u breast cancer s/p left lumpectomy 2021. Continues on tamoxifen. Recommended f/u colonoscopy 2028. S/p breast biopsy 09/01/23 - ok. Recommended annual screening mammogram - due 07/2024. Had f/u with Dr Tye - 10/13/23 - s/p f/u biopsy - liely scar tissue from previous excision. Recommended f/u in one year from previous mammogram. Saw Dr Leonce 07/25/23 - endometrial thickening on ultrasound - stable. Continue to follow. Recommended f/u in 8 months.  Had f/u with cardiology 03/14/23 - f/u hypertrophic cardiomyopathy. Continues on mavacamten . Stable. ECHO 03/2023 - stable. Had f/u ECHO 09/2023 - EF 63%, Grade I DD, no significant valve abnormality. Had f/u ECHO 12/21/23 - EF 60%. Slight increase in your LVOT gradient to 23mm Hg with valsalva. Breathing overall appears to be stable. No abdominal pain or bowel change. Right lateral ankle -swelling. No increased swelling up leg. No increased erythema.    Past Medical History:  Diagnosis Date   Breast cancer (HCC)    Chest pain    Dyspnea    Family history of adverse reaction to anesthesia    SON-LUNGS FILLED UP WITH FLUID AFTER APPENDECTOMY   Hematuria    HOCM (hypertrophic obstructive cardiomyopathy) (HCC)    a. 10/2019 Echo: EF 70-75%, mod LVH. No rwma. Mild SAM, mild LVOT obs(peak grad 20 @ rest 26 w/ Valsalva); b. 01/2022 cMRI: EF 73%, sev asymm LV basal septal hypertrophy- basal segment 1.6cm. Transmural LGE/scar in LV basal segment (point of max thickness & RV insertion). SAM->mild MR & LVOT obs-->HOCM.   Hypercholesterolemia     Hypertension    Nonobstructive CAD (coronary artery disease)    a.10/2015 MV: No ischemia/infarct; b. 11/2019 Cor CTA: mild nonobs LAD dzs; c. 12/2021 Cor CTA: Ca2+ = 216 (87th%'ile). LM nl, LAD 25-49p, LCX nondom, nl, RCA <25ost.   Palpitations    a. 12/2021 Zio: RSR @ 72 (52-182). 3 runs NSVT (longest 5 beats, fastest 152 x 4 beats). 10 SVT runs (longest 15.1 secs @ 103, fastest 4 beats @ 182). Rare PACs/PVCs.   Personal history of radiation therapy    Pulmonary nodule    a. 12/2021 CT chest: stable 4mm RLL pulm nodule, likely benign, No f/u imaging req.   Systolic murmur    Past Surgical History:  Procedure Laterality Date   BREAST BIOPSY Left 10/27/2019   Affirm bx-distortion-     clip-path ADH   BREAST BIOPSY Left 01/07/2020   Affim bx 1 area/ x clip and ribbon clip/ ADH   BREAST BIOPSY Left 09/01/2023   MM LT BREAST BX W LOC DEV 1ST LESION IMAGE BX SPEC STEREO GUIDE 09/01/2023 [facility]   BREAST LUMPECTOMY     BREAST LUMPECTOMY WITH NEEDLE LOCALIZATION Left 02/14/2020   Procedure: BREAST LUMPECTOMY WITH NEEDLE LOCALIZATION;  Surgeon: Dessa Reyes ORN, MD;  Location: [facility];  Service: General;  Laterality: Left;   CHOLECYSTECTOMY  10/2020   COLONOSCOPY WITH PROPOFOL  N/A 10/18/2022   Procedure: COLONOSCOPY WITH PROPOFOL ;  Surgeon: Maryruth Ole DASEN, MD;  Location: [facility];  Service: Endoscopy;  Laterality: N/A;   FACIAL FRACTURE SURGERY  X 2   Family History  Problem Relation Age of Onset   Diabetes Mother    Heart disease Mother    Hypertension Mother    Cancer Father        prostate    Diverticulitis Father    CVA Father    Heart disease Father    Stroke Father    Hypertension Father    Heart murmur Father    Breast cancer Sister    Heart attack Brother    CVA Paternal Grandmother    Breast cancer Cousin    Social History   Socioeconomic History   Marital status: Married    Spouse name: Not on file   Number of children: 2   Years of education:  Not on file   Highest education level: Not on file  Occupational History   Not on file  Tobacco Use   Smoking status: Never   Smokeless tobacco: Never  Vaping Use   Vaping status: Never Used  Substance and Sexual Activity   Alcohol use: No    Alcohol/week: 0.0 standard drinks of alcohol   Drug use: No   Sexual activity: Not Currently  Other Topics Concern   Not on file  Social History Narrative   Married   Works Part time   Social Drivers of Corporate investment banker Strain: Low Risk  (09/08/2023)   Received from [facility]   Overall Financial Resource Strain (CARDIA)    Difficulty of Paying Living Expenses: Not very hard  Food Insecurity: No Food Insecurity (09/08/2023)   Received from [facility]   Hunger Vital Sign    Within the past 12 months, you worried that your food would run out before you got the money to buy more.: Never true    Within the past 12 months, the food you bought just didn't last and you didn't have money to get more.: Never true  Transportation Needs: No Transportation Needs (09/08/2023)   Received from [facility] - Transportation    In the past 12 months, has lack of transportation kept you from medical appointments or from getting medications?: No    Lack of Transportation (Non-Medical): No  Physical Activity: Inactive (02/07/2023)   Exercise Vital Sign    Days of Exercise per Week: 0 days    Minutes of Exercise per Session: 0 min  Stress: No Stress Concern Present (02/07/2023)   [employer] of Occupational Health - Occupational Stress Questionnaire    Feeling of Stress : Only a little  Social Connections: Socially Integrated (02/07/2023)   Social Connection and Isolation Panel    Frequency of Communication with Friends and Family: More than three times a week    Frequency of Social Gatherings with Friends and Family: More than three times a week    Attends Religious Services:  More than 4 times per year    Active Member of [employer] or Organizations: Yes    Attends Banker Meetings: Never    Marital Status: Married     Review of Systems  Constitutional:  Negative for appetite change and unexpected weight change.  HENT:  Negative for congestion and sinus pressure.   Respiratory:  Negative for cough and chest tightness.        Breathing stable.   Cardiovascular:  Negative for chest pain and palpitations.       Some right lateral ankle swelling. No increased leg swelling. No erythema.  Gastrointestinal:  Negative for abdominal pain, diarrhea, nausea and vomiting.  Genitourinary:  Negative for difficulty urinating and dysuria.  Musculoskeletal:  Negative for joint swelling and myalgias.  Skin:  Negative for color change and rash.  Neurological:  Negative for dizziness and headaches.  Psychiatric/Behavioral:  Negative for agitation and dysphoric mood.        Objective:     BP 126/72   Pulse 80   Resp 16   Ht 5' 5 (1.651 m)   Wt 185 lb 12.8 oz (84.3 kg)   LMP 06/24/2010   SpO2 98%   BMI 30.92 kg/m  Wt Readings from Last 3 Encounters:  02/19/24 185 lb 12.8 oz (84.3 kg)  10/09/23 187 lb (84.8 kg)  06/23/23 184 lb (83.5 kg)    Physical Exam Vitals reviewed.  Constitutional:      General: She is not in acute distress.    Appearance: Normal appearance.  HENT:     Head: Normocephalic and atraumatic.     Right Ear: External ear normal.     Left Ear: External ear normal.     Mouth/Throat:     Pharynx: No oropharyngeal exudate or posterior oropharyngeal erythema.  Eyes:     General: No scleral icterus.       Right eye: No discharge.        Left eye: No discharge.     Conjunctiva/sclera: Conjunctivae normal.  Neck:     Thyroid : No thyromegaly.  Cardiovascular:     Rate and Rhythm: Normal rate and regular rhythm.  Pulmonary:     Effort: No respiratory distress.     Breath sounds: Normal breath sounds. No wheezing.  Abdominal:      General: Bowel sounds are normal.     Palpations: Abdomen is soft.     Tenderness: There is no abdominal tenderness.  Musculoskeletal:        General: No tenderness.     Cervical back: Neck supple. No tenderness.     Comments: No significant increased swelling. No redness. No increased pain.   Lymphadenopathy:     Cervical: No cervical adenopathy.  Skin:    Findings: No erythema or rash.  Neurological:     Mental Status: She is alert.  Psychiatric:        Mood and Affect: Mood normal.        Behavior: Behavior normal.         Outpatient Encounter Medications as of 02/19/2024  Medication Sig   aspirin  EC 81 MG EC tablet Take 1 tablet (81 mg total) by mouth daily.   atorvastatin  (LIPITOR) 40 MG tablet Take 1 tablet (40 mg total) by mouth daily.   clotrimazole -betamethasone  (LOTRISONE ) cream Apply 1 Application topically 2 (two) times daily. Apply to left axilla   furosemide  (LASIX ) 20 MG tablet Take 1 tablet (20 mg total) by mouth daily as needed (leg swelling).   lactobacillus acidophilus (BACID) TABS tablet Take 1 tablet by mouth daily.   magnesium gluconate (MAGONATE) 500 MG tablet Take 500 mg by mouth every other day.   mavacamten  (CAMZYOS ) 5 MG CAPS capsule Take 1 capsule (5 mg total) by mouth daily.   metoprolol  succinate (TOPROL -XL) 100 MG 24 hr tablet Take 1 tablet (100 mg total) by mouth daily. Take with or immediately following a meal.   Multiple Vitamin (MULTIVITAMIN) tablet Take 1 tablet by mouth daily.   mupirocin  ointment (BACTROBAN ) 2 % Apply 1 Application topically 2 (two) times daily. Around nose.   sertraline  (ZOLOFT )  50 MG tablet Take 1 tablet (50 mg total) by mouth daily.   tamoxifen (NOLVADEX) 20 MG tablet Take 20 mg by mouth daily.   tobramycin-dexamethasone  (TOBRADEX ) ophthalmic ointment Place 1 Application into the left eye 2 (two) times daily. (Patient taking differently: Place 1 Application into the left eye as needed.)   Vitamin D , Cholecalciferol, 10 MCG  (400 UNIT) CAPS Take by mouth.   zinc gluconate 50 MG tablet Take 50 mg by mouth every other day.   [DISCONTINUED] sertraline  (ZOLOFT ) 50 MG tablet Take 1 tablet (50 mg total) by mouth daily.   No facility-administered encounter medications on file as of 02/19/2024.     Lab Results  Component Value Date   WBC 7.4 07/02/2023   HGB 14.8 07/02/2023   HCT 44.2 07/02/2023   PLT 165.0 07/02/2023   GLUCOSE 110 (H) 02/10/2024   CHOL 114 02/10/2024   TRIG 162.0 (H) 02/10/2024   HDL 35.00 (L) 02/10/2024   LDLDIRECT 57 01/19/2020   LDLCALC 47 02/10/2024   ALT 33 02/10/2024   AST 26 02/10/2024   NA 142 02/10/2024   K 3.9 02/10/2024   CL 108 02/10/2024   CREATININE 0.84 02/10/2024   BUN 11 02/10/2024   CO2 27 02/10/2024   TSH 2.08 07/02/2023   INR 1.0 11/19/2019   HGBA1C 5.2 03/30/2013    ECHOCARDIOGRAM LIMITED Result Date: 12/19/2023    ECHOCARDIOGRAM LIMITED REPORT   Patient Name:   KAILYNN SATTERLY Date of Exam: 12/19/2023 Medical Rec #:  [id number]          Height:       65.0 in Accession #:    [id number]         Weight:       187.0 lb Date of Birth:  [birth year]          BSA:          1.922 m Patient Age:    58 years           BP:           140/80 mmHg Patient Gender: F                  HR:           75 bpm. Exam Location:  [facility] Procedure: Limited Echo, Limited Color Doppler, Cardiac Doppler, 3D Echo and            Strain Analysis (Both Spectral and Color Flow Doppler were utilized            during procedure). Indications:    HOCM (hypertrophic obstructive cardiomyopathy) I42.1; 5MG                  [place]   History:        Patient has prior history of Echocardiogram examinations, most                 recent 09/26/2023. CAD, Signs/Symptoms:Murmur; Risk                 Factors:Hypertension.  Sonographer:    Augustin Seals RDCS Referring Phys: [id number] Se [facility] A CHANDRASEKHAR IMPRESSIONS  1. Left ventricular ejection fraction, by estimation, is 65 to 70%. Left ventricular ejection  fraction by 3D volume is 60 %. The left ventricle has normal function. There is mild concentric left ventricular hypertrophy of the basal-septal segment. The average left ventricular global longitudinal strain is -18.9 %. The global longitudinal strain is normal.  2. There is mild chordal  SAM with a max intracavitary gradient of (Valsalva).  3. Right ventricular systolic function is normal. The right ventricular size is normal.  4. Mild mitral valve regurgitation.  5. The aortic valve is normal in structure. Aortic valve regurgitation is not visualized. FINDINGS  Left Ventricle: Left ventricular ejection fraction, by estimation, is 65 to 70%. Left ventricular ejection fraction by 3D volume is 60 %. The left ventricle has normal function. The average left ventricular global longitudinal strain is -18.9 %. Strain was performed and the global longitudinal strain is normal. There is mild concentric left ventricular hypertrophy of the basal-septal segment. Right Ventricle: The right ventricular size is normal. Right vetricular wall thickness was not well visualized. Right ventricular systolic function is normal. Mitral Valve: Mild mitral valve regurgitation. Tricuspid Valve: The tricuspid valve is normal in structure. Tricuspid valve regurgitation is not demonstrated. Aortic Valve: The aortic valve is normal in structure. Aortic valve regurgitation is not visualized. Pulmonic Valve: The pulmonic valve was not well visualized. Pulmonic valve regurgitation is not visualized. Aorta: The aortic root is normal in size and structure. LEFT VENTRICLE PLAX 2D LVIDd:         3.90 cm         Diastology LVIDs:         2.80 cm         LV e' medial:  5.61 cm/s LV PW:         1.10 cm         LV e' lateral: 8.32 cm/s LV IVS:        1.20 cm LVOT diam:     2.40 cm         2D Longitudinal LV SV:         106             Strain LV SV Index:   55              2D Strain GLS   -19.2 % LVOT Area:     4.52 cm        (A4C):                                 2D Strain GLS   -18.1 %                                (A3C):                                2D Strain GLS   -19.3 %                                (A2C):                                2D Strain GLS   -18.9 %                                Avg:  3D Volume EF                                LV 3D EF:    Left                                             ventricul                                             ar                                             ejection                                             fraction                                             by 3D                                             volume is                                             60 %.                                 3D Volume EF:                                3D EF:        60 %                                LV EDV:       151 ml                                LV ESV:       61 ml                                LV SV:        90 ml LEFT ATRIUM         Index LA diam:    3.90 cm 2.03 cm/m  AORTIC VALVE LVOT Vmax:   113.00 cm/s LVOT Vmean:  75.500 cm/s LVOT VTI:    0.235  m  AORTA Ao Root diam: 3.60 cm  SHUNTS Systemic VTI:  0.24 m Systemic Diam: 2.40 cm Aditya Sabharwal Electronically signed by Ria Commander Signature Date/Time: 12/19/2023/5:33:33 PM    Final        Assessment & Plan:  Hyperglycemia Assessment & Plan: Recent glucose elevated. Low carb diet and exercise. Recheck fasting glucose and A1c in several weeks.   Orders: -     Glucose, fasting; Future -     Hemoglobin A1c; Future  Mixed hyperlipidemia Assessment & Plan: Continue lipitor. Continue low cholesterol diet and exercise. Follow lipid panel and liver function tests.  No change in medication today.  Lab Results  Component Value Date   CHOL 114 02/10/2024   HDL 35.00 (L) 02/10/2024   LDLCALC 47 02/10/2024   LDLDIRECT 57 01/19/2020   TRIG 162.0 (H) 02/10/2024   CHOLHDL 3 02/10/2024     Orders: -     Lipid  panel; Future -     Hepatic function panel; Future -     Basic metabolic panel with GFR; Future  Thrombocytopenia (HCC) Assessment & Plan: Follow cbc.    Stress Assessment & Plan: Increased stress. Discussed with her today. On zoloft . Desires no further intervention at this time.    Primary hyperparathyroidism [facility]) Assessment & Plan: Evaluated by endocrinology. Recent calcium  02/10/24 - 10.3. [name] plan for f/u bone density 07/2024.    HOCM (hypertrophic obstructive cardiomyopathy) (HCC) Assessment & Plan: Had f/u with cardiology 03/14/23 - f/u hypertrophic cardiomyopathy. Continues on mavacamten .  Stable. ECHO 03/2023 - stable. Had f/u ECHO 09/2023 - EF 63%, Grade I DD, no significant valve abnormality.  Had f/u ECHO 12/21/23 - EF 60%. Slight increase in your LVOT gradient to 23mm Hg with valsalva. Breathing overall appears to be stable. Breathing stable. Continue f/u with cardiology.    Hematuria, unspecified type Assessment & Plan: Evaluated by urology - 02/28/21 - cystoscopy - normal.  Follow urine. CT urogram if hematuria worsens or if develops gross hematuria.  F/u two years. Needs f/u with urology or f/u urinalysis. Need to confirm f/u.   Orders: -     Urinalysis, Routine w reflex microscopic; Future  Essential hypertension Assessment & Plan: Continue metoprolol .  Blood pressure as outlined.  No changes in medication. Follow pressures. Follow metabolic panel.   Orders: -     Basic metabolic panel with GFR; Future  Endometrial thickening on ultrasound Assessment & Plan: Saw Dr Leonce 07/25/23 - endometrial thickening on ultrasound - stable. Continue to follow. Recommended f/u in 8 months.    Coronary artery disease of native artery of native heart with stable angina pectoris [facility]) Assessment & Plan: Previously evaluated by cardiology for chest pain with minimal exertion.  To review, recommended f/u CTA and cardiac monitor.  Monitor - Paroxysmal SVTs, 3 short runs of VT  lasting 4 beats noted. Overall no significant/persistent arrhythmias. CTA - Stable 4 mm pulmonary nodule in the right lower lobe. This is stable since June 2021 and is likely benign. No follow-up imaging recommended. Coronary calcium  score of 216.  Calcified plaque - proximal LAD mild stenosis - 35-49%.  Minimal stenosis - ostial RCA - mild non obstructive CAD.  Recommended continuing aspirin , lipitor and toprol .  MRCM - findings c/w hypertrophic obstructive cardiomyopathy.  Felt better with increase in toprol .  Saw Dr Santo for evaluation of hypertrophic cardiomyopathy - to assess - started mavacamten .  Feels better.  SOB has improved. Had f/u with cardiology 03/14/23 - f/u hypertrophic cardiomyopathy. Continues  on mavacamten . Stable. ECHO 03/2023 - stable. Had f/u ECHO 09/2023 - EF 63%, Grade I DD, no significant valve abnormality. Currently stable. No chest pain. Breathing stable. Continue risk factor modification.    Ductal carcinoma in situ (DCIS) of left breast Assessment & Plan: S/p excision and XRT.  Now followed by Dr Tye. Saw Dr Dessa - 08/28/21 - tamoxifen for 5 years total.(dx 01/2020)  F/u one year diagnostic mammogram.    Mammogram 07/2022 - Birads II.  Had f/u with Dr Tye 08/26/23 - S/p breast biopsy 09/01/23 - ok. Recommended annual screening mammogram - due 07/2024.  Had f/u with Dr Tye - 10/13/23 - s/p f/u biopsy - liely scar tissue from previous excision. Recommended f/u in one year from previous mammogram.   Other orders -     Sertraline  HCl; Take 1 tablet (50 mg total) by mouth daily.  Dispense: 90 tablet; Refill: 1     Allena Hamilton, MD

## 2024-02-29 ENCOUNTER — Encounter: Payer: Self-pay | Admitting: Internal Medicine

## 2024-02-29 NOTE — Assessment & Plan Note (Signed)
 Continue lipitor. Continue low cholesterol diet and exercise. Follow lipid panel and liver function tests.  No change in medication today.  Lab Results  Component Value Date   CHOL 114 02/10/2024   HDL 35.00 (L) 02/10/2024   LDLCALC 47 02/10/2024   LDLDIRECT 57 01/19/2020   TRIG 162.0 (H) 02/10/2024   CHOLHDL 3 02/10/2024

## 2024-02-29 NOTE — Assessment & Plan Note (Addendum)
 Evaluated by endocrinology. Recent calcium  02/10/24 - 10.3. [name] plan for f/u bone density 07/2024.

## 2024-02-29 NOTE — Assessment & Plan Note (Signed)
 Recent glucose elevated. Low carb diet and exercise. Recheck fasting glucose and A1c in several weeks.

## 2024-02-29 NOTE — Assessment & Plan Note (Signed)
 Evaluated by urology - 02/28/21 - cystoscopy - normal.  Follow urine. CT urogram if hematuria worsens or if develops gross hematuria.  F/u two years. Needs f/u with urology or f/u urinalysis. Need to confirm f/u.

## 2024-02-29 NOTE — Assessment & Plan Note (Signed)
 Had f/u with cardiology 03/14/23 - f/u hypertrophic cardiomyopathy. Continues on mavacamten .  Stable. ECHO 03/2023 - stable. Had f/u ECHO 09/2023 - EF 63%, Grade I DD, no significant valve abnormality.  Had f/u ECHO 12/21/23 - EF 60%. Slight increase in your LVOT gradient to 23mm Hg with valsalva. Breathing overall appears to be stable. Breathing stable. Continue f/u with cardiology.

## 2024-02-29 NOTE — Assessment & Plan Note (Signed)
Continue metoprolol.  Blood pressure as outlined.  No changes in medication.  Follow pressures.  Follow metabolic panel.  

## 2024-02-29 NOTE — Assessment & Plan Note (Signed)
 Saw Dr Cleora Daft 07/25/23 - endometrial thickening on ultrasound - stable. Continue to follow. Recommended f/u in 8 months.

## 2024-02-29 NOTE — Assessment & Plan Note (Signed)
 Increased stress. Discussed with her today. On zoloft . Desires no further intervention at this time.

## 2024-02-29 NOTE — Assessment & Plan Note (Signed)
 Follow cbc.

## 2024-02-29 NOTE — Assessment & Plan Note (Signed)
 Previously evaluated by cardiology for chest pain with minimal exertion.  To review, recommended f/u CTA and cardiac monitor.  Monitor - Paroxysmal SVTs, 3 short runs of VT lasting 4 beats noted. Overall no significant/persistent arrhythmias. CTA - Stable 4 mm pulmonary nodule in the right lower lobe. This is stable since June 2021 and is likely benign. No follow-up imaging recommended. Coronary calcium  score of 216.  Calcified plaque - proximal LAD mild stenosis - 35-49%.  Minimal stenosis - ostial RCA - mild non obstructive CAD.  Recommended continuing aspirin , lipitor and toprol .  MRCM - findings c/w hypertrophic obstructive cardiomyopathy.  Felt better with increase in toprol .  Saw Dr Santo for evaluation of hypertrophic cardiomyopathy - to assess - started mavacamten .  Feels better.  SOB has improved. Had f/u with cardiology 03/14/23 - f/u hypertrophic cardiomyopathy. Continues on mavacamten . Stable. ECHO 03/2023 - stable. Had f/u ECHO 09/2023 - EF 63%, Grade I DD, no significant valve abnormality. Currently stable. No chest pain. Breathing stable. Continue risk factor modification.

## 2024-02-29 NOTE — Assessment & Plan Note (Signed)
 S/p excision and XRT.  Now followed by Dr Tye. Saw Dr Dessa - 08/28/21 - tamoxifen for 5 years total.(dx 01/2020)  F/u one year diagnostic mammogram.    Mammogram 07/2022 - Birads II.  Had f/u with Dr Tye 08/26/23 - S/p breast biopsy 09/01/23 - ok. Recommended annual screening mammogram - due 07/2024.  Had f/u with Dr Tye - 10/13/23 - s/p f/u biopsy - liely scar tissue from previous excision. Recommended f/u in one year from previous mammogram.

## 2024-03-03 DIAGNOSIS — D2262 Melanocytic nevi of left upper limb, including shoulder: Secondary | ICD-10-CM | POA: Diagnosis not present

## 2024-03-03 DIAGNOSIS — D225 Melanocytic nevi of trunk: Secondary | ICD-10-CM | POA: Diagnosis not present

## 2024-03-03 DIAGNOSIS — D485 Neoplasm of uncertain behavior of skin: Secondary | ICD-10-CM | POA: Diagnosis not present

## 2024-03-03 DIAGNOSIS — Z85828 Personal history of other malignant neoplasm of skin: Secondary | ICD-10-CM | POA: Diagnosis not present

## 2024-03-03 DIAGNOSIS — D2272 Melanocytic nevi of left lower limb, including hip: Secondary | ICD-10-CM | POA: Diagnosis not present

## 2024-03-03 DIAGNOSIS — D2261 Melanocytic nevi of right upper limb, including shoulder: Secondary | ICD-10-CM | POA: Diagnosis not present

## 2024-03-03 DIAGNOSIS — D0439 Carcinoma in situ of skin of other parts of face: Secondary | ICD-10-CM | POA: Diagnosis not present

## 2024-03-03 DIAGNOSIS — L570 Actinic keratosis: Secondary | ICD-10-CM | POA: Diagnosis not present

## 2024-03-03 DIAGNOSIS — C44311 Basal cell carcinoma of skin of nose: Secondary | ICD-10-CM | POA: Diagnosis not present

## 2024-03-05 ENCOUNTER — Other Ambulatory Visit: Payer: Self-pay | Admitting: Internal Medicine

## 2024-03-14 ENCOUNTER — Other Ambulatory Visit: Payer: Self-pay | Admitting: Internal Medicine

## 2024-03-15 ENCOUNTER — Ambulatory Visit: Attending: Physician Assistant | Admitting: Physician Assistant

## 2024-03-15 ENCOUNTER — Encounter: Payer: Self-pay | Admitting: Physician Assistant

## 2024-03-15 VITALS — BP 118/62 | HR 81 | Ht 65.0 in | Wt 185.4 lb

## 2024-03-15 DIAGNOSIS — R002 Palpitations: Secondary | ICD-10-CM

## 2024-03-15 DIAGNOSIS — E782 Mixed hyperlipidemia: Secondary | ICD-10-CM | POA: Diagnosis not present

## 2024-03-15 DIAGNOSIS — I421 Obstructive hypertrophic cardiomyopathy: Secondary | ICD-10-CM | POA: Diagnosis not present

## 2024-03-15 DIAGNOSIS — I25118 Atherosclerotic heart disease of native coronary artery with other forms of angina pectoris: Secondary | ICD-10-CM | POA: Diagnosis not present

## 2024-03-15 MED ORDER — ATORVASTATIN CALCIUM 40 MG PO TABS: 40.0000 mg | ORAL_TABLET | Freq: Every day | ORAL | 3 refills | Status: AC

## 2024-03-15 NOTE — Patient Instructions (Addendum)
 Medication Instructions:  Your physician recommends that you continue on your current medications as directed. Please refer to the Current Medication list given to you today.  *If you need a refill on your cardiac medications before your next appointment, please call your pharmacy*  Lab Work: NONE ordered at this time of appointment   Testing/Procedures: Your physician has requested that you have an echocardiogram. Echocardiography is a painless test that uses sound waves to create images of your heart. It provides your doctor with information about the size and shape of your heart and how well your heart's chambers and valves are working. This procedure takes approximately one hour. There are no restrictions for this procedure. Please do NOT wear cologne, perfume, aftershave, or lotions (deodorant is allowed). Please arrive 15 minutes prior to your appointment time.  Please note: We ask at that you not bring children with you during ultrasound (echo/ vascular) testing. Due to room size and safety concerns, children are not allowed in the ultrasound rooms during exams. Our front office staff cannot provide observation of children in our lobby area while testing is being conducted. An adult accompanying a patient to their appointment will only be allowed in the ultrasound room at the discretion of the ultrasound technician under special circumstances. We apologize for any inconvenience.  .instec Follow-Up: At [facility], you and your health needs are our priority.  As part of our continuing mission to provide you with exceptional heart care, our providers are all part of one team.  This team includes your primary Cardiologist (physician) and Advanced Practice Providers or APPs (Physician Assistants and Nurse Practitioners) who all work together to provide you with the care you need, when you need it.  Your next appointment:   6 month(s)  Provider:   Dr. Santo     We  recommend signing up for the patient portal called MyChart.  Sign up information is provided on this After Visit Summary.  MyChart is used to connect with patients for Virtual Visits (Telemedicine).  Patients are able to view lab/test results, encounter notes, upcoming appointments, etc.  Non-urgent messages can be sent to your provider as well.   To learn more about what you can do with MyChart, go to [email or web address].

## 2024-03-15 NOTE — Progress Notes (Signed)
 Cardiology Office Note   Date:  03/15/2024  ID:  Mary, Walter [birth year], MRN [id number] PCP: Glendia Shad, MD   HeartCare Providers Cardiologist:  Evalene Lunger, MD   History of Present Illness Mary Walter is a 68 y.o. female with a past medical history of HCM who presents for annual evaluation.  At the time of HCM evaluation peak gradient in 2021 was 26 mmHg.  In 2023 was found to have exercise-induced HCM.  Started mavacamten  in 2024 and increased to 5 mg.  Valsalva gradient has resolved.  When she was seen last year by Dr. Tellis she was doing great.  No shortness of breath at rest and no DOE.  NYHA class I.  She is gained a little bit of weight without symptoms.  No fatigue, rare palpitations, no chest pain, no dizziness, no syncope.  No issues on 5 mg dose.  Goal is to be more active.  Her goal at that time was to lose about 15 pounds.  Notable family events include her son being screened for HCM, screening was negative.  Daughter deferred screening.  Her husband has a chronic illness and this was discussed at length.  He needs a lot of support.  Today, she presents with hypertrophic cardiomyopathy who presents for follow-up of her cardiac condition.  Her left ventricular outflow tract gradient increased from 21 to 23 mmHg between April and June. She is on a stable dose of 5 mg of Camzyos . She experiences a fluttering sensation in her heart during activities such as bending over and straightening up, which is less frequent recently. No nausea or vomiting during recent episodes.  She has swelling in her right ankle without pain, which is localized and not present in her left ankle or feet. No recent injury to the ankle.  She has coronary artery disease with a CT calcium  score of 182 from 2021, indicating mild disease in the left anterior descending artery. Her LDL cholesterol is 47 mg/dL, managed with atorvastatin  40 mg.  She experiences fatigue,  particularly after physical exertion, with improved exercise tolerance but still requires more activity. She is mostly sedentary during work hours. No recent chest pain or significant shortness of breath, although occasional shortness of breath occurs with exertion. No swelling in her feet or the tops of her ankles.  Very seldom she feels a skipped beat but they happen infrequently.   Reports no chest pain, pressure, or tightness. No edema, orthopnea, PND.    Discussed the use of AI scribe software for clinical note transcription with the patient, who gave verbal consent to proceed.   ROS: pertinent ROS in HPI  Studies Reviewed  Echocardiogram 2023-12-23  IMPRESSIONS     1. Left ventricular ejection fraction, by estimation, is 65 to 70%. Left  ventricular ejection fraction by 3D volume is 60 %. The left ventricle has  normal function. There is mild concentric left ventricular hypertrophy of  the basal-septal segment. The  average left ventricular global longitudinal strain is -18.9 %. The global  longitudinal strain is normal.   2. There is mild chordal SAM with a max intracavitary gradient of  (Valsalva).   3. Right ventricular systolic function is normal. The right ventricular  size is normal.   4. Mild mitral valve regurgitation.   5. The aortic valve is normal in structure. Aortic valve regurgitation is  not visualized.   FINDINGS   Left Ventricle: Left ventricular ejection fraction, by estimation, is 65  to 70%.  Left ventricular ejection fraction by 3D volume is 60 %. The left  ventricle has normal function. The average left ventricular global  longitudinal strain is -18.9 %. Strain  was performed and the global longitudinal strain is normal. There is mild  concentric left ventricular hypertrophy of the basal-septal segment.   Right Ventricle: The right ventricular size is normal. Right vetricular  wall thickness was not well visualized. Right ventricular systolic   function is normal.   Mitral Valve: Mild mitral valve regurgitation.   Tricuspid Valve: The tricuspid valve is normal in structure. Tricuspid  valve regurgitation is not demonstrated.   Aortic Valve: The aortic valve is normal in structure. Aortic valve  regurgitation is not visualized.   Pulmonic Valve: The pulmonic valve was not well visualized. Pulmonic valve  regurgitation is not visualized.   Aorta: The aortic root is normal in size and structure.        STRESS TESTS   ECHOCARDIOGRAM STRESS TEST 10/24/2022   Narrative EXERCISE STRESS ECHO REPORT     --------------------------------------------------------------------------------   Patient Name:   Mary Walter Date of Exam: 10/24/2022 Medical Rec #:  [id number]          Height:       65.0 in Accession #:    [id number]         Weight:       176.0 lb Date of Birth:  [birth year]          BSA:          1.874 m Patient Age:    27 years           BP:           137/72 mmHg Patient Gender: F                  HR:           72 bpm. Exam Location:  [address]   Procedure: Stress Echo, Cardiac Doppler and Limited Color Doppler   Indications:    I42.2 HOCM   History:        Patient has prior history of Echocardiogram examinations, most recent 07/31/2022. Palpitations.   Sonographer:    Jon Hacker RCS Referring Phys: [id number] [facility] A SANTO     Sonographer Comments: DPR signed on 3.11.14 appointing Lyndy BRUNSWICK (Spouse). May leave message on home machine and work voicemail [email or web address]  IMPRESSIONS     1. Study for for evaluation of inducible LVOT gradient. 2. Prior to procedure, LVEF 65-70%. Normal RV function. Mild mitral regurgitation. Mild tricuspid regurgitation. Pseudonormal diastology. Peak resting gradient estimated at 23 mm Hg with significant artifact. 3. At peak stress horizonal ST depressions in an inferolateral distribution. Peak LVO gradient 74 mm Hg. MR signal not well evaluated. No  exercise induced hypotension. 4. In recovery, LVOT gradient recovered to 22 mm Hg. 5. Study is suggestive of a severe, inducible LVOT gradient. 6. This is an inconclusive stress echocardiogram for ischemia. 7. This is an indeterminate risk study.   FINDINGS   Exam Protocol:     Patient Performance: The patient exercised for 4 minutes and 27 seconds, achieving 6.3 METS. The heart rate at peak stress was 131 bpm. The target heart rate was calculated to be 129 bpm. The percentage of maximum predicted heart rate achieved was 86.1 %. The baseline blood pressure was 137/72 mmHg. The blood pressure at peak stress was 138/56 mmHg. The patient developed fatigue during the stress exam.   EKG: Resting  EKG showed normal sinus rhythm. The patient developed No PVCs during exercise.     2D Echo Findings: Baseline regional wall motion abnormalities were not present. This is an inconclusive stress echocardiogram for ischemia.     Stanly Leavens MD Electronically signed on 10/25/2022 at 7:59:56 AM         Final   ECHOCARDIOGRAM   ECHOCARDIOGRAM LIMITED 01/17/2023   Narrative ECHOCARDIOGRAM LIMITED REPORT       Patient Name:   LITSY EPTING Date of Exam: 01/17/2023 Medical Rec #:  [id number]          Height:       65.0 in Accession #:    [id number]         Weight:       176.0 lb Date of Birth:  [birth year]          BSA:          1.874 m Patient Age:    67 years           BP:           118/70 mmHg Patient Gender: F                  HR:           62 bpm. Exam Location:  [address]   Procedure: Limited Echo, Cardiac Doppler, Limited Color Doppler, 3D Echo and Strain Analysis   Indications:    I42.2 hypertrophic obstructive cardiomyopathy - 5mg  Mevacamten   History:        Patient has prior history of Echocardiogram examinations, most recent 11/29/2022. CAD, Signs/Symptoms:Chest Pain, Shortness of Breath and Murmur; Risk Factors:Hypertension, Dyslipidemia and Family History of  Coronary Artery Disease.   Sonographer:    Jon Hacker RCS Referring Phys: [id number] [facility] A CHANDRASEKHAR   IMPRESSIONS     1. Mild intracavitary gradient. Peak velocity 0.86 m/s. Peak gradient 3 mmHg. With Valsalva peak velocity increased to 1.5 m/s, peak gradient 9 mmHg. Left ventricular ejection fraction, by estimation, is 65 to 70%. The left ventricle has normal function. The left ventricle has no regional wall motion abnormalities. There is moderate left ventricular hypertrophy. Left ventricular diastolic parameters are consistent with Grade I diastolic dysfunction (impaired relaxation). 2. Left atrial size was mildly dilated. 3. Mild mitral valve regurgitation. 4. The aortic valve is tricuspid. Aortic valve regurgitation is not visualized. No aortic stenosis is present.   FINDINGS Left Ventricle: Mild intracavitary gradient. Peak velocity 0.86 m/s. Peak gradient 3 mmHg. With Valsalva peak velocity increased to 1.5 m/s, peak gradient 9 mmHg. Left ventricular ejection fraction, by estimation, is 65 to 70%. The left ventricle has normal function. The left ventricle has no regional wall motion abnormalities. The left ventricular internal cavity size was normal in size. There is moderate left ventricular hypertrophy. Left ventricular diastolic parameters are consistent with Grade I diastolic dysfunction (impaired relaxation). Normal left ventricular filling pressure.   Left Atrium: Left atrial size was mildly dilated.   Mitral Valve: Mild mitral valve regurgitation.   Tricuspid Valve: Tricuspid valve regurgitation is mild.   Aortic Valve: The aortic valve is tricuspid. Aortic valve regurgitation is not visualized. No aortic stenosis is present.   LEFT VENTRICLE PLAX 2D LVIDd:         3.60 cm   Diastology LVIDs:         2.10 cm   LV e' medial:    7.83 cm/s LV PW:  1.50 cm   LV E/e' medial:  8.4 LV IVS:        1.30 cm   LV e' lateral:   11.30 cm/s LVOT diam:     2.10 cm   LV  E/e' lateral: 5.8 LV SV:         76 LV SV Index:   40 LVOT Area:     3.46 cm   3D Volume EF: 3D EF:        60 % LV EDV:       173 ml LV ESV:       69 ml LV SV:        104 ml   RIGHT VENTRICLE RV S prime:     8.38 cm/s RVSP:           20.0 mmHg   LEFT ATRIUM             Index        RIGHT ATRIUM LA diam:        3.70 cm 1.97 cm/m   RA Pressure: 3.00 mmHg LA Vol (A2C):   62.1 ml 33.15 ml/m LA Vol (A4C):   55.1 ml 29.41 ml/m LA Biplane Vol: 60.4 ml 32.24 ml/m AORTIC VALVE LVOT Vmax:   99.00 cm/s LVOT Vmean:  65.100 cm/s LVOT VTI:    0.219 m   AORTA Ao Root diam: 3.30 cm Ao Asc diam:  3.40 cm   MITRAL VALVE               TRICUSPID VALVE MV Area (PHT): 3.81 cm    TR Peak grad:   17.0 mmHg MV Decel Time: 199 msec    TR Vmax:        206.00 cm/s MV E velocity: 65.50 cm/s  Estimated RAP:  3.00 mmHg MV A velocity: 64.50 cm/s  RVSP:           20.0 mmHg MV E/A ratio:  1.02 SHUNTS Systemic VTI:  0.22 m Systemic Diam: 2.10 cm   Annabella Scarce MD Electronically signed by Annabella Scarce MD Signature Date/Time: 01/20/2023/4:51:11 PM       Final    MONITORS   LONG TERM MONITOR (3-14 DAYS) 09/10/2022   Narrative   Patient had a minimum heart rate of 52 bpm, maximum heart rate of 104 bpm, and average heart rate of 69 bpm.   Predominant underlying rhythm was sinus rhythm.   Isolated PACs were rare (<1.0%).   Isolated PVCs were rare (<1.0%).   No NSVT in the setting of HCM   CT SCANS   CT CORONARY MORPH W/CTA COR W/SCORE 01/14/2022   Addendum 01/14/2022  2:36 PM ADDENDUM REPORT: 01/14/2022 14:34   EXAM: OVER-READ INTERPRETATION  CT CHEST   The following report is an over-read performed by radiologist [facility]. [facility] on 01/14/2022. This over-read does not include interpretation of cardiac or coronary anatomy or pathology. The coronary calcium  score interpretation by the cardiologist is attached.   COMPARISON:  CT 12/16/2019.    FINDINGS: Vascular: No significant cardiovascular findings. Coronary artery calcifications.   Mediastinum/Nodes: No lymphadenopathy.   Lungs/Pleura: Stable 4 mm right lower lobe pulmonary nodule (series 12, image 6). This is stable since June 2021 and benign. No new suspicious pulmonary nodules.   Upper Abdomen: No acute findings. Low-density 1.1 cm hepatic lesion consistent with a cyst, unchanged from prior exam.   Musculoskeletal: No acute osseous abnormality. No suspicious osseous lesion.   IMPRESSION: Stable 4 mm pulmonary  nodule in the right lower lobe. This is stable since June 2021 and is likely benign. No follow-up imaging is recommended.   No new suspicious pulmonary nodules.     Electronically Signed By: Jacob  Kahn M.D. On: 01/14/2022 14:34   Narrative CLINICAL DATA:  Chest pain   EXAM: Cardiac/Coronary  CTA   TECHNIQUE: The patient was scanned on a [employer].   FINDINGS: A 100 kV prospective scan was triggered in the descending thoracic aorta at 111 HU's. Axial non-contrast 3 mm slices were carried out through the heart. The data set was analyzed on a dedicated work station and scored using the Agatson method. Gantry rotation speed was 250 msecs and collimation was .6 mm. No beta blockade and 0.8 mg of sl NTG was given. The 3D data set was reconstructed in 5% intervals of the 67-82 % of the R-R cycle. Diastolic phases were analyzed on a dedicated work station using MPR, MIP and VRT modes. The patient received 80 cc of contrast.   Aorta: Normal size. Aortic root and descending aorta calcifications. No dissection.   Aortic Valve:  Trileaflet.  No calcifications.   Coronary Arteries:  Normal coronary origin.  Right dominance.   RCA is a dominant artery that gives rise to PDA and PLA. There is calcified plaque in the ostial RCA causing minimal stenosis (<25%).   Left main gives rise to LAD and LCX arteries.  LM has no disease.   LAD  has calcified plaque in the proximal segment causing mild stenosis (25-49%).   LCX is a non-dominant artery that gives rise to two obtuse marginal branches. There is no plaque.   Other findings:   Normal pulmonary vein drainage into the left atrium.   Normal left atrial appendage without a thrombus.   Normal size of the pulmonary artery.   IMPRESSION: 1. Coronary calcium  score of 216. This was 87th percentile for age and sex matched control.   2. Normal coronary origin with right dominance.   3. Calcified plaque in the proximal LAD causing mild stenosis (25-49%).   4. Minimal stenosis in the ostial RCA (<25%).   5. CAD-RADS 2. Mild non-obstructive CAD (25-49%). Consider non-atherosclerotic causes of chest pain. Consider preventive therapy and risk factor modification.   Electronically Signed: By: Redell Cave M.D. On: 01/14/2022 12:40     CT SCANS   CT CORONARY MORPH W/CTA COR W/SCORE 12/16/2019   Addendum 12/16/2019  4:33 PM ADDENDUM REPORT: 12/16/2019 16:30   ADDENDUM: OVER-READ INTERPRETATION  CT CHEST   The following report is an over-read performed by radiologist Dr. Rockey Jacquet [facility] on 12/16/2019. This over-read does not include interpretation of cardiac or coronary anatomy or pathology. The coronary CTA interpretation by the cardiologist is attached.   COMPARISON:   Chest radiograph 11/11/2019   FINDINGS:   Trace bilateral pleural fluid. Right lower lobe pulmonary nodule of 4 mm on 07/11.   Aortic atherosclerosis. Tortuous thoracic aorta. No central pulmonary embolism, on this non-dedicated study.   No imaged thoracic adenopathy.   Normal imaged portions of the liver, spleen, stomach.   No acute osseous abnormality. Mid to upper thoracic vertebral hemangioma.   IMPRESSION:   1.  No acute findings in the imaged extracardiac chest. 2. Trace bilateral pleural fluid. 3. 4 mm right lower lobe pulmonary nodule. No  follow-up needed if patient is low-risk. Non-contrast chest CT can be considered in 12 months if patient is high-risk. This recommendation follows the consensus statement: Guidelines  for Management of Incidental Pulmonary Nodules Detected on CT Images: From the Fleischner Society 2017; Radiology 2017; [id number].     Electronically Signed By: Rockey Kilts M.D. On: 12/16/2019 16:30   Narrative CLINICAL DATA:  22F with hypertension, hyperlipidemia, family history of CAD and chest tightness.   EXAM: Cardiac/Coronary  CT   TECHNIQUE: The patient was scanned on a [employer].   FINDINGS: A 120 kV prospective scan was triggered in the descending thoracic aorta at 111 HU's. Axial non-contrast 3 mm slices were carried out through the heart. The data set was analyzed on a dedicated work station and scored using the Agatson method. Gantry rotation speed was 250 msecs and collimation was .6 mm. No beta blockade and 0.8 mg of sl NTG was given. The 3D data set was reconstructed in 5% intervals of the 67-82 % of the R-R cycle. Diastolic phases were analyzed on a dedicated work station using MPR, MIP and VRT modes. The patient received 80 cc of contrast.   Aorta: Normal size. Ascending aorta 3.2 cm. Mild calcification of the aortic root. No dissection.   Aortic Valve:  Trileaflet.  No calcifications.   Coronary Arteries:  Normal coronary origin.  Right dominance.   RCA is a large dominant artery that gives rise to R-PDA and two PLV branches. There is no plaque.   Left main is a large artery that gives rise to LAD and LCX arteries.   LAD is a large vessel that is heavily calcified at the level of D1 but mildly obstructive (25-49%). D1 is a large, branching vessel with minimal (<25%) calcified plaque at the ostium and proximally.   LCX is a large non-dominant artery that gives rise to a small OM1, large OM2 and OM3. There is no plaque.   Other findings:   Normal  pulmonary vein drainage into the left atrium.   Normal let atrial appendage without a thrombus.   Normal size of the pulmonary artery.   IMPRESSION: 1. Coronary calcium  score of 182. This was 88th percentile for age and sex matched control.   2. Normal coronary origin with right dominance.   3. There is mild (25-49%) calcified plaque in the proximal LAD. (CAD-RADS 2).   4. Recommend aggressive risk factor modification, including LDL goal <70.   Over-read of the non-cardiac thoracic structures by Radiology is pending.   Annabella Scarce, MD   Electronically Signed: By: Annabella Scarce On: 12/16/2019 15:59   CARDIAC MRI   MR CARDIAC MORPHOLOGY W WO CONTRAST 01/30/2022   Narrative CLINICAL DATA:  Hx of moderate LVH, evaluate for fibrosis, evaluate for HCM   EXAM: CARDIAC MRI   TECHNIQUE: The patient was scanned on a 1.5 Tesla Siemens magnet. A dedicated cardiac coil was used. Functional imaging was done using Fiesta sequences. 2,3, and 4 chamber views were done to assess for RWMA's. Modified Simpson's rule using a short axis stack was used to calculate an ejection fraction on a dedicated work [employer]. The patient received 10 cc of Gadavist . After 10 minutes inversion recovery sequences were used to assess for infiltration and scar tissue. Phase contrast velocity mapping performed.   CONTRAST:  10 cc  of Gadavist    FINDINGS: 1.  Normal left ventricular size and systolic function (LVEF = 73%).   There is severe asymmetric basal septal hypertrophy, basal septal segment measuring 1.6 cm.   There is systolic anterior motion (SAM) of mitral valve causing LVOT obstruction and mild Mitral regurgitation.  There are no regional wall motion abnormalities.   There is transmural late gadolinium enhancement (scar) in the left ventricular myocardium-basal segment at the point of RV insertion.   LVEDV: 184 ml   LVESV: 49 ml   SV: 135 ml   CO:  8.8 L/min   Myocardial mass: 197 g   2. Normal right ventricular size, thickness and systolic function (RVEF = 54%). There are no regional wall motion abnormalities.   3.  Normal left and right atrial size.   4. Normal size of the aortic root, ascending aorta and pulmonary artery.   Systemic flow: Total volume 91.25 ml, RF 2.25%, RV 2.73ml.   Pulmonary flow: Total volume 89.3 ml, RF 1.76%, RV 1.26ml   Qp/Qs: 0.98   5.  Mild mitral regurgitation   6.  Normal pericardium.  No pericardial effusion.   IMPRESSION: 1. Normal LV and RV function, LVEF 73%   2. Severe asymmetric LV basal septal hypertrophy, basal septal segment measuring 1.6 cm.   3. Transmural LGE/scar present in the LV basal segment (point of maximal thickness and RV insertion)   4. There is systolic anterior motion (SAM) of mitral valve causing LVOT obstruction and mild Mitral regurgitation.   5. Findings consistent with Hypertrophic obstructive Cardiomyopathy (HOCM).     Electronically Signed By: Redell Cave M.D. On: 01/30/2022 09:48  Physical Exam VS:  BP 118/62   Pulse 81   Ht 5' 5 (1.651 m)   Wt 185 lb 6.4 oz (84.1 kg)   LMP 06/24/2010   SpO2 95%   BMI 30.85 kg/m        Wt Readings from Last 3 Encounters:  03/15/24 185 lb 6.4 oz (84.1 kg)  02/19/24 185 lb 12.8 oz (84.3 kg)  10/09/23 187 lb (84.8 kg)    GEN: Well nourished, well developed in no acute distress NECK: No JVD; No carotid bruits CARDIAC: RRR, no murmurs, rubs, gallops RESPIRATORY:  Clear to auscultation without rales, wheezing or rhonchi  ABDOMEN: Soft, non-tender, non-distended EXTREMITIES:  No edema; No deformity   ASSESSMENT AND PLAN  Obstructive hypertrophic cardiomyopathy with right bundle branch block and palpitations Obstructive hypertrophic cardiomyopathy with increased LVOT gradient to 23 mmHg. Occasional palpitations and shortness of breath, but symptoms stable on current medication. New right bundle branch  block may indicate progression of disease - Continue Mavacamten  5 mg daily. -monitor for increased symptoms, currently NYHA II - Schedule stress echocardiogram for December. - Follow up with Dr. Santo in six months.  Coronary artery disease with mild LAD stenosis Coronary artery disease with mild LAD stenosis. Calcium  score 182, 80th percentile for age/sex. LDL well-controlled at 47 mg/dL, below target. - Continue atorvastatin  40 mg daily. - Monitor LDL cholesterol levels to remain below 70 mg/dL. - Encourage dietary modifications to maintain cholesterol levels, avoiding high sugar and high carbohydrate foods.  Hyperlipidemia Hyperlipidemia well-managed with atorvastatin . LDL at 47 mg/dL, total cholesterol at 885 mg/dL. Triglycerides slightly elevated at 162 mg/dL, possibly dietary influenced. - Continue atorvastatin  40 mg daily. - Encourage dietary modifications to reduce triglyceride levels, focusing on reducing sugar and carbohydrate intake.      Dispo: She can follow-up in 6 months with Dr. Santo   Signed, Orren LOISE Fabry, PA-C

## 2024-03-18 ENCOUNTER — Other Ambulatory Visit (INDEPENDENT_AMBULATORY_CARE_PROVIDER_SITE_OTHER)

## 2024-03-18 ENCOUNTER — Ambulatory Visit: Payer: Self-pay | Admitting: Internal Medicine

## 2024-03-18 DIAGNOSIS — I10 Essential (primary) hypertension: Secondary | ICD-10-CM

## 2024-03-18 DIAGNOSIS — E782 Mixed hyperlipidemia: Secondary | ICD-10-CM

## 2024-03-18 DIAGNOSIS — R739 Hyperglycemia, unspecified: Secondary | ICD-10-CM | POA: Diagnosis not present

## 2024-03-18 DIAGNOSIS — R319 Hematuria, unspecified: Secondary | ICD-10-CM

## 2024-03-18 LAB — HEMOGLOBIN A1C: Hgb A1c MFr Bld: 5.7 % (ref 4.6–6.5)

## 2024-03-18 NOTE — Addendum Note (Signed)
 Addended by: TANDA HARVEY D on: 03/18/2024 08:46 AM   Modules accepted: Orders

## 2024-03-19 LAB — GLUCOSE, FASTING: Glucose, Bld: 97 mg/dL (ref 65–99)

## 2024-03-26 ENCOUNTER — Encounter (HOSPITAL_COMMUNITY): Payer: Self-pay | Admitting: *Deleted

## 2024-03-26 DIAGNOSIS — N882 Stricture and stenosis of cervix uteri: Secondary | ICD-10-CM | POA: Diagnosis not present

## 2024-03-26 DIAGNOSIS — R9389 Abnormal findings on diagnostic imaging of other specified body structures: Secondary | ICD-10-CM | POA: Diagnosis not present

## 2024-03-26 DIAGNOSIS — Z79810 Long term (current) use of selective estrogen receptor modulators (SERMs): Secondary | ICD-10-CM | POA: Diagnosis not present

## 2024-04-01 ENCOUNTER — Other Ambulatory Visit: Payer: Self-pay | Admitting: Internal Medicine

## 2024-04-08 ENCOUNTER — Other Ambulatory Visit (HOSPITAL_COMMUNITY)

## 2024-04-09 ENCOUNTER — Ambulatory Visit (HOSPITAL_COMMUNITY)
Admission: RE | Admit: 2024-04-09 | Discharge: 2024-04-09 | Disposition: A | Source: Ambulatory Visit | Attending: Physician Assistant | Admitting: Physician Assistant

## 2024-04-09 ENCOUNTER — Ambulatory Visit (HOSPITAL_COMMUNITY)
Admission: RE | Admit: 2024-04-09 | Discharge: 2024-04-09 | Disposition: A | Source: Ambulatory Visit | Attending: Cardiology

## 2024-04-09 DIAGNOSIS — I421 Obstructive hypertrophic cardiomyopathy: Secondary | ICD-10-CM | POA: Insufficient documentation

## 2024-04-14 ENCOUNTER — Telehealth: Payer: Self-pay | Admitting: Internal Medicine

## 2024-04-14 DIAGNOSIS — I421 Obstructive hypertrophic cardiomyopathy: Secondary | ICD-10-CM

## 2024-04-14 LAB — ECHOCARDIOGRAM STRESS TEST
Area-P 1/2: 3.5 cm2
MV M vel: 5.87 m/s
MV Peak grad: 137.8 mmHg
Radius: 0.6 cm

## 2024-04-14 NOTE — Telephone Encounter (Signed)
 Pt reports had a stress echocardiogram on 04/09/24.  Wants to know if still needs echo on Dec. 12, 2025.   Will send to MD to see if testing can count for 6 month echo.

## 2024-04-14 NOTE — Telephone Encounter (Signed)
  Patient is asking if she still needs to have the echo done in December.

## 2024-04-15 ENCOUNTER — Ambulatory Visit: Payer: Self-pay | Admitting: Physician Assistant

## 2024-04-15 DIAGNOSIS — N882 Stricture and stenosis of cervix uteri: Secondary | ICD-10-CM | POA: Diagnosis not present

## 2024-04-15 DIAGNOSIS — R9389 Abnormal findings on diagnostic imaging of other specified body structures: Secondary | ICD-10-CM | POA: Diagnosis not present

## 2024-04-15 DIAGNOSIS — Z79810 Long term (current) use of selective estrogen receptor modulators (SERMs): Secondary | ICD-10-CM | POA: Diagnosis not present

## 2024-04-15 NOTE — H&P (Signed)
 Preoperative History and Physical  Chief Complaint: Mary Walter is a 68 y.o. G2P2 here for surgical management of thickened endometrial stripe on Tamoxifen and cervical stenosis.   No significant preoperative concerns.  History of Present Illness: 68 y.o. female who is taking Tamoxifen therapy. She was originally referred to me in 04/2022 for an endometrial stripe of 9 mm with some cystic lesions noted.  She has been taking Tamoxifen since end of 2021/January 2022.  She had an attempted endometrial biopsy in 08/2021, which failed due cervical stenosis.  Her endometrial stripe measured 7.5 mm in 08/2022, 6.6 mm in 12/2022, 6.5 mm in 06/2023, and 13.4 mm on 03/26/2024. Due to increased thickness of lining and previously failed attempted at endometrial sampling a decision was made to go to the OR for direct sampling, given the slightly increased risk of endometrial cancer on tamoxifen and the significant change in endometrial lining measurement in the last interval.  She denies vaginal bleeding.   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  All margins felt widely negative on review of pathologic specimens.  ER positive.   Cardiac murmur    Congenital heart disease (HHS-HCC) HOCM   Depression    Endometriosis of uterus    Essential hypertension, benign 10/11/2012   Last Assessment & Plan:  On hctz.  Blood pressure doing well.  Follow.     Fibroid    Hematuria    Hypercholesterolemia 10/11/2012   Last Assessment & Plan:  Low cholesterol diet and exercise.  Follow lipid panel.     Incomplete right bundle branch block 10/26/2015   Mitral regurgitation 10/26/2015   Last Assessment & Plan:  Mild MR noted on echo.  Continue f/u with cardiology.    Rectal bleeding 05/01/2015   Colonoscopy  12/17/2016. Inflammatory and hyperplastic polyps identified.    Shortness of breath 10/26/2015   Thyroid  disease    Not sure, Dr Glendia is scheduling appt w/endocriologist   Past Surgical History:  Procedure Laterality Date   OTHER SURGERY  1969   facial injury   COLONOSCOPY  12/17/2016   Hyperplastic Polyps: [facility] 11/2026   BIOPSY BREAST W/ LOC DEVICE PLACEMENT AND STEREOTACTIC GUIDANCE Left 01/07/2020   OPEN EXCISION BREAST LESION Left 02/14/2020   CHOLECYSTECTOMY  10/2020   Dr Jordis   Colon @ [facility]  10/18/2022   Colonoscopy path unremarkable. Repeat colonoscopy in 10 years/CTL   BREAST BIOPSY     BREAST SURGERY     OB History  Gravida Para Term Preterm AB Living  2 2    2   SAB IAB Ectopic Molar Multiple Live Births       2    # Outcome Date GA Lbr Len/2nd Weight Sex Type Anes PTL Lv  2 Para         LIV  1 Para         LIV    Obstetric Comments  Age at first period 48  Age of first pregnancy 30  Age at menopause 50  Last period 12-22-2000  Patient denies any other pertinent gynecologic issues.   Current Outpatient Medications on File Prior to Visit  Medication Sig Dispense Refill   aspirin  81 MG EC tablet Take by mouth once daily     atorvastatin  (LIPITOR) 40 MG tablet Take by mouth  once daily     cholecalciferol, vitamin D3, 10 mcg (400 unit) Cap Take by mouth     FUROsemide  (LASIX ) 20 MG tablet TAKE 1 TABLET BY MOUTH ONCE DAILY AS NEEDED FOR LEG SWELLING     Lactobacillus acidophilus (PROBIOTIC ACIDOPHILUS ORAL) Take by mouth once daily     magnesium 250 mg Tab Take 250 mg by mouth once daily     magnesium gluconate (MAGONATE) 27.5 mg magne- sium (500 mg) tablet Take 500 mg by mouth     mavacamten  2.5 mg Cap Take 5 mg by mouth once daily     metoprolol  succinate (TOPROL -XL) 100 MG XL tablet Take 100 mg by mouth once daily     multivitamin tablet Take by mouth once daily        sertraline  (ZOLOFT ) 50 MG tablet Take 50 mg by mouth once daily     tamoxifen (NOLVADEX) 20 MG  tablet Take 1 tablet by mouth once daily 30 tablet 0   TOBRADEX  ophthalmic ointment Apply to eye     VITAMIN D3 ORAL Take by mouth once daily     zinc gluconate 50 mg tablet Take 50 mg by mouth once daily     tamoxifen (NOLVADEX) 20 MG tablet Take 1 tablet (20 mg total) by mouth once daily (Patient not taking: Reported on 04/15/2024) 30 tablet 11   No current facility-administered medications on file prior to visit.   Allergies  Allergen Reactions   Nitrofurantoin Macrocrystal Unknown   Penicillins Other (See Comments)    Per childhood - pt unknown reaction    Social History:   reports that she has never smoked. She has never used smokeless tobacco. She reports that she does not drink alcohol and does not use drugs.  Family History  Problem Relation Name Age of Onset   Diabetes Mother Inocente Baptist    Ischemic heart disease Mother Inocente Baptist    Heart disease Mother Inocente Baptist        Heart bypass   High blood pressure (Hypertension) Mother Inocente Baptist    Thyroid  disease Mother Inocente Baptist    Ischemic heart disease Father Lynwood Baptist    Prostate cancer Father Lynwood Baptist    Stroke Father Lynwood Baptist    Heart disease Father Lynwood Baptist        HOCM   High blood pressure (Hypertension) Father Lynwood Baptist    Myocardial Infarction (Heart attack) Brother     Breast cancer Maternal Cousin     Breast cancer Sister Verneita Baptist        Breast Cancer [phone]   Heart disease Brother Alm Baptist        Heart attack HBP    Review of Systems: Noncontributory  PHYSICAL EXAM: Blood pressure 137/82, pulse 77, height 165.1 cm (5' 5), weight 82.7 kg (182 lb 6.4 oz). CONSTITUTIONAL: Well-developed, well-nourished female in no acute distress.  HENT:  Normocephalic, atraumatic, External right and left ear normal. Oropharynx is clear and moist EYES: Conjunctivae and EOM are normal. Pupils are equal, round, and reactive to light. No scleral icterus.  NECK: Normal range of motion, supple, no masses SKIN: Skin  is warm and dry. No rash noted. Not diaphoretic. No erythema. No pallor. NEUROLGIC: Alert and oriented to person, place, and time. Normal reflexes, muscle tone coordination. No cranial nerve deficit noted. PSYCHIATRIC: Normal mood and affect. Normal behavior. Normal judgment and thought content. CARDIOVASCULAR: Normal heart rate noted, regular rhythm RESPIRATORY: Effort and breath sounds normal, no  problems with respiration noted ABDOMEN: Soft, nontender, nondistended. PELVIC: Deferred MUSCULOSKELETAL: Normal range of motion. No edema and no tenderness. 2+ distal pulses.  Labs: No results found for this or any previous visit (from the past 2 weeks).  Imaging Studies: US  [facility] pelvic transvaginal Result Date: 03/26/2024 Indication ======== Transvaginal ultrasound for thickened endometrium. Results ====== TO VIEW THE FORMAL REPORT IN [facility], OPEN THE PDF (click on Imaging result with paper clip icon, then click link under Scans on Order). The plain text version does not include embedded images or graphs. The image link is for reference only. ViewPoint is the official diagnostic image storage site. Endovaginal Imaging ================ Indication: Endovaginal imaging was necessary to evaluate the uterus and ovaries. Probe: [id number]. Uterus ====== Visualized. Size 59 mm x 47 mm x 46 mm Normal Position: anteverted Malformations: none Myometrium: Heterogeneous Endometrium: non-uniform echogenicity: heterogeneous background with irregular cystic areas, color score 2 (minimal color). Endometrial thickness, total 13.4 mm Cervix details: nabothian cysts visualized, The cervical length is 2.4 cm. No polyps identified Fibroid(s) 1.  Subserosal: (5) >50% Intramural. Anterior. Fundal. Homogeneous structure. Size 19.00 mm x 22.00 mm x 17 mm. Mean 19.3 mm. Vol        3.7 cm. Doppler: color score 2 (minimal color)        2.  Intramural. Fundal. Homogeneous structure. Smooth bordered margin. Size 9.00 mm x 7.00 mm x 10  mm. Mean 8.7 mm. Vol 0.3 cm.        Doppler: color score 1 (no color)        3.  Intramural: (4). Left lateral wall. Homogeneous structure. Smooth bordered margin. Size 9.00 mm x 8.00 mm x 9 mm. Mean 8.7 mm.        Vol 0.3 cm. Doppler: color score 1 (no color) Right Ovary ========= Visualized, Normal. Outline: Normal. Morphology: Appropriate. Size 23 mm x 12 mm x 13 mm No cysts identified Follicles identified Right Tube ========= Not visualized Left Ovary ======== Visualized, Normal. Outline: Normal. Morphology: Appropriate. Size 19 mm x 16 mm x 11 mm No cysts identified Follicles identified Left Tube ======== Not visualized Cul de Sac ========= Normal. No free fluid visualized Impression ========= Endovaginal ultrasound performed today due to the indications outlined above. The sonogram reveals a anteverted uterus. Myometrium is heterogenous in echotexture containing multiple fibroids as previously seen. Fibroids appear stable in size from prior imaging. The endometrium appears grossly thickened with irregular poorly defined margins. Multiple small (<75mm) anechoic cystic fluid collections seen scattered throughout endometrial parenchyma. The ovaries were visualized bilaterally and appear normal. There are no unusual adnexal findings. There is no free fluid.   Assessment: Problem List Items Addressed This Visit     Endometrial thickening on ultrasound - Primary   Other Visit Diagnoses       Cervical stenosis (uterine cervix)         Use of tamoxifen (Nolvadex)           Plan: Patient will undergo surgical management with the above-noted surgery.   The risks of surgery were discussed in detail with the patient including but not limited to: bleeding which may require transfusion or reoperation; infection which may require antibiotics; injury to surrounding organs which may involve bowel, bladder, ureters ; need for additional procedures including laparoscopy or laparotomy; thromboembolic  phenomenon, surgical site problems and other postoperative/anesthesia complications. Likelihood of success in alleviating the patient's condition was discussed. Routine postoperative instructions will be reviewed with the patient and her family in detail  after surgery.  The patient concurred with the proposed plan, giving informed written consent for the surgery.   Preoperative prophylactic antibiotics, as indicated, and SCDs ordered on call to the OR.    Return in 6 weeks (on 05/24/2024) for Post-op check.   Attestation Statement:   I personally performed the service. (TP)  Marchell Froman TORIBIO MACE, MD  [facility] OB/GYN [facility] 04/15/2024 3:26 PM

## 2024-04-16 ENCOUNTER — Encounter (HOSPITAL_COMMUNITY): Payer: Self-pay

## 2024-04-16 MED ORDER — CAMZYOS 5 MG PO CAPS: 5.0000 mg | ORAL_CAPSULE | Freq: Every day | ORAL | 5 refills | Status: DC

## 2024-04-16 NOTE — Telephone Encounter (Signed)
 Called pt advised MD review of stress echocardiogram.   Dec echo canceled.  Pt has not started any new medication or express any HF symptoms.   PSF updated on [facility]  REMS portal.  Refill of mavacamten  sent to specialty pharmacy on file.

## 2024-04-29 ENCOUNTER — Other Ambulatory Visit: Payer: Self-pay

## 2024-04-29 ENCOUNTER — Encounter
Admission: RE | Admit: 2024-04-29 | Discharge: 2024-04-29 | Disposition: A | Discharge status: Home / Self Care | Source: Ambulatory Visit | Attending: Obstetrics and Gynecology | Admitting: Obstetrics and Gynecology

## 2024-04-29 VITALS — Wt 182.0 lb

## 2024-04-29 DIAGNOSIS — Z79899 Other long term (current) drug therapy: Secondary | ICD-10-CM

## 2024-04-29 DIAGNOSIS — I25118 Atherosclerotic heart disease of native coronary artery with other forms of angina pectoris: Secondary | ICD-10-CM

## 2024-04-29 DIAGNOSIS — N882 Stricture and stenosis of cervix uteri: Secondary | ICD-10-CM

## 2024-04-29 DIAGNOSIS — Z01812 Encounter for preprocedural laboratory examination: Secondary | ICD-10-CM

## 2024-04-29 DIAGNOSIS — I10 Essential (primary) hypertension: Secondary | ICD-10-CM

## 2024-04-29 DIAGNOSIS — D696 Thrombocytopenia, unspecified: Secondary | ICD-10-CM

## 2024-04-29 DIAGNOSIS — I421 Obstructive hypertrophic cardiomyopathy: Secondary | ICD-10-CM

## 2024-04-29 HISTORY — DX: Unspecified right bundle-branch block: I45.10

## 2024-04-29 HISTORY — DX: Primary hyperparathyroidism: E21.0

## 2024-04-29 HISTORY — DX: Stricture and stenosis of cervix uteri: N88.2

## 2024-04-29 HISTORY — DX: Thrombocytopenia, unspecified: D69.6

## 2024-04-29 HISTORY — DX: Other specified abnormal findings of blood chemistry: R79.89

## 2024-04-29 HISTORY — DX: Abnormal findings on diagnostic imaging of other specified body structures: R93.89

## 2024-04-29 NOTE — Patient Instructions (Addendum)
 Your procedure is scheduled on:05-06-24 Thursday Report to the Registration Desk on the 1st floor of the Medical Mall.Then proceed to the 2nd floor Surgery Desk To find out your arrival time, please call [phone] between 1PM - 3PM on:05-05-24 Wednesday If your arrival time is 6:00 am, do not arrive before that time as the Medical Mall entrance doors do not open until 6:00 am.  REMEMBER: Instructions that are not followed completely may result in serious medical risk, up to and including death; or upon the discretion of your surgeon and anesthesiologist your surgery may need to be rescheduled.  Do not eat food after midnight the night before surgery.  No gum chewing or hard candies.  You may however, drink CLEAR liquids up to 2 hours before you are scheduled to arrive for your surgery. Do not drink anything within 2 hours of your scheduled arrival time.  Clear liquids include: - water  - apple juice without pulp - gatorade (not RED colors) - black coffee or tea (Do NOT add milk or creamers to the coffee or tea) Do NOT drink anything that is not on this list.  In addition, your doctor has ordered for you to drink the provided:  Ensure Pre-Surgery Clear Carbohydrate Drink  Drinking this carbohydrate drink up to two hours before surgery helps to reduce insulin resistance and improve patient outcomes. Please complete drinking 2 hours before scheduled arrival time.  One week prior to surgery:Stop NOW (04-29-24) Stop Anti-inflammatories (NSAIDS) such as Advil , Aleve, Ibuprofen , Motrin , Naproxen, Naprosyn and Aspirin  based products such as Excedrin, Goody's Powder, BC Powder. Stop ANY OVER THE COUNTER supplements until after surgery (Probiotic, Magnesium, Multivitamin, Vitamin D , Zinc)  You may however, continue to take Tylenol  if needed for pain up until the day of surgery.  Continue taking all of your other prescription medications up until the day of surgery.  ON THE DAY OF SURGERY  ONLY TAKE THESE MEDICATIONS WITH SIPS OF WATER: -mavacamten  (CAMZYOS )  -metoprolol  succinate (TOPROL -XL)  -sertraline  (ZOLOFT )   Continue your 81 mg Aspirin  up until the day prior to surgery-Do NOT take the day of surgery  No Alcohol for 24 hours before or after surgery.  No Smoking including e-cigarettes for 24 hours before surgery.  No chewable tobacco products for at least 6 hours before surgery.  No nicotine patches on the day of surgery.  Do not use any recreational drugs for at least a week (preferably 2 weeks) before your surgery.  Please be advised that the combination of cocaine and anesthesia may have negative outcomes, up to and including death. If you test positive for cocaine, your surgery will be cancelled.  On the morning of surgery brush your teeth with toothpaste and water, you may rinse your mouth with mouthwash if you wish. Do not swallow any toothpaste or mouthwash.  Do not wear jewelry, make-up, hairpins, clips or nail polish.  For welded (permanent) jewelry: bracelets, anklets, waist bands, etc.  Please have this removed prior to surgery.  If it is not removed, there is a chance that hospital personnel will need to cut it off on the day of surgery.  Do not wear lotions, powders, or perfumes.   Do not shave body hair from the neck down 48 hours before surgery.  Contact lenses, hearing aids and dentures may not be worn into surgery.  Do not bring valuables to the hospital. [facility] is not responsible for any missing/lost belongings or valuables.   Notify your doctor if there  is any change in your medical condition (cold, fever, infection).  Wear comfortable clothing (specific to your surgery type) to the hospital.  After surgery, you can help prevent lung complications by doing breathing exercises.  Take deep breaths and cough every 1-2 hours. Your doctor may order a device called an Incentive Spirometer to help you take deep breaths. When coughing or  sneezing, hold a pillow firmly against your incision with both hands. This is called "splinting." Doing this helps protect your incision. It also decreases belly discomfort.  If you are being admitted to the hospital overnight, leave your suitcase in the car. After surgery it may be brought to your room.  In case of increased patient census, it may be necessary for you, the patient, to continue your postoperative care in the Same Day Surgery department.  If you are being discharged the day of surgery, you will not be allowed to drive home. You will need a responsible individual to drive you home and stay with you for 24 hours after surgery.   If you are taking public transportation, you will need to have a responsible individual with you.  Please call the Pre-admissions Testing Dept. at [phone] if you have any questions about these instructions.  Surgery Visitation Policy:  Patients having surgery or a procedure may have two visitors.  Children under the age of 73 must have an adult with them who is not the patient.   Merchandiser, Retail to address health-related social needs:  [email or web address]

## 2024-04-30 ENCOUNTER — Telehealth (HOSPITAL_BASED_OUTPATIENT_CLINIC_OR_DEPARTMENT_OTHER): Payer: Self-pay | Admitting: *Deleted

## 2024-04-30 ENCOUNTER — Encounter: Payer: Self-pay | Admitting: Obstetrics and Gynecology

## 2024-04-30 NOTE — Telephone Encounter (Signed)
   Name: Mary Walter  DOB: [birth year]  MRN: [id number]  Primary Cardiologist: Evalene Lunger, MD  Chart reviewed as part of pre-operative protocol coverage. Because of [name]'s past medical history and time since last visit, she will require a follow-up in-office visit in order to better assess preoperative cardiovascular risk. Just seen on 03/15/2024 by Orren Fabry, but complicated history. Needs EKG also to compare as abnormal on last visit. Surgery scheduled for 05/05/2024.  Pre-op covering staff: - Please schedule appointment and call patient to inform them. If patient already had an upcoming appointment within acceptable timeframe, please add pre-op clearance to the appointment notes so provider is aware. - Please contact requesting surgeon's office via preferred method (i.e, phone, fax) to inform them of need for appointment prior to surgery.  Lamarr Satterfield, NP  04/30/2024, 9:02 AM

## 2024-04-30 NOTE — Telephone Encounter (Signed)
 Pt has been scheduled 05/04/24 with [name], PAC. Will update all parties involved.

## 2024-04-30 NOTE — Telephone Encounter (Signed)
 Per Lamarr Satterfield, DNP urgent request. Procedure is 05/06/24  I will send a message to [facility] as I just reviewed schedules and do not see where to get pt in time.   Will see [facility] scheduler can add pt in to be seen.

## 2024-04-30 NOTE — Telephone Encounter (Signed)
-----   Message from Mary Walter sent at 04/30/2024  8:12 AM EST ----- Regarding: Request for pre-operative cardiac clearance Request for pre-operative cardiac clearance:  1. What type of surgery is being performed?   DILATATION AND CURETTAGE /HYSTEROSCOPY  2. When is this surgery scheduled?  05/06/2024  3. Type of clearance being requested (medical, pharmacy, both)? medical   4. Are there any medications that need to be held prior to surgery? ASA  5. Practice name and name of physician performing surgery?  Performing surgeon: Dr. Garnette Mace, MD Requesting clearance: Mary Pereyra, FNP-C    6. Anesthesia type (none, local, MAC, general)? GENERAL  7. What is the office phone and fax number?   Phone: [phone] Fax: Return FAX not required. I will follow up in [facility].   ATTENTION: Unable to create telephone message as per your standard workflow. Directed by [facility] providers to send requests for cardiac clearance to this pool for appropriate distribution to provider covering pre-operative clearances.   Mary Pereyra, MSN, APRN, FNP-C, CEN [facility]  Peri-operative Services Nurse Practitioner Phone: [phone] 04/30/24 8:12 AM

## 2024-04-30 NOTE — Telephone Encounter (Signed)
   Pre-operative Risk Assessment    Patient Name: Mary Walter  DOB: [birth year] MRN: [id number]   Date of last office visit: 03/15/24 Mary Walter, [facility] Date of next office visit: NONE   Request for Surgical Clearance    Procedure:  D&C/HYSTEROSCOPY  Date of Surgery:  Clearance 05/06/24                                Surgeon: DR. GARNETTE MACE Surgeon's Group or Practice Name:  [facility]  Phone number:  [phone] Mary PEREYRA, FNP Fax number:  Return FAX not required. I will follow up in [facility].    Type of Clearance Requested:   - Medical  - Pharmacy:  Hold Aspirin      Type of Anesthesia:  General    Additional requests/questions:    Mary Walter   04/30/2024, 8:43 AM

## 2024-05-03 ENCOUNTER — Encounter
Admission: RE | Admit: 2024-05-03 | Discharge: 2024-05-03 | Disposition: A | Discharge status: Home / Self Care | Source: Ambulatory Visit | Attending: Obstetrics and Gynecology | Admitting: Obstetrics and Gynecology

## 2024-05-03 ENCOUNTER — Encounter: Payer: Self-pay | Admitting: Obstetrics and Gynecology

## 2024-05-03 DIAGNOSIS — I25118 Atherosclerotic heart disease of native coronary artery with other forms of angina pectoris: Secondary | ICD-10-CM | POA: Insufficient documentation

## 2024-05-03 DIAGNOSIS — Z79899 Other long term (current) drug therapy: Secondary | ICD-10-CM | POA: Diagnosis not present

## 2024-05-03 DIAGNOSIS — Z01812 Encounter for preprocedural laboratory examination: Secondary | ICD-10-CM | POA: Diagnosis not present

## 2024-05-03 DIAGNOSIS — I421 Obstructive hypertrophic cardiomyopathy: Secondary | ICD-10-CM | POA: Insufficient documentation

## 2024-05-03 DIAGNOSIS — I10 Essential (primary) hypertension: Secondary | ICD-10-CM | POA: Diagnosis not present

## 2024-05-03 DIAGNOSIS — D696 Thrombocytopenia, unspecified: Secondary | ICD-10-CM

## 2024-05-03 LAB — BASIC METABOLIC PANEL WITH GFR
Anion gap: 12 (ref 5–15)
BUN: 15 mg/dL (ref 8–23)
CO2: 24 mmol/L (ref 22–32)
Calcium: 10.3 mg/dL (ref 8.9–10.3)
Chloride: 106 mmol/L (ref 98–111)
Creatinine, Ser: 0.94 mg/dL (ref 0.44–1.00)
GFR, Estimated: >60 mL/min (ref >60)
Glucose, Bld: 138 mg/dL — ABNORMAL HIGH (ref 70–99)
Potassium: 3.9 mmol/L (ref 3.5–5.1)
Sodium: 142 mmol/L (ref 135–145)

## 2024-05-03 LAB — CBC
HCT: 40.9 % (ref 36.0–46.0)
Hemoglobin: 14.2 g/dL (ref 12.0–15.0)
MCH: 31.5 pg (ref 26.0–34.0)
MCHC: 34.7 g/dL (ref 30.0–36.0)
MCV: 90.7 fL (ref 80.0–100.0)
Platelets: 174 K/uL (ref 150–400)
RBC: 4.51 MIL/uL (ref 3.87–5.11)
RDW: 12.8 % (ref 11.5–15.5)
WBC: 8.6 K/uL (ref 4.0–10.5)
nRBC: 0 % (ref 0.0–0.2)

## 2024-05-03 NOTE — Progress Notes (Signed)
 Perioperative / Anesthesia Services  Pre-Admission Testing Clinical Review / Pre-Operative Anesthesia Consult  Date: 05/04/24  PATIENT DEMOGRAPHICS: Name: Mary Walter DOB: 1955-11-14 MRN:   [id number]  Note: Available PAT nursing documentation and vital signs have been reviewed. Clinical nursing staff has updated patient's PMH/PSHx, current medication list, and drug allergies/intolerances to ensure complete and comprehensive history available to assist care teams in MDM as it pertains to the aforementioned surgical procedure and anticipated anesthetic course. Extensive review of available clinical information personally performed. Nursing documentation reviewed. Luis Llorens Torres PMH and PSHx updated with any diagnoses and/or procedures that I have knowledge of that may have been inadvertently omitted during her intake with the pre-admission testing department's nursing staff.  PLANNED SURGICAL PROCEDURE(S):   Case: [id number] Date/Time: 05/06/24 1057   Procedure: DILATATION AND CURETTAGE /HYSTEROSCOPY (Cervix) - REMOVAL OF ABNORMAL TISSUE   Anesthesia type: Choice   Diagnosis:      Endometrial thickening on ultrasound [R93.89]     Cervical stenosis (uterine cervix) [N88.2]   Pre-op diagnosis:      endometrial thickening on ultrasound     cervical stenosis   Location: [facility] OR ROOM 07 / [facility]   Surgeons: Leonce Garnette BIRCH, MD        CLINICAL DISCUSSION: Mary Walter is a 68 y.o. female who is submitted for pre-surgical anesthesia review and clearance prior to her undergoing the above procedure. Patient has never been a smoker in the past. Pertinent PMH includes: CAD, HOCM, MVP, diastolic dysfunction, NSVT, PSVT, palpitations, systolic murmur, IRBBB, angina, HTN, HLD, hyperparathyroidism, dyspnea, RLL pulmonary nodule, thrombocytopenia, breast cancer, endometrial thickening/uterine cervix stenosis, anxiety.   Patient is followed by cardiology  Luiz, MD). She was last seen in the cardiology clinic on 05/04/2024; notes reviewed. At the time of her clinic visit, patient doing well overall from a cardiovascular perspective. Patient denied any chest pain, shortness of breath, PND, orthopnea, palpitations, significant peripheral edema, weakness, fatigue, vertiginous symptoms, or presyncope/syncope. Patient with a past medical history significant for cardiovascular diagnoses. Documented physical exam was grossly benign, providing no evidence of acute exacerbation and/or decompensation of the patient's known cardiovascular conditions.  Most recent myocardial perfusion imaging study was performed on 11/03/2015 revealing a normal left ventricular systolic function with an EF of 55-65%.  There were no regional wall motion abnormalities.  No artifact or left ventricular cavity size enlargement appreciated on review of imaging.  Blood pressure demonstrated a hypertensive response to exercise.  There were horizontal ST segment depression of 2 mm noted during stress in leads II, III, aVF, and V3-V6.  SPECT images demonstrated no evidence of stress-induced myocardial ischemia or arrhythmia; no scintigraphic evidence of scar.  TID ratio = 1.03 (normal range </= 1.2). Study determined to be normal and low risk.  Coronary CTA was performed on 01/14/2022 that demonstrated an Agatston coronary artery calcium  score of 216. This placed patient in the 87th percentile for age, sex, and race matched controls. Calcium  depositions noted to be isolated mainly in the proximal LAD (25-49%) and ostial RCA (<25%) distributions.  Study demonstrated normal coronary origin with RIGHT dominance.   Cardiac MRI was performed on 01/30/2022 revealing a normal left ventricular systolic function with an EF of 73%.  There was severe asymmetric LV basal septal hypertrophy, with basal septal segment measuring up to 1.6 cm.  Transmural LGE/scar present in the left ventricular basal  segment.  Systolic anterior motion (SAM) of the mitral valve causing LVOT obstruction and mild mitral  valve regurgitation was noted.  Findings consistent with hypertrophic obstructive cardiomyopathy (HOCM).  Long-term cardiac event monitor study performed on 02/07/2022 revealed a predominant underlying sinus rhythm with a bundle branch block at an average rate of 72 bpm; range 52-182 bpm.  There were 3 runs of NSVT with the fastest interval lasting 4 beats at a maximum rate of 152, and the longest lasting 5 beats at an average rate of 122 bpm.  10 runs of PSVT occurred with the fastest lasting 4 beats at a max rate of 182, and the longest lasting 15.1 seconds and an average rate of 103 bpm.  Rare atrial and ventricular ectopy was observed.  There were no sustained arrhythmias or prolonged pauses.  Patient triggered events all associated with rare PACs.  Most recent stress echocardiogram was performed on 04/09/2024 revealing a normal left ventricular systolic function with an EF of 60%.  There was mild central mitral valve regurgitation related to an anterior leaflet prolapse.  Resting LVOT gradient was 14 mmHg; Valsalva gradient 17 mmHg with prominent MR Doppler artifact.  Squat to stand LVOT gradient of 17 at the greatest.  Most exercise LVOT gradient, at best estimate was 33 mmHg with prominent MR Doppler artifact.  Mitral valve regurgitation largely unchanged throughout provocation.  There were no exercise-induced arrhythmias, hypertension, or syncopal episodes.  Overall, there was a slight increase in LVOT gradient from resting imaging, however improved from prior stress testing.  HOCM being managed on mavacamten . Blood pressure well controlled at 115/70 mmHg on currently prescribed diuretic (furosemide ) and beta-blocker (metoprolol  succinate) therapies.  Patient is on atorvastatin  for her HLD diagnosis and ASCVD prevention. Patient is not diabetic. She does not have an OSAH diagnosis. Patient is able to  complete all of her  ADL/IADLs without cardiovascular limitation.  Per the [facility]AS[facility], patient is able to achieve at least 4 METS of physical activity without experiencing any significant degree of angina/anginal equivalent symptoms. No changes were made to her medication regimen during her visit with cardiology.  Patient scheduled to follow-up with outpatient cardiology in 6 months or sooner if needed.  Mary Walter is scheduled for an elective DILATATION AND CURETTAGE /HYSTEROSCOPY (Cervix) on 05/06/2024 with Dr. Garnette JONETTA Mace, MD. Given patient's past medical history significant for cardiovascular diagnoses, presurgical cardiac clearance was sought by the PAT team. Per cardiology, METs greater than 4. RCRI = 1.1% risk of MACE. Patient is overall stable from a cardiac perspective.  EKG is stable today.  Recent stress echo was reassuring. She is euvolemic on exam.  Patient is able to walk 1-2 blocks and up a flight of stairs.  Avoid dehydration, hypotension, tachycardia during procedure. Ok to proceed without any further cardiac work-up.  In review of the patient's medication reconciliation, it is noted that she is on daily oral antithrombotic therapy. Given that patient's past medical history is significant for cardiovascular diagnoses, including but not limited to CAD, OB/GYN has cleared patient to continue her daily low dose ASA throughout her perioperative course.  Patient has been updated on these directives from her specialty care providers by the PAT team.  Patient denies previous perioperative complications with anesthesia in the past. In review her EMR, it is noted that patient underwent a general anesthetic course here at [facility] ([facility] in 09/2022 without documented complications.   MOST RECENT VITAL SIGNS:    05/04/2024    2:43 PM 04/29/2024    4:00 PM 03/15/2024    3:28 PM  Vitals with BMI  Height 5' 5  5' 5  Weight 183 lbs 182 lbs 185 lbs 6  oz  BMI 30.45  30.85  Systolic 115  118  Diastolic 70  62  Pulse 80  81   PROVIDERS/SPECIALISTS: NOTE: Primary physician provider listed below. Patient may have been seen by APP or partner within same practice.   PROVIDER ROLE / SPECIALTY LAST [place]ERLEAN Leonce Garnette JONETTA, MD OB/GYN (Surgeon) 04/15/2024  Glendia Shad, MD Primary Care Provider 02/29/2024  Santo Rowan, MD Cardiology 05/04/2024   ALLERGIES: Allergies  Allergen Reactions   Macrobid [Nitrofurantoin Macrocrystal]     unknown   Penicillins     Childhood allergy    CURRENT HOME MEDICATIONS: No current facility-administered medications for this encounter.    aspirin  EC 81 MG EC tablet   atorvastatin  (LIPITOR) 40 MG tablet   clotrimazole -betamethasone  (LOTRISONE ) cream   furosemide  (LASIX ) 20 MG tablet   lactobacillus acidophilus (BACID) TABS tablet   magnesium gluconate (MAGONATE) 500 MG tablet   mavacamten  (CAMZYOS ) 5 MG CAPS capsule   metoprolol  succinate (TOPROL -XL) 100 MG 24 hr tablet   Multiple Vitamin (MULTIVITAMIN) tablet   mupirocin  ointment (BACTROBAN ) 2 %   sertraline  (ZOLOFT ) 50 MG tablet   tamoxifen (NOLVADEX) 20 MG tablet   Vitamin D , Cholecalciferol, 10 MCG (400 UNIT) CAPS   zinc gluconate 50 MG tablet   HISTORY: Past Medical History:  Diagnosis Date   Anxiety    a.) Tx'd with SSRI   Breast cancer (HCC)    Cervical stenosis (uterine cervix)    Chest pain    Diastolic dysfunction    Dyspnea    Endometrial thickening on ultrasound    Family history of adverse reaction to anesthesia    SON-LUNGS FILLED UP WITH FLUID AFTER APPENDECTOMY   Hematuria    HOCM (hypertrophic obstructive cardiomyopathy) (HCC)    a. 10/2019 Echo: EF 70-75%, mod LVH. No rwma. Mild SAM, mild LVOT obs(peak grad 20 @ rest 26 w/ Valsalva); b. 01/2022 cMRI: EF 73%, sev asymm LV basal septal hypertrophy- basal segment 1.6cm. Transmural LGE/scar in LV basal segment (point of max thickness & RV insertion). SAM->mild MR &  LVOT obs-->HOCM.   Hypercholesterolemia    Hypertension    Incomplete right bundle branch block (RBBB)    Long-term use of aspirin  therapy    MVP (mitral valve prolapse); anterior valve leaflet    Nonobstructive CAD (coronary artery disease)    a.10/2015 MV: No ischemia/infarct; b. 11/2019 Cor CTA: mild nonobs LAD dzs; c. 12/2021 Cor CTA: Ca2+ = 216 (87th%'ile). LM nl, LAD 25-49p, LCX nondom, nl, RCA <25ost.   NSVT (nonsustained ventricular tachycardia) (HCC)    Palpitations    a. 12/2021 Zio: RSR @ 72 (52-182). 3 runs NSVT (longest 5 beats, fastest 152 x 4 beats). 10 SVT runs (longest 15.1 secs @ 103, fastest 4 beats @ 182). Rare PACs/PVCs.   Personal history of radiation therapy    Primary hyperparathyroidism    PSVT (paroxysmal supraventricular tachycardia)    Right lower lobe pulmonary nodule    a. 12/2021 CT chest: stable 4mm RLL pulm nodule, likely benign, No f/u imaging req.   Systolic murmur    Thrombocytopenia    Past Surgical History:  Procedure Laterality Date   BREAST BIOPSY Left 10/27/2019   Affirm bx-distortion-     clip-path ADH   BREAST BIOPSY Left 01/07/2020   Affim bx 1 area/ x clip and ribbon clip/ ADH   BREAST BIOPSY Left 09/01/2023  MM LT BREAST BX W LOC DEV 1ST LESION IMAGE BX SPEC STEREO GUIDE 09/01/2023 [facility]   BREAST LUMPECTOMY     BREAST LUMPECTOMY WITH NEEDLE LOCALIZATION Left 02/14/2020   Procedure: BREAST LUMPECTOMY WITH NEEDLE LOCALIZATION;  Surgeon: Dessa Reyes ORN, MD;  Location: [facility] ORS;  Service: General;  Laterality: Left;   CHOLECYSTECTOMY  10/2020   COLONOSCOPY WITH PROPOFOL  N/A 10/18/2022   Procedure: COLONOSCOPY WITH PROPOFOL ;  Surgeon: Maryruth Ole DASEN, MD;  Location: [facility] ENDOSCOPY;  Service: Endoscopy;  Laterality: N/A;   FACIAL FRACTURE SURGERY     X 2   Family History  Problem Relation Age of Onset   Diabetes Mother    Heart disease Mother    Hypertension Mother    Cancer Father        prostate    Diverticulitis Father     CVA Father    Heart disease Father    Stroke Father    Hypertension Father    Heart murmur Father    Breast cancer Sister    Heart attack Brother    CVA Paternal Grandmother    Breast cancer Cousin    Social History   Tobacco Use   Smoking status: Never   Smokeless tobacco: Never  Substance Use Topics   Alcohol use: No    Alcohol/week: 0.0 standard drinks of alcohol   LABS:  Hospital Outpatient Visit on 05/03/2024  Component Date Value Ref Range Status   Sodium 05/03/2024 142  135 - 145 mmol/L Final   Potassium 05/03/2024 3.9  3.5 - 5.1 mmol/L Final   Chloride 05/03/2024 106  98 - 111 mmol/L Final   CO2 05/03/2024 24  22 - 32 mmol/L Final   Glucose, Bld 05/03/2024 138 (H)  70 - 99 mg/dL Final   Glucose reference range applies only to samples taken after fasting for at least 8 hours.   BUN 05/03/2024 15  8 - 23 mg/dL Final   Creatinine, Ser 05/03/2024 0.94  0.44 - 1.00 mg/dL Final   Calcium  05/03/2024 10.3  8.9 - 10.3 mg/dL Final   GFR, Estimated 05/03/2024 >60  >60 mL/min Final   Comment: (NOTE) Calculated using the CKD-EPI Creatinine Equation (2021)    Anion gap 05/03/2024 12  5 - 15 Final   Performed at [facility], [address] [place], [place] [address]   WBC 05/03/2024 8.6  4.0 - 10.5 K/uL Final   RBC 05/03/2024 4.51  3.87 - 5.11 MIL/uL Final   Hemoglobin 05/03/2024 14.2  12.0 - 15.0 g/dL Final   HCT 88/89/7974 40.9  36.0 - 46.0 % Final   MCV 05/03/2024 90.7  80.0 - 100.0 fL Final   MCH 05/03/2024 31.5  26.0 - 34.0 pg Final   MCHC 05/03/2024 34.7  30.0 - 36.0 g/dL Final   RDW 88/89/7974 12.8  11.5 - 15.5 % Final   Platelets 05/03/2024 174  150 - 400 K/uL Final   nRBC 05/03/2024 0.0  0.0 - 0.2 % Final   Performed at [facility], [address] [place], [place] [address]    ECG: Date: 03/15/2024 Time ECG obtained: 1525 PM Rate: 81 bpm Rhythm: Normal sinus rhythm; RBBB Axis (leads I and aVF): normal Intervals: PR 164 ms. QRS 134  ms. QTc 469 ms. ST segment and T wave changes: No evidence of acute T wave abnormalities or significant ST segment elevation or depression.  Evidence of a possible, age undetermined, prior infarct:  No  Comparison: Similar to previous tracing  obtained on 03/14/2023  IMAGING / PROCEDURES: ECHOCARDIOGRAM STRESS TEST performed on 04/09/2024 This is an inconclusive stress echocardiogram for ischemia. This is a low risk study. Prior to procedure. LVEF 60%. Mild, central mitral regurgitation related to anterior leaflet prolapse. No significant aortic valve, pulmonic valve or tricuspid valve regurgitation. Tri-leaflet aortic valve. Normal aortic root and ascending aortic measurements. Normal RV size and function.  Resting LVOT gradient 14 mm Hg. Valsalva gradient 17 mm Hg with prominent MR Doppler artifact.  Squat to stand LVOT gradient of 17 mm Hg at greatest.  Most exercise LVOT gradient, at best estimate 33 mm Hg with prominent MR Doppler artifact.  Mild mitral regurgitation largely unchanged through provocation.  No exercise induced arhythmias, hypotension, or syncope. Overall, slight increase in LVOT gradient from resting imaging but improved from prior stress testing.   US  FDC PELVIC TRANSVAGINAL performed on 03/26/2024 Endovaginal ultrasound performed today due to the indications outlined above.  The sonogram reveals a anteverted uterus. Myometrium is heterogenous in echotexture containing multiple fibroids as previously seen. Fibroids appear stable in size from prior imaging. The endometrium appears grossly thickened with irregular poorly defined margins. Multiple small (<14mm) anechoic cystic fluid collections seen scattered throughout endometrial parenchyma.  The ovaries were visualized bilaterally and appear normal.  There are no unusual adnexal findings.  There is no free fluid.   TRANSTHORACIC ECHOCARDIOGRAM performed on 12/19/2023 Left ventricular ejection fraction, by estimation, is  65 to 70%. Left ventricular ejection fraction by 3D volume is 60 %. The left ventricle has normal function. There is mild concentric left ventricular hypertrophy of the basal-septal segment. The average left ventricular global longitudinal strain is -18.9 %. The global longitudinal strain is normal.  There is mild chordal SAM with a max intracavitary gradient of (Valsalva).  Right ventricular systolic function is normal. The right ventricular size is normal.  Mild mitral valve regurgitation.  The aortic valve is normal in structure. Aortic valve regurgitation is not visualized.   LONG TERM CARDIAC EVENT MONITOR STUDY performed on 09/10/2022 Patient had a minimum heart rate of 52 bpm, maximum heart rate of 104 bpm, and average heart rate of 69 bpm. Predominant underlying rhythm was sinus rhythm. Isolated PACs were rare (<1.0%) Isolated PVCs were rare (<1.0%). No NSVT in the setting of HCM  MR CARDIAC MORPHOLOGY W WO CONTRAST performed on 01/30/2022 Normal LV and RV function, LVEF 73% Severe asymmetric LV basal septal hypertrophy, basal septal segment measuring 1.6 cm. Transmural LGE/scar present in the LV basal segment (point of maximal thickness and RV insertion) There is systolic anterior motion (SAM) of mitral valve causing LVOT obstruction and mild Mitral regurgitation. Findings consistent with Hypertrophic obstructive Cardiomyopathy (HOCM).  CT CORONARY MORPH W/CTA COR W/SCORE W/CA W/CM &/OR WO/CM performed on 01/14/2022 Stable 4 mm pulmonary nodule in the right lower lobe. This is stable since June 2021 and is likely benign. No follow-up imaging is recommended. No new suspicious pulmonary nodules Coronary calcium  score of 216. This was 87th percentile for age and sex matched control. Normal coronary origin with right dominance. Calcified plaque in the proximal LAD causing mild stenosis (25-49%). Minimal stenosis in the ostial RCA (<25%). CAD-RADS 2. Mild non-obstructive CAD  (25-49%). Consider non-atherosclerotic causes of chest pain. Consider preventive therapy and risk factor modification.  NM MYOCAR MULTI W/SPECT W/WALL MOTION / EF performed on 11/03/2015 Blood pressure demonstrated a hypertensive response to exercise. Horizontal ST segment depression ST segment depression of 2 mm was noted during stress in the II,  III, aVF, V3, V4, V5 and V6 leads The left ventricular ejection fraction is normal (55-65%). Abnormal exercise ECG. However, there is LVH on baseline ECG and thus these changes might represent strain. Correlate clinically. The study is normal This is a low risk study.  IMPRESSION AND PLAN: Mary Walter has been referred for pre-anesthesia review and clearance prior to her undergoing the planned anesthetic and procedural courses. Available labs, pertinent testing, and imaging results were personally reviewed by me in preparation for upcoming operative/procedural course. [facility] medical record has been updated following extensive record review and patient interview with PAT staff.   This patient has been appropriately cleared by cardiology with an overall ACCEPTABLE risk of patient experiencing significant perioperative cardiovascular complications. Based on clinical review performed today (05/04/24), barring any significant acute changes in the patient's overall condition, it is anticipated that she will be able to proceed with the planned surgical intervention. Any acute changes in clinical condition may necessitate her procedure being postponed and/or cancelled. Patient will meet with anesthesia team (MD and/or CRNA) on the day of her procedure for preoperative evaluation/assessment. Questions regarding anesthetic course will be fielded at that time.   Pre-surgical instructions were reviewed with the patient during his PAT appointment, and questions were fielded to satisfaction by PAT clinical staff. She has been instructed on which medications  that she will need to hold prior to surgery, as well as the ones that have been deemed safe/appropriate to take on the day of her procedure. As part of the general education provided by PAT, patient made aware both verbally and in writing, that she would need to abstain from the use of any illegal substances during her perioperative course. She was advised that failure to follow the provided instructions could necessitate case cancellation or result in serious perioperative complications up to and including death. Patient encouraged to contact PAT and/or her surgeon's office to discuss any questions or concerns that may arise prior to surgery; verbalized understanding.   Dorise Pereyra, MSN, APRN, FNP-C, CEN [facility]  Perioperative Services Nurse Practitioner Phone: [phone] Fax: [phone] 05/04/24 3:48 PM  NOTE: This note has been prepared using Dragon dictation software. Despite my best ability to proofread, there is always the potential that unintentional transcriptional errors may still occur from this process.

## 2024-05-04 ENCOUNTER — Encounter: Payer: Self-pay | Admitting: Obstetrics and Gynecology

## 2024-05-04 ENCOUNTER — Encounter: Payer: Self-pay | Admitting: Medical

## 2024-05-04 ENCOUNTER — Ambulatory Visit: Attending: Medical | Admitting: Medical

## 2024-05-04 VITALS — BP 115/70 | HR 80 | Ht 65.0 in | Wt 183.0 lb

## 2024-05-04 DIAGNOSIS — I25118 Atherosclerotic heart disease of native coronary artery with other forms of angina pectoris: Secondary | ICD-10-CM

## 2024-05-04 DIAGNOSIS — E782 Mixed hyperlipidemia: Secondary | ICD-10-CM | POA: Diagnosis not present

## 2024-05-04 DIAGNOSIS — Z01810 Encounter for preprocedural cardiovascular examination: Secondary | ICD-10-CM

## 2024-05-04 DIAGNOSIS — I421 Obstructive hypertrophic cardiomyopathy: Secondary | ICD-10-CM

## 2024-05-04 NOTE — Progress Notes (Signed)
 Cardiology Office Note   Date:  05/04/2024  ID:  Ziomara, Birenbaum 05-10-1956, MRN [id number] PCP: Glendia Shad, MD  [facility] Providers Cardiologist:  Evalene Lunger, MD  History of Present Illness Mary Walter is a 68 y.o. female with a history of HCM who presents for preoperative evaluation.  At time of hokum evaluation peak gradient in 2021 was 26 mmHg.  In 2023 she was found to have exercise-induced hokum started on Mavacamten  in 2024 that was increased to 5 mg daily.  Resolved gradient has resolved.  She follows with Dr. Toney.  She has coronary artery disease with a CT calcium  score of 182 from 2021, indicating mild disease in the left anterior descending artery. Her LDL cholesterol is 47 mg/dL, managed with atorvastatin  40 mg.  Patient was last seen 03/15/2024 and was overall doing well from a cardiac perspective.  Her left ventricular outflow tract gradient increased from 21 to 23 mmHg between April and June.  She was stable on dose of 5 mg of Camzyos . Stress echo was ordered for new RBBB.  Stress echo was low risk with normal pump function, resting LVOT gradient 14 mmHg, Valsalva gradient 17 mmHg.  No exercise-induced arrhythmias, hypotension, or syncope.  Overall, slight increase in LVOT gradient from resting imaging but improved from prior stress testing.  Patient is to undergo I&D and biopsy 05/06/2024 by OBGYN. She is overall stable from a cardiac perspective. She denies chest pain, SOB. lightheadedness, dizziness, lower leg edema. She does no formal exercise. She can 1-2 blocks, and up a flight of stairs. No h/o stroke or DM2.   Studies Reviewed EKG Interpretation Date/Time:  Tuesday May 04 2024 14:50:07 EST Ventricular Rate:  80 PR Interval:  164 QRS Duration:  130 QT Interval:  408 QTC Calculation: 470 R Axis:   48  Text Interpretation: Normal sinus rhythm Possible Left atrial enlargement Non-specific intra-ventricular conduction  block When compared with ECG of 14-Mar-2023 09:57, Nonspecific T wave abnormality, improved in Anterolateral leads Confirmed by Franchester, Elman Dettman ([id number]) on 05/04/2024 3:01:56 PM    Echocardiogram 12/19/2023   IMPRESSIONS     1. Left ventricular ejection fraction, by estimation, is 65 to 70%. Left  ventricular ejection fraction by 3D volume is 60 %. The left ventricle has  normal function. There is mild concentric left ventricular hypertrophy of  the basal-septal segment. The  average left ventricular global longitudinal strain is -18.9 %. The global  longitudinal strain is normal.   2. There is mild chordal SAM with a max intracavitary gradient of  (Valsalva).   3. Right ventricular systolic function is normal. The right ventricular  size is normal.   4. Mild mitral valve regurgitation.   5. The aortic valve is normal in structure. Aortic valve regurgitation is  not visualized.   FINDINGS   Left Ventricle: Left ventricular ejection fraction, by estimation, is 65  to 70%. Left ventricular ejection fraction by 3D volume is 60 %. The left  ventricle has normal function. The average left ventricular global  longitudinal strain is -18.9 %. Strain  was performed and the global longitudinal strain is normal. There is mild  concentric left ventricular hypertrophy of the basal-septal segment.   Right Ventricle: The right ventricular size is normal. Right vetricular  wall thickness was not well visualized. Right ventricular systolic  function is normal.   Mitral Valve: Mild mitral valve regurgitation.   Tricuspid Valve: The tricuspid valve is normal in structure. Tricuspid  valve regurgitation  is not demonstrated.   Aortic Valve: The aortic valve is normal in structure. Aortic valve  regurgitation is not visualized.   Pulmonic Valve: The pulmonic valve was not well visualized. Pulmonic valve  regurgitation is not visualized.   Aorta: The aortic root is normal in size and  structure.          STRESS TESTS   ECHOCARDIOGRAM STRESS TEST 10/24/2022   Narrative EXERCISE STRESS ECHO REPORT     --------------------------------------------------------------------------------   Patient Name:   Mary Walter Date of Exam: 10/24/2022 Medical Rec #:  [id number]          Height:       65.0 in Accession #:    [id number]         Weight:       176.0 lb Date of Birth:  31-Mar-1956          BSA:          1.874 m Patient Age:    24 years           BP:           137/72 mmHg Patient Gender: F                  HR:           72 bpm. Exam Location:  [address]   Procedure: Stress Echo, Cardiac Doppler and Limited Color Doppler   Indications:    I42.2 HOCM   History:        Patient has prior history of Echocardiogram examinations, most recent 07/31/2022. Palpitations.   Sonographer:    Jon Hacker RCS Referring Phys: [id number] [facility] A SANTO     Sonographer Comments: DPR signed on 3.11.14 appointing Lyndy Brunswick (Spouse). May leave message on home machine and work voicemail [email or web address]  IMPRESSIONS     1. Study for for evaluation of inducible LVOT gradient. 2. Prior to procedure, LVEF 65-70%. Normal RV function. Mild mitral regurgitation. Mild tricuspid regurgitation. Pseudonormal diastology. Peak resting gradient estimated at 23 mm Hg with significant artifact. 3. At peak stress horizonal ST depressions in an inferolateral distribution. Peak LVO gradient 74 mm Hg. MR signal not well evaluated. No exercise induced hypotension. 4. In recovery, LVOT gradient recovered to 22 mm Hg. 5. Study is suggestive of a severe, inducible LVOT gradient. 6. This is an inconclusive stress echocardiogram for ischemia. 7. This is an indeterminate risk study.   FINDINGS   Exam Protocol:     Patient Performance: The patient exercised for 4 minutes and 27 seconds, achieving 6.3 METS. The heart rate at peak stress was 131 bpm. The target heart rate was calculated to  be 129 bpm. The percentage of maximum predicted heart rate achieved was 86.1 %. The baseline blood pressure was 137/72 mmHg. The blood pressure at peak stress was 138/56 mmHg. The patient developed fatigue during the stress exam.   EKG: Resting EKG showed normal sinus rhythm. The patient developed No PVCs during exercise.     2D Echo Findings: Baseline regional wall motion abnormalities were not present. This is an inconclusive stress echocardiogram for ischemia.     Stanly Santo MD Electronically signed on 10/25/2022 at 7:59:56 AM         Final   ECHOCARDIOGRAM   ECHOCARDIOGRAM LIMITED 01/17/2023   Narrative ECHOCARDIOGRAM LIMITED REPORT       Patient Name:   Mary Walter Date of Exam: 01/17/2023 Medical Rec #:  [id number]  Height:       65.0 in Accession #:    [id number]         Weight:       176.0 lb Date of Birth:  1955/10/23          BSA:          1.874 m Patient Age:    67 years           BP:           118/70 mmHg Patient Gender: F                  HR:           62 bpm. Exam Location:  [address]   Procedure: Limited Echo, Cardiac Doppler, Limited Color Doppler, 3D Echo and Strain Analysis   Indications:    I42.2 hypertrophic obstructive cardiomyopathy - 5mg  Mevacamten   History:        Patient has prior history of Echocardiogram examinations, most recent 11/29/2022. CAD, Signs/Symptoms:Chest Pain, Shortness of Breath and Murmur; Risk Factors:Hypertension, Dyslipidemia and Family History of Coronary Artery Disease.   Sonographer:    Jon Hacker RCS Referring Phys: [id number] [facility] A CHANDRASEKHAR   IMPRESSIONS     1. Mild intracavitary gradient. Peak velocity 0.86 m/s. Peak gradient 3 mmHg. With Valsalva peak velocity increased to 1.5 m/s, peak gradient 9 mmHg. Left ventricular ejection fraction, by estimation, is 65 to 70%. The left ventricle has normal function. The left ventricle has no regional wall motion abnormalities. There is  moderate left ventricular hypertrophy. Left ventricular diastolic parameters are consistent with Grade I diastolic dysfunction (impaired relaxation). 2. Left atrial size was mildly dilated. 3. Mild mitral valve regurgitation. 4. The aortic valve is tricuspid. Aortic valve regurgitation is not visualized. No aortic stenosis is present.   FINDINGS Left Ventricle: Mild intracavitary gradient. Peak velocity 0.86 m/s. Peak gradient 3 mmHg. With Valsalva peak velocity increased to 1.5 m/s, peak gradient 9 mmHg. Left ventricular ejection fraction, by estimation, is 65 to 70%. The left ventricle has normal function. The left ventricle has no regional wall motion abnormalities. The left ventricular internal cavity size was normal in size. There is moderate left ventricular hypertrophy. Left ventricular diastolic parameters are consistent with Grade I diastolic dysfunction (impaired relaxation). Normal left ventricular filling pressure.   Left Atrium: Left atrial size was mildly dilated.   Mitral Valve: Mild mitral valve regurgitation.   Tricuspid Valve: Tricuspid valve regurgitation is mild.   Aortic Valve: The aortic valve is tricuspid. Aortic valve regurgitation is not visualized. No aortic stenosis is present.   LEFT VENTRICLE PLAX 2D LVIDd:         3.60 cm   Diastology LVIDs:         2.10 cm   LV e' medial:    7.83 cm/s LV PW:         1.50 cm   LV E/e' medial:  8.4 LV IVS:        1.30 cm   LV e' lateral:   11.30 cm/s LVOT diam:     2.10 cm   LV E/e' lateral: 5.8 LV SV:         76 LV SV Index:   40 LVOT Area:     3.46 cm   3D Volume EF: 3D EF:        60 % LV EDV:       173 ml LV ESV:       69  ml LV SV:        104 ml   RIGHT VENTRICLE RV S prime:     8.38 cm/s RVSP:           20.0 mmHg   LEFT ATRIUM             Index        RIGHT ATRIUM LA diam:        3.70 cm 1.97 cm/m   RA Pressure: 3.00 mmHg LA Vol (A2C):   62.1 ml 33.15 ml/m LA Vol (A4C):   55.1 ml 29.41 ml/m LA Biplane  Vol: 60.4 ml 32.24 ml/m AORTIC VALVE LVOT Vmax:   99.00 cm/s LVOT Vmean:  65.100 cm/s LVOT VTI:    0.219 m   AORTA Ao Root diam: 3.30 cm Ao Asc diam:  3.40 cm   MITRAL VALVE               TRICUSPID VALVE MV Area (PHT): 3.81 cm    TR Peak grad:   17.0 mmHg MV Decel Time: 199 msec    TR Vmax:        206.00 cm/s MV E velocity: 65.50 cm/s  Estimated RAP:  3.00 mmHg MV A velocity: 64.50 cm/s  RVSP:           20.0 mmHg MV E/A ratio:  1.02 SHUNTS Systemic VTI:  0.22 m Systemic Diam: 2.10 cm   Mary Scarce MD Electronically signed by Mary Scarce MD Signature Date/Time: 01/20/2023/4:51:11 PM       Final    MONITORS   LONG TERM MONITOR (3-14 DAYS) 09/10/2022   Narrative   Patient had a minimum heart rate of 52 bpm, maximum heart rate of 104 bpm, and average heart rate of 69 bpm.   Predominant underlying rhythm was sinus rhythm.   Isolated PACs were rare (<1.0%).   Isolated PVCs were rare (<1.0%).   No NSVT in the setting of HCM   CT SCANS   CT CORONARY MORPH W/CTA COR W/SCORE 01/14/2022   Addendum 01/14/2022  2:36 PM ADDENDUM REPORT: 01/14/2022 14:34   EXAM: OVER-READ INTERPRETATION  CT CHEST   The following report is an over-read performed by radiologist Dr. Jacob Kahnof [facility] on 01/14/2022. This over-read does not include interpretation of cardiac or coronary anatomy or pathology. The coronary calcium  score interpretation by the cardiologist is attached.   COMPARISON:  CT 12/16/2019.   FINDINGS: Vascular: No significant cardiovascular findings. Coronary artery calcifications.   Mediastinum/Nodes: No lymphadenopathy.   Lungs/Pleura: Stable 4 mm right lower lobe pulmonary nodule (series 12, image 6). This is stable since June 2021 and benign. No new suspicious pulmonary nodules.   Upper Abdomen: No acute findings. Low-density 1.1 cm hepatic lesion consistent with a cyst, unchanged from prior exam.   Musculoskeletal: No acute  osseous abnormality. No suspicious osseous lesion.   IMPRESSION: Stable 4 mm pulmonary nodule in the right lower lobe. This is stable since June 2021 and is likely benign. No follow-up imaging is recommended.   No new suspicious pulmonary nodules.     Electronically Signed By: Jacob  Kahn M.D. On: 01/14/2022 14:34   Narrative CLINICAL DATA:  Chest pain   EXAM: Cardiac/Coronary  CTA   TECHNIQUE: The patient was scanned on a [employer].   FINDINGS: A 100 kV prospective scan was triggered in the descending thoracic aorta at 111 HU's. Axial non-contrast 3 mm slices were carried out through the heart. The data set was analyzed on  a dedicated work station and scored using the [employer] . Gantry rotation speed was 250 msecs and collimation was .6 mm. No beta blockade and 0.8 mg of sl NTG was given. The 3D data set was reconstructed in 5% intervals of the 67-82 % of the R-R cycle. Diastolic phases were analyzed on a dedicated work station using MPR, MIP and VRT modes. The patient received 80 cc of contrast.   Aorta: Normal size. Aortic root and descending aorta calcifications. No dissection.   Aortic Valve:  Trileaflet.  No calcifications.   Coronary Arteries:  Normal coronary origin.  Right dominance.   RCA is a dominant artery that gives rise to PDA and PLA. There is calcified plaque in the ostial RCA causing minimal stenosis (<25%).   Left main gives rise to LAD and LCX arteries.  LM has no disease.   LAD has calcified plaque in the proximal segment causing mild stenosis (25-49%).   LCX is a non-dominant artery that gives rise to two obtuse marginal branches. There is no plaque.   Other findings:   Normal pulmonary vein drainage into the left atrium.   Normal left atrial appendage without a thrombus.   Normal size of the pulmonary artery.   IMPRESSION: 1. Coronary calcium  score of 216. This was 87th percentile for age and sex matched control.    2. Normal coronary origin with right dominance.   3. Calcified plaque in the proximal LAD causing mild stenosis (25-49%).   4. Minimal stenosis in the ostial RCA (<25%).   5. CAD-RADS 2. Mild non-obstructive CAD (25-49%). Consider non-atherosclerotic causes of chest pain. Consider preventive therapy and risk factor modification.   Electronically Signed: By: Redell Cave M.D. On: 01/14/2022 12:40     CT SCANS   CT CORONARY MORPH W/CTA COR W/SCORE 12/16/2019   Addendum 12/16/2019  4:33 PM ADDENDUM REPORT: 12/16/2019 16:30   ADDENDUM: OVER-READ INTERPRETATION  CT CHEST   The following report is an over-read performed by radiologist Dr. Rockey Jacquet [facility] on 12/16/2019. This over-read does not include interpretation of cardiac or coronary anatomy or pathology. The coronary CTA interpretation by the cardiologist is attached.   COMPARISON:   Chest radiograph 11/11/2019   FINDINGS:   Trace bilateral pleural fluid. Right lower lobe pulmonary nodule of 4 mm on 07/11.   Aortic atherosclerosis. Tortuous thoracic aorta. No central pulmonary embolism, on this non-dedicated study.   No imaged thoracic adenopathy.   Normal imaged portions of the liver, spleen, stomach.   No acute osseous abnormality. Mid to upper thoracic vertebral hemangioma.   IMPRESSION:   1.  No acute findings in the imaged extracardiac chest. 2. Trace bilateral pleural fluid. 3. 4 mm right lower lobe pulmonary nodule. No follow-up needed if patient is low-risk. Non-contrast chest CT can be considered in 12 months if patient is high-risk. This recommendation follows the consensus statement: Guidelines for Management of Incidental Pulmonary Nodules Detected on CT Images: From the Fleischner Society 2017; Radiology 2017; 284:228-243.     Electronically Signed By: Rockey Kilts M.D. On: 12/16/2019 16:30   Narrative CLINICAL DATA:  52F with hypertension, hyperlipidemia,  family history of CAD and chest tightness.   EXAM: Cardiac/Coronary  CT   TECHNIQUE: The patient was scanned on a [employer].   FINDINGS: A 120 kV prospective scan was triggered in the descending thoracic aorta at 111 HU's. Axial non-contrast 3 mm slices were carried out through the heart. The data set was analyzed on a  dedicated work station and scored using the [employer] . Gantry rotation speed was 250 msecs and collimation was .6 mm. No beta blockade and 0.8 mg of sl NTG was given. The 3D data set was reconstructed in 5% intervals of the 67-82 % of the R-R cycle. Diastolic phases were analyzed on a dedicated work station using MPR, MIP and VRT modes. The patient received 80 cc of contrast.   Aorta: Normal size. Ascending aorta 3.2 cm. Mild calcification of the aortic root. No dissection.   Aortic Valve:  Trileaflet.  No calcifications.   Coronary Arteries:  Normal coronary origin.  Right dominance.   RCA is a large dominant artery that gives rise to R-PDA and two PLV branches. There is no plaque.   Left main is a large artery that gives rise to LAD and LCX arteries.   LAD is a large vessel that is heavily calcified at the level of D1 but mildly obstructive (25-49%). D1 is a large, branching vessel with minimal (<25%) calcified plaque at the ostium and proximally.   LCX is a large non-dominant artery that gives rise to a small OM1, large OM2 and OM3. There is no plaque.   Other findings:   Normal pulmonary vein drainage into the left atrium.   Normal let atrial appendage without a thrombus.   Normal size of the pulmonary artery.   IMPRESSION: 1. Coronary calcium  score of 182. This was 88th percentile for age and sex matched control.   2. Normal coronary origin with right dominance.   3. There is mild (25-49%) calcified plaque in the proximal LAD. (CAD-RADS 2).   4. Recommend aggressive risk factor modification, including LDL goal <70.    Over-read of the non-cardiac thoracic structures by Radiology is pending.   Mary Scarce, MD   Electronically Signed: By: Mary Walter On: 12/16/2019 15:59   CARDIAC MRI   MR CARDIAC MORPHOLOGY W WO CONTRAST 01/30/2022   Narrative CLINICAL DATA:  Hx of moderate LVH, evaluate for fibrosis, evaluate for HCM   EXAM: CARDIAC MRI   TECHNIQUE: The patient was scanned on a 1.5 Tesla Siemens magnet. A dedicated cardiac coil was used. Functional imaging was done using Fiest[employer] sequences. 2,3, and 4 chamber views were done to assess for RWMA's. Modified Simpson's rule using a short axis stack was used to calculate an ejection fraction on a dedicated work [employer]. The patient received 10 cc of Gadavist . After 10 minutes inversion recovery sequences were used to assess for infiltration and scar tissue. Phase contrast velocity mapping performed.   CONTRAST:  10 cc  of Gadavist    FINDINGS: 1.  Normal left ventricular size and systolic function (LVEF = 73%).   There is severe asymmetric basal septal hypertrophy, basal septal segment measuring 1.6 cm.   There is systolic anterior motion (SAM) of mitral valve causing LVOT obstruction and mild Mitral regurgitation.   There are no regional wall motion abnormalities.   There is transmural late gadolinium enhancement (scar) in the left ventricular myocardium-basal segment at the point of RV insertion.   LVEDV: 184 ml   LVESV: 49 ml   SV: 135 ml   CO: 8.8 L/min   Myocardial mass: 197 g   2. Normal right ventricular size, thickness and systolic function (RVEF = 54%). There are no regional wall motion abnormalities.   3.  Normal left and right atrial size.   4. Normal size of the aortic root, ascending aorta and pulmonary artery.   Systemic  flow: Total volume 91.25 ml, RF 2.25%, RV 2.25ml.   Pulmonary flow: Total volume 89.3 ml, RF 1.76%, RV 1.54ml   Qp/Qs: 0.98   5.  Mild mitral  regurgitation   6.  Normal pericardium.  No pericardial effusion.   IMPRESSION: 1. Normal LV and RV function, LVEF 73%   2. Severe asymmetric LV basal septal hypertrophy, basal septal segment measuring 1.6 cm.   3. Transmural LGE/scar present in the LV basal segment (point of maximal thickness and RV insertion)   4. There is systolic anterior motion (SAM) of mitral valve causing LVOT obstruction and mild Mitral regurgitation.   5. Findings consistent with Hypertrophic obstructive Cardiomyopathy (HOCM).     Electronically Signed By: Redell Cave M.D. On: 01/30/2022 09:48  Physical Exam VS:  BP 115/70 (BP Location: Left Arm, Patient Position: Sitting, Cuff Size: Normal)   Pulse 80 Comment: 85 oximeter  Ht 5' 5 (1.651 m)   Wt 183 lb (83 kg)   LMP 06/24/2010   SpO2 98%   BMI 30.45 kg/m        Wt Readings from Last 3 Encounters:  05/04/24 183 lb (83 kg)  04/29/24 182 lb (82.6 kg)  03/15/24 185 lb 6.4 oz (84.1 kg)    GEN: Well nourished, well developed in no acute distress NECK: No JVD; No carotid bruits CARDIAC: RRR, no murmurs, rubs, gallops RESPIRATORY:  Clear to auscultation without rales, wheezing or rhonchi  ABDOMEN: Soft, non-tender, non-distended EXTREMITIES:  No edema; No deformity   ASSESSMENT AND PLAN  Obstructive HCM with RBBB  Prior LVOT gradient .  Patient recently underwent stress echo for new right bundle branch block.  Stress echo was low risk with normal LVEF, showed gradient with Valsalva improved to 17 mmHg, resting gradient 14 mmHg- this is improved from prior stress testing. She denies any chest pain, SOB, palpitations, lightheadedness, dizziness, syncope. Continue Mavacamten  5mg  daily. Continue Toprol  100mg  daily.   CAD with mild LAD stenosis Cardiac CTA in 2023 showed calcium  score 182, 80th percentile for age and sex matched control, mild LAD stenosis.  The patient denies anginal symptoms as above.  Continue Lipitor 40 mg daily.   She takes aspirin  occasionally.  Hyperlipidemia LDL 47.  Continue Lipitor 40 mg daily.  Preoperative evaluation for I&D and biopsy 05/06/24 Patient is overall stable from a cardiac perspective.  EKG is stable today.  Recent stress echo was reassuring.  Patient takes aspirin  occasionally, which can be held per surgeons intrsuctions.  She is euvolemic on exam.  Patient is able to walk 1-2 blocks and up a flight of stairs.  Avoid dehydration, hypotension, tachycardia during procedure. Ok to proceed with out any further cardiac work-up.  METs greater than 4 RCRI = 1.1% risk of MACE       Dispo: Follow-up in 6 months  Signed, Garlon Tuggle VEAR Fishman, PA-C

## 2024-05-04 NOTE — Patient Instructions (Addendum)
 Medication Instructions:  Your physician recommends that you continue on your current medications as directed. Please refer to the Current Medication list given to you today.    *If you need a refill on your cardiac medications before your next appointment, please call your pharmacy*  Lab Work: No labs ordered today    Testing/Procedures: No test ordered today   Follow-Up: At [facility], you and your health needs are our priority.  As part of our continuing mission to provide you with exceptional heart care, our providers are all part of one team.  This team includes your primary Cardiologist (physician) and Advanced Practice Providers or APPs (Physician Assistants and Nurse Practitioners) who all work together to provide you with the care you need, when you need it.  Your next appointment:   4 month(s)  Provider:   Dr. Toney

## 2024-05-06 ENCOUNTER — Encounter
Admission: RE | Disposition: A | Discharge status: Home / Self Care | Payer: Self-pay | Source: Home / Self Care | Attending: Obstetrics and Gynecology

## 2024-05-06 ENCOUNTER — Ambulatory Visit: Payer: Self-pay | Admitting: Urgent Care

## 2024-05-06 ENCOUNTER — Ambulatory Visit
Admission: RE | Admit: 2024-05-06 | Discharge: 2024-05-06 | Disposition: A | Discharge status: Home / Self Care | Attending: Obstetrics and Gynecology | Admitting: Obstetrics and Gynecology

## 2024-05-06 ENCOUNTER — Other Ambulatory Visit: Payer: Self-pay

## 2024-05-06 ENCOUNTER — Encounter: Payer: Self-pay | Admitting: Obstetrics and Gynecology

## 2024-05-06 DIAGNOSIS — R9389 Abnormal findings on diagnostic imaging of other specified body structures: Secondary | ICD-10-CM | POA: Insufficient documentation

## 2024-05-06 DIAGNOSIS — E7800 Pure hypercholesterolemia, unspecified: Secondary | ICD-10-CM | POA: Insufficient documentation

## 2024-05-06 DIAGNOSIS — Z79899 Other long term (current) drug therapy: Secondary | ICD-10-CM | POA: Insufficient documentation

## 2024-05-06 DIAGNOSIS — N882 Stricture and stenosis of cervix uteri: Secondary | ICD-10-CM | POA: Insufficient documentation

## 2024-05-06 DIAGNOSIS — I421 Obstructive hypertrophic cardiomyopathy: Secondary | ICD-10-CM | POA: Insufficient documentation

## 2024-05-06 DIAGNOSIS — I2510 Atherosclerotic heart disease of native coronary artery without angina pectoris: Secondary | ICD-10-CM | POA: Insufficient documentation

## 2024-05-06 DIAGNOSIS — I341 Nonrheumatic mitral (valve) prolapse: Secondary | ICD-10-CM | POA: Diagnosis not present

## 2024-05-06 DIAGNOSIS — F419 Anxiety disorder, unspecified: Secondary | ICD-10-CM | POA: Diagnosis not present

## 2024-05-06 DIAGNOSIS — I110 Hypertensive heart disease with heart failure: Secondary | ICD-10-CM | POA: Insufficient documentation

## 2024-05-06 DIAGNOSIS — Z853 Personal history of malignant neoplasm of breast: Secondary | ICD-10-CM | POA: Insufficient documentation

## 2024-05-06 DIAGNOSIS — Z79810 Long term (current) use of selective estrogen receptor modulators (SERMs): Secondary | ICD-10-CM | POA: Insufficient documentation

## 2024-05-06 DIAGNOSIS — Z7901 Long term (current) use of anticoagulants: Secondary | ICD-10-CM | POA: Insufficient documentation

## 2024-05-06 DIAGNOSIS — F418 Other specified anxiety disorders: Secondary | ICD-10-CM | POA: Diagnosis not present

## 2024-05-06 DIAGNOSIS — I509 Heart failure, unspecified: Secondary | ICD-10-CM | POA: Diagnosis not present

## 2024-05-06 DIAGNOSIS — F32A Depression, unspecified: Secondary | ICD-10-CM | POA: Diagnosis not present

## 2024-05-06 DIAGNOSIS — N840 Polyp of corpus uteri: Secondary | ICD-10-CM | POA: Diagnosis not present

## 2024-05-06 DIAGNOSIS — N858 Other specified noninflammatory disorders of uterus: Secondary | ICD-10-CM | POA: Diagnosis not present

## 2024-05-06 DIAGNOSIS — I25118 Atherosclerotic heart disease of native coronary artery with other forms of angina pectoris: Secondary | ICD-10-CM | POA: Diagnosis not present

## 2024-05-06 HISTORY — DX: Long term (current) use of aspirin: Z79.82

## 2024-05-06 HISTORY — DX: Other ventricular tachycardia: I47.29

## 2024-05-06 HISTORY — DX: Other ill-defined heart diseases: I51.89

## 2024-05-06 HISTORY — PX: HYSTEROSCOPY WITH D & C: SHX1775

## 2024-05-06 HISTORY — DX: Solitary pulmonary nodule: R91.1

## 2024-05-06 HISTORY — DX: Anxiety disorder, unspecified: F41.9

## 2024-05-06 HISTORY — DX: Supraventricular tachycardia, unspecified: I47.10

## 2024-05-06 HISTORY — DX: Nonrheumatic mitral (valve) prolapse: I34.1

## 2024-05-06 SURGERY — DILATATION AND CURETTAGE /HYSTEROSCOPY
Anesthesia: General | Site: Uterus

## 2024-05-06 MED ORDER — LIDOCAINE HCL (PF) 2 % IJ SOLN
INTRAMUSCULAR | Status: AC
  Filled 2024-05-06: qty 5

## 2024-05-06 MED ORDER — SILVER NITRATE-POT NITRATE 75-25 % EX MISC
CUTANEOUS | Status: AC
  Filled 2024-05-06: qty 10

## 2024-05-06 MED ORDER — MIDAZOLAM HCL 2 MG/2ML IJ SOLN
INTRAMUSCULAR | Status: AC
  Filled 2024-05-06: qty 2

## 2024-05-06 MED ORDER — MIDAZOLAM HCL (PF) 2 MG/2ML IJ SOLN
INTRAMUSCULAR | Status: DC | PRN
  Administered 2024-05-06: 2 mg via INTRAVENOUS

## 2024-05-06 MED ORDER — FENTANYL CITRATE (PF) 100 MCG/2ML IJ SOLN: 25.0000 ug | INTRAMUSCULAR | Status: DC | PRN

## 2024-05-06 MED ORDER — PHENYLEPHRINE 80 MCG/ML (10ML) SYRINGE FOR IV PUSH (FOR BLOOD PRESSURE SUPPORT)
PREFILLED_SYRINGE | INTRAVENOUS | Status: DC | PRN
  Administered 2024-05-06 (×6): 80 ug via INTRAVENOUS

## 2024-05-06 MED ORDER — PROPOFOL 10 MG/ML IV BOLUS
INTRAVENOUS | Status: DC | PRN
  Administered 2024-05-06: 150 mg via INTRAVENOUS

## 2024-05-06 MED ORDER — LIDOCAINE HCL (CARDIAC) PF 100 MG/5ML IV SOSY
PREFILLED_SYRINGE | INTRAVENOUS | Status: DC | PRN
  Administered 2024-05-06: 80 mg via INTRAVENOUS

## 2024-05-06 MED ORDER — LACTATED RINGERS IV SOLN: INTRAVENOUS | Status: DC | PRN

## 2024-05-06 MED ORDER — FENTANYL CITRATE (PF) 100 MCG/2ML IJ SOLN
INTRAMUSCULAR | Status: DC | PRN
  Administered 2024-05-06 (×2): 25 ug via INTRAVENOUS

## 2024-05-06 MED ORDER — ONDANSETRON HCL 4 MG/2ML IJ SOLN
INTRAMUSCULAR | Status: DC | PRN
  Administered 2024-05-06: 4 mg via INTRAVENOUS

## 2024-05-06 MED ORDER — OXYCODONE HCL 5 MG/5ML PO SOLN: 5.0000 mg | Freq: Once | ORAL | Status: AC | PRN

## 2024-05-06 MED ORDER — LACTATED RINGERS IV SOLN: INTRAVENOUS | Status: AC

## 2024-05-06 MED ORDER — ACETAMINOPHEN 10 MG/ML IV SOLN
INTRAVENOUS | Status: AC
  Filled 2024-05-06: qty 100

## 2024-05-06 MED ORDER — IBUPROFEN 600 MG PO TABS: 600.0000 mg | ORAL_TABLET | Freq: Four times a day (QID) | ORAL | 0 refills | Status: AC

## 2024-05-06 MED ORDER — CHLORHEXIDINE GLUCONATE 0.12 % MT SOLN
15.0000 mL | Freq: Once | OROMUCOSAL | Status: AC
  Administered 2024-05-06: 15 mL via OROMUCOSAL

## 2024-05-06 MED ORDER — ONDANSETRON HCL 4 MG/2ML IJ SOLN
INTRAMUSCULAR | Status: AC
  Filled 2024-05-06: qty 2

## 2024-05-06 MED ORDER — KETOROLAC TROMETHAMINE 30 MG/ML IJ SOLN
INTRAMUSCULAR | Status: DC | PRN
  Administered 2024-05-06: 15 mg via INTRAVENOUS

## 2024-05-06 MED ORDER — ORAL CARE MOUTH RINSE: 15.0000 mL | Freq: Once | OROMUCOSAL | Status: AC

## 2024-05-06 MED ORDER — ACETAMINOPHEN 10 MG/ML IV SOLN
INTRAVENOUS | Status: DC | PRN
  Administered 2024-05-06: 1000 mg via INTRAVENOUS

## 2024-05-06 MED ORDER — LACTATED RINGERS IV SOLN
INTRAVENOUS | Status: DC
Start: 2024-05-06 — End: 2024-05-06

## 2024-05-06 MED ORDER — PROPOFOL 10 MG/ML IV BOLUS
INTRAVENOUS | Status: AC
  Filled 2024-05-06: qty 20

## 2024-05-06 MED ORDER — FENTANYL CITRATE (PF) 100 MCG/2ML IJ SOLN
INTRAMUSCULAR | Status: AC
  Filled 2024-05-06: qty 2

## 2024-05-06 MED ORDER — 0.9 % SODIUM CHLORIDE (POUR BTL) OPTIME
TOPICAL | Status: DC | PRN
  Administered 2024-05-06: 500 mL

## 2024-05-06 MED ORDER — OXYCODONE HCL 5 MG PO TABS
ORAL_TABLET | ORAL | Status: AC
  Filled 2024-05-06: qty 1

## 2024-05-06 MED ORDER — DEXAMETHASONE SOD PHOSPHATE PF 10 MG/ML IJ SOLN
INTRAMUSCULAR | Status: DC | PRN
  Administered 2024-05-06: 8 mg via INTRAVENOUS

## 2024-05-06 MED ORDER — CHLORHEXIDINE GLUCONATE 0.12 % MT SOLN
OROMUCOSAL | Status: AC
  Filled 2024-05-06: qty 15

## 2024-05-06 MED ORDER — SILVER NITRATE-POT NITRATE 75-25 % EX MISC
CUTANEOUS | Status: DC | PRN
  Administered 2024-05-06: 2

## 2024-05-06 MED ORDER — KETOROLAC TROMETHAMINE 30 MG/ML IJ SOLN
INTRAMUSCULAR | Status: AC
  Filled 2024-05-06: qty 1

## 2024-05-06 MED ORDER — OXYCODONE HCL 5 MG PO TABS
5.0000 mg | ORAL_TABLET | Freq: Once | ORAL | Status: AC | PRN
  Administered 2024-05-06: 5 mg via ORAL

## 2024-05-06 SURGICAL SUPPLY — 25 items
BAG URINE DRAIN 2000ML AR STRL (UROLOGICAL SUPPLIES) IMPLANT
BASIN KIT SINGLE STR (MISCELLANEOUS) ×1 IMPLANT
CATH URTH 16FR FL 2W BLN LF (CATHETERS) IMPLANT
DEVICE MYOSURE LITE (MISCELLANEOUS) IMPLANT
DEVICE MYOSURE REACH (MISCELLANEOUS) IMPLANT
DRSG TELFA 3X8 NADH STRL (GAUZE/BANDAGES/DRESSINGS) IMPLANT
ELECTRODE REM PT RTRN 9FT ADLT (ELECTROSURGICAL) ×1 IMPLANT
GLOVE BIO SURGEON STRL SZ7 (GLOVE) ×1 IMPLANT
GLOVE BIOGEL PI IND STRL 7.5 (GLOVE) ×1 IMPLANT
GOWN STRL REUS W/ TWL LRG LVL3 (GOWN DISPOSABLE) ×2 IMPLANT
KIT PROCED FLUENT PRO FLT212S (KITS) ×1 IMPLANT
KIT TURNOVER CYSTO (KITS) ×1 IMPLANT
MANIFOLD NEPTUNE II (INSTRUMENTS) ×1 IMPLANT
NS IRRIG 500ML POUR BTL (IV SOLUTION) IMPLANT
PACK DNC HYST (MISCELLANEOUS) ×1 IMPLANT
PAD OB MATERNITY 11 LF (PERSONAL CARE ITEMS) ×1 IMPLANT
PAD PREP OB/GYN DISP 24X41 (PERSONAL CARE ITEMS) ×1 IMPLANT
SCRUB CHG 4% DYNA-HEX 4OZ (MISCELLANEOUS) ×1 IMPLANT
SEAL ROD LENS SCOPE MYOSURE (ABLATOR) ×1 IMPLANT
SET CYSTO IRRIGATION (SET/KITS/TRAYS/PACK) IMPLANT
SOL .9 NS 3000ML IRR UROMATIC (IV SOLUTION) ×1 IMPLANT
SURGILUBE 2OZ TUBE FLIPTOP (MISCELLANEOUS) ×1 IMPLANT
TRAP FLUID SMOKE EVACUATOR (MISCELLANEOUS) ×1 IMPLANT
TUBING CONNECTING 10 (TUBING) ×1 IMPLANT
WATER STERILE IRR 500ML POUR (IV SOLUTION) ×1 IMPLANT

## 2024-05-06 NOTE — Anesthesia Preprocedure Evaluation (Addendum)
 Anesthesia Evaluation  Patient identified by MRN, date of birth, ID band Patient awake    Reviewed: Allergy & Precautions, NPO status , Patient's Chart, lab work & pertinent test results  History of Anesthesia Complications Negative for: history of anesthetic complications  Airway Mallampati: III  TM Distance: >3 FB Neck ROM: full    Dental no notable dental hx. (+) Dental Advidsory Given   Pulmonary neg pulmonary ROS   Pulmonary exam normal        Cardiovascular hypertension, On Medications and On Home Beta Blockers (-) angina + CAD and +CHF  Normal cardiovascular exam+ dysrhythmias + Valvular Problems/Murmurs   HOCM  ECHO 2/24 IMPRESSIONS     1. Findings consistent with hypertrophic obstructive cardiomyopathy. Peak  velocity 0.95 m/s. Peak gradient. Compared with the echo 05/2022, outflow  obstruction has decreased. Left ventricular ejection fraction, by  estimation, is 65 to 70%. The left  ventricle has normal function. The left ventricle has no regional wall  motion abnormalities. There is moderate left ventricular hypertrophy. Left  ventricular diastolic parameters are consistent with Grade II diastolic  dysfunction (pseudonormalization).  Elevated left ventricular end-diastolic pressure.   2. Right ventricular systolic function is normal. The right ventricular  size is normal. There is normal pulmonary artery systolic pressure.   3. Systolic anterior motion (SAM) of the mitral valve. The mitral valve  is normal in structure. Mild mitral valve regurgitation. No evidence of  mitral stenosis.      Neuro/Psych  PSYCHIATRIC DISORDERS  Depression    negative neurological ROS     GI/Hepatic negative GI ROS, Neg liver ROS,,,  Endo/Other  negative endocrine ROS    Renal/GU      Musculoskeletal   Abdominal   Peds  Hematology negative hematology ROS (+)   Anesthesia Other Findings PMH of HOCM. Note per Dorise Pereyra:  Mary Walter is a 68 y.o. female who is submitted for pre-surgical anesthesia review and clearance prior to her undergoing the above procedure. Patient has never been a smoker in the past. Pertinent PMH includes: CAD, HOCM, MVP, diastolic dysfunction, NSVT, PSVT, palpitations, systolic murmur, IRBBB, angina, HTN, HLD, hyperparathyroidism, dyspnea, RLL pulmonary nodule, thrombocytopenia, breast cancer, endometrial thickening/uterine cervix stenosis, anxiety.    Patient is followed by cardiology Luiz, MD). She was last seen in the cardiology clinic on 05/04/2024; notes reviewed. At the time of her clinic visit, patient doing well overall from a cardiovascular perspective. Patient denied any chest pain, shortness of breath, PND, orthopnea, palpitations, significant peripheral edema, weakness, fatigue, vertiginous symptoms, or presyncope/syncope. Patient with a past medical history significant for cardiovascular diagnoses. Documented physical exam was grossly benign, providing no evidence of acute exacerbation and/or decompensation of the patient's known cardiovascular conditions.    Most recent myocardial perfusion imaging study was performed on 11/03/2015 revealing a normal left ventricular systolic function with an EF of 55-65%.  There were no regional wall motion abnormalities.  No artifact or left ventricular cavity size enlargement appreciated on review of imaging.  Blood pressure demonstrated a hypertensive response to exercise.  There were horizontal ST segment depression of 2 mm noted during stress in leads II, III, aVF, and V3-V6.  SPECT images demonstrated no evidence of stress-induced myocardial ischemia or arrhythmia; no scintigraphic evidence of scar.  TID ratio = 1.03 (normal range </= 1.2). Study determined to be normal and low risk.    Coronary CTA was performed on 01/14/2022 that demonstrated an Agatston coronary artery calcium  score of 216. This placed  patient in the  87th percentile for age, sex, and race matched controls. Calcium  depositions noted to be isolated mainly in the proximal LAD (25-49%) and ostial RCA (<25%) distributions.  Study demonstrated normal coronary origin with RIGHT dominance.      Cardiac MRI was performed on 01/30/2022 revealing a normal left ventricular systolic function with an EF of 73%.  There was severe asymmetric LV basal septal hypertrophy, with basal septal segment measuring up to 1.6 cm.  Transmural LGE/scar present in the left ventricular basal segment.  Systolic anterior motion (SAM) of the mitral valve causing LVOT obstruction and mild mitral valve regurgitation was noted.  Findings consistent with hypertrophic obstructive cardiomyopathy (HOCM).    Long-term cardiac event monitor study performed on 02/07/2022 revealed a predominant underlying sinus rhythm with a bundle branch block at an average rate of 72 bpm; range 52-182 bpm.  There were 3 runs of NSVT with the fastest interval lasting 4 beats at a maximum rate of 152, and the longest lasting 5 beats at an average rate of 122 bpm.  10 runs of PSVT occurred with the fastest lasting 4 beats at a max rate of 182, and the longest lasting 15.1 seconds and an average rate of 103 bpm.  Rare atrial and ventricular ectopy was observed.  There were no sustained arrhythmias or prolonged pauses.  Patient triggered events all associated with rare PACs.    Most recent stress echocardiogram was performed on 04/09/2024 revealing a normal left ventricular systolic function with an EF of 60%.  There was mild central mitral valve regurgitation related to an anterior leaflet prolapse.  Resting LVOT gradient was 14 mmHg; Valsalva gradient 17 mmHg with prominent MR Doppler artifact.  Squat to stand LVOT gradient of 17 at the greatest.  Most exercise LVOT gradient, at best estimate was 33 mmHg with prominent MR Doppler artifact.  Mitral valve regurgitation largely unchanged throughout provocation.   There were no exercise-induced arrhythmias, hypertension, or syncopal episodes.  Overall, there was a slight increase in LVOT gradient from resting imaging, however improved from prior stress testing.   HOCM being managed on mavacamten . Blood pressure well controlled at 115/70 mmHg on currently prescribed diuretic (furosemide ) and beta-blocker (metoprolol  succinate) therapies.  Patient is on atorvastatin  for her HLD diagnosis and ASCVD prevention. Patient is not diabetic. She does not have an OSAH diagnosis. Patient is able to complete all of her  ADL/IADLs without cardiovascular limitation.  Per the [facility], patient is able to achieve at least 4 METS of physical activity without experiencing any significant degree of angina/anginal equivalent symptoms. No changes were made to her medication regimen during her visit with cardiology.  Patient scheduled to follow-up with outpatient cardiology in 6 months or sooner if needed.   Mary Walter is scheduled for an elective DILATATION AND CURETTAGE /HYSTEROSCOPY (Cervix) on 05/06/2024 with Dr. Garnette JONETTA Mace, MD. Given patient's past medical history significant for cardiovascular diagnoses, presurgical cardiac clearance was sought by the PAT team. Per cardiology, METs greater than 4. RCRI = 1.1% risk of MACE. Patient is overall stable from a cardiac perspective.  EKG is stable today.  Recent stress echo was reassuring. She is euvolemic on exam.  Patient is able to walk 1-2 blocks and up a flight of stairs.  Avoid dehydration, hypotension, tachycardia during procedure. Ok to proceed without any further cardiac work-up.   In review of the patient's medication reconciliation, it is noted that she is on daily oral antithrombotic therapy. Given that patient's past  medical history is significant for cardiovascular diagnoses, including but not limited to CAD, OB/GYN has cleared patient to continue her daily low dose ASA throughout her perioperative course.  Patient  has been updated on these directives from her specialty care providers by the PAT team.   Past Medical History: No date: Breast cancer (HCC) No date: Chest pain No date: Dyspnea No date: Family history of adverse reaction to anesthesia     Comment:  SON-LUNGS FILLED UP WITH FLUID AFTER APPENDECTOMY No date: Hematuria No date: HOCM (hypertrophic obstructive cardiomyopathy) (HCC)     Comment:  a. 10/2019 Echo: EF 70-75%, mod LVH. No rwma. Mild SAM,               mild LVOT obs(peak grad 20 @ rest 26 w/ Valsalva); b.               01/2022 cMRI: EF 73%, sev asymm LV basal septal               hypertrophy- basal segment 1.6cm. Transmural LGE/scar in               LV basal segment (point of max thickness & RV insertion).              SAM->mild MR & LVOT obs-->HOCM. No date: Hypercholesterolemia No date: Hypertension No date: Nonobstructive CAD (coronary artery disease)     Comment:  a.10/2015 MV: No ischemia/infarct; b. 11/2019 Cor CTA:               mild nonobs LAD dzs; c. 12/2021 Cor CTA: Ca2+ = 216               (87th%'ile). LM nl, LAD 25-49p, LCX nondom, nl, RCA               <25ost. No date: Palpitations     Comment:  a. 12/2021 Zio: RSR @ 72 (52-182). 3 runs NSVT (longest 5              beats, fastest 152 x 4 beats). 10 SVT runs (longest 15.1               secs @ 103, fastest 4 beats @ 182). Rare PACs/PVCs. No date: Personal history of radiation therapy No date: Pulmonary nodule     Comment:  a. 12/2021 CT chest: stable 4mm RLL pulm nodule, likely               benign, No f/u imaging req. No date: Systolic murmur  Past Surgical History: 10/27/2019: BREAST BIOPSY; Left     Comment:  Affirm bx-distortion-     clip-path ADH 01/07/2020: BREAST BIOPSY; Left     Comment:  Affim bx 1 area/ x clip and ribbon clip/ ADH No date: BREAST LUMPECTOMY 02/14/2020: BREAST LUMPECTOMY WITH NEEDLE LOCALIZATION; Left     Comment:  Procedure: BREAST LUMPECTOMY WITH NEEDLE LOCALIZATION;                 Surgeon: Dessa Reyes ORN, MD;  Location: [facility] ORS;                Service: General;  Laterality: Left; 10/2020: CHOLECYSTECTOMY No date: FACIAL FRACTURE SURGERY     Comment:  X 2     Reproductive/Obstetrics negative OB ROS                              Anesthesia Physical Anesthesia Plan  ASA:  3  Anesthesia Plan: General   Post-op Pain Management:    Induction: Intravenous  PONV Risk Score and Plan: 3 and Ondansetron , Dexamethasone  and Midazolam   Airway Management Planned: LMA  Additional Equipment:   Intra-op Plan:   Post-operative Plan: Extubation in OR  Informed Consent: I have reviewed the patients History and Physical, chart, labs and discussed the procedure including the risks, benefits and alternatives for the proposed anesthesia with the patient or authorized representative who has indicated his/her understanding and acceptance.     Dental Advisory Given  Plan Discussed with: Anesthesiologist, CRNA and Surgeon  Anesthesia Plan Comments: (Patient consented for risks of anesthesia including but not limited to:  - adverse reactions to medications - damage to eyes, teeth, lips or other oral mucosa - nerve damage due to positioning  - sore throat or hoarseness - Damage to heart, brain, nerves, lungs, other parts of body or loss of life  Patient voiced understanding and assent.)        Anesthesia Quick Evaluation

## 2024-05-06 NOTE — Addendum Note (Signed)
 Addendum  created 05/06/24 1611 by Ascher Schroepfer, Fairy POUR, MD   Intraprocedure Staff edited

## 2024-05-06 NOTE — Op Note (Signed)
 Operative Note   Name: Mary Walter  Date of Service: 05/06/2024  DOB: 04/25/1956  MRN: [id number]   PRE-OP DIAGNOSIS:  1) Endometrial thickening 2) Cervical stenosis 3) Use of tamoxifen   POST-OP DIAGNOSIS:  1) Endometrial thickening 2) Polypoid lesion of endometrium 3) Cervical stenosis 4) Use of tamoxifen   SURGEON: Surgeons and Role:    DEWAINE Leonce Garnette JONETTA, MD - Primary  PROCEDURE: Procedure(s): DILATATION AND CURETTAGE /HYSTEROSCOPY, ENDOMETRIAL POLYPECTOMY  ANESTHESIA: General   ESTIMATED BLOOD LOSS: 5 mL  DRAINS: none   TOTAL IV FLUIDS: 700 mL  SPECIMENS:  1) Endometrial polypoid lesions 2) Endometrial curettings  VTE PROPHYLAXIS: SCDs to the bilateral lower extremities  ANTIBIOTICS: none indicated  FLUID DEFICIT: 70 mL  COMPLICATIONS: none  DISPOSITION: PACU - hemodynamically stable.  CONDITION: stable  INDICATION: 68 y.o. female who is on tamoxifen therapy with an increasingly thick endometrium who had a lining that was thinning. She had cervical stenosis and could not be sampled in office.   FINDINGS: Exam under anesthesia revealed small, mobile anteverted uterus with no masses and bilateral adnexa without masses or fullness. Hysteroscopy revealed left antero-lateral and left lateral polypoid lesions in the uterine cavity with bilateral tubal ostia and normal appearing endocervical canal.  PROCEDURE IN DETAIL:  After informed consent was obtained, the patient was taken to the operating room where anesthesia was obtained without difficulty. The patient was positioned in the dorsal lithotomy position in [place] stirrups.  The patient's bladder was catheterized with an in and out foley catheter.  The patient was examined under anesthesia, with the above noted findings.  The bi-valved speculum was placed inside the patient's vagina, and the the anterior lip of the cervix was grasped with the tenaculum.  Then the cervix was progressively dilated to a 7 mm  Hegar dilator.  The hysteroscope was introduced, with the above noted findings. The MyoSure Reach device was utilized to remove the polypoid lesions.  The hystersocope was removed and the uterine cavity was curetted until a gritty texture was noted, yielding small endometrial curettings.  Excellent hemostasis was noted. The tenaculum was removed and hemostasis was obtained using silver nitrate.  After verifying no instruments or sponges remained the speculum was removed. [name]  She was then taken out of dorsal lithotomy. The patient tolerated the procedure well.  Sponge, lap and needle counts were correct x2.  The patient was taken to recovery room in excellent condition.  Garnette CHARM Leonce, MD, FACOG 05/06/2024 3:00 PM

## 2024-05-06 NOTE — Anesthesia Postprocedure Evaluation (Signed)
 Anesthesia Post Note  Patient: Mary Walter  Procedure(s) Performed: DILATATION AND CURETTAGE /HYSTEROSCOPY (Uterus)  Patient location during evaluation: PACU Anesthesia Type: General Level of consciousness: awake and alert Pain management: pain level controlled Vital Signs Assessment: post-procedure vital signs reviewed and stable Respiratory status: spontaneous breathing, nonlabored ventilation and respiratory function stable Cardiovascular status: blood pressure returned to baseline and stable Postop Assessment: no apparent nausea or vomiting Anesthetic complications: no   No notable events documented.   Last Vitals:  Vitals:   05/06/24 1545 05/06/24 1600  BP: 133/69 136/66  Pulse: 75 67  Resp: 19 12  Temp:  36.7 C  SpO2: 97% 97%    Last Pain:  Vitals:   05/06/24 1600  TempSrc:   PainSc: 3                  Fairy POUR Arine Foley

## 2024-05-06 NOTE — Anesthesia Procedure Notes (Signed)
 Procedure Name: LMA Insertion Date/Time: 05/06/2024 2:30 PM  Performed by: Dyane Mass, CRNAPre-anesthesia Checklist: Patient identified, Emergency Drugs available, Suction available and Patient being monitored Patient Re-evaluated:Patient Re-evaluated prior to induction Oxygen Delivery Method: Circle system utilized Preoxygenation: Pre-oxygenation with 100% oxygen Induction Type: IV induction LMA: LMA inserted LMA Size: 4.0 Tube type: Oral Number of attempts: 1 Placement Confirmation: positive ETCO2 Tube secured with: Tape Dental Injury: Teeth and Oropharynx as per pre-operative assessment

## 2024-05-06 NOTE — Transfer of Care (Signed)
 Immediate Anesthesia Transfer of Care Note  Patient: Mary Walter  Procedure(s) Performed: DILATATION AND CURETTAGE /HYSTEROSCOPY (Uterus)  Patient Location: PACU  Anesthesia Type:General  Level of Consciousness: drowsy  Airway & Oxygen Therapy: Patient Spontanous Breathing and Patient connected to face mask oxygen  Post-op Assessment: Report given to RN and Post -op Vital signs reviewed and stable  Post vital signs: Reviewed and stable  Last Vitals:  Vitals Value Taken Time  BP 116/71 05/06/24 15:10  Temp    Pulse 68 05/06/24 15:12  Resp 18 05/06/24 15:12  SpO2 100 % 05/06/24 15:12  Vitals shown include unfiled device data.  Last Pain:  Vitals:   05/06/24 0938  TempSrc: Temporal  PainSc: 0-No pain         Complications: No notable events documented.

## 2024-05-06 NOTE — Interval H&P Note (Signed)
 History and Physical Interval Note:  05/06/2024 12:52 PM  Mary Walter  has presented today for surgery, with the diagnosis of endometrial thickening on ultrasound, cervical stenosis.  The various methods of treatment have been discussed with the patient and family. After consideration of risks, benefits and other options for treatment, the patient has consented to  Procedure(s) with comments: DILATATION AND CURETTAGE /HYSTEROSCOPY (N/A) - REMOVAL OF ABNORMAL TISSUE as a surgical intervention.  The patient's history has been reviewed, patient examined, no change in status, stable for surgery.  I have reviewed the patient's chart and labs.  Questions were answered to the patient's satisfaction.    Garnette Mace, MD, [facility] 05/06/2024 12:52 PM

## 2024-05-07 ENCOUNTER — Encounter: Payer: Self-pay | Admitting: Obstetrics and Gynecology

## 2024-05-10 LAB — SURGICAL PATHOLOGY

## 2024-05-11 ENCOUNTER — Ambulatory Visit: Payer: Self-pay | Admitting: Obstetrics and Gynecology

## 2024-06-04 ENCOUNTER — Other Ambulatory Visit (HOSPITAL_COMMUNITY)

## 2024-06-15 ENCOUNTER — Other Ambulatory Visit (HOSPITAL_COMMUNITY)

## 2024-06-25 ENCOUNTER — Telehealth: Payer: Self-pay | Admitting: Pharmacy Technician

## 2024-06-25 ENCOUNTER — Other Ambulatory Visit (HOSPITAL_COMMUNITY): Payer: Self-pay

## 2024-06-25 NOTE — Telephone Encounter (Signed)
" ° °  New insurance now. [employer]- per test claim- 2,100 cost filled on transition fill. Cancelled this pa and started pa on [employer].  Pharmacy Patient Advocate Encounter   Received notification from Onbase that prior authorization for camzyos  is required/requested.   Insurance verification completed.   The patient is insured through [facility] [employer].   Per test claim: PA required; PA submitted to above mentioned insurance via Latent Key/confirmation #/EOC [id number] Status is pending  "

## 2024-06-28 NOTE — Telephone Encounter (Signed)
 Pharmacy Patient Advocate Encounter  Received notification from [employer] that Prior Authorization for camzyos  5mg  has been DENIED.  Full denial letter will be uploaded to the media tab. See denial reason below.   The patient has new insurance now. She had NYHA Class II when the Camzyos  was started but her last notes say she is now a class I. The prior authorization only asked what the diagnosis code was and if she has tried and failed at least one formulary alternative. They must of just read the ov notes. Can someone help with the appeal letter so we can appeal this denial? Thank you!

## 2024-06-29 ENCOUNTER — Encounter: Payer: Self-pay | Admitting: Surgery

## 2024-06-29 DIAGNOSIS — Z1231 Encounter for screening mammogram for malignant neoplasm of breast: Secondary | ICD-10-CM

## 2024-06-29 NOTE — Telephone Encounter (Signed)
 Appeals letter faxed.

## 2024-06-30 ENCOUNTER — Other Ambulatory Visit (INDEPENDENT_AMBULATORY_CARE_PROVIDER_SITE_OTHER)

## 2024-06-30 ENCOUNTER — Other Ambulatory Visit: Payer: Self-pay | Admitting: Surgery

## 2024-06-30 DIAGNOSIS — R319 Hematuria, unspecified: Secondary | ICD-10-CM | POA: Diagnosis not present

## 2024-06-30 DIAGNOSIS — E782 Mixed hyperlipidemia: Secondary | ICD-10-CM | POA: Diagnosis not present

## 2024-06-30 DIAGNOSIS — Z853 Personal history of malignant neoplasm of breast: Secondary | ICD-10-CM

## 2024-06-30 DIAGNOSIS — I10 Essential (primary) hypertension: Secondary | ICD-10-CM

## 2024-06-30 LAB — URINALYSIS, ROUTINE W REFLEX MICROSCOPIC
Bilirubin Urine: NEGATIVE
Ketones, ur: NEGATIVE
Leukocytes,Ua: NEGATIVE
Nitrite: NEGATIVE
Specific Gravity, Urine: 1.015 (ref 1.000–1.030)
Total Protein, Urine: NEGATIVE
Urine Glucose: NEGATIVE
Urobilinogen, UA: 0.2 (ref 0.0–1.0)
pH: 5.5 (ref 5.0–8.0)

## 2024-06-30 LAB — BASIC METABOLIC PANEL WITH GFR
BUN: 17 mg/dL (ref 6–23)
CO2: 25 meq/L (ref 19–32)
Calcium: 10.3 mg/dL (ref 8.4–10.5)
Chloride: 109 meq/L (ref 96–112)
Creatinine, Ser: 0.85 mg/dL (ref 0.40–1.20)
GFR: 69.81 mL/min
Glucose, Bld: 119 mg/dL — ABNORMAL HIGH (ref 70–99)
Potassium: 3.7 meq/L (ref 3.5–5.1)
Sodium: 141 meq/L (ref 135–145)

## 2024-06-30 LAB — HEPATIC FUNCTION PANEL
ALT: 26 U/L (ref 3–35)
AST: 22 U/L (ref 5–37)
Albumin: 3.9 g/dL (ref 3.5–5.2)
Alkaline Phosphatase: 77 U/L (ref 39–117)
Bilirubin, Direct: 0.2 mg/dL (ref 0.1–0.3)
Total Bilirubin: 0.8 mg/dL (ref 0.2–1.2)
Total Protein: 6.1 g/dL (ref 6.0–8.3)

## 2024-06-30 LAB — LIPID PANEL
Cholesterol: 102 mg/dL (ref 28–200)
HDL: 32 mg/dL — ABNORMAL LOW
LDL Cholesterol: 37 mg/dL (ref 10–99)
NonHDL: 70.28
Total CHOL/HDL Ratio: 3
Triglycerides: 167 mg/dL — ABNORMAL HIGH (ref 10.0–149.0)
VLDL: 33.4 mg/dL (ref 0.0–40.0)

## 2024-06-30 NOTE — Telephone Encounter (Signed)
 Lauraine Hun from [employer] called and LVM for me asking for most recent echo. Stress echo from Oct and echo from June faxed.

## 2024-07-01 ENCOUNTER — Ambulatory Visit: Payer: Self-pay | Admitting: Internal Medicine

## 2024-07-02 ENCOUNTER — Encounter: Payer: Self-pay | Admitting: Internal Medicine

## 2024-07-02 ENCOUNTER — Ambulatory Visit: Admitting: Internal Medicine

## 2024-07-02 VITALS — BP 116/70 | HR 69 | Temp 98.3°F | Ht 65.0 in | Wt 179.2 lb

## 2024-07-02 DIAGNOSIS — I421 Obstructive hypertrophic cardiomyopathy: Secondary | ICD-10-CM | POA: Diagnosis not present

## 2024-07-02 DIAGNOSIS — R9389 Abnormal findings on diagnostic imaging of other specified body structures: Secondary | ICD-10-CM | POA: Diagnosis not present

## 2024-07-02 DIAGNOSIS — E782 Mixed hyperlipidemia: Secondary | ICD-10-CM

## 2024-07-02 DIAGNOSIS — D0512 Intraductal carcinoma in situ of left breast: Secondary | ICD-10-CM | POA: Diagnosis not present

## 2024-07-02 DIAGNOSIS — E210 Primary hyperparathyroidism: Secondary | ICD-10-CM | POA: Diagnosis not present

## 2024-07-02 DIAGNOSIS — F439 Reaction to severe stress, unspecified: Secondary | ICD-10-CM | POA: Diagnosis not present

## 2024-07-02 DIAGNOSIS — I10 Essential (primary) hypertension: Secondary | ICD-10-CM

## 2024-07-02 DIAGNOSIS — I25118 Atherosclerotic heart disease of native coronary artery with other forms of angina pectoris: Secondary | ICD-10-CM

## 2024-07-02 DIAGNOSIS — R739 Hyperglycemia, unspecified: Secondary | ICD-10-CM

## 2024-07-02 DIAGNOSIS — R319 Hematuria, unspecified: Secondary | ICD-10-CM

## 2024-07-02 NOTE — Progress Notes (Signed)
 "  Subjective:    [name], female    DOB: 09-10-1955, 69 y.o.   MRN: [id number]  Patient here for No chief complaint on file.   HPI Here for a scheduled follow up. Is s/p hysteroscopy, dilation and curettage on 05/06/24. Had f/u with gyn 05/24/24 - stable. Had f/u with cardiology 03/15/24 - f/u obstructive hypertrophic cardiomyopathy with increased LVOT - continue mavacamten , stress echo. Evaluated by endocrinology for hypercalcemia. Labs are c/w primary hyperparathyroidism. Continuing to monitor. Recommended f/u bone density 07/2024. Had f/u with Dr Tye 08/26/23 - f/u breast cancer s/p left lumpectomy 2021. Continues on tamoxifen. Recommended f/u colonoscopy 2028. S/p breast biopsy 09/01/23 - ok. Recommended annual screening mammogram - due 07/2024. Increased stress. Husband - alcoholic. Discussed support - number given for Alanon.    Past Medical History:  Diagnosis Date   Anxiety    a.) Tx'd with SSRI   Breast cancer (HCC)    Cervical stenosis (uterine cervix)    Chest pain    Diastolic dysfunction    Dyspnea    Endometrial thickening on ultrasound    Family history of adverse reaction to anesthesia    SON-LUNGS FILLED UP WITH FLUID AFTER APPENDECTOMY   Hematuria    HOCM (hypertrophic obstructive cardiomyopathy) (HCC)    a. 10/2019 Echo: EF 70-75%, mod LVH. No rwma. Mild SAM, mild LVOT obs(peak grad 20 @ rest 26 w/ Valsalva); b. 01/2022 cMRI: EF 73%, sev asymm LV basal septal hypertrophy- basal segment 1.6cm. Transmural LGE/scar in LV basal segment (point of max thickness & RV insertion). SAM->mild MR & LVOT obs-->HOCM.   Hypercholesterolemia    Hypertension    Incomplete right bundle branch block (RBBB)    Long-term use of aspirin  therapy    MVP (mitral valve prolapse); anterior valve leaflet    Nonobstructive CAD (coronary artery disease)    a.10/2015 MV: No ischemia/infarct; b. 11/2019 Cor CTA: mild nonobs LAD dzs; c. 12/2021 Cor CTA: Ca2+ = 216 (87th%'ile). LM nl,  LAD 25-49p, LCX nondom, nl, RCA <25ost.   NSVT (nonsustained ventricular tachycardia) (HCC)    Palpitations    a. 12/2021 Zio: RSR @ 72 (52-182). 3 runs NSVT (longest 5 beats, fastest 152 x 4 beats). 10 SVT runs (longest 15.1 secs @ 103, fastest 4 beats @ 182). Rare PACs/PVCs.   Personal history of radiation therapy    Primary hyperparathyroidism    PSVT (paroxysmal supraventricular tachycardia)    Right lower lobe pulmonary nodule    a. 12/2021 CT chest: stable 4mm RLL pulm nodule, likely benign, No f/u imaging req.   Systolic murmur    Thrombocytopenia    Past Surgical History:  Procedure Laterality Date   BREAST BIOPSY Left 10/27/2019   Affirm bx-distortion-     clip-path ADH   BREAST BIOPSY Left 01/07/2020   Affim bx 1 area/ x clip and ribbon clip/ ADH   BREAST BIOPSY Left 09/01/2023   MM LT BREAST BX W LOC DEV 1ST LESION IMAGE BX SPEC STEREO GUIDE 09/01/2023 [facility]   BREAST LUMPECTOMY     BREAST LUMPECTOMY WITH NEEDLE LOCALIZATION Left 02/14/2020   Procedure: BREAST LUMPECTOMY WITH NEEDLE LOCALIZATION;  Surgeon: Dessa Reyes ORN, MD;  Location: [facility];  Service: General;  Laterality: Left;   CHOLECYSTECTOMY  10/2020   COLONOSCOPY WITH PROPOFOL  N/A 10/18/2022   Procedure: COLONOSCOPY WITH PROPOFOL ;  Surgeon: Maryruth Ole DASEN, MD;  Location: [facility];  Service: Endoscopy;  Laterality: N/A;   FACIAL FRACTURE SURGERY  X 2   HYSTEROSCOPY WITH D & C N/A 05/06/2024   Procedure: DILATATION AND CURETTAGE /HYSTEROSCOPY;  Surgeon: Leonce Garnette BIRCH, MD;  Location: [facility];  Service: Gynecology;  Laterality: N/A;  REMOVAL OF ABNORMAL TISSUE; POLYPECTOMY   Family History  Problem Relation Age of Onset   Diabetes Mother    Heart disease Mother    Hypertension Mother    Cancer Father        prostate    Diverticulitis Father    CVA Father    Heart disease Father    Stroke Father    Hypertension Father    Heart murmur Father    Breast cancer Sister    Heart  attack Brother    CVA Paternal Grandmother    Breast cancer Cousin    Social History   Socioeconomic History   Marital status: Married    Spouse name: Not on file   Number of children: 2   Years of education: Not on file   Highest education level: Not on file  Occupational History   Not on file  Tobacco Use   Smoking status: Never   Smokeless tobacco: Never  Vaping Use   Vaping status: Never Used  Substance and Sexual Activity   Alcohol use: No    Alcohol/week: 0.0 standard drinks of alcohol   Drug use: No   Sexual activity: Not Currently  Other Topics Concern   Not on file  Social History Narrative   Married   Works Part time   Social Drivers of Health   Tobacco Use: Low Risk (07/10/2024)   Patient History    Smoking Tobacco Use: Never    Smokeless Tobacco Use: Never    Passive Exposure: Not on file  Financial Resource Strain: Low Risk  (09/08/2023)   Received [facility]   Overall Financial Resource Strain (CARDIA)    Difficulty of Paying Living Expenses: Not very hard  Food Insecurity: No Food Insecurity (09/08/2023)   Received from [facility]   Epic    Within the past 12 months, you worried that your food would run out before you got the money to buy more.: Never true    Within the past 12 months, the food you bought just didn't last and you didn't have money to get more.: Never true  Transportation Needs: No Transportation Needs (09/08/2023)   Received from [facility] - Transportation    In the past 12 months, has lack of transportation kept you from medical appointments or from getting medications?: No    Lack of Transportation (Non-Medical): No  Physical Activity: Inactive (02/07/2023)   Exercise Vital Sign    Days of Exercise per Week: 0 days    Minutes of Exercise per Session: 0 min  Stress: No Stress Concern Present (02/07/2023)   [employer] - Occupational  Stress Questionnaire    Feeling of Stress : Only a little  Social Connections: Socially Integrated (02/07/2023)   Social Connection and Isolation Panel    Frequency of Communication with Friends and Family: More than three times a week    Frequency of Social Gatherings with Friends and Family: More than three times a week    Attends Religious Services: More than 4 times per year    Active Member of Clubs or Organizations: Yes    Attends Banker Meetings: Never    Marital Status: Married  Depression (PHQ2-9): Low Risk (07/02/2024)  Depression (PHQ2-9)    PHQ-2 Score: 0  Alcohol Screen: Low Risk (02/07/2023)   Alcohol Screen    Last Alcohol Screening Score (AUDIT): 0  Housing: Low Risk  (09/08/2023)   Received from [facility]   Epic    In the last 12 months, was there a time when you were not able to pay the mortgage or rent on time?: No    In the past 12 months, how many times have you moved where you were living?: 0    At any time in the past 12 months, were you homeless or living in a shelter (including now)?: No  Utilities: Not At Risk (09/08/2023)   Received from [facility]    Threatened with loss of utilities: No  Health Literacy: Not on file     Review of Systems  Constitutional:  Negative for appetite change and unexpected weight change.  HENT:  Negative for congestion and sinus pressure.   Respiratory:  Negative for cough, chest tightness and shortness of breath.   Cardiovascular:  Negative for chest pain, palpitations and leg swelling.  Gastrointestinal:  Negative for abdominal pain, diarrhea, nausea and vomiting.  Genitourinary:  Negative for difficulty urinating and dysuria.  Musculoskeletal:  Negative for joint swelling and myalgias.  Skin:  Negative for color change and rash.  Neurological:  Negative for dizziness and headaches.  Psychiatric/Behavioral:  Negative for agitation.        Increased stress  as outlined.        Objective:     BP 116/70   Pulse 69   Temp 98.3 F (36.8 C) (Oral)   Ht 5' 5 (1.651 m)   Wt 179 lb 3.2 oz (81.3 kg)   LMP 06/24/2010   SpO2 97%   BMI 29.82 kg/m  Wt Readings from Last 3 Encounters:  07/02/24 179 lb 3.2 oz (81.3 kg)  05/04/24 183 lb (83 kg)  04/29/24 182 lb (82.6 kg)    Physical Exam Vitals reviewed.  Constitutional:      General: She is not in acute distress.    Appearance: Normal appearance.  HENT:     Head: Normocephalic and atraumatic.     Right Ear: External ear normal.     Left Ear: External ear normal.     Mouth/Throat:     Pharynx: No oropharyngeal exudate or posterior oropharyngeal erythema.  Eyes:     General: No scleral icterus.       Right eye: No discharge.        Left eye: No discharge.     Conjunctiva/sclera: Conjunctivae normal.  Neck:     Thyroid : No thyromegaly.  Cardiovascular:     Rate and Rhythm: Normal rate and regular rhythm.  Pulmonary:     Effort: No respiratory distress.     Breath sounds: Normal breath sounds. No wheezing.  Abdominal:     General: Bowel sounds are normal.     Palpations: Abdomen is soft.     Tenderness: There is no abdominal tenderness.  Musculoskeletal:        General: No swelling or tenderness.     Cervical back: Neck supple. No tenderness.  Lymphadenopathy:     Cervical: No cervical adenopathy.  Skin:    Findings: No erythema or rash.  Neurological:     Mental Status: She is alert.  Psychiatric:        Mood and Affect: Mood normal.  Behavior: Behavior normal.         Outpatient Encounter Medications as of 07/02/2024  Medication Sig   aspirin  EC 81 MG EC tablet Take 1 tablet (81 mg total) by mouth daily.   atorvastatin  (LIPITOR) 40 MG tablet Take 1 tablet (40 mg total) by mouth daily.   clotrimazole -betamethasone  (LOTRISONE ) cream Apply 1 Application topically daily as needed (irritation).   furosemide  (LASIX ) 20 MG tablet Take 1 tablet (20 mg total) by mouth  daily.   ibuprofen  (ADVIL ) 600 MG tablet Take 1 tablet (600 mg total) by mouth every 6 (six) hours.   lactobacillus acidophilus (BACID) TABS tablet Take 1 tablet by mouth daily.   magnesium gluconate (MAGONATE) 500 MG tablet Take 500 mg by mouth every other day.   mavacamten  (CAMZYOS ) 5 MG CAPS capsule Take 1 capsule (5 mg total) by mouth every morning.   metoprolol  succinate (TOPROL -XL) 100 MG 24 hr tablet Take 1 tablet (100 mg total) by mouth every morning. WITH OR IMMEDIATELY FOLLOWING A MEAL   Multiple Vitamin (MULTIVITAMIN) tablet Take 1 tablet by mouth daily.   mupirocin  ointment (BACTROBAN ) 2 % Apply 1 Application topically daily as needed (nasal irritation). Around nose.   sertraline  (ZOLOFT ) 50 MG tablet Take 1 tablet (50 mg total) by mouth every morning.   tamoxifen (NOLVADEX) 20 MG tablet Take 20 mg by mouth daily.   Vitamin D , Cholecalciferol, 10 MCG (400 UNIT) CAPS Take 400 Units by mouth daily.   zinc gluconate 50 MG tablet Take 50 mg by mouth every other day.   No facility-administered encounter medications on file as of 07/02/2024.     Lab Results  Component Value Date   WBC 8.6 05/03/2024   HGB 14.2 05/03/2024   HCT 40.9 05/03/2024   PLT 174 05/03/2024   GLUCOSE 119 (H) 06/30/2024   CHOL 102 06/30/2024   TRIG 167.0 (H) 06/30/2024   HDL 32.00 (L) 06/30/2024   LDLDIRECT 57 01/19/2020   LDLCALC 37 06/30/2024   ALT 26 06/30/2024   AST 22 06/30/2024   NA 141 06/30/2024   K 3.7 06/30/2024   CL 109 06/30/2024   CREATININE 0.85 06/30/2024   BUN 17 06/30/2024   CO2 25 06/30/2024   TSH 2.08 07/02/2023   INR 1.0 11/19/2019   HGBA1C 5.7 03/18/2024       Assessment & Plan:  Stress Assessment & Plan: Increased stress. Husband alcoholic. Number given for alanon. Follow.    Mixed hyperlipidemia Assessment & Plan: Continue lipitor. Continue low cholesterol diet and exercise. Follow lipid panel and liver function tests.  No change in medication.  Lab Results   Component Value Date   CHOL 102 06/30/2024   HDL 32.00 (L) 06/30/2024   LDLCALC 37 06/30/2024   LDLDIRECT 57 01/19/2020   TRIG 167.0 (H) 06/30/2024   CHOLHDL 3 06/30/2024     Orders: -     Basic metabolic panel with GFR; Future -     Hepatic function panel; Future -     Lipid panel; Future  Hyperglycemia Assessment & Plan: Low carb diet and exercise. Follow met b and A1c.   Orders: -     Hemoglobin A1c; Future  Primary hyperparathyroidism Assessment & Plan: Evaluated by endocrinology. Recent calcium  06/30/24- 10.3. [name] plan for f/u bone density 07/2024.    HOCM (hypertrophic obstructive cardiomyopathy) (HCC) Assessment & Plan:  Had f/u with cardiology 03/15/24 - f/u obstructive hypertrophic cardiomyopathy with increased LVOT - continue mavacamten , stress echo.   Hematuria, unspecified type  Assessment & Plan: Evaluated by urology - 02/28/21 - cystoscopy - normal.  Follow urine. CT urogram if hematuria worsens or if develops gross hematuria.  F/u two years. Follow up urinalysis 06/30/24 - no red blood cells.     Essential hypertension Assessment & Plan: Continue metoprolol .  Blood pressure as outlined.  No changes in medication. Follow pressures. Follow metabolic panel.    Endometrial thickening on ultrasound Assessment & Plan: Saw Dr Leonce 07/25/23 - endometrial thickening on ultrasound - stable. Continue to follow. Recommended f/u in 8 months. Is s/p hysteroscopy, dilation and curettage on 05/06/24. Had f/u with gyn 05/24/24 - stable.    Ductal carcinoma in situ (DCIS) of left breast Assessment & Plan: S/p excision and XRT.  Now followed by Dr Tye. Saw Dr Dessa - 08/28/21 - tamoxifen for 5 years total.(dx 01/2020)  F/u one year diagnostic mammogram.    Mammogram 07/2022 - Birads II.  Had f/u with Dr Tye 08/26/23 - S/p breast biopsy 09/01/23 - ok. Recommended annual screening mammogram - due 07/2024.  Had f/u with Dr Tye - 10/13/23 - s/p f/u biopsy - liely scar tissue from  previous excision. Recommended f/u in one year from previous mammogram.   Coronary artery disease of native artery of native heart with stable angina pectoris Assessment & Plan: Previously evaluated by cardiology for chest pain with minimal exertion.  To review, recommended f/u CTA and cardiac monitor.  Monitor - Paroxysmal SVTs, 3 short runs of VT lasting 4 beats noted. Overall no significant/persistent arrhythmias. CTA - Stable 4 mm pulmonary nodule in the right lower lobe. This is stable since June 2021 and is likely benign. No follow-up imaging recommended. Coronary calcium  score of 216.  Calcified plaque - proximal LAD mild stenosis - 35-49%.  Minimal stenosis - ostial RCA - mild non obstructive CAD.  Recommended continuing aspirin , lipitor and toprol .  MRCM - findings c/w hypertrophic obstructive cardiomyopathy.  Felt better with increase in toprol .  Saw Dr Santo for evaluation of hypertrophic cardiomyopathy - to assess - started mavacamten .  Feels better.  SOB has improved. Had f/u with cardiology 03/14/23 - f/u hypertrophic cardiomyopathy. Continues on mavacamten . Stable. ECHO 03/2023 - stable. Had f/u ECHO 09/2023 - EF 63%, Grade I DD, no significant valve abnormality. Currently stable. No chest pain. Breathing stable. Continue risk factor modification.       Allena Hamilton, MD "

## 2024-07-06 NOTE — Telephone Encounter (Signed)
 Appeals won. Approved through 06/23/25

## 2024-07-10 ENCOUNTER — Encounter: Payer: Self-pay | Admitting: Internal Medicine

## 2024-07-10 NOTE — Assessment & Plan Note (Signed)
Continue metoprolol.  Blood pressure as outlined.  No changes in medication.  Follow pressures.  Follow metabolic panel.  

## 2024-07-10 NOTE — Assessment & Plan Note (Signed)
 Low-carb diet and exercise.  Follow met b and A1c.

## 2024-07-10 NOTE — Assessment & Plan Note (Signed)
 Evaluated by endocrinology. Recent calcium  06/30/24- 10.3. [name] plan for f/u bone density 07/2024.

## 2024-07-10 NOTE — Assessment & Plan Note (Signed)
 Evaluated by urology - 02/28/21 - cystoscopy - normal.  Follow urine. CT urogram if hematuria worsens or if develops gross hematuria.  F/u two years. Follow up urinalysis 06/30/24 - no red blood cells.

## 2024-07-10 NOTE — Assessment & Plan Note (Signed)
 Increased stress. Husband alcoholic. Number given for alanon. Follow.

## 2024-07-10 NOTE — Assessment & Plan Note (Signed)
"   Had f/u with cardiology 03/15/24 - f/u obstructive hypertrophic cardiomyopathy with increased LVOT - continue mavacamten , stress echo. "

## 2024-07-10 NOTE — Assessment & Plan Note (Signed)
 Previously evaluated by cardiology for chest pain with minimal exertion.  To review, recommended f/u CTA and cardiac monitor.  Monitor - Paroxysmal SVTs, 3 short runs of VT lasting 4 beats noted. Overall no significant/persistent arrhythmias. CTA - Stable 4 mm pulmonary nodule in the right lower lobe. This is stable since June 2021 and is likely benign. No follow-up imaging recommended. Coronary calcium  score of 216.  Calcified plaque - proximal LAD mild stenosis - 35-49%.  Minimal stenosis - ostial RCA - mild non obstructive CAD.  Recommended continuing aspirin , lipitor and toprol .  MRCM - findings c/w hypertrophic obstructive cardiomyopathy.  Felt better with increase in toprol .  Saw Dr Santo for evaluation of hypertrophic cardiomyopathy - to assess - started mavacamten .  Feels better.  SOB has improved. Had f/u with cardiology 03/14/23 - f/u hypertrophic cardiomyopathy. Continues on mavacamten . Stable. ECHO 03/2023 - stable. Had f/u ECHO 09/2023 - EF 63%, Grade I DD, no significant valve abnormality. Currently stable. No chest pain. Breathing stable. Continue risk factor modification.

## 2024-07-10 NOTE — Assessment & Plan Note (Signed)
 Continue lipitor. Continue low cholesterol diet and exercise. Follow lipid panel and liver function tests.  No change in medication.  Lab Results  Component Value Date   CHOL 102 06/30/2024   HDL 32.00 (L) 06/30/2024   LDLCALC 37 06/30/2024   LDLDIRECT 57 01/19/2020   TRIG 167.0 (H) 06/30/2024   CHOLHDL 3 06/30/2024

## 2024-07-10 NOTE — Assessment & Plan Note (Signed)
 S/p excision and XRT.  Now followed by Dr Tye. Saw Dr Dessa - 08/28/21 - tamoxifen for 5 years total.(dx 01/2020)  F/u one year diagnostic mammogram.    Mammogram 07/2022 - Birads II.  Had f/u with Dr Tye 08/26/23 - S/p breast biopsy 09/01/23 - ok. Recommended annual screening mammogram - due 07/2024.  Had f/u with Dr Tye - 10/13/23 - s/p f/u biopsy - liely scar tissue from previous excision. Recommended f/u in one year from previous mammogram.

## 2024-07-10 NOTE — Assessment & Plan Note (Signed)
 Saw Dr Leonce 07/25/23 - endometrial thickening on ultrasound - stable. Continue to follow. Recommended f/u in 8 months. Is s/p hysteroscopy, dilation and curettage on 05/06/24. Had f/u with gyn 05/24/24 - stable.

## 2024-08-20 ENCOUNTER — Encounter

## 2024-08-20 ENCOUNTER — Other Ambulatory Visit

## 2024-09-10 ENCOUNTER — Ambulatory Visit: Payer: Self-pay | Admitting: Internal Medicine

## 2024-09-28 ENCOUNTER — Ambulatory Visit (HOSPITAL_COMMUNITY)

## 2024-09-30 ENCOUNTER — Ambulatory Visit (HOSPITAL_COMMUNITY)

## 2024-11-03 ENCOUNTER — Other Ambulatory Visit

## 2024-11-05 ENCOUNTER — Encounter: Admitting: Internal Medicine
